# Patient Record
Sex: Female | Born: 1940
Health system: Southern US, Community
[De-identification: ages and names within clinical notes are randomized; demographics above are authoritative.]

## PROBLEM LIST (undated history)

## (undated) DIAGNOSIS — C189 Malignant neoplasm of colon, unspecified: Secondary | ICD-10-CM

## (undated) DIAGNOSIS — R6510 Systemic inflammatory response syndrome (SIRS) of non-infectious origin without acute organ dysfunction: Secondary | ICD-10-CM

## (undated) DIAGNOSIS — I10 Essential (primary) hypertension: Secondary | ICD-10-CM

## (undated) DIAGNOSIS — I82409 Acute embolism and thrombosis of unspecified deep veins of unspecified lower extremity: Secondary | ICD-10-CM

## (undated) DIAGNOSIS — M1990 Unspecified osteoarthritis, unspecified site: Secondary | ICD-10-CM

## (undated) DIAGNOSIS — E785 Hyperlipidemia, unspecified: Secondary | ICD-10-CM

## (undated) DIAGNOSIS — I639 Cerebral infarction, unspecified: Secondary | ICD-10-CM

## (undated) DIAGNOSIS — E119 Type 2 diabetes mellitus without complications: Secondary | ICD-10-CM

## (undated) DIAGNOSIS — N390 Urinary tract infection, site not specified: Secondary | ICD-10-CM

## (undated) HISTORY — PX: COLONOSCOPY: SHX174

## (undated) HISTORY — PX: ABDOMINAL HYSTERECTOMY: SHX81

## (undated) HISTORY — PX: COLON SURGERY: SHX602

## (undated) HISTORY — PX: APPENDECTOMY: SHX54

## (undated) HISTORY — DX: Malignant neoplasm of colon, unspecified: C18.9

## (undated) HISTORY — DX: Unspecified osteoarthritis, unspecified site: M19.90

## (undated) HISTORY — PX: LEG SURGERY: SHX1003

## (undated) HISTORY — PX: CATARACT EXTRACTION: SUR2

---

## 1998-10-02 ENCOUNTER — Other Ambulatory Visit: Admission: RE | Admit: 1998-10-02 | Discharge: 1998-10-02 | Payer: Self-pay | Admitting: Gynecology

## 1998-10-31 ENCOUNTER — Emergency Department (HOSPITAL_COMMUNITY): Admission: EM | Admit: 1998-10-31 | Discharge: 1998-10-31 | Payer: Self-pay | Admitting: Emergency Medicine

## 2000-06-04 ENCOUNTER — Encounter: Admission: RE | Admit: 2000-06-04 | Discharge: 2000-06-04 | Payer: Self-pay | Admitting: Internal Medicine

## 2000-06-04 ENCOUNTER — Encounter: Payer: Self-pay | Admitting: Internal Medicine

## 2001-08-04 ENCOUNTER — Encounter: Payer: Self-pay | Admitting: Internal Medicine

## 2001-08-04 ENCOUNTER — Encounter: Admission: RE | Admit: 2001-08-04 | Discharge: 2001-08-04 | Payer: Self-pay | Admitting: Internal Medicine

## 2001-09-14 ENCOUNTER — Other Ambulatory Visit: Admission: RE | Admit: 2001-09-14 | Discharge: 2001-09-14 | Payer: Self-pay | Admitting: Obstetrics and Gynecology

## 2003-03-09 ENCOUNTER — Encounter: Admission: RE | Admit: 2003-03-09 | Discharge: 2003-03-09 | Payer: Self-pay | Admitting: Obstetrics and Gynecology

## 2003-03-09 ENCOUNTER — Encounter: Payer: Self-pay | Admitting: Internal Medicine

## 2004-03-22 ENCOUNTER — Other Ambulatory Visit: Admission: RE | Admit: 2004-03-22 | Discharge: 2004-03-22 | Payer: Self-pay | Admitting: Obstetrics and Gynecology

## 2004-06-19 ENCOUNTER — Encounter: Admission: RE | Admit: 2004-06-19 | Discharge: 2004-06-19 | Payer: Self-pay | Admitting: Obstetrics and Gynecology

## 2005-07-07 ENCOUNTER — Emergency Department (HOSPITAL_COMMUNITY): Admission: EM | Admit: 2005-07-07 | Discharge: 2005-07-07 | Payer: Self-pay | Admitting: Emergency Medicine

## 2005-07-19 ENCOUNTER — Encounter: Admission: RE | Admit: 2005-07-19 | Discharge: 2005-07-19 | Payer: Self-pay | Admitting: *Deleted

## 2005-07-23 ENCOUNTER — Other Ambulatory Visit: Admission: RE | Admit: 2005-07-23 | Discharge: 2005-07-23 | Payer: Self-pay | Admitting: Obstetrics and Gynecology

## 2005-07-23 ENCOUNTER — Encounter: Admission: RE | Admit: 2005-07-23 | Discharge: 2005-07-23 | Payer: Self-pay | Admitting: Obstetrics and Gynecology

## 2006-09-05 ENCOUNTER — Other Ambulatory Visit: Admission: RE | Admit: 2006-09-05 | Discharge: 2006-09-05 | Payer: Self-pay | Admitting: Obstetrics and Gynecology

## 2006-09-05 ENCOUNTER — Encounter: Admission: RE | Admit: 2006-09-05 | Discharge: 2006-09-05 | Payer: Self-pay | Admitting: Obstetrics and Gynecology

## 2007-10-07 ENCOUNTER — Encounter: Admission: RE | Admit: 2007-10-07 | Discharge: 2007-10-07 | Payer: Self-pay | Admitting: Obstetrics and Gynecology

## 2007-11-12 ENCOUNTER — Other Ambulatory Visit: Admission: RE | Admit: 2007-11-12 | Discharge: 2007-11-12 | Payer: Self-pay | Admitting: Obstetrics and Gynecology

## 2007-11-26 DIAGNOSIS — C189 Malignant neoplasm of colon, unspecified: Secondary | ICD-10-CM

## 2007-11-26 HISTORY — DX: Malignant neoplasm of colon, unspecified: C18.9

## 2007-11-26 HISTORY — PX: COLON RESECTION: SHX5231

## 2008-08-04 ENCOUNTER — Encounter: Admission: RE | Admit: 2008-08-04 | Discharge: 2008-08-04 | Payer: Self-pay | Admitting: Gastroenterology

## 2008-08-13 ENCOUNTER — Encounter: Admission: RE | Admit: 2008-08-13 | Discharge: 2008-08-13 | Payer: Self-pay | Admitting: Surgery

## 2008-09-22 ENCOUNTER — Encounter (INDEPENDENT_AMBULATORY_CARE_PROVIDER_SITE_OTHER): Payer: Self-pay | Admitting: Surgery

## 2008-09-22 ENCOUNTER — Inpatient Hospital Stay (HOSPITAL_COMMUNITY): Admission: RE | Admit: 2008-09-22 | Discharge: 2008-09-25 | Payer: Self-pay | Admitting: Surgery

## 2008-10-05 ENCOUNTER — Ambulatory Visit: Payer: Self-pay | Admitting: Hematology and Oncology

## 2008-10-11 LAB — CBC WITH DIFFERENTIAL/PLATELET
BASO%: 0.9 % (ref 0.0–2.0)
HCT: 30.2 % — ABNORMAL LOW (ref 34.8–46.6)
LYMPH%: 33.4 % (ref 14.0–48.0)
MCH: 24.4 pg — ABNORMAL LOW (ref 26.0–34.0)
MCHC: 32.6 g/dL (ref 32.0–36.0)
MCV: 74.8 fL — ABNORMAL LOW (ref 81.0–101.0)
MONO#: 0.3 10*3/uL (ref 0.1–0.9)
NEUT%: 54.9 % (ref 39.6–76.8)
Platelets: 450 10*3/uL — ABNORMAL HIGH (ref 145–400)
WBC: 4.7 10*3/uL (ref 3.9–10.0)

## 2008-10-12 LAB — COMPREHENSIVE METABOLIC PANEL
Alkaline Phosphatase: 59 U/L (ref 39–117)
BUN: 8 mg/dL (ref 6–23)
CO2: 23 mEq/L (ref 19–32)
Creatinine, Ser: 0.69 mg/dL (ref 0.40–1.20)
Glucose, Bld: 93 mg/dL (ref 70–99)
Sodium: 140 mEq/L (ref 135–145)
Total Bilirubin: 0.4 mg/dL (ref 0.3–1.2)

## 2008-10-13 ENCOUNTER — Ambulatory Visit (HOSPITAL_COMMUNITY): Admission: RE | Admit: 2008-10-13 | Discharge: 2008-10-13 | Payer: Self-pay | Admitting: Hematology and Oncology

## 2008-10-25 ENCOUNTER — Ambulatory Visit (HOSPITAL_BASED_OUTPATIENT_CLINIC_OR_DEPARTMENT_OTHER): Admission: RE | Admit: 2008-10-25 | Discharge: 2008-10-25 | Payer: Self-pay | Admitting: Surgery

## 2008-11-09 LAB — CBC WITH DIFFERENTIAL/PLATELET
BASO%: 0.5 % (ref 0.0–2.0)
EOS%: 1.9 % (ref 0.0–7.0)
HCT: 30 % — ABNORMAL LOW (ref 34.8–46.6)
LYMPH%: 32 % (ref 14.0–48.0)
MCH: 25 pg — ABNORMAL LOW (ref 26.0–34.0)
MCHC: 32.6 g/dL (ref 32.0–36.0)
MONO%: 7.8 % (ref 0.0–13.0)
NEUT%: 57.8 % (ref 39.6–76.8)
Platelets: 239 10*3/uL (ref 145–400)
RBC: 3.91 10*6/uL (ref 3.70–5.32)

## 2008-11-09 LAB — COMPREHENSIVE METABOLIC PANEL
ALT: 8 U/L (ref 0–35)
AST: 9 U/L (ref 0–37)
Alkaline Phosphatase: 81 U/L (ref 39–117)
Creatinine, Ser: 0.74 mg/dL (ref 0.40–1.20)
Total Bilirubin: 0.6 mg/dL (ref 0.3–1.2)

## 2008-11-28 ENCOUNTER — Ambulatory Visit: Payer: Self-pay | Admitting: Hematology and Oncology

## 2008-11-30 LAB — COMPREHENSIVE METABOLIC PANEL
ALT: 11 U/L (ref 0–35)
AST: 13 U/L (ref 0–37)
Calcium: 9.9 mg/dL (ref 8.4–10.5)
Chloride: 107 mEq/L (ref 96–112)
Creatinine, Ser: 0.77 mg/dL (ref 0.40–1.20)
Potassium: 4.1 mEq/L (ref 3.5–5.3)
Sodium: 142 mEq/L (ref 135–145)
Total Protein: 6.8 g/dL (ref 6.0–8.3)

## 2008-11-30 LAB — CBC WITH DIFFERENTIAL/PLATELET
BASO%: 0.3 % (ref 0.0–2.0)
EOS%: 1.4 % (ref 0.0–7.0)
MCH: 26.8 pg (ref 26.0–34.0)
MCHC: 33.5 g/dL (ref 32.0–36.0)
RBC: 3.82 10*6/uL (ref 3.70–5.32)
RDW: 23.1 % — ABNORMAL HIGH (ref 11.3–14.5)
lymph#: 1.5 10*3/uL (ref 0.9–3.3)

## 2008-12-21 LAB — COMPREHENSIVE METABOLIC PANEL
ALT: 22 U/L (ref 0–35)
AST: 18 U/L (ref 0–37)
Chloride: 105 mEq/L (ref 96–112)
Creatinine, Ser: 0.66 mg/dL (ref 0.40–1.20)
Sodium: 141 mEq/L (ref 135–145)
Total Bilirubin: 0.8 mg/dL (ref 0.3–1.2)
Total Protein: 6.8 g/dL (ref 6.0–8.3)

## 2008-12-21 LAB — CBC WITH DIFFERENTIAL/PLATELET
BASO%: 0.4 % (ref 0.0–2.0)
EOS%: 1.8 % (ref 0.0–7.0)
HCT: 33 % — ABNORMAL LOW (ref 34.8–46.6)
MCH: 28.2 pg (ref 26.0–34.0)
MCHC: 33.5 g/dL (ref 32.0–36.0)
MONO#: 0.4 10*3/uL (ref 0.1–0.9)
NEUT%: 45.9 % (ref 39.6–76.8)
RBC: 3.92 10*6/uL (ref 3.70–5.32)
WBC: 3.4 10*3/uL — ABNORMAL LOW (ref 3.9–10.0)
lymph#: 1.4 10*3/uL (ref 0.9–3.3)

## 2009-01-11 LAB — CBC WITH DIFFERENTIAL/PLATELET
BASO%: 0.6 % (ref 0.0–2.0)
Basophils Absolute: 0 10*3/uL (ref 0.0–0.1)
EOS%: 1.6 % (ref 0.0–7.0)
HGB: 10.9 g/dL — ABNORMAL LOW (ref 11.6–15.9)
MCH: 29.4 pg (ref 26.0–34.0)
MCHC: 34 g/dL (ref 32.0–36.0)
MONO#: 0.4 10*3/uL (ref 0.1–0.9)
RDW: 27.1 % — ABNORMAL HIGH (ref 11.3–14.5)
WBC: 3.6 10*3/uL — ABNORMAL LOW (ref 3.9–10.0)
lymph#: 1.4 10*3/uL (ref 0.9–3.3)

## 2009-01-11 LAB — COMPREHENSIVE METABOLIC PANEL
ALT: 39 U/L — ABNORMAL HIGH (ref 0–35)
AST: 27 U/L (ref 0–37)
Albumin: 4.1 g/dL (ref 3.5–5.2)
CO2: 28 mEq/L (ref 19–32)
Calcium: 10.3 mg/dL (ref 8.4–10.5)
Chloride: 106 mEq/L (ref 96–112)
Potassium: 4.1 mEq/L (ref 3.5–5.3)

## 2009-01-30 ENCOUNTER — Ambulatory Visit: Payer: Self-pay | Admitting: Hematology and Oncology

## 2009-02-01 LAB — COMPREHENSIVE METABOLIC PANEL
AST: 79 U/L — ABNORMAL HIGH (ref 0–37)
Albumin: 4.3 g/dL (ref 3.5–5.2)
BUN: 11 mg/dL (ref 6–23)
Calcium: 10.1 mg/dL (ref 8.4–10.5)
Chloride: 104 mEq/L (ref 96–112)
Creatinine, Ser: 0.63 mg/dL (ref 0.40–1.20)
Glucose, Bld: 184 mg/dL — ABNORMAL HIGH (ref 70–99)
Potassium: 3.9 mEq/L (ref 3.5–5.3)

## 2009-02-01 LAB — CBC WITH DIFFERENTIAL/PLATELET
Basophils Absolute: 0 10*3/uL (ref 0.0–0.1)
EOS%: 2.1 % (ref 0.0–7.0)
Eosinophils Absolute: 0.1 10*3/uL (ref 0.0–0.5)
HCT: 34.6 % — ABNORMAL LOW (ref 34.8–46.6)
HGB: 11.7 g/dL (ref 11.6–15.9)
MCH: 30.4 pg (ref 25.1–34.0)
MCV: 89.5 fL (ref 79.5–101.0)
NEUT#: 1.7 10*3/uL (ref 1.5–6.5)
NEUT%: 45.2 % (ref 38.4–76.8)
RDW: 27.4 % — ABNORMAL HIGH (ref 11.2–14.5)
lymph#: 1.5 10*3/uL (ref 0.9–3.3)

## 2009-02-07 ENCOUNTER — Encounter: Admission: RE | Admit: 2009-02-07 | Discharge: 2009-02-07 | Payer: Self-pay | Admitting: Obstetrics and Gynecology

## 2009-02-22 LAB — COMPREHENSIVE METABOLIC PANEL
AST: 72 U/L — ABNORMAL HIGH (ref 0–37)
Albumin: 4.2 g/dL (ref 3.5–5.2)
BUN: 8 mg/dL (ref 6–23)
CO2: 28 mEq/L (ref 19–32)
Calcium: 10.2 mg/dL (ref 8.4–10.5)
Chloride: 107 mEq/L (ref 96–112)
Creatinine, Ser: 0.65 mg/dL (ref 0.40–1.20)
Potassium: 4.1 mEq/L (ref 3.5–5.3)

## 2009-02-22 LAB — CBC WITH DIFFERENTIAL/PLATELET
Basophils Absolute: 0 10*3/uL (ref 0.0–0.1)
EOS%: 2.2 % (ref 0.0–7.0)
Eosinophils Absolute: 0.1 10*3/uL (ref 0.0–0.5)
HCT: 32.4 % — ABNORMAL LOW (ref 34.8–46.6)
HGB: 10.8 g/dL — ABNORMAL LOW (ref 11.6–15.9)
MCH: 30.6 pg (ref 25.1–34.0)
MONO#: 0.5 10*3/uL (ref 0.1–0.9)
NEUT#: 1.8 10*3/uL (ref 1.5–6.5)
NEUT%: 48 % (ref 38.4–76.8)
RDW: 24.9 % — ABNORMAL HIGH (ref 11.2–14.5)
WBC: 3.7 10*3/uL — ABNORMAL LOW (ref 3.9–10.3)
lymph#: 1.4 10*3/uL (ref 0.9–3.3)

## 2009-03-10 LAB — CBC WITH DIFFERENTIAL/PLATELET
Basophils Absolute: 0 10*3/uL (ref 0.0–0.1)
EOS%: 1.3 % (ref 0.0–7.0)
Eosinophils Absolute: 0 10*3/uL (ref 0.0–0.5)
HCT: 32.1 % — ABNORMAL LOW (ref 34.8–46.6)
HGB: 10.9 g/dL — ABNORMAL LOW (ref 11.6–15.9)
MCH: 32.2 pg (ref 25.1–34.0)
MCV: 94.4 fL (ref 79.5–101.0)
MONO%: 10.9 % (ref 0.0–14.0)
NEUT%: 47.6 % (ref 38.4–76.8)
Platelets: 163 10*3/uL (ref 145–400)

## 2009-03-10 LAB — COMPREHENSIVE METABOLIC PANEL
AST: 32 U/L (ref 0–37)
Alkaline Phosphatase: 83 U/L (ref 39–117)
BUN: 8 mg/dL (ref 6–23)
Calcium: 10.6 mg/dL — ABNORMAL HIGH (ref 8.4–10.5)
Creatinine, Ser: 0.65 mg/dL (ref 0.40–1.20)
Glucose, Bld: 155 mg/dL — ABNORMAL HIGH (ref 70–99)

## 2009-04-03 ENCOUNTER — Ambulatory Visit: Payer: Self-pay | Admitting: Hematology and Oncology

## 2009-04-05 LAB — CBC WITH DIFFERENTIAL/PLATELET
Basophils Absolute: 0 10*3/uL (ref 0.0–0.1)
EOS%: 1.2 % (ref 0.0–7.0)
HGB: 11.5 g/dL — ABNORMAL LOW (ref 11.6–15.9)
LYMPH%: 38.8 % (ref 14.0–49.7)
MCH: 33.5 pg (ref 25.1–34.0)
MCV: 97.5 fL (ref 79.5–101.0)
MONO%: 13.3 % (ref 0.0–14.0)
NEUT%: 46.4 % (ref 38.4–76.8)
RDW: 20.9 % — ABNORMAL HIGH (ref 11.2–14.5)

## 2009-04-05 LAB — COMPREHENSIVE METABOLIC PANEL
AST: 30 U/L (ref 0–37)
Alkaline Phosphatase: 107 U/L (ref 39–117)
BUN: 11 mg/dL (ref 6–23)
Creatinine, Ser: 0.67 mg/dL (ref 0.40–1.20)
Potassium: 3.8 mEq/L (ref 3.5–5.3)
Total Bilirubin: 0.6 mg/dL (ref 0.3–1.2)

## 2009-05-03 ENCOUNTER — Ambulatory Visit (HOSPITAL_COMMUNITY): Admission: RE | Admit: 2009-05-03 | Discharge: 2009-05-03 | Payer: Self-pay | Admitting: Hematology and Oncology

## 2009-05-03 LAB — CBC WITH DIFFERENTIAL/PLATELET
BASO%: 0.3 % (ref 0.0–2.0)
MCHC: 34.5 g/dL (ref 31.5–36.0)
MONO#: 0.4 10*3/uL (ref 0.1–0.9)
RBC: 3.48 10*6/uL — ABNORMAL LOW (ref 3.70–5.45)
WBC: 3.6 10*3/uL — ABNORMAL LOW (ref 3.9–10.3)
lymph#: 1.6 10*3/uL (ref 0.9–3.3)
nRBC: 0 % (ref 0–0)

## 2009-05-03 LAB — COMPREHENSIVE METABOLIC PANEL
ALT: 26 U/L (ref 0–35)
AST: 21 U/L (ref 0–37)
Albumin: 2.8 g/dL — ABNORMAL LOW (ref 3.5–5.2)
Calcium: 7 mg/dL — ABNORMAL LOW (ref 8.4–10.5)
Chloride: 118 mEq/L — ABNORMAL HIGH (ref 96–112)
Creatinine, Ser: 0.45 mg/dL (ref 0.40–1.20)
Potassium: 2.9 mEq/L — ABNORMAL LOW (ref 3.5–5.3)

## 2009-06-13 ENCOUNTER — Other Ambulatory Visit: Admission: RE | Admit: 2009-06-13 | Discharge: 2009-06-13 | Payer: Self-pay | Admitting: Obstetrics and Gynecology

## 2009-06-27 ENCOUNTER — Ambulatory Visit: Payer: Self-pay | Admitting: Hematology and Oncology

## 2009-08-22 ENCOUNTER — Ambulatory Visit: Payer: Self-pay | Admitting: Hematology and Oncology

## 2009-08-24 LAB — CBC WITH DIFFERENTIAL/PLATELET
Eosinophils Absolute: 0.1 10*3/uL (ref 0.0–0.5)
HGB: 12.3 g/dL (ref 11.6–15.9)
LYMPH%: 35.7 % (ref 14.0–49.7)
MONO#: 0.3 10*3/uL (ref 0.1–0.9)
NEUT#: 2.5 10*3/uL (ref 1.5–6.5)
Platelets: 160 10*3/uL (ref 145–400)
RBC: 4.14 10*6/uL (ref 3.70–5.45)
RDW: 12.9 % (ref 11.2–14.5)
WBC: 4.5 10*3/uL (ref 3.9–10.3)

## 2009-08-24 LAB — CEA: CEA: 2.6 ng/mL (ref 0.0–5.0)

## 2009-08-24 LAB — COMPREHENSIVE METABOLIC PANEL
Albumin: 4.2 g/dL (ref 3.5–5.2)
CO2: 25 mEq/L (ref 19–32)
Glucose, Bld: 241 mg/dL — ABNORMAL HIGH (ref 70–99)
Potassium: 4 mEq/L (ref 3.5–5.3)
Sodium: 138 mEq/L (ref 135–145)
Total Protein: 6.8 g/dL (ref 6.0–8.3)

## 2009-08-31 ENCOUNTER — Ambulatory Visit (HOSPITAL_COMMUNITY): Admission: RE | Admit: 2009-08-31 | Discharge: 2009-08-31 | Payer: Self-pay | Admitting: Hematology and Oncology

## 2009-10-03 ENCOUNTER — Ambulatory Visit: Payer: Self-pay | Admitting: Hematology and Oncology

## 2009-11-14 ENCOUNTER — Ambulatory Visit: Payer: Self-pay | Admitting: Oncology

## 2009-12-04 ENCOUNTER — Ambulatory Visit (HOSPITAL_COMMUNITY): Admission: RE | Admit: 2009-12-04 | Discharge: 2009-12-04 | Payer: Self-pay | Admitting: Hematology and Oncology

## 2009-12-04 LAB — CBC WITH DIFFERENTIAL/PLATELET
Eosinophils Absolute: 0.1 10*3/uL (ref 0.0–0.5)
MCH: 30.3 pg (ref 25.1–34.0)
MONO#: 0.3 10*3/uL (ref 0.1–0.9)
Platelets: 193 10*3/uL (ref 145–400)
WBC: 4.7 10*3/uL (ref 3.9–10.3)
lymph#: 1.6 10*3/uL (ref 0.9–3.3)

## 2009-12-04 LAB — COMPREHENSIVE METABOLIC PANEL
ALT: 22 U/L (ref 0–35)
AST: 22 U/L (ref 0–37)
Albumin: 3.9 g/dL (ref 3.5–5.2)
Alkaline Phosphatase: 118 U/L — ABNORMAL HIGH (ref 39–117)
Calcium: 10.3 mg/dL (ref 8.4–10.5)
Chloride: 102 mEq/L (ref 96–112)
Creatinine, Ser: 0.69 mg/dL (ref 0.40–1.20)
Glucose, Bld: 287 mg/dL — ABNORMAL HIGH (ref 70–99)
Potassium: 4.1 mEq/L (ref 3.5–5.3)
Sodium: 137 mEq/L (ref 135–145)
Total Bilirubin: 0.8 mg/dL (ref 0.3–1.2)

## 2009-12-04 LAB — CEA: CEA: 3.2 ng/mL (ref 0.0–5.0)

## 2010-01-15 ENCOUNTER — Ambulatory Visit: Payer: Self-pay | Admitting: Hematology and Oncology

## 2010-02-26 ENCOUNTER — Ambulatory Visit: Payer: Self-pay | Admitting: Hematology and Oncology

## 2010-03-20 LAB — CBC WITH DIFFERENTIAL/PLATELET
Basophils Absolute: 0 10*3/uL (ref 0.0–0.1)
Eosinophils Absolute: 0.1 10*3/uL (ref 0.0–0.5)
HGB: 12.1 g/dL (ref 11.6–15.9)
MCV: 89.3 fL (ref 79.5–101.0)
MONO#: 0.5 10*3/uL (ref 0.1–0.9)
MONO%: 9.3 % (ref 0.0–14.0)
NEUT#: 3.1 10*3/uL (ref 1.5–6.5)
RBC: 3.97 10*6/uL (ref 3.70–5.45)
RDW: 14.2 % (ref 11.2–14.5)
WBC: 5.1 10*3/uL (ref 3.9–10.3)

## 2010-03-20 LAB — COMPREHENSIVE METABOLIC PANEL
BUN: 15 mg/dL (ref 6–23)
CO2: 27 mEq/L (ref 19–32)
Chloride: 105 mEq/L (ref 96–112)
Glucose, Bld: 107 mg/dL — ABNORMAL HIGH (ref 70–99)
Potassium: 4.3 mEq/L (ref 3.5–5.3)
Sodium: 139 mEq/L (ref 135–145)
Total Protein: 7 g/dL (ref 6.0–8.3)

## 2010-04-10 ENCOUNTER — Ambulatory Visit: Payer: Self-pay | Admitting: Hematology and Oncology

## 2010-04-12 ENCOUNTER — Encounter: Admission: RE | Admit: 2010-04-12 | Discharge: 2010-04-12 | Payer: Self-pay | Admitting: Obstetrics and Gynecology

## 2010-06-06 ENCOUNTER — Ambulatory Visit: Payer: Self-pay | Admitting: Hematology and Oncology

## 2010-07-26 ENCOUNTER — Ambulatory Visit: Payer: Self-pay | Admitting: Hematology and Oncology

## 2010-07-31 ENCOUNTER — Ambulatory Visit (HOSPITAL_COMMUNITY): Admission: RE | Admit: 2010-07-31 | Discharge: 2010-07-31 | Payer: Self-pay | Admitting: Hematology and Oncology

## 2010-07-31 LAB — CBC WITH DIFFERENTIAL/PLATELET
EOS%: 1.7 % (ref 0.0–7.0)
Eosinophils Absolute: 0.1 10*3/uL (ref 0.0–0.5)
HCT: 38.1 % (ref 34.8–46.6)
MCH: 29 pg (ref 25.1–34.0)
MONO%: 6.9 % (ref 0.0–14.0)
NEUT%: 59.8 % (ref 38.4–76.8)
Platelets: 214 10*3/uL (ref 145–400)
WBC: 5.1 10*3/uL (ref 3.9–10.3)
lymph#: 1.6 10*3/uL (ref 0.9–3.3)

## 2010-07-31 LAB — COMPREHENSIVE METABOLIC PANEL
ALT: 19 U/L (ref 0–35)
AST: 20 U/L (ref 0–37)
Albumin: 3.9 g/dL (ref 3.5–5.2)
CO2: 29 mEq/L (ref 19–32)
Chloride: 100 mEq/L (ref 96–112)
Sodium: 135 mEq/L (ref 135–145)
Total Bilirubin: 0.9 mg/dL (ref 0.3–1.2)

## 2010-08-27 ENCOUNTER — Ambulatory Visit (HOSPITAL_COMMUNITY): Admission: RE | Admit: 2010-08-27 | Discharge: 2010-08-27 | Payer: Self-pay | Admitting: Surgery

## 2010-12-16 ENCOUNTER — Encounter: Payer: Self-pay | Admitting: Hematology and Oncology

## 2010-12-16 ENCOUNTER — Encounter: Payer: Self-pay | Admitting: Obstetrics and Gynecology

## 2011-01-30 ENCOUNTER — Encounter (HOSPITAL_BASED_OUTPATIENT_CLINIC_OR_DEPARTMENT_OTHER): Payer: Medicare Other | Admitting: Hematology and Oncology

## 2011-01-30 ENCOUNTER — Other Ambulatory Visit: Payer: Self-pay | Admitting: Hematology and Oncology

## 2011-01-30 DIAGNOSIS — Z452 Encounter for adjustment and management of vascular access device: Secondary | ICD-10-CM

## 2011-01-30 DIAGNOSIS — C187 Malignant neoplasm of sigmoid colon: Secondary | ICD-10-CM

## 2011-01-30 LAB — CBC WITH DIFFERENTIAL/PLATELET
Basophils Absolute: 0.1 10*3/uL (ref 0.0–0.1)
EOS%: 0.9 % (ref 0.0–7.0)
HCT: 37.2 % (ref 34.8–46.6)
HGB: 12.7 g/dL (ref 11.6–15.9)
MCH: 29.3 pg (ref 25.1–34.0)
MCV: 85.8 fL (ref 79.5–101.0)
MONO%: 6.6 % (ref 0.0–14.0)
NEUT%: 60.8 % (ref 38.4–76.8)

## 2011-01-30 LAB — COMPREHENSIVE METABOLIC PANEL
AST: 18 U/L (ref 0–37)
Alkaline Phosphatase: 87 U/L (ref 39–117)
BUN: 12 mg/dL (ref 6–23)
Calcium: 10.5 mg/dL (ref 8.4–10.5)
Chloride: 105 mEq/L (ref 96–112)
Creatinine, Ser: 1.02 mg/dL (ref 0.40–1.20)
Glucose, Bld: 114 mg/dL — ABNORMAL HIGH (ref 70–99)

## 2011-02-06 ENCOUNTER — Other Ambulatory Visit: Payer: Self-pay | Admitting: Hematology and Oncology

## 2011-02-06 ENCOUNTER — Encounter (HOSPITAL_BASED_OUTPATIENT_CLINIC_OR_DEPARTMENT_OTHER): Payer: Medicare Other | Admitting: Hematology and Oncology

## 2011-02-06 DIAGNOSIS — C189 Malignant neoplasm of colon, unspecified: Secondary | ICD-10-CM

## 2011-02-06 DIAGNOSIS — C187 Malignant neoplasm of sigmoid colon: Secondary | ICD-10-CM

## 2011-02-06 LAB — GLUCOSE, CAPILLARY: Glucose-Capillary: 270 mg/dL — ABNORMAL HIGH (ref 70–99)

## 2011-02-07 LAB — BASIC METABOLIC PANEL
Chloride: 105 mEq/L (ref 96–112)
Creatinine, Ser: 0.69 mg/dL (ref 0.4–1.2)
GFR calc Af Amer: >60 mL/min (ref >60)
Potassium: 3.8 mEq/L (ref 3.5–5.1)
Sodium: 137 mEq/L (ref 135–145)

## 2011-02-07 LAB — CBC
HCT: 38.6 % (ref 36.0–46.0)
Hemoglobin: 13 g/dL (ref 12.0–15.0)
MCV: 86 fL (ref 78.0–100.0)
Platelets: 185 10*3/uL (ref 150–400)
RBC: 4.49 MIL/uL (ref 3.87–5.11)
WBC: 5.2 10*3/uL (ref 4.0–10.5)
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
## 2011-04-09 NOTE — Op Note (Signed)
April Franklin, April Franklin               ACCOUNT NO.:  [id number]   MEDICAL RECORD NO.:  [id number]          PATIENT TYPE:  AMB   LOCATION:  DSC                          FACILITY:  [facility]   PHYSICIAN:  Ardeth Sportsman, MD     DATE OF BIRTH:  11-26-1940   DATE OF PROCEDURE:  DATE OF DISCHARGE:                               OPERATIVE REPORT   PRIMARY CARE PHYSICIAN:  Dr. Earl Gala.   GASTROENTEROLOGIST:  Tasia Catchings, M.D.   MEDICAL ONCOLOGIST:  Vicente Serene I. Odogwu, MD   SURGEON:  Ardeth Sportsman, MD.   ASSISTANT:  None.   PREOPERATIVE DIAGNOSIS:  pT2pN0 and pT2pN1 synchronous colon cancers,  need for chemotherapy.   POSTOPERATIVE DIAGNOSIS:  pT2pN0 and pT2pN1 synchronous colon cancers,  need for chemotherapy.   PROCEDURE PERFORMED:  Placement of Port-A-Catheter (8.5 French left  internal jugular vein) using ultrasound and fluoroscopic guidance.   ANESTHESIA:  1. Moderate deep sedation.  2. Local anesthetic and a field block.   SPECIMENS:  None.   DRAINS:  None.   ESTIMATED BLOOD LOSS:  3 mL.   COMPLICATIONS:  None apparent.   INDICATIONS:  April Franklin is a 70 year old female who was found to have 2  colon cancers in her ascending and sigmoid colon.  Sigmoid colon was  node-positive.  She saw Dr. Dalene Carrow, who thought she would benefit from  chemotherapy.  Therefore recommendation was made for placement of Port-A-  Catheter.   The anatomy and physiology of the central vein was discussed.  Technique  of placement was discussed.  Risks, benefits, and alternatives were  discussed.  Questions answered and she agreed to proceed with Port-A-  Catheter placement.   OPERATIVE FINDINGS:  No obvious apparent anatomy discrepancy.   PROCEDURE:  Informed consent was confirmed.  The patient received IV  antibiotics as per her surgery.  She voided just prior to coming to the  operating room.  She was positioned supine, with both arms tucked.  She  had a roll placed between her shoulder  blades.  Her bilateral neck and  chest were prepped and draped in sterile fashion.  She was placed on  moderate sedation.  Surgical time-out was performed.   Left internal jugular vein was easily identified by ultrasonic guidance  with the patient in moderate Trendelenburg position.  Venipuncture was  made on the first attempt.  Wire initially did not pass, but after  redirecting the needle more medially, the wire passed very easily.  There was some ectopy.  Fluoroscopy confirmed the tip was in the right  ventricle.  It was pulled back and advanced into the inferior vena cava.   An infraclavicular pocket was made in the left lateral clavicular line  using a 2-cm transverse incision.  A 8.5 French port was flushed.  It  was tunneled from the chest wound to the neck puncture site.  The port  was secured to the pectoralis fascia on the left chest through the wound  using interrupted Prolene stitch x4.  It flushed well.   A dilator and sheath were placed.  The wire was pulled  back, so the tip  was in the distal superior vena cava.  Intravenous length of this  measured while pulling the wire back from the SVC/RA junction all the  way up to the venipuncture site.  The catheter was cut to appropriate  length.  Wire was readvanced back to the IVC under fluoroscopy.   The wire and dilator were removed and a catheter was placed inside the  sheath and advanced very easily.  Peel away sheath was pulled back.  Fluoroscopy revealed the tip in SVC/right atrial junction.  Hemostasis  was excellent.  The wounds were closed using deep dermal Vicryl stitches  and subcuticular interrupted 4-0 Monocryl stitch.  The catheter  aspirated and flushed well x2.  The final flush of concentrated hepairn  to 2 mL  was used.  Sterile dressing was applied.   The patient was sent to recovery room in stable condition.  Chest x-ray  was pending.  I discussed postoperative care with the patient and her  husband and  daughter as well and will reaffirm with them now.      Ardeth Sportsman, MD  Electronically Signed     SCG/MEDQ  D:  10/25/2008  T:  10/26/2008  Job:  [id number]   cc:   Vicente Serene I. Odogwu, M.D.

## 2011-04-09 NOTE — Discharge Summary (Signed)
April Franklin, April Franklin               ACCOUNT NO.:  [id number]   MEDICAL RECORD NO.:  [id number]          PATIENT TYPE:  INP   LOCATION:  1539                         [facility]ACILITY[facility]   PHYSICIAN:  Ardeth Sportsman, MD     DATE OF BIRTH:  [birth year]   DATE OF ADMISSION:  09/22/2008  DATE OF DISCHARGE:  09/25/2008                               DISCHARGE SUMMARY   SURGEON:  Ardeth Sportsman, MD.   PRIMARY CARE PHYSICIANS:  1. Ellwood Dense, M.D.  2. Theressa Millard, M.D.   GASTROENTEROLOGIST:  Tasia Catchings, M.D.   DIAGNOSES:  1. 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  2. 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  3. Sigmoid colon cancer (pt2pN1M0, 1 of 13 lymph nodes positive).      Total lymph nodes 1 of 52.   PROCEDURES PERFORMED:  Laparoscopically assisted abdominal colectomy  with ileorectal anastomosis on September 22, 2008.   HOSPITAL COURSE:  April Franklin is a 70 year old female who was found to  have numerous colon lesions.  Based on concern, recommendation was made  for abdominal colectomy.  She underwent this.  Postoperatively, she was  placed on IV fluids and IV pain medication.  She had resolution of her  ileus.  By the time of discharge, was walking in the hallways with  adequate pain control while on oral pain medications.  She did have some  loose stools, but no severe diarrhea.  Basically, has improved and  thought it would be reasonable to discharge home with the following  instructions:   DISCHARGE INSTRUCTIONS:  1. She is to return to see me in about 2 weeks.  2. She should call if she has any fevers, chills, sweats, nausea,      vomiting, worsening pain or other concerns.  3. She should call if she has worsening drainage from her incision,      tenderness or redness around the area.  4. She should resume her home medications which are listed in the      chart.  5. She should take oxycodone 5-10  mg p.o. q.4 h. p.r.n. pain along      with nonsteroidal p.r.n. or Tylenol p.r.n.  6. She can do ice pack or heating pad p.r.n. pain as well.      Ardeth Sportsman, MD  Electronically Signed     SCG/MEDQ  D:  09/27/2008  T:  09/27/2008  Job:  [id number]   cc:   Ardeth Sportsman, MD  [address] [place] [address]  [facility] [place]   Ellwood Dense, M.D.  Fax: [phone]   Theressa Millard, M.D.  Fax: [phone]   Tasia Catchings, M.D.  Fax: [phone]

## 2011-05-03 ENCOUNTER — Other Ambulatory Visit: Payer: Self-pay | Admitting: Obstetrics and Gynecology

## 2011-05-03 DIAGNOSIS — Z1231 Encounter for screening mammogram for malignant neoplasm of breast: Secondary | ICD-10-CM

## 2011-05-30 ENCOUNTER — Ambulatory Visit: Payer: Medicare Other

## 2011-06-14 ENCOUNTER — Ambulatory Visit
Admission: RE | Admit: 2011-06-14 | Discharge: 2011-06-14 | Disposition: A | Discharge status: Home / Self Care | Payer: Medicare Other | Source: Ambulatory Visit | Attending: Obstetrics and Gynecology | Admitting: Obstetrics and Gynecology

## 2011-06-14 ENCOUNTER — Ambulatory Visit: Payer: Medicare Other

## 2011-06-14 DIAGNOSIS — Z1231 Encounter for screening mammogram for malignant neoplasm of breast: Secondary | ICD-10-CM

## 2011-08-06 ENCOUNTER — Other Ambulatory Visit: Payer: Self-pay | Admitting: Hematology and Oncology

## 2011-08-06 ENCOUNTER — Ambulatory Visit (HOSPITAL_COMMUNITY)
Admission: RE | Admit: 2011-08-06 | Discharge: 2011-08-06 | Disposition: A | Discharge status: Home / Self Care | Payer: Medicare Other | Source: Ambulatory Visit | Attending: Hematology and Oncology | Admitting: Hematology and Oncology

## 2011-08-06 ENCOUNTER — Encounter (HOSPITAL_BASED_OUTPATIENT_CLINIC_OR_DEPARTMENT_OTHER): Payer: Medicare Other | Admitting: Hematology and Oncology

## 2011-08-06 DIAGNOSIS — K7689 Other specified diseases of liver: Secondary | ICD-10-CM | POA: Insufficient documentation

## 2011-08-06 DIAGNOSIS — K8689 Other specified diseases of pancreas: Secondary | ICD-10-CM | POA: Insufficient documentation

## 2011-08-06 DIAGNOSIS — C189 Malignant neoplasm of colon, unspecified: Secondary | ICD-10-CM | POA: Insufficient documentation

## 2011-08-06 DIAGNOSIS — C187 Malignant neoplasm of sigmoid colon: Secondary | ICD-10-CM

## 2011-08-06 LAB — CBC WITH DIFFERENTIAL/PLATELET
Basophils Absolute: 0 10*3/uL (ref 0.0–0.1)
Eosinophils Absolute: 0.1 10*3/uL (ref 0.0–0.5)
HCT: 36.7 % (ref 34.8–46.6)
HGB: 12.7 g/dL (ref 11.6–15.9)
LYMPH%: 30.9 % (ref 14.0–49.7)
MCV: 86.3 fL (ref 79.5–101.0)
MONO#: 0.3 10*3/uL (ref 0.1–0.9)
MONO%: 5.8 % (ref 0.0–14.0)
NEUT#: 2.9 10*3/uL (ref 1.5–6.5)
Platelets: 218 10*3/uL (ref 145–400)
WBC: 4.7 10*3/uL (ref 3.9–10.3)

## 2011-08-06 LAB — CMP (CANCER CENTER ONLY)
ALT(SGPT): 25 U/L (ref 10–47)
AST: 23 U/L (ref 11–38)
BUN, Bld: 10 mg/dL (ref 7–22)
CO2: 26 mEq/L (ref 18–33)
Calcium: 10 mg/dL (ref 8.0–10.3)
Creat: 0.7 mg/dl (ref 0.6–1.2)
Glucose, Bld: 222 mg/dL — ABNORMAL HIGH (ref 73–118)
Potassium: 4.1 mEq/L (ref 3.3–4.7)
Sodium: 140 mEq/L (ref 128–145)

## 2011-08-06 MED ORDER — IOHEXOL 300 MG/ML  SOLN
100.0000 mL | Freq: Once | INTRAMUSCULAR | Status: AC | PRN
  Administered 2011-08-06: 100 mL via INTRAVENOUS

## 2011-08-20 ENCOUNTER — Encounter (HOSPITAL_BASED_OUTPATIENT_CLINIC_OR_DEPARTMENT_OTHER): Payer: Medicare Other | Admitting: Hematology and Oncology

## 2011-08-20 DIAGNOSIS — Z85038 Personal history of other malignant neoplasm of large intestine: Secondary | ICD-10-CM

## 2011-08-27 LAB — CBC
HCT: 26.2 — ABNORMAL LOW
Hemoglobin: 8.6 — ABNORMAL LOW
MCHC: 32
MCHC: 32.1
MCV: 75.5 — ABNORMAL LOW
Platelets: 249
Platelets: 290
RBC: 3.48 — ABNORMAL LOW
RBC: 4.01
RDW: 16.4 — ABNORMAL HIGH
RDW: 16.6 — ABNORMAL HIGH
WBC: 7.1

## 2011-08-27 LAB — BASIC METABOLIC PANEL
BUN: 7
Calcium: 10.1 mg/dL (ref 8.4–10.5)
Calcium: 9.8
Creatinine, Ser: 0.63 mg/dL (ref 0.4–1.2)
GFR calc Af Amer: >60 mL/min (ref >60)
GFR calc non Af Amer: >60 mL/min (ref >60)
GFR calc non Af Amer: >60
Potassium: 4.1
Sodium: 141 mEq/L (ref 135–145)
Sodium: 138

## 2011-08-27 LAB — GLUCOSE, CAPILLARY
Glucose-Capillary: 157 — ABNORMAL HIGH
Glucose-Capillary: 169 — ABNORMAL HIGH
Glucose-Capillary: 177 — ABNORMAL HIGH
Glucose-Capillary: 78
Glucose-Capillary: 123 — ABNORMAL HIGH

## 2011-08-27 LAB — COMPREHENSIVE METABOLIC PANEL WITH GFR
ALT: 16
AST: 19
Albumin: 3.3 — ABNORMAL LOW
Alkaline Phosphatase: 67
BUN: 8
CO2: 27
Calcium: 10.2
Chloride: 107
Creatinine, Ser: 0.7
GFR calc non Af Amer: >60
Glucose, Bld: 197 — ABNORMAL HIGH
Potassium: 3.7
Sodium: 142
Total Bilirubin: 0.6
Total Protein: 6.6

## 2011-08-27 LAB — CREATININE, SERUM
Creatinine, Ser: 0.63
Creatinine, Ser: 0.73
GFR calc Af Amer: >60
GFR calc Af Amer: >60

## 2011-08-27 LAB — MAGNESIUM: Magnesium: 1.6

## 2011-08-27 LAB — PHOSPHORUS: Phosphorus: 2.8

## 2011-08-27 LAB — TYPE AND SCREEN: ABO/RH(D): B POS

## 2011-08-30 LAB — GLUCOSE, CAPILLARY: Glucose-Capillary: 101 mg/dL — ABNORMAL HIGH (ref 70–99)

## 2011-11-12 ENCOUNTER — Encounter: Payer: Self-pay | Admitting: Emergency Medicine

## 2011-11-12 ENCOUNTER — Inpatient Hospital Stay (HOSPITAL_COMMUNITY)
Admission: EM | Admit: 2011-11-12 | Discharge: 2011-11-12 | Disposition: A | Discharge status: Home / Self Care | Payer: No Typology Code available for payment source | Attending: Emergency Medicine | Admitting: Emergency Medicine

## 2011-11-12 ENCOUNTER — Other Ambulatory Visit (HOSPITAL_COMMUNITY): Payer: No Typology Code available for payment source

## 2011-11-12 DIAGNOSIS — I10 Essential (primary) hypertension: Secondary | ICD-10-CM | POA: Insufficient documentation

## 2011-11-12 DIAGNOSIS — S40019A Contusion of unspecified shoulder, initial encounter: Secondary | ICD-10-CM

## 2011-11-12 DIAGNOSIS — M25519 Pain in unspecified shoulder: Secondary | ICD-10-CM | POA: Insufficient documentation

## 2011-11-12 DIAGNOSIS — E119 Type 2 diabetes mellitus without complications: Secondary | ICD-10-CM | POA: Insufficient documentation

## 2011-11-12 HISTORY — DX: Essential (primary) hypertension: I10

## 2011-11-12 MED ORDER — HYDROCODONE-ACETAMINOPHEN 5-500 MG PO TABS: 1.0000 | ORAL_TABLET | Freq: Four times a day (QID) | ORAL | Status: AC | PRN

## 2011-11-12 MED ORDER — ACETAMINOPHEN 325 MG PO TABS
650.0000 mg | ORAL_TABLET | Freq: Once | ORAL | Status: AC
  Administered 2011-11-12: 650 mg via ORAL
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
## 2011-12-28 ENCOUNTER — Telehealth: Payer: Self-pay | Admitting: Hematology and Oncology

## 2011-12-28 NOTE — Telephone Encounter (Signed)
Talked to pt, gave her appt for March 26 lab draw and MD visit on 02/21/12

## 2012-02-18 ENCOUNTER — Other Ambulatory Visit (HOSPITAL_BASED_OUTPATIENT_CLINIC_OR_DEPARTMENT_OTHER): Payer: Medicare Other | Admitting: Lab

## 2012-02-18 DIAGNOSIS — C187 Malignant neoplasm of sigmoid colon: Secondary | ICD-10-CM | POA: Diagnosis not present

## 2012-02-18 LAB — COMPREHENSIVE METABOLIC PANEL
ALT: 20 U/L (ref 0–35)
AST: 15 U/L (ref 0–37)
Albumin: 4.2 g/dL (ref 3.5–5.2)
Alkaline Phosphatase: 95 U/L (ref 39–117)
Calcium: 10.5 mg/dL (ref 8.4–10.5)
Chloride: 102 mEq/L (ref 96–112)
Creatinine, Ser: 0.77 mg/dL (ref 0.50–1.10)
Potassium: 3.8 mEq/L (ref 3.5–5.3)

## 2012-02-18 LAB — CBC WITH DIFFERENTIAL/PLATELET
BASO%: 0.2 % (ref 0.0–2.0)
EOS%: 1 % (ref 0.0–7.0)
MCH: 29.4 pg (ref 25.1–34.0)
MCHC: 33.6 g/dL (ref 31.5–36.0)
MONO%: 6.6 % (ref 0.0–14.0)
RDW: 13.2 % (ref 11.2–14.5)
lymph#: 1.7 10*3/uL (ref 0.9–3.3)

## 2012-02-21 ENCOUNTER — Ambulatory Visit (HOSPITAL_BASED_OUTPATIENT_CLINIC_OR_DEPARTMENT_OTHER): Payer: Medicare Other | Admitting: Hematology and Oncology

## 2012-02-21 ENCOUNTER — Telehealth: Payer: Self-pay | Admitting: Hematology and Oncology

## 2012-02-21 ENCOUNTER — Encounter: Payer: Self-pay | Admitting: Hematology and Oncology

## 2012-02-21 VITALS — BP 135/79 | HR 79 | Temp 97.0°F | Ht 63.0 in | Wt 202.0 lb

## 2012-02-21 DIAGNOSIS — Z9189 Other specified personal risk factors, not elsewhere classified: Secondary | ICD-10-CM | POA: Insufficient documentation

## 2012-02-21 DIAGNOSIS — C189 Malignant neoplasm of colon, unspecified: Secondary | ICD-10-CM | POA: Diagnosis not present

## 2012-02-21 DIAGNOSIS — I10 Essential (primary) hypertension: Secondary | ICD-10-CM | POA: Insufficient documentation

## 2012-02-21 NOTE — Progress Notes (Signed)
This office note has been dictated.

## 2012-02-21 NOTE — Telephone Encounter (Signed)
appts made and printed for pt aom °

## 2012-02-21 NOTE — Patient Instructions (Signed)
Patient to follow up as instructed.   Current Outpatient Prescriptions  Medication Sig Dispense Refill  . glipiZIDE (GLUCOTROL) 10 MG tablet Take 10 mg by mouth daily.      Marland Kitchen linagliptin (TRADJENTA) 5 MG TABS tablet Take 5 mg by mouth daily.      Marland Kitchen losartan-hydrochlorothiazide (HYZAAR) 50-12.5 MG per tablet Take 1 tablet by mouth daily.      . metFORMIN (GLUCOPHAGE) 1000 MG tablet Take 1,000 mg by mouth daily with breakfast.            March 2013  Sunday Monday Tuesday Wednesday Thursday Friday Saturday                  1   2    3   4   5   6   7   8   9    10   11   12   13   14   15   16    17   18   19   20   21   22   23    24   25   26    LAB MO     1:15 PM  (15 min.)  Mauri Brooklyn  [facility] 27   28   29    EST PT 30  11:00 AM  (30 min.)  Laurice Record, MD  [facility] 30    31

## 2012-02-21 NOTE — Progress Notes (Signed)
CC:   Theressa Millard, M.D. Danise Edge, M.D.  IDENTIFYING STATEMENT:  The patient is a 71 year old woman with colon cancer who presents for followup.  INTERVAL HISTORY:  Mrs. April Franklin was seen 6 months ago.  She notes ongoing tingling in her fingertips and toes.  She has diabetes.  Her weight is stable.  She has no nausea or vomiting.  She has not experienced any change in bowel function.  MEDICATIONS:  Reviewed and updated.  PHYSICAL EXAMINATION:  General:  Alert and oriented x3.  Vitals:  Pulse 79, blood pressure 135/77, temperature 97, respirations 20, weight 202 pounds.  HEENT:  Head is atraumatic, normocephalic.  Sclerae anicteric. Mouth moist.  Neck:  Supple.  Chest:  Clear to percussion and auscultation.  CVS:  First and second heart sounds present.  No added sounds or murmurs.  Abdomen:  Soft, nontender.  Bowel sounds present. Extremities:  No edema.  LABORATORY DATA:  02/18/2012:  White cell count 5, hemoglobin 12.6, hematocrit 37.6, platelets 211.  Sodium 140, potassium 3.8, chloride 102, CO2 30, BUN 9, creatinine 0.77, glucose 179.  T bili 0.7, alkaline phosphatase 95, AST 15, ALT 20.  CEA 2.7.  IMPRESSION AND PLAN:  Mrs. Strickland is a 71 year old woman who is status post laparoscopic-assisted colectomy with rectal anastomosis on 09/22/2008 for 2 synchronous tumors in the descending and sigmoid colon. She had node-positive disease.  She began adjuvant XELOX on November 06, 2008, and completed therapy on Apr 06, 2009.  She has no evidence of disease recurrence at this time.  She is due for colonoscopy and followup this year.  If all is well, she has a followup visit in 9 months with labs and CT scans.    ______________________________ Laurice Record, M.D. LIO/MEDQ  D:  02/21/2012  T:  02/21/2012  Job:  [id number]

## 2012-03-04 ENCOUNTER — Encounter: Payer: Self-pay | Admitting: Hematology and Oncology

## 2012-03-04 NOTE — Progress Notes (Signed)
Approved for 100% ind 03/04/12 - 03/04/13, family 2.

## 2012-03-10 ENCOUNTER — Telehealth: Payer: Self-pay | Admitting: Hematology and Oncology

## 2012-03-10 NOTE — Telephone Encounter (Signed)
l/m for nurse to set up colonoscopy  aom

## 2012-03-11 ENCOUNTER — Telehealth: Payer: Self-pay | Admitting: Hematology and Oncology

## 2012-03-11 NOTE — Telephone Encounter (Signed)
karen l./m 4/16 that she  would be calling the pt with her appt

## 2012-04-01 DIAGNOSIS — M79609 Pain in unspecified limb: Secondary | ICD-10-CM | POA: Diagnosis not present

## 2012-04-01 DIAGNOSIS — G609 Hereditary and idiopathic neuropathy, unspecified: Secondary | ICD-10-CM | POA: Diagnosis not present

## 2012-04-01 DIAGNOSIS — I10 Essential (primary) hypertension: Secondary | ICD-10-CM | POA: Diagnosis not present

## 2012-04-01 DIAGNOSIS — IMO0001 Reserved for inherently not codable concepts without codable children: Secondary | ICD-10-CM | POA: Diagnosis not present

## 2012-04-29 ENCOUNTER — Encounter (HOSPITAL_BASED_OUTPATIENT_CLINIC_OR_DEPARTMENT_OTHER): Payer: Self-pay | Admitting: Family Medicine

## 2012-04-29 ENCOUNTER — Emergency Department (HOSPITAL_BASED_OUTPATIENT_CLINIC_OR_DEPARTMENT_OTHER)
Admission: EM | Admit: 2012-04-29 | Discharge: 2012-04-29 | Disposition: A | Discharge status: Home / Self Care | Payer: Medicare Other | Attending: Emergency Medicine | Admitting: Emergency Medicine

## 2012-04-29 ENCOUNTER — Emergency Department (HOSPITAL_BASED_OUTPATIENT_CLINIC_OR_DEPARTMENT_OTHER): Payer: Medicare Other

## 2012-04-29 DIAGNOSIS — M79609 Pain in unspecified limb: Secondary | ICD-10-CM | POA: Diagnosis not present

## 2012-04-29 DIAGNOSIS — M19079 Primary osteoarthritis, unspecified ankle and foot: Secondary | ICD-10-CM | POA: Insufficient documentation

## 2012-04-29 DIAGNOSIS — I10 Essential (primary) hypertension: Secondary | ICD-10-CM | POA: Diagnosis not present

## 2012-04-29 DIAGNOSIS — E119 Type 2 diabetes mellitus without complications: Secondary | ICD-10-CM | POA: Diagnosis not present

## 2012-04-29 DIAGNOSIS — M79671 Pain in right foot: Secondary | ICD-10-CM

## 2012-04-29 MED ORDER — IBUPROFEN 400 MG PO TABS
400.0000 mg | ORAL_TABLET | Freq: Once | ORAL | Status: DC
  Filled 2012-04-29: qty 1

## 2012-04-29 MED ORDER — NAPROXEN 375 MG PO TABS: 375.0000 mg | ORAL_TABLET | Freq: Two times a day (BID) | ORAL | Status: DC

## 2012-04-29 NOTE — ED Provider Notes (Signed)
History     CSN: [id number]  Arrival date & time 04/29/12  1211   First MD Initiated Contact with Patient 04/29/12 1229      Chief Complaint  Patient presents with  . Foot Pain    (Consider location/radiation/quality/duration/timing/severity/associated sxs/prior treatment) HPI  Patient presents to emergency department complaining of a two-week history waxing waning pain in her distal right forefoot and into her great toe and second toe. Patient states the pain is increased by prolonged standing and ambulation and improved with rest as well as taking a hydrocodone-acetaminophen that was prescribed by her primary care provider. Patient states that about a week ago she was walking down some steps and felt increased pain in her forefoot. Patient states that since then she's had to be up on her feet walking increased amounts due to the fact that she and her husband moving from her home and had to move sooner than expected and therefore have "rushed to get everything done." Patient states that she has been moving "nonstop" for the last 3 days and has increased pain in her right foot. Once again patient states the pain improves and resolves upon lying down and taking hydrocodone-acetaminophen. She has not been taking an anti-inflammatory. Patient states that months ago she was told by another provider that she had arthritis in her feet. Patient states this pain is similar pain that she's been having for 2 weeks. She denies known or specific injury other than increased pain when walking down the stairs a week ago. She denies deformity, break in skin, erythema, numbness, or tingling.  Past Medical History  Diagnosis Date  . Hypertension   . Diabetes mellitus     Past Surgical History  Procedure Date  . Colon resection   . Abdominal hysterectomy   . Leg surgery   . Colonoscopy   . Cataract extraction     No family history on file.  History  Substance Use Topics  . Smoking status: Never  Smoker   . Smokeless tobacco: Not on file  . Alcohol Use: No    OB History    Grav Para Term Preterm Abortions TAB SAB Ect Mult Living                  Review of Systems  All other systems reviewed and are negative.    Allergies  Review of patient's allergies indicates no known allergies.  Home Medications   Current Outpatient Rx  Name Route Sig Dispense Refill  . HYDROCODONE-ACETAMINOPHEN PO Oral Take by mouth.    Marland Kitchen GLIPIZIDE 10 MG PO TABS Oral Take 10 mg by mouth daily.    Marland Kitchen LINAGLIPTIN 5 MG PO TABS Oral Take 5 mg by mouth daily.    Marland Kitchen LOSARTAN POTASSIUM-HCTZ 50-12.5 MG PO TABS Oral Take 1 tablet by mouth daily.    Marland Kitchen METFORMIN HCL 1000 MG PO TABS Oral Take 1,000 mg by mouth daily with breakfast.      BP 133/63  Pulse 71  Temp(Src) 97.9 F (36.6 C) (Oral)  Resp 16  SpO2 99%  Physical Exam  Constitutional: She is oriented to person, place, and time. She appears well-developed and well-nourished. No distress.  HENT:  Head: Normocephalic and atraumatic.  Eyes: Conjunctivae are normal.  Cardiovascular: Normal rate and regular rhythm.   Pulmonary/Chest: Effort normal.  Musculoskeletal: Normal range of motion. She exhibits edema. She exhibits no tenderness.       Right ankle: She exhibits swelling. tenderness.  Trace Swelling of bilateral feet and TTP of right distal forefoot but no TTP ankle or calf. No break in skin. Good pedal pulse and cap refill of all toes. Wiggling toes without difficulty. Normal sensation of foot.   Neurological: She is alert and oriented to person, place, and time.       Normal sensation of entire foot.   Skin: Skin is warm and dry. No rash noted. She is not diaphoretic. No erythema. No pallor.  Psychiatric: She has a normal mood and affect. Her behavior is normal.    ED Course  Procedures (including critical care time)  PO ibuprofen.   Labs Reviewed - No data to display Dg Foot Complete Right  04/29/2012  *RADIOLOGY REPORT*   Clinical Data: Dorsal foot pain.  No known injuries.  RIGHT FOOT COMPLETE - 3+ VIEW  Comparison: None.  Findings: Osteopenia.  No evidence of acute or subacute fracture or dislocation.  Mild hallux valgus.  Joint space narrowing and associated hypertrophic spurring involving the first MTP joint, with an associated marginal erosion at the base of the proximal phalanx of great toe.  Joint space narrowing and hypertrophic changes involving several joints in the midfoot.  Moderately large plantar calcaneal spur.  IMPRESSION: No acute or subacute osseous abnormality.  Osteopenia. 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  Plantar calcaneal spur.  Original Report Authenticated By: Arnell Sieving, M.D.     1. Pain in right foot   2. Arthritis of foot       MDM  No acute findings on foot xray but arthritic changes vs gout that can be treated with low dose NSAIDS with her vicodin for breakthrough pain. No signs of infection.         [place], [place] 04/29/12 1339

## 2012-04-29 NOTE — Discharge Instructions (Signed)
Take naprosyn for inflammation and pain using your hydrocodone-acetaminophen as needed for breakthrough pain. Do not drive or operate machinery with hydrocodone-acetaminophen use. Ice and elevate for additional pain relief. Follow up with your primary care provider in 1-2 weeks for recheck of ongoing pain.   Arthritis, Nonspecific Arthritis is pain, redness, warmth, or puffiness (swelling) of a joint. The joint may be stiff or hurt when you move it. One or more joints may be affected. There are many types of arthritis. Your doctor may not know what type you have right away. The most common cause of arthritis is wear and tear on the joint (osteoarthritis). HOME CARE   Only take medicine as told by your doctor.   Rest the joint as much as possible.   Raise (elevate) your joint if it is puffy.   Use crutches if the painful joint is in your leg.   Drink enough water and fluids to keep your pee (urine) clear or pale yellow.   Follow your doctor's instructions for diet.   Use cold packs for very bad joint pain for 10 to 15 minutes every hour. Ask your doctor if it is okay for you to use hot packs.   Exercise as told by your doctor.   Take a warm shower if you have stiffness in the morning.   Move your sore joints throughout the day.  GET HELP RIGHT AWAY IF:   You do not feel better in 24 hours or are getting worse.   You are having side effects from your medicine.   You are not getting better with treatment.   You have a fever.   You have very bad joint pain, puffiness, or redness.   Many joints become painful and puffy.   You have very bad back pain or leg weakness.   You cannot control when you poop (bowel movement) or pee (urinate).  MAKE SURE YOU:   Understand these instructions.   Will watch your condition.   Will get help right away if you are not doing well or get worse.  Document Released: 02/05/2010 Document Revised: 10/31/2011 Document Reviewed:  02/05/2010 [facility] Patient Information 2012 [place], [place].

## 2012-04-29 NOTE — ED Notes (Signed)
Pt c/o right foot pain and pain to great toe and 2nd toe x 2 wks. Pt denies injury. Pt ambulatory

## 2012-04-29 NOTE — ED Notes (Signed)
Patient transported to X-ray 

## 2012-04-30 NOTE — ED Provider Notes (Signed)
Medical screening examination/treatment/procedure(s) were performed by non-physician practitioner and as supervising physician I was immediately available for consultation/collaboration.   Kensley Lares, MD 04/30/12 0804 

## 2012-05-06 DIAGNOSIS — Z85038 Personal history of other malignant neoplasm of large intestine: Secondary | ICD-10-CM | POA: Diagnosis not present

## 2012-05-06 DIAGNOSIS — Z09 Encounter for follow-up examination after completed treatment for conditions other than malignant neoplasm: Secondary | ICD-10-CM | POA: Diagnosis not present

## 2012-08-20 DIAGNOSIS — H2510 Age-related nuclear cataract, unspecified eye: Secondary | ICD-10-CM | POA: Diagnosis not present

## 2012-08-20 DIAGNOSIS — E119 Type 2 diabetes mellitus without complications: Secondary | ICD-10-CM | POA: Diagnosis not present

## 2012-08-20 DIAGNOSIS — Z961 Presence of intraocular lens: Secondary | ICD-10-CM | POA: Diagnosis not present

## 2012-09-01 DIAGNOSIS — N760 Acute vaginitis: Secondary | ICD-10-CM | POA: Diagnosis not present

## 2012-09-01 DIAGNOSIS — Z01419 Encounter for gynecological examination (general) (routine) without abnormal findings: Secondary | ICD-10-CM | POA: Diagnosis not present

## 2012-09-10 ENCOUNTER — Other Ambulatory Visit: Payer: Self-pay | Admitting: Obstetrics and Gynecology

## 2012-09-10 DIAGNOSIS — Z1231 Encounter for screening mammogram for malignant neoplasm of breast: Secondary | ICD-10-CM

## 2012-10-19 ENCOUNTER — Ambulatory Visit
Admission: RE | Admit: 2012-10-19 | Discharge: 2012-10-19 | Disposition: A | Discharge status: Home / Self Care | Payer: Medicare Other | Source: Ambulatory Visit | Attending: Obstetrics and Gynecology | Admitting: Obstetrics and Gynecology

## 2012-10-19 DIAGNOSIS — Z1231 Encounter for screening mammogram for malignant neoplasm of breast: Secondary | ICD-10-CM | POA: Diagnosis not present

## 2012-11-24 ENCOUNTER — Telehealth: Payer: Self-pay | Admitting: Oncology

## 2012-11-24 NOTE — Telephone Encounter (Signed)
Correction to previous note. Pt is re-establishing w/FS and will see KC on 1/21. S/w pt she is aware.

## 2012-11-24 NOTE — Telephone Encounter (Signed)
Former pt of LO re-establishing w/DM. S/w pt re reassignment and new appt w/[facility] for 1/21 @ 9am and confirmed lb/ct appt for 1/13.

## 2012-11-26 DIAGNOSIS — G609 Hereditary and idiopathic neuropathy, unspecified: Secondary | ICD-10-CM | POA: Diagnosis not present

## 2012-11-26 DIAGNOSIS — I10 Essential (primary) hypertension: Secondary | ICD-10-CM | POA: Diagnosis not present

## 2012-11-26 DIAGNOSIS — E119 Type 2 diabetes mellitus without complications: Secondary | ICD-10-CM | POA: Diagnosis not present

## 2012-11-28 ENCOUNTER — Encounter: Payer: Self-pay | Admitting: Oncology

## 2012-12-07 ENCOUNTER — Other Ambulatory Visit (HOSPITAL_BASED_OUTPATIENT_CLINIC_OR_DEPARTMENT_OTHER): Payer: Medicare Other | Admitting: Lab

## 2012-12-07 ENCOUNTER — Ambulatory Visit (HOSPITAL_COMMUNITY)
Admission: RE | Admit: 2012-12-07 | Discharge: 2012-12-07 | Disposition: A | Discharge status: Home / Self Care | Payer: Medicare Other | Source: Ambulatory Visit | Attending: Hematology and Oncology | Admitting: Hematology and Oncology

## 2012-12-07 DIAGNOSIS — I517 Cardiomegaly: Secondary | ICD-10-CM | POA: Insufficient documentation

## 2012-12-07 DIAGNOSIS — J984 Other disorders of lung: Secondary | ICD-10-CM | POA: Diagnosis not present

## 2012-12-07 DIAGNOSIS — Z9049 Acquired absence of other specified parts of digestive tract: Secondary | ICD-10-CM | POA: Insufficient documentation

## 2012-12-07 DIAGNOSIS — R160 Hepatomegaly, not elsewhere classified: Secondary | ICD-10-CM | POA: Insufficient documentation

## 2012-12-07 DIAGNOSIS — Z9089 Acquired absence of other organs: Secondary | ICD-10-CM | POA: Insufficient documentation

## 2012-12-07 DIAGNOSIS — Z9079 Acquired absence of other genital organ(s): Secondary | ICD-10-CM | POA: Diagnosis not present

## 2012-12-07 DIAGNOSIS — K862 Cyst of pancreas: Secondary | ICD-10-CM | POA: Diagnosis not present

## 2012-12-07 DIAGNOSIS — C187 Malignant neoplasm of sigmoid colon: Secondary | ICD-10-CM

## 2012-12-07 DIAGNOSIS — C189 Malignant neoplasm of colon, unspecified: Secondary | ICD-10-CM | POA: Insufficient documentation

## 2012-12-07 DIAGNOSIS — Z9221 Personal history of antineoplastic chemotherapy: Secondary | ICD-10-CM | POA: Diagnosis not present

## 2012-12-07 DIAGNOSIS — R911 Solitary pulmonary nodule: Secondary | ICD-10-CM | POA: Diagnosis not present

## 2012-12-07 LAB — CBC WITH DIFFERENTIAL/PLATELET
Basophils Absolute: 0 10*3/uL (ref 0.0–0.1)
EOS%: 1.4 % (ref 0.0–7.0)
Eosinophils Absolute: 0.1 10*3/uL (ref 0.0–0.5)
HGB: 12.9 g/dL (ref 11.6–15.9)
LYMPH%: 31.7 % (ref 14.0–49.7)
MCH: 29.6 pg (ref 25.1–34.0)
MCV: 85.9 fL (ref 79.5–101.0)
MONO%: 7.5 % (ref 0.0–14.0)
Platelets: 203 10*3/uL (ref 145–400)
RDW: 13.3 % (ref 11.2–14.5)

## 2012-12-07 LAB — COMPREHENSIVE METABOLIC PANEL (CC13)
Alkaline Phosphatase: 117 U/L (ref 40–150)
BUN: 11 mg/dL (ref 7.0–26.0)
CO2: 26 mEq/L (ref 22–29)
Creatinine: 0.8 mg/dL (ref 0.6–1.1)
Glucose: 286 mg/dl — ABNORMAL HIGH (ref 70–99)
Sodium: 135 mEq/L — ABNORMAL LOW (ref 136–145)
Total Bilirubin: 0.74 mg/dL (ref 0.20–1.20)
Total Protein: 7.2 g/dL (ref 6.4–8.3)

## 2012-12-07 MED ORDER — IOHEXOL 300 MG/ML  SOLN
100.0000 mL | Freq: Once | INTRAMUSCULAR | Status: AC | PRN
  Administered 2012-12-07: 100 mL via INTRAVENOUS

## 2012-12-09 ENCOUNTER — Ambulatory Visit: Payer: Medicare Other | Admitting: Hematology and Oncology

## 2012-12-15 ENCOUNTER — Ambulatory Visit (HOSPITAL_BASED_OUTPATIENT_CLINIC_OR_DEPARTMENT_OTHER): Payer: Medicare Other | Admitting: Oncology

## 2012-12-15 ENCOUNTER — Ambulatory Visit: Payer: Medicare Other | Admitting: Family

## 2012-12-15 ENCOUNTER — Telehealth: Payer: Self-pay | Admitting: Oncology

## 2012-12-15 ENCOUNTER — Encounter: Payer: Self-pay | Admitting: Oncology

## 2012-12-15 VITALS — BP 117/68 | HR 85 | Temp 97.9°F | Resp 20 | Ht 63.0 in | Wt 197.0 lb

## 2012-12-15 DIAGNOSIS — E119 Type 2 diabetes mellitus without complications: Secondary | ICD-10-CM

## 2012-12-15 DIAGNOSIS — C189 Malignant neoplasm of colon, unspecified: Secondary | ICD-10-CM

## 2012-12-15 DIAGNOSIS — C187 Malignant neoplasm of sigmoid colon: Secondary | ICD-10-CM

## 2012-12-15 DIAGNOSIS — C779 Secondary and unspecified malignant neoplasm of lymph node, unspecified: Secondary | ICD-10-CM

## 2012-12-15 DIAGNOSIS — I10 Essential (primary) hypertension: Secondary | ICD-10-CM

## 2012-12-15 NOTE — Telephone Encounter (Signed)
gv and printed appt schedule for pt for Jan....gv pt Barium...pt aware central scheduling will contact with d/t of ct

## 2012-12-15 NOTE — Progress Notes (Signed)
Hematology and Oncology Follow Up Visit  April Franklin [id number] 07/12/1941 72 y.o. 12/15/2012 10:23 AM April Franklin, MDOsborne, April Franklin,*   Principle Diagnosis: This is a 72 year old female with colon cancer. She was initially diagnosed in October 2009. The patient had 2 areas of invasive moderately differentiated adenocarcinoma invading into but not through the muscularis propria. One of 52 lymph nodes was positive for metastatic disease.  Prior Therapy:  1. status post laparoscopic assisted colectomy with rectal anastomosis on 09/22/2008. 2. she received adjuvant XELOX 11/06/2008 through 04/06/2009.  Current therapy: Watchful observation  Interim History:  [name] returns for routine followup by herself. She reports that she's been doing well over the past 9 months. She has intermittent tingling in her fingertips and toes. Appetite is good. Weight is down by 5 pounds but, the patient states she is trying to lose weight. She denies abdominal pain, nausea, vomiting. No change in her bowel habits. She has not noted any bleeding. Last colonoscopy was in the fall 2013 which she reports as normal.  Medications: I have reviewed the patient's current medications. Current outpatient prescriptions:glipiZIDE (GLUCOTROL) 10 MG tablet, Take 10 mg by mouth daily., Disp: , Rfl: ;  linagliptin (TRADJENTA) 5 MG TABS tablet, Take 5 mg by mouth daily., Disp: , Rfl: ;  losartan-hydrochlorothiazide (HYZAAR) 50-12.5 MG per tablet, Take 1 tablet by mouth daily., Disp: , Rfl: ;  meloxicam (MOBIC) 15 MG tablet, Take 15 mg by mouth daily as needed., Disp: , Rfl:  metFORMIN (GLUCOPHAGE) 1000 MG tablet, Take 1,000 mg by mouth daily with breakfast., Disp: , Rfl:   Allergies: No Known Allergies  Past Medical History, Surgical history, Social history, and Family History were reviewed and updated.  Review of Systems: Constitutional:  Negative for fever, chills, night sweats, anorexia, weight loss,  pain. Cardiovascular: no chest pain or dyspnea on exertion Respiratory: no cough, shortness of breath, or wheezing Neurological: no TIA or stroke symptoms Dermatological: negative ENT: negative Skin: Negative. Gastrointestinal: no abdominal pain, change in bowel habits, or black or bloody stools Genito-Urinary: no dysuria, trouble voiding, or hematuria Hematological and Lymphatic: negative Breast: negative for breast lumps Musculoskeletal: negative Remaining ROS negative.  Physical Exam: Blood pressure 117/68, pulse 85, temperature 97.9 F (36.6 C), temperature source Oral, resp. rate 20, height 5\' 3"  (1.6 m), weight 197 lb (89.359 kg). ECOG: 1 General appearance: alert, cooperative and no distress Head: Normocephalic, without obvious abnormality, atraumatic Neck: no adenopathy, no carotid bruit, no JVD, supple, symmetrical, trachea midline and thyroid not enlarged, symmetric, no tenderness/mass/nodules Lymph nodes: Cervical, supraclavicular, and axillary nodes normal. Heart:regular rate and rhythm, S1, S2 normal, no murmur, click, rub or gallop Lung:chest clear, no wheezing, rales, normal symmetric air entry, no tachypnea, retractions or cyanosis Abdomen: soft, non-tender, without masses or organomegaly EXT:no erythema, induration, or nodules   Lab Results: Lab Results  Component Value Date   WBC 5.5 12/07/2012   HGB 12.9 12/07/2012   HCT 37.3 12/07/2012   MCV 85.9 12/07/2012   PLT 203 12/07/2012     Chemistry      Component Value Date/Time   NA 135* 12/07/2012 0928   NA 140 02/18/2012 1259   NA 140 08/06/2011 0950   K 4.2 12/07/2012 0928   K 3.8 02/18/2012 1259   K 4.1 08/06/2011 0950   CL 103 12/07/2012 0928   CL 102 02/18/2012 1259   CL 100 08/06/2011 0950   CO2 26 12/07/2012 0928   CO2 30 02/18/2012 1259   CO2  26 08/06/2011 0950   BUN 11.0 12/07/2012 0928   BUN 9 02/18/2012 1259   BUN 10 08/06/2011 0950   CREATININE 0.8 12/07/2012 0928   CREATININE 0.77 02/18/2012 1259    CREATININE 0.7 08/06/2011 0950      Component Value Date/Time   CALCIUM 9.9 12/07/2012 0928   CALCIUM 10.5 02/18/2012 1259   CALCIUM 10.0 08/06/2011 0950   ALKPHOS 117 12/07/2012 0928   ALKPHOS 95 02/18/2012 1259   ALKPHOS 100* 08/06/2011 0950   AST 14 12/07/2012 0928   AST 15 02/18/2012 1259   AST 23 08/06/2011 0950   ALT 20 12/07/2012 0928   ALT 20 02/18/2012 1259   BILITOT 0.74 12/07/2012 0928   BILITOT 0.7 02/18/2012 1259   BILITOT 0.90 08/06/2011 0950     CEA 2.8  Radiological Studies:  *RADIOLOGY REPORT*  Clinical Data: Colon cancer diagnosed in 2009. Chemotherapy  complete. Colon resection. Appendectomy. Oophrectomy. No  current complaints.  CT CHEST, ABDOMEN AND PELVIS WITH CONTRAST  Technique: Contiguous axial images of the chest abdomen and pelvis  were obtained after IV contrast administration.  Contrast: 100 ml Omnipaque-300  Comparison: 08/06/2011  CT CHEST  Findings: Lung windows demonstrate similar 2 mm perifissural left  upper lobe nodule on image 25/series 5, likely a subpleural lymph  node.  Soft tissue windows demonstrate no supraclavicular adenopathy.  Tiny low density thyroid lesions which are nonspecific. Tortuous  descending thoracic aorta. Mild cardiomegaly, without pericardial  effusion. No mediastinal or hilar adenopathy. Mildly dilated  thoracic esophagus with fluid level within on image 26/series 2.  Similar minimal residual thymic tissue in the anterior mediastinum.  IMPRESSION:  1. No acute process or evidence of metastatic disease in the chest.  2. Cardiomegaly.  3. Esophageal air fluid level suggests dysmotility or  gastroesophageal reflux.  CT ABDOMEN AND PELVIS  Findings: Similar prominent right lobe of the liver, 20.5 cm.  Vague area of hypoattenuation in the posterior aspect of the  lateral segment left lobe of the liver measures 1.7 x 1.6 cm on  image 47/series 2 versus 1.8 x 1.4 cm on the prior (when  remeasured). . Normal spleen, stomach.  Dorsal pancreatic  body/tail junction cystic lesion measures 1.5 cm on image 46 of  series 2. This is unchanged (when remeasured).  Normal gallbladder. Common duct upper normal for patient age,  similar. Normal right adrenal gland. Mild left adrenal thickening  which is unchanged.  Interpolar right renal cyst measures 1.3 cm.  No retroperitoneal or retrocrural adenopathy.  At least partial left hemicolectomy. Normal small bowel caliber.  No ascites. No evidence of omental or peritoneal disease.  No pelvic adenopathy. Normal urinary bladder. Hysterectomy. No  adnexal mass. No significant free fluid.  No acute osseous abnormality.  IMPRESSION:  1. No acute process or evidence of metastatic disease in the  abdomen or pelvis.  2. Similar hepatomegaly. Favor focal steatosis in the left lobe  of the liver, grossly similar to on the prior.  3. Similar cystic lesion within the pancreatic body/tail junction.  Most likely a pseudocyst. Recommend attention on follow-up.  Original Report Authenticated By: Jeronimo Greaves, M.D.    Impression and Plan: This is a 72 year old female with the following issues:  1. History of colon cancer. Discussed with the patient she has no evidence of recurrence based on history, physical exam, lab work, and CT scan. Recommend continued observation. We will plan to see her back for followup in one year with labs and a CT scan  prior to her visit. She will followup with Dr. Danise Edge for colonoscopies as directed by his office.  2. Diabetes mellitus. The patient is on multiple medications for this. She is followed by her PCP.  3. Hypertension. Blood pressure is controlled on losartan. She will followup with PCP.  4. Followup. Return visit with labs and CT scan in one year.  Case reviewed with Dr Clelia Croft. Spent more than half the time coordinating care.    [place], [place] 1/21/201410:23 AM

## 2013-02-23 DIAGNOSIS — G609 Hereditary and idiopathic neuropathy, unspecified: Secondary | ICD-10-CM | POA: Diagnosis not present

## 2013-02-23 DIAGNOSIS — IMO0001 Reserved for inherently not codable concepts without codable children: Secondary | ICD-10-CM | POA: Diagnosis not present

## 2013-02-23 DIAGNOSIS — I10 Essential (primary) hypertension: Secondary | ICD-10-CM | POA: Diagnosis not present

## 2013-03-04 ENCOUNTER — Encounter: Payer: Self-pay | Admitting: Oncology

## 2013-03-04 NOTE — Progress Notes (Signed)
I called the patient to clarify her social security income. It looks like she gets 264.20??? She mentioned 477ish, but then said they were taking some more out for medicare, so she don't get that much. She did verify what her hubby gets. I looked at last EPP and will review with Darlena before processing.

## 2013-03-05 ENCOUNTER — Encounter: Payer: Self-pay | Admitting: Oncology

## 2013-03-05 NOTE — Progress Notes (Signed)
100%ind 03/04/13-09/03/13- I will send the letter and card to the patient. All documents are scanned in.

## 2013-05-26 DIAGNOSIS — I10 Essential (primary) hypertension: Secondary | ICD-10-CM | POA: Diagnosis not present

## 2013-05-26 DIAGNOSIS — G609 Hereditary and idiopathic neuropathy, unspecified: Secondary | ICD-10-CM | POA: Diagnosis not present

## 2013-05-26 DIAGNOSIS — IMO0001 Reserved for inherently not codable concepts without codable children: Secondary | ICD-10-CM | POA: Diagnosis not present

## 2013-08-13 DIAGNOSIS — H26019 Infantile and juvenile cortical, lamellar, or zonular cataract, unspecified eye: Secondary | ICD-10-CM | POA: Diagnosis not present

## 2013-08-13 DIAGNOSIS — E119 Type 2 diabetes mellitus without complications: Secondary | ICD-10-CM | POA: Diagnosis not present

## 2013-08-13 DIAGNOSIS — Z961 Presence of intraocular lens: Secondary | ICD-10-CM | POA: Diagnosis not present

## 2013-08-13 DIAGNOSIS — H2510 Age-related nuclear cataract, unspecified eye: Secondary | ICD-10-CM | POA: Diagnosis not present

## 2013-09-07 DIAGNOSIS — N760 Acute vaginitis: Secondary | ICD-10-CM | POA: Diagnosis not present

## 2013-09-07 DIAGNOSIS — M899 Disorder of bone, unspecified: Secondary | ICD-10-CM | POA: Diagnosis not present

## 2013-09-14 ENCOUNTER — Other Ambulatory Visit: Payer: Self-pay

## 2013-09-14 DIAGNOSIS — Z1231 Encounter for screening mammogram for malignant neoplasm of breast: Secondary | ICD-10-CM

## 2013-09-20 DIAGNOSIS — G63 Polyneuropathy in diseases classified elsewhere: Secondary | ICD-10-CM | POA: Diagnosis not present

## 2013-09-20 DIAGNOSIS — E1149 Type 2 diabetes mellitus with other diabetic neurological complication: Secondary | ICD-10-CM | POA: Diagnosis not present

## 2013-09-20 DIAGNOSIS — I10 Essential (primary) hypertension: Secondary | ICD-10-CM | POA: Diagnosis not present

## 2013-10-26 ENCOUNTER — Ambulatory Visit
Admission: RE | Admit: 2013-10-26 | Discharge: 2013-10-26 | Disposition: A | Discharge status: Home / Self Care | Payer: Medicare Other | Source: Ambulatory Visit

## 2013-10-26 DIAGNOSIS — Z1231 Encounter for screening mammogram for malignant neoplasm of breast: Secondary | ICD-10-CM

## 2013-11-11 ENCOUNTER — Telehealth: Payer: Self-pay | Admitting: Oncology

## 2013-11-11 NOTE — Telephone Encounter (Signed)
lvm for pt regarding to Jan appt moved to Feb per Dr. Clelia Croft pal request....mailed pt appt sched//avs and letter

## 2013-12-01 ENCOUNTER — Encounter: Payer: Self-pay | Admitting: Oncology

## 2013-12-01 NOTE — Progress Notes (Signed)
Patient left a message to call about her discount that has expired. I called and left her a message. I will have to tell her no more discount, will have to set up via billing.

## 2013-12-15 ENCOUNTER — Encounter (HOSPITAL_COMMUNITY): Payer: Self-pay

## 2013-12-15 ENCOUNTER — Other Ambulatory Visit (HOSPITAL_BASED_OUTPATIENT_CLINIC_OR_DEPARTMENT_OTHER): Payer: Medicare Other

## 2013-12-15 ENCOUNTER — Ambulatory Visit (HOSPITAL_COMMUNITY)
Admission: RE | Admit: 2013-12-15 | Discharge: 2013-12-15 | Disposition: A | Discharge status: Home / Self Care | Payer: Medicare Other | Source: Ambulatory Visit | Attending: Oncology | Admitting: Oncology

## 2013-12-15 DIAGNOSIS — I700 Atherosclerosis of aorta: Secondary | ICD-10-CM | POA: Insufficient documentation

## 2013-12-15 DIAGNOSIS — K769 Liver disease, unspecified: Secondary | ICD-10-CM | POA: Diagnosis not present

## 2013-12-15 DIAGNOSIS — K8689 Other specified diseases of pancreas: Secondary | ICD-10-CM | POA: Insufficient documentation

## 2013-12-15 DIAGNOSIS — R911 Solitary pulmonary nodule: Secondary | ICD-10-CM | POA: Insufficient documentation

## 2013-12-15 DIAGNOSIS — C189 Malignant neoplasm of colon, unspecified: Secondary | ICD-10-CM | POA: Diagnosis not present

## 2013-12-15 DIAGNOSIS — M47817 Spondylosis without myelopathy or radiculopathy, lumbosacral region: Secondary | ICD-10-CM | POA: Insufficient documentation

## 2013-12-15 DIAGNOSIS — C187 Malignant neoplasm of sigmoid colon: Secondary | ICD-10-CM | POA: Diagnosis not present

## 2013-12-15 LAB — COMPREHENSIVE METABOLIC PANEL (CC13)
ALT: 22 U/L (ref 0–55)
ANION GAP: 9 meq/L (ref 3–11)
AST: 16 U/L (ref 5–34)
Albumin: 3.8 g/dL (ref 3.5–5.0)
Alkaline Phosphatase: 93 U/L (ref 40–150)
BUN: 13.8 mg/dL (ref 7.0–26.0)
CALCIUM: 10.7 mg/dL — AB (ref 8.4–10.4)
CO2: 27 mEq/L (ref 22–29)
CREATININE: 0.9 mg/dL (ref 0.6–1.1)
Chloride: 101 mEq/L (ref 98–109)
Glucose: 228 mg/dl — ABNORMAL HIGH (ref 70–140)
Potassium: 4.1 mEq/L (ref 3.5–5.1)
Sodium: 137 mEq/L (ref 136–145)
Total Bilirubin: 0.89 mg/dL (ref 0.20–1.20)
Total Protein: 7.2 g/dL (ref 6.4–8.3)

## 2013-12-15 LAB — CBC WITH DIFFERENTIAL/PLATELET
BASO%: 0.2 % (ref 0.0–2.0)
Basophils Absolute: 0 10*3/uL (ref 0.0–0.1)
EOS%: 1 % (ref 0.0–7.0)
Eosinophils Absolute: 0.1 10*3/uL (ref 0.0–0.5)
HCT: 39.7 % (ref 34.8–46.6)
HGB: 13.3 g/dL (ref 11.6–15.9)
LYMPH#: 1.9 10*3/uL (ref 0.9–3.3)
LYMPH%: 37.7 % (ref 14.0–49.7)
MCH: 28.5 pg (ref 25.1–34.0)
MCHC: 33.5 g/dL (ref 31.5–36.0)
MCV: 85.2 fL (ref 79.5–101.0)
MONO#: 0.4 10*3/uL (ref 0.1–0.9)
MONO%: 7.1 % (ref 0.0–14.0)
NEUT#: 2.7 10*3/uL (ref 1.5–6.5)
NEUT%: 54 % (ref 38.4–76.8)
Platelets: 218 10*3/uL (ref 145–400)
RBC: 4.66 10*6/uL (ref 3.70–5.45)
RDW: 13 % (ref 11.2–14.5)
WBC: 4.9 10*3/uL (ref 3.9–10.3)

## 2013-12-15 LAB — CEA: CEA: 3 ng/mL (ref 0.0–5.0)

## 2013-12-15 MED ORDER — IOHEXOL 300 MG/ML  SOLN
100.0000 mL | Freq: Once | INTRAMUSCULAR | Status: AC | PRN
  Administered 2013-12-15: 100 mL via INTRAVENOUS

## 2013-12-17 ENCOUNTER — Telehealth: Payer: Self-pay | Admitting: Oncology

## 2013-12-17 ENCOUNTER — Ambulatory Visit: Payer: Medicare Other | Admitting: Oncology

## 2013-12-17 NOTE — Telephone Encounter (Signed)
pt came in on wrong day. pt appt was changed back in dec from 1/23 to 2/3. per pt r/s from 2/3 to 2/11 @ 9:15am. pt given new appt calender for feb 11th appt.

## 2013-12-27 ENCOUNTER — Telehealth: Payer: Self-pay | Admitting: Oncology

## 2013-12-27 NOTE — Telephone Encounter (Signed)
pt called to r/s 2/11 appt - pt has new appt for 3/4.

## 2013-12-28 ENCOUNTER — Ambulatory Visit: Payer: Medicare Other | Admitting: Oncology

## 2014-01-05 ENCOUNTER — Ambulatory Visit: Payer: Medicare Other | Admitting: Oncology

## 2014-01-26 ENCOUNTER — Ambulatory Visit (HOSPITAL_BASED_OUTPATIENT_CLINIC_OR_DEPARTMENT_OTHER): Payer: Medicare Other | Admitting: Oncology

## 2014-01-26 ENCOUNTER — Encounter: Payer: Self-pay | Admitting: Oncology

## 2014-01-26 ENCOUNTER — Telehealth: Payer: Self-pay | Admitting: Oncology

## 2014-01-26 VITALS — BP 124/75 | HR 77 | Temp 97.0°F | Resp 18 | Ht 63.0 in

## 2014-01-26 DIAGNOSIS — Z85038 Personal history of other malignant neoplasm of large intestine: Secondary | ICD-10-CM

## 2014-01-26 DIAGNOSIS — I10 Essential (primary) hypertension: Secondary | ICD-10-CM | POA: Diagnosis not present

## 2014-01-26 DIAGNOSIS — E119 Type 2 diabetes mellitus without complications: Secondary | ICD-10-CM | POA: Diagnosis not present

## 2014-01-26 DIAGNOSIS — C189 Malignant neoplasm of colon, unspecified: Secondary | ICD-10-CM

## 2014-01-26 NOTE — Progress Notes (Signed)
Hematology and Oncology Follow Up Visit  April Franklin [id number] 1941-03-24 73 y.o. 01/26/2014 11:58 AM Horton Finer, MDOsborne, [name],*   Principle Diagnosis: This is a 73 year old female with colon cancer. She was initially diagnosed in October 2009. The patient had 2 areas of invasive moderately differentiated adenocarcinoma invading into but not through the muscularis propria. One of 25 lymph nodes was positive for metastatic disease. Her disease was T2 N1 stage III colon cancer.  Prior Therapy:  1. status post laparoscopic assisted colectomy with rectal anastomosis on 09/22/2008. 2. she received adjuvant XELOX 11/06/2008 through 04/06/2009.  Current therapy: Watchful observation  Interim History:  April Franklin returns for routine followup by herself. She reports that she's been doing since her last visit. She has intermittent tingling in her fingertips and toes which have not changed. She reports that her appetite is good. She denies abdominal pain, nausea, vomiting. No change in her bowel habits. She has not noted any bleeding. Last colonoscopy within normal range per her report. She has not reported any decline in her performance status and activity level. Has not reported any hospitalizations or illnesses.  Medications: I have reviewed the patient's current medications.  Current Outpatient Prescriptions  Medication Sig Dispense Refill  . glipiZIDE (GLUCOTROL) 10 MG tablet Take 10 mg by mouth daily.      Marland Kitchen linagliptin (TRADJENTA) 5 MG TABS tablet Take 5 mg by mouth daily.      Marland Kitchen losartan-hydrochlorothiazide (HYZAAR) 50-12.5 MG per tablet Take 1 tablet by mouth daily.      . meloxicam (MOBIC) 15 MG tablet Take 15 mg by mouth daily as needed.      . metFORMIN (GLUCOPHAGE) 1000 MG tablet Take 1,000 mg by mouth daily with breakfast.       No current facility-administered medications for this visit.    Allergies: No Known Allergies  Past Medical History, Surgical  history, Social history, and Family History were reviewed and updated.  Review of Systems: Constitutional:  Negative for fever, chills, night sweats, anorexia, weight loss, pain. Cardiovascular: no chest pain or dyspnea on exertion Respiratory: no cough, shortness of breath, or wheezing Neurological: no TIA or stroke symptoms  Remaining ROS negative.  Physical Exam: Blood pressure 124/75, pulse 77, temperature 97 F (36.1 C), resp. rate 18, height 5\' 3"  (1.6 m), SpO2 100.00%. ECOG: 1 General appearance: alert, cooperative and no distress Head: Normocephalic, without obvious abnormality, atraumatic Neck: no adenopathy, no carotid bruit, no JVD, supple, symmetrical, trachea midline and thyroid not enlarged, symmetric, no tenderness/mass/nodules Lymph nodes: Cervical, supraclavicular, and axillary nodes normal. Heart:regular rate and rhythm, S1, S2 normal, no murmur, click, rub or gallop Lung:chest clear, no wheezing, rales, normal symmetric air entry, no tachypnea, retractions or cyanosis Abdomen: soft, non-tender, without masses or organomegaly EXT:no erythema, induration, or nodules. Skin had no rashes or lesions.   Lab Results: Lab Results  Component Value Date   WBC 4.9 12/15/2013   HGB 13.3 12/15/2013   HCT 39.7 12/15/2013   MCV 85.2 12/15/2013   PLT 218 12/15/2013     Chemistry      Component Value Date/Time   NA 137 12/15/2013 0930   NA 140 02/18/2012 1259   NA 140 08/06/2011 0950   K 4.1 12/15/2013 0930   K 3.8 02/18/2012 1259   K 4.1 08/06/2011 0950   CL 103 12/07/2012 0928   CL 102 02/18/2012 1259   CL 100 08/06/2011 0950   CO2 27 12/15/2013 0930   CO2 30 02/18/2012  1259   CO2 26 08/06/2011 0950   BUN 13.8 12/15/2013 0930   BUN 9 02/18/2012 1259   BUN 10 08/06/2011 0950   CREATININE 0.9 12/15/2013 0930   CREATININE 0.77 02/18/2012 1259   CREATININE 0.7 08/06/2011 0950      Component Value Date/Time   CALCIUM 10.7* 12/15/2013 0930   CALCIUM 10.5 02/18/2012 1259   CALCIUM 10.0  08/06/2011 0950   ALKPHOS 93 12/15/2013 0930   ALKPHOS 95 02/18/2012 1259   ALKPHOS 100* 08/06/2011 0950   AST 16 12/15/2013 0930   AST 15 02/18/2012 1259   AST 23 08/06/2011 0950   ALT 22 12/15/2013 0930   ALT 20 02/18/2012 1259   ALT 25 08/06/2011 0950   BILITOT 0.89 12/15/2013 0930   BILITOT 0.7 02/18/2012 1259   BILITOT 0.90 08/06/2011 0950     CEA 3.0  EXAM:  CT CHEST, ABDOMEN, AND PELVIS WITH CONTRAST  TECHNIQUE:  Multidetector CT imaging of the chest, abdomen and pelvis was  performed following the standard protocol during bolus  administration of intravenous contrast.  CONTRAST: 17mL OMNIPAQUE IOHEXOL 300 MG/ML SOLN  COMPARISON: CT chest abdomen and pelvis dated 12/07/2012.  FINDINGS:  CT CHEST FINDINGS  There is no pleural effusion. No airspace consolidation or  atelectasis. Stable three 3-4 mm nodule in the left lower lobe,  image 26/series 5. No new or enlarging pulmonary nodules or mass is  identified. The trachea appears patent and is midline. The heart  size is normal. There is no enlarged mediastinal or hilar lymph  nodes. Mild calcified atherosclerotic change involves the thoracic  aorta.  No supraclavicular or axillary adenopathy.  Review of the visualized bony structures is significant for thoracic  spondylosis. There are no aggressive lytic or sclerotic bone lesions  identified.  CT ABDOMEN AND PELVIS FINDINGS  Low attenuation structure within the posterior left hepatic lobe  measures 1.4 cm, image 48/series 2. This is stable from previous  exam. The gallbladder appears normal. Stable mild fusiform ectasia  of the common bile duct which measures up to 9 mm. No obstructing  stone or mass noted. The pancreatic duct is ectatic. On today's  study this measures up to 7 mm, image 54/series 2. Previously this  measured 4 mm. Dorsal pancreatic body/ tail junction cystic lesion  is again identified. This measures 1.8 cm, image 50/series 2.  Previously this measured 1.5  cm. On the study from 08/06/2011 this  measured 1.3 cm. The spleen is normal.  The adrenal glands are both unremarkable. Normal appearance of both  kidneys. The urinary bladder appears normal. Previous hysterectomy.  The stomach appears within normal limits. The small bowel loops have  a normal course and caliber without evidence for bowel obstruction.  Partial left hemicolectomy. There is no ascites or evidence of  peritoneal/omental disease.  Review of the visualized osseous structures is significant for mild  lumbar spondylosis. No aggressive lytic or sclerotic bone lesions  identified.  IMPRESSION:  1. No acute process or evidence of metastatic disease in the chest,  abdomen or pelvis.  2. Slight increase in size of cystic lesion within the pancreatic  body/tail junction. Additionally, there is progressive ectasia of  the pancreatic duct. If there is a history of pancreatitis these  findings may reflect duct ectasia and pseudocyst. Attention on  followup imaging is advised.  3. Stable low-attenuation structure within the posterior left lobe  of liver which likely represents focal fatty deposition.   Impression and Plan: This is a  73 year old female with the following issues:  1. Colon cancer diagnosed in 2009 found to have stage III T2 N1 disease the. She continues to have no evidence of recurrence based on history, physical exam, lab work, and CT scan. Her CT scan on 12/15/2013 as well as her laboratory data were discussed today personally. For the time being, she is over 5 years out from the conclusion of her treatment and has no evidence of disease. I plan on seeing her next year for a clinical visit and will do a CT scan only as needed. If she is doing well next year she will likely need no further followup with oncology.  2. Diabetes mellitus. The patient is on multiple medications for this. She is followed by her PCP.  3. Hypertension. Blood pressure is controlled on losartan.  She will followup with PCP.  4. Followup. Return visit with labs in one year.    Jaiceon Collister 3/4/201511:58 AM

## 2014-01-26 NOTE — Telephone Encounter (Signed)
gave pt appt for lab and MD on 2016

## 2014-02-24 DIAGNOSIS — I10 Essential (primary) hypertension: Secondary | ICD-10-CM | POA: Diagnosis not present

## 2014-02-24 DIAGNOSIS — M79609 Pain in unspecified limb: Secondary | ICD-10-CM | POA: Diagnosis not present

## 2014-02-24 DIAGNOSIS — G609 Hereditary and idiopathic neuropathy, unspecified: Secondary | ICD-10-CM | POA: Diagnosis not present

## 2014-02-24 DIAGNOSIS — IMO0001 Reserved for inherently not codable concepts without codable children: Secondary | ICD-10-CM | POA: Diagnosis not present

## 2014-03-22 DIAGNOSIS — M899 Disorder of bone, unspecified: Secondary | ICD-10-CM | POA: Diagnosis not present

## 2014-08-26 DIAGNOSIS — Z85038 Personal history of other malignant neoplasm of large intestine: Secondary | ICD-10-CM | POA: Diagnosis not present

## 2014-08-26 DIAGNOSIS — G609 Hereditary and idiopathic neuropathy, unspecified: Secondary | ICD-10-CM | POA: Diagnosis not present

## 2014-08-26 DIAGNOSIS — E1165 Type 2 diabetes mellitus with hyperglycemia: Secondary | ICD-10-CM | POA: Diagnosis not present

## 2014-08-26 DIAGNOSIS — I10 Essential (primary) hypertension: Secondary | ICD-10-CM | POA: Diagnosis not present

## 2014-09-08 DIAGNOSIS — M8580 Other specified disorders of bone density and structure, unspecified site: Secondary | ICD-10-CM | POA: Diagnosis not present

## 2014-09-08 DIAGNOSIS — B373 Candidiasis of vulva and vagina: Secondary | ICD-10-CM | POA: Diagnosis not present

## 2014-09-08 DIAGNOSIS — Z01411 Encounter for gynecological examination (general) (routine) with abnormal findings: Secondary | ICD-10-CM | POA: Diagnosis not present

## 2014-09-22 DIAGNOSIS — H25013 Cortical age-related cataract, bilateral: Secondary | ICD-10-CM | POA: Diagnosis not present

## 2014-09-22 DIAGNOSIS — Z961 Presence of intraocular lens: Secondary | ICD-10-CM | POA: Diagnosis not present

## 2014-09-22 DIAGNOSIS — H2513 Age-related nuclear cataract, bilateral: Secondary | ICD-10-CM | POA: Diagnosis not present

## 2014-09-22 DIAGNOSIS — E119 Type 2 diabetes mellitus without complications: Secondary | ICD-10-CM | POA: Diagnosis not present

## 2014-09-23 ENCOUNTER — Other Ambulatory Visit: Payer: Self-pay

## 2014-09-23 DIAGNOSIS — Z1231 Encounter for screening mammogram for malignant neoplasm of breast: Secondary | ICD-10-CM

## 2014-10-28 ENCOUNTER — Ambulatory Visit
Admission: RE | Admit: 2014-10-28 | Discharge: 2014-10-28 | Disposition: A | Discharge status: Home / Self Care | Payer: Medicare Other | Source: Ambulatory Visit

## 2014-10-28 DIAGNOSIS — Z1231 Encounter for screening mammogram for malignant neoplasm of breast: Secondary | ICD-10-CM

## 2014-11-28 DIAGNOSIS — G609 Hereditary and idiopathic neuropathy, unspecified: Secondary | ICD-10-CM | POA: Diagnosis not present

## 2014-11-28 DIAGNOSIS — I10 Essential (primary) hypertension: Secondary | ICD-10-CM | POA: Diagnosis not present

## 2014-11-28 DIAGNOSIS — E1165 Type 2 diabetes mellitus with hyperglycemia: Secondary | ICD-10-CM | POA: Diagnosis not present

## 2015-01-19 ENCOUNTER — Encounter: Payer: Self-pay | Admitting: Oncology

## 2015-01-19 NOTE — Progress Notes (Signed)
Called and let the patient a message. I will send her fiinancial application back to her. It has to be completed and have income info attached. I will let her know when she calls me back also.

## 2015-01-27 ENCOUNTER — Ambulatory Visit (HOSPITAL_BASED_OUTPATIENT_CLINIC_OR_DEPARTMENT_OTHER): Payer: Medicare Other | Admitting: Oncology

## 2015-01-27 ENCOUNTER — Other Ambulatory Visit: Payer: Self-pay | Admitting: *Deleted

## 2015-01-27 ENCOUNTER — Other Ambulatory Visit (HOSPITAL_BASED_OUTPATIENT_CLINIC_OR_DEPARTMENT_OTHER): Payer: Medicare Other

## 2015-01-27 VITALS — BP 125/62 | HR 62 | Temp 97.6°F | Resp 18 | Ht 63.0 in | Wt 161.5 lb

## 2015-01-27 DIAGNOSIS — C189 Malignant neoplasm of colon, unspecified: Secondary | ICD-10-CM | POA: Diagnosis not present

## 2015-01-27 DIAGNOSIS — Z85038 Personal history of other malignant neoplasm of large intestine: Secondary | ICD-10-CM

## 2015-01-27 DIAGNOSIS — I10 Essential (primary) hypertension: Secondary | ICD-10-CM

## 2015-01-27 DIAGNOSIS — E119 Type 2 diabetes mellitus without complications: Secondary | ICD-10-CM | POA: Diagnosis not present

## 2015-01-27 LAB — COMPREHENSIVE METABOLIC PANEL (CC13)
ALK PHOS: 119 U/L (ref 40–150)
ALT: 11 U/L (ref 0–55)
AST: 11 U/L (ref 5–34)
Albumin: 3.7 g/dL (ref 3.5–5.0)
Anion Gap: 10 mEq/L (ref 3–11)
BILIRUBIN TOTAL: 0.74 mg/dL (ref 0.20–1.20)
BUN: 11.5 mg/dL (ref 7.0–26.0)
CO2: 26 mEq/L (ref 22–29)
CREATININE: 0.9 mg/dL (ref 0.6–1.1)
Calcium: 10.8 mg/dL — ABNORMAL HIGH (ref 8.4–10.4)
Chloride: 102 mEq/L (ref 98–109)
EGFR: 72 mL/min/{1.73_m2} — ABNORMAL LOW (ref >90)
GLUCOSE: 388 mg/dL — AB (ref 70–140)
Potassium: 4.1 mEq/L (ref 3.5–5.1)
Sodium: 138 mEq/L (ref 136–145)
Total Protein: 6.7 g/dL (ref 6.4–8.3)

## 2015-01-27 LAB — CBC WITH DIFFERENTIAL/PLATELET
BASO%: 0.5 % (ref 0.0–2.0)
Basophils Absolute: 0 10*3/uL (ref 0.0–0.1)
EOS%: 1.2 % (ref 0.0–7.0)
Eosinophils Absolute: 0.1 10*3/uL (ref 0.0–0.5)
HCT: 37.5 % (ref 34.8–46.6)
HGB: 12.4 g/dL (ref 11.6–15.9)
LYMPH%: 36.4 % (ref 14.0–49.7)
MCH: 28.7 pg (ref 25.1–34.0)
MCHC: 33 g/dL (ref 31.5–36.0)
MCV: 86.7 fL (ref 79.5–101.0)
MONO#: 0.2 10*3/uL (ref 0.1–0.9)
MONO%: 5.6 % (ref 0.0–14.0)
NEUT#: 2.5 10*3/uL (ref 1.5–6.5)
NEUT%: 56.3 % (ref 38.4–76.8)
Platelets: 217 10*3/uL (ref 145–400)
RBC: 4.32 10*6/uL (ref 3.70–5.45)
RDW: 12.9 % (ref 11.2–14.5)
WBC: 4.4 10*3/uL (ref 3.9–10.3)
lymph#: 1.6 10*3/uL (ref 0.9–3.3)

## 2015-01-27 LAB — CEA: CEA: 5.1 ng/mL — ABNORMAL HIGH (ref 0.0–5.0)

## 2015-01-27 NOTE — Progress Notes (Signed)
Hematology and Oncology Follow Up Visit  April Franklin [id number] 10-06-1941 74 y.o. 01/27/2015 9:57 AM April Franklin, MDOsborne, April Franklin,*   Principle Diagnosis: This is a 74 year old female with colon cancer. She was initially diagnosed in October 2009. The patient had 2 areas of invasive moderately differentiated adenocarcinoma invading into but not through the muscularis propria. One of 63 lymph nodes was positive for metastatic disease. Her disease was T2 N1 stage III colon cancer.  Prior Therapy:  1. status post laparoscopic assisted colectomy with rectal anastomosis on 09/22/2008. 2. she received adjuvant XELOX 11/06/2008 through 04/06/2009.  Current therapy: Observation and surveillance.  Interim History:  April Franklin returns for routine followup by herself. Since the last visit, she has no complaints. She continues to be very active and performs activities of daily living without any decline. She has intermittent tingling in her fingertips and toes which have not changed. She reports that her appetite is good. She denies abdominal pain, nausea, vomiting. No change in her bowel habits. She has not noted any bleeding. Last colonoscopy within normal range per her report. She does not report any headaches, blurry vision, syncope or seizures. She does not report any chest pain, palpitation orthopnea. She does not report any cough or hemoptysis. She does not report any nausea, vomiting, abdominal pain. She does not report any constipation or diarrhea. She had lost weight intentionally with diet and exercise. She does not report any frequency urgency or hesitancy. She does not report any skeletal complaints. Rest of her review of systems unremarkable.   Medications: I have reviewed the patient's current medications.  Current Outpatient Prescriptions  Medication Sig Dispense Refill  . Canagliflozin (INVOKANA) 100 MG TABS Take 100 mg by mouth daily before breakfast.    . glipiZIDE  (GLUCOTROL) 10 MG tablet Take 10 mg by mouth daily.    Marland Kitchen linagliptin (TRADJENTA) 5 MG TABS tablet Take 5 mg by mouth daily.    Marland Kitchen losartan-hydrochlorothiazide (HYZAAR) 50-12.5 MG per tablet Take 1 tablet by mouth daily.    . meloxicam (MOBIC) 15 MG tablet Take 15 mg by mouth daily as needed.    . metFORMIN (GLUCOPHAGE) 1000 MG tablet Take 1,000 mg by mouth daily with breakfast.     No current facility-administered medications for this visit.    Allergies: No Known Allergies  Past Medical History, Surgical history, Social history, and Family History were reviewed and updated.   Physical Exam: Blood pressure 125/62, pulse 62, temperature 97.6 F (36.4 C), temperature source Oral, resp. rate 18, height 5\' 3"  (1.6 m), weight 161 lb 8 oz (73.256 kg), SpO2 100 %. ECOG: 1 General appearance: alert and cooperative Head: Normocephalic, without obvious abnormality Neck: no adenopathy Lymph nodes: Cervical, supraclavicular, and axillary nodes normal. Heart:regular rate and rhythm, S1, S2 normal, no murmur, click, rub or gallop Lung:chest clear, no wheezing, rales, normal symmetric air entry. Abdomen: soft, non-tender, without masses or organomegaly EXT:no erythema, induration, or nodules. Skin had no rashes or lesions.   Lab Results: Lab Results  Component Value Date   WBC 4.4 01/27/2015   HGB 12.4 01/27/2015   HCT 37.5 01/27/2015   MCV 86.7 01/27/2015   PLT 217 01/27/2015     Chemistry      Component Value Date/Time   NA 137 12/15/2013 0930   NA 140 02/18/2012 1259   NA 140 08/06/2011 0950   K 4.1 12/15/2013 0930   K 3.8 02/18/2012 1259   K 4.1 08/06/2011 0950   CL 103  12/07/2012 0928   CL 102 02/18/2012 1259   CL 100 08/06/2011 0950   CO2 27 12/15/2013 0930   CO2 30 02/18/2012 1259   CO2 26 08/06/2011 0950   BUN 13.8 12/15/2013 0930   BUN 9 02/18/2012 1259   BUN 10 08/06/2011 0950   CREATININE 0.9 12/15/2013 0930   CREATININE 0.77 02/18/2012 1259   CREATININE 0.7  08/06/2011 0950      Component Value Date/Time   CALCIUM 10.7* 12/15/2013 0930   CALCIUM 10.5 02/18/2012 1259   CALCIUM 10.0 08/06/2011 0950   ALKPHOS 93 12/15/2013 0930   ALKPHOS 95 02/18/2012 1259   ALKPHOS 100* 08/06/2011 0950   AST 16 12/15/2013 0930   AST 15 02/18/2012 1259   AST 23 08/06/2011 0950   ALT 22 12/15/2013 0930   ALT 20 02/18/2012 1259   ALT 25 08/06/2011 0950   BILITOT 0.89 12/15/2013 0930   BILITOT 0.7 02/18/2012 1259   BILITOT 0.90 08/06/2011 0950        Impression and Plan: This is a 74 year old female with the following issues:  1. Colon cancer diagnosed in 2009 found to have stage III T2 N1 disease. She is over 5 years out now from the conclusion of her adjuvant chemotherapy. Her imaging studies in January 2015 as well as here laboratory testing did not show any evidence of disease. She is clinically well and has no signs or symptoms to suggest recurrent disease. At this time, no further surveillance as needed from an oncology standpoint and I will be happy to see her in the future as needed.  She understands that she needs to continue with surveillance colonoscopies and she is up-to-date at this time.  2. Diabetes mellitus. The patient is on multiple medications for this. She is followed by her PCP.  3. Hypertension. Blood pressure is controlled on losartan. She will followup with PCP.  4. Followup. As needed.    ZGYFVC,BSWHQ 3/4/20169:57 AM

## 2015-04-18 DIAGNOSIS — I10 Essential (primary) hypertension: Secondary | ICD-10-CM | POA: Diagnosis not present

## 2015-04-18 DIAGNOSIS — E1165 Type 2 diabetes mellitus with hyperglycemia: Secondary | ICD-10-CM | POA: Diagnosis not present

## 2015-05-02 DIAGNOSIS — E1165 Type 2 diabetes mellitus with hyperglycemia: Secondary | ICD-10-CM | POA: Diagnosis not present

## 2015-05-30 DIAGNOSIS — S20212A Contusion of left front wall of thorax, initial encounter: Secondary | ICD-10-CM | POA: Diagnosis not present

## 2015-05-30 DIAGNOSIS — R0789 Other chest pain: Secondary | ICD-10-CM | POA: Diagnosis not present

## 2015-05-30 DIAGNOSIS — M25512 Pain in left shoulder: Secondary | ICD-10-CM | POA: Diagnosis not present

## 2015-05-30 DIAGNOSIS — M1259 Traumatic arthropathy, multiple sites: Secondary | ICD-10-CM | POA: Diagnosis not present

## 2015-05-30 DIAGNOSIS — S40012A Contusion of left shoulder, initial encounter: Secondary | ICD-10-CM | POA: Diagnosis not present

## 2015-05-30 DIAGNOSIS — S4992XA Unspecified injury of left shoulder and upper arm, initial encounter: Secondary | ICD-10-CM | POA: Diagnosis not present

## 2015-05-30 DIAGNOSIS — S299XXA Unspecified injury of thorax, initial encounter: Secondary | ICD-10-CM | POA: Diagnosis not present

## 2015-06-02 DIAGNOSIS — M25512 Pain in left shoulder: Secondary | ICD-10-CM | POA: Diagnosis not present

## 2015-06-02 DIAGNOSIS — R0781 Pleurodynia: Secondary | ICD-10-CM | POA: Diagnosis not present

## 2015-06-02 DIAGNOSIS — W108XXD Fall (on) (from) other stairs and steps, subsequent encounter: Secondary | ICD-10-CM | POA: Diagnosis not present

## 2015-08-09 DIAGNOSIS — E1165 Type 2 diabetes mellitus with hyperglycemia: Secondary | ICD-10-CM | POA: Diagnosis not present

## 2015-08-09 DIAGNOSIS — Z23 Encounter for immunization: Secondary | ICD-10-CM | POA: Diagnosis not present

## 2015-08-09 DIAGNOSIS — E780 Pure hypercholesterolemia: Secondary | ICD-10-CM | POA: Diagnosis not present

## 2015-08-09 DIAGNOSIS — I10 Essential (primary) hypertension: Secondary | ICD-10-CM | POA: Diagnosis not present

## 2015-08-09 DIAGNOSIS — C189 Malignant neoplasm of colon, unspecified: Secondary | ICD-10-CM | POA: Diagnosis not present

## 2015-08-10 ENCOUNTER — Other Ambulatory Visit: Payer: Self-pay | Admitting: Gastroenterology

## 2015-09-08 DIAGNOSIS — E1165 Type 2 diabetes mellitus with hyperglycemia: Secondary | ICD-10-CM | POA: Diagnosis not present

## 2015-09-15 ENCOUNTER — Encounter: Payer: Self-pay | Admitting: Skilled Nursing Facility1

## 2015-09-15 ENCOUNTER — Encounter: Payer: Medicare Other | Attending: Internal Medicine | Admitting: Skilled Nursing Facility1

## 2015-09-15 VITALS — Ht 62.0 in | Wt 138.0 lb

## 2015-09-15 DIAGNOSIS — E119 Type 2 diabetes mellitus without complications: Secondary | ICD-10-CM | POA: Diagnosis not present

## 2015-09-15 DIAGNOSIS — Z713 Dietary counseling and surveillance: Secondary | ICD-10-CM | POA: Insufficient documentation

## 2015-09-15 NOTE — Progress Notes (Signed)
Diabetes Self-Management Education  Visit Type: First/Initial  Appt. Start Time: 10:00 Appt. End Time: 11:30  09/15/2015  April Franklin, identified by name and date of birth, is a 74 y.o. female with a diagnosis of Diabetes: Type 2.   ASSESSMENT  Height 5\' 2"  (1.575 m), weight 138 lb (62.596 kg). Body mass index is 25.23 kg/(m^2).      Diabetes Self-Management Education - 09/15/15 1007    Visit Information   Visit Type First/Initial   Initial Visit   Diabetes Type Type 2   Are you currently following a meal plan? No   Are you taking your medications as prescribed? --  sometimes forgetful   Date Diagnosed 2002 or 2003   Health Coping   How would you rate your overall health? Good   Psychosocial Assessment   Patient Belief/Attitude about Diabetes Afraid   Self-management support Family   Other persons present Spouse/SO   Patient Concerns Nutrition/Meal planning;Glycemic Control   Special Needs None   Preferred Learning Style No preference indicated   Learning Readiness Contemplating   How often do you need to have someone help you when you read instructions, pamphlets, or other written materials from your doctor or pharmacy? 1 - Never   What is the last grade level you completed in school? 40NU grade   Complications   Last HgB A1C per patient/outside source --  unknown   How often do you check your blood sugar? 1-2 times/day   Fasting Blood glucose range (mg/dL) 70-129   Number of hypoglycemic episodes per month 0   Number of hyperglycemic episodes per week 0   Have you had a dilated eye exam in the past 12 months? Yes   Have you had a dental exam in the past 12 months? No   Are you checking your feet? No   Dietary Intake   Breakfast mcdonalds-----boiled egg, oatmeal   Snack (morning) potatoe chips   Lunch sandwhich-------none   Snack (afternoon) vanilla waffers   Dinner salad, potatoes, chic fila waffle fries   Snack (evening) grahm crackers and peanut butter    Beverage(s) diet soda, lemonade with tea, water, coffee, chocolate milk   Exercise   Exercise Type ADL's   Patient Education   Previous Diabetes Education No   Disease state  Definition of diabetes, type 1 and 2, and the diagnosis of diabetes;Factors that contribute to the development of diabetes   Nutrition management  Role of diet in the treatment of diabetes and the relationship between the three main macronutrients and blood glucose level;Food label reading, portion sizes and measuring food.;Carbohydrate counting;Reviewed blood glucose goals for pre and post meals and how to evaluate the patients' food intake on their blood glucose level.   Physical activity and exercise  Role of exercise on diabetes management, blood pressure control and cardiac health.;Identified with patient nutritional and/or medication changes necessary with exercise.   Monitoring Interpreting lab values - A1C, lipid, urine microalbumina.;Daily foot exams;Yearly dilated eye exam   Chronic complications Assessed and discussed foot care and prevention of foot problems;Dental care   Psychosocial adjustment Travel strategies   Individualized Goals (developed by patient)   Nutrition Follow meal plan discussed;General guidelines for healthy choices and portions discussed;Adjust meds/carbs with exercise as discussed   Physical Activity Exercise 5-7 days per week   Monitoring  test my blood glucose as discussed;test blood glucose pre and post meals as discussed   Reducing Risk do foot checks daily   Outcomes   Expected  Outcomes Demonstrated interest in learning. Expect positive outcomes   Future DMSE PRN   Program Status Completed      Individualized Plan for Diabetes Self-Management Training:   Learning Objective:  Patient will have a greater understanding of diabetes self-management. Patient education plan is to attend individual and/or group sessions per assessed needs and concerns.   Plan:   There are no Patient  Instructions on file for this visit.  Expected Outcomes:  Demonstrated interest in learning. Expect positive outcomes  Education material provided: Living Well with Diabetes, Meal plan card, My Plate and Snack sheet  If problems or questions, patient to contact team via:  Phone  Future DSME appointment: PRN

## 2015-10-02 ENCOUNTER — Other Ambulatory Visit: Payer: Self-pay | Admitting: Gastroenterology

## 2015-10-10 ENCOUNTER — Ambulatory Visit (HOSPITAL_COMMUNITY)
Admission: RE | Admit: 2015-10-10 | Discharge: 2015-10-10 | Disposition: A | Discharge status: Home / Self Care | Payer: Medicare Other | Source: Ambulatory Visit | Attending: Gastroenterology | Admitting: Gastroenterology

## 2015-10-10 ENCOUNTER — Encounter (HOSPITAL_COMMUNITY)
Admission: RE | Disposition: A | Discharge status: Home / Self Care | Payer: Self-pay | Source: Ambulatory Visit | Attending: Gastroenterology

## 2015-10-10 ENCOUNTER — Encounter (HOSPITAL_COMMUNITY): Payer: Self-pay

## 2015-10-10 DIAGNOSIS — E119 Type 2 diabetes mellitus without complications: Secondary | ICD-10-CM | POA: Insufficient documentation

## 2015-10-10 DIAGNOSIS — E7800 Pure hypercholesterolemia, unspecified: Secondary | ICD-10-CM | POA: Insufficient documentation

## 2015-10-10 DIAGNOSIS — D128 Benign neoplasm of rectum: Secondary | ICD-10-CM | POA: Diagnosis not present

## 2015-10-10 DIAGNOSIS — Z980 Intestinal bypass and anastomosis status: Secondary | ICD-10-CM | POA: Diagnosis not present

## 2015-10-10 DIAGNOSIS — Z1211 Encounter for screening for malignant neoplasm of colon: Secondary | ICD-10-CM | POA: Insufficient documentation

## 2015-10-10 DIAGNOSIS — I10 Essential (primary) hypertension: Secondary | ICD-10-CM | POA: Diagnosis not present

## 2015-10-10 DIAGNOSIS — Z9049 Acquired absence of other specified parts of digestive tract: Secondary | ICD-10-CM | POA: Diagnosis not present

## 2015-10-10 DIAGNOSIS — Z85038 Personal history of other malignant neoplasm of large intestine: Secondary | ICD-10-CM | POA: Insufficient documentation

## 2015-10-10 HISTORY — PX: FLEXIBLE SIGMOIDOSCOPY: SHX5431

## 2015-10-10 SURGERY — SIGMOIDOSCOPY, FLEXIBLE
Anesthesia: Moderate Sedation

## 2015-10-10 MED ORDER — FLEET ENEMA 7-19 GM/118ML RE ENEM
ENEMA | RECTAL | Status: AC
  Filled 2015-10-10: qty 1

## 2015-10-10 MED ORDER — SODIUM CHLORIDE 0.9 % IV SOLN: INTRAVENOUS | Status: DC

## 2015-10-10 MED ORDER — FLEET ENEMA 7-19 GM/118ML RE ENEM: 1.0000 | ENEMA | Freq: Once | RECTAL | Status: DC

## 2015-10-10 NOTE — Progress Notes (Signed)
Repeat flex sig after tap water enemas. Pt being monitored during the procedure but no sedation is being given.

## 2015-10-10 NOTE — H&P (Signed)
  Procedure: Surveillance proctosigmoidoscopy. History of multiple colon cancers. Subtotal colectomy with ileorectal anastomosis.  History: The patient is a 74 year old female born [birth year]. She is scheduled to undergo flexible proctosigmoidoscopy today.  Past medical history: Type 2 diabetes mellitus. Hypertension. Hypercholesterolemia. Multiple adenocarcinomas of the colon. Subtotal colectomy with ileorectal anastomosis. Hysterectomy. Appendectomy. Knee surgery. Cataract surgery.  Exam: The patient is alert and lying comfortably on the endoscopy stretcher. Abdomen is soft and nontender to palpation. Lungs are clear to auscultation. Cardiac exam reveals a regular rhythm.  Plan: Proceed with surveillance flexible proctosigmoidoscopy

## 2015-10-10 NOTE — Discharge Instructions (Signed)
Flexible Sigmoidoscopy, Care After  Refer to this sheet in the next few weeks. These instructions provide you with information on caring for yourself after your procedure. Your health care provider may also give you more specific instructions. Your treatment has been planned according to current medical practices, but problems sometimes occur. Call your health care provider if you have any problems or questions after your procedure.  WHAT TO EXPECT AFTER THE PROCEDURE  After your procedure, it is typical to have the following:   · Abdominal cramps.  · Bloating.  · A small amount of rectal bleeding if you had a biopsy.  HOME CARE INSTRUCTIONS  · Only take over-the-counter or prescription medicines for pain, fever, or discomfort as directed by your health care provider.  · Resume your normal diet and activities as directed by your health care provider.  SEEK MEDICAL CARE IF:  · You have abdominal pain or cramping that lasts longer than 1 hour after the procedure.  · You continue to have small amounts of rectal bleeding after 24 hours.  · You have nausea or vomiting.  · You feel weak or dizzy.  SEEK IMMEDIATE MEDICAL CARE IF:   · You have a fever.  · You pass large blood clots or see a large amount of blood in the toilet after having a bowel movement. This may also occur 10-14 days after the procedure. It is more likely if you had a biopsy.  · You develop abdominal pain that is not relieved with medicine or your abdominal pain gets worse.  · You have nausea or vomiting for more than 24 hours after the procedure.     This information is not intended to replace advice given to you by your health care provider. Make sure you discuss any questions you have with your health care provider.     Document Released: 11/16/2013 Document Reviewed: 11/16/2013  Elsevier Interactive Patient Education ©2016 [employer]

## 2015-10-10 NOTE — Op Note (Signed)
Procedure: Surveillance flexible proctosigmoidoscopy. Subtotal colectomy with ileo-rectal anastomosis to treat multiple colon cancers.  Endoscopist: Earle Gell  Premedication: None  Procedure: The patient was placed in the left lateral decubitus position. Anal inspection and digital rectal exam were normal. The Pentax pediatric colonoscope was introduced into the rectum and advanced to the ileo-rectal anastomosis. Colonic preparation for the exam today was good.  From the mid rectum, a 3 mm sessile polyp was removed with the cold biopsy forceps. Otherwise surveillance proctoscopic exam to the ileo-rectal anastomosis was normal.  Recommendation: Schedule surveillance flexible proctosigmoidoscopy in 3 years. Administered laxatives prior to the schedule next procedure.

## 2015-10-11 ENCOUNTER — Encounter (HOSPITAL_COMMUNITY): Payer: Self-pay | Admitting: Gastroenterology

## 2015-11-24 ENCOUNTER — Other Ambulatory Visit: Payer: Self-pay | Admitting: Internal Medicine

## 2015-11-24 DIAGNOSIS — Z23 Encounter for immunization: Secondary | ICD-10-CM | POA: Diagnosis not present

## 2015-11-24 DIAGNOSIS — Z1231 Encounter for screening mammogram for malignant neoplasm of breast: Secondary | ICD-10-CM

## 2015-11-24 DIAGNOSIS — E7800 Pure hypercholesterolemia, unspecified: Secondary | ICD-10-CM | POA: Diagnosis not present

## 2015-11-24 DIAGNOSIS — E1140 Type 2 diabetes mellitus with diabetic neuropathy, unspecified: Secondary | ICD-10-CM | POA: Diagnosis not present

## 2015-11-24 DIAGNOSIS — I10 Essential (primary) hypertension: Secondary | ICD-10-CM | POA: Diagnosis not present

## 2015-11-24 DIAGNOSIS — Z0000 Encounter for general adult medical examination without abnormal findings: Secondary | ICD-10-CM | POA: Diagnosis not present

## 2015-11-24 DIAGNOSIS — Z1389 Encounter for screening for other disorder: Secondary | ICD-10-CM | POA: Diagnosis not present

## 2015-11-24 DIAGNOSIS — E1165 Type 2 diabetes mellitus with hyperglycemia: Secondary | ICD-10-CM | POA: Diagnosis not present

## 2015-11-24 DIAGNOSIS — C189 Malignant neoplasm of colon, unspecified: Secondary | ICD-10-CM | POA: Diagnosis not present

## 2015-11-30 ENCOUNTER — Ambulatory Visit
Admission: RE | Admit: 2015-11-30 | Discharge: 2015-11-30 | Disposition: A | Discharge status: Home / Self Care | Payer: Medicare Other | Source: Ambulatory Visit | Attending: Internal Medicine | Admitting: Internal Medicine

## 2015-11-30 DIAGNOSIS — Z1231 Encounter for screening mammogram for malignant neoplasm of breast: Secondary | ICD-10-CM | POA: Diagnosis not present

## 2016-03-06 DIAGNOSIS — E1165 Type 2 diabetes mellitus with hyperglycemia: Secondary | ICD-10-CM | POA: Diagnosis not present

## 2016-03-06 DIAGNOSIS — I10 Essential (primary) hypertension: Secondary | ICD-10-CM | POA: Diagnosis not present

## 2016-03-06 DIAGNOSIS — E1140 Type 2 diabetes mellitus with diabetic neuropathy, unspecified: Secondary | ICD-10-CM | POA: Diagnosis not present

## 2016-03-06 DIAGNOSIS — E7800 Pure hypercholesterolemia, unspecified: Secondary | ICD-10-CM | POA: Diagnosis not present

## 2016-03-06 DIAGNOSIS — Z794 Long term (current) use of insulin: Secondary | ICD-10-CM | POA: Diagnosis not present

## 2016-03-07 DIAGNOSIS — H25012 Cortical age-related cataract, left eye: Secondary | ICD-10-CM | POA: Diagnosis not present

## 2016-03-07 DIAGNOSIS — H2512 Age-related nuclear cataract, left eye: Secondary | ICD-10-CM | POA: Diagnosis not present

## 2016-03-07 DIAGNOSIS — Z961 Presence of intraocular lens: Secondary | ICD-10-CM | POA: Diagnosis not present

## 2016-03-07 DIAGNOSIS — Z794 Long term (current) use of insulin: Secondary | ICD-10-CM | POA: Diagnosis not present

## 2016-03-07 DIAGNOSIS — E119 Type 2 diabetes mellitus without complications: Secondary | ICD-10-CM | POA: Diagnosis not present

## 2016-06-10 DIAGNOSIS — Z794 Long term (current) use of insulin: Secondary | ICD-10-CM | POA: Diagnosis not present

## 2016-06-10 DIAGNOSIS — C189 Malignant neoplasm of colon, unspecified: Secondary | ICD-10-CM | POA: Diagnosis not present

## 2016-06-10 DIAGNOSIS — E1165 Type 2 diabetes mellitus with hyperglycemia: Secondary | ICD-10-CM | POA: Diagnosis not present

## 2016-06-10 DIAGNOSIS — Z1389 Encounter for screening for other disorder: Secondary | ICD-10-CM | POA: Diagnosis not present

## 2016-06-10 DIAGNOSIS — E7800 Pure hypercholesterolemia, unspecified: Secondary | ICD-10-CM | POA: Diagnosis not present

## 2016-06-10 DIAGNOSIS — I10 Essential (primary) hypertension: Secondary | ICD-10-CM | POA: Diagnosis not present

## 2016-06-10 DIAGNOSIS — E1140 Type 2 diabetes mellitus with diabetic neuropathy, unspecified: Secondary | ICD-10-CM | POA: Diagnosis not present

## 2016-09-10 DIAGNOSIS — Z01419 Encounter for gynecological examination (general) (routine) without abnormal findings: Secondary | ICD-10-CM | POA: Diagnosis not present

## 2016-11-07 DIAGNOSIS — E7800 Pure hypercholesterolemia, unspecified: Secondary | ICD-10-CM | POA: Diagnosis not present

## 2016-11-07 DIAGNOSIS — M8580 Other specified disorders of bone density and structure, unspecified site: Secondary | ICD-10-CM | POA: Diagnosis not present

## 2016-11-07 DIAGNOSIS — E1140 Type 2 diabetes mellitus with diabetic neuropathy, unspecified: Secondary | ICD-10-CM | POA: Diagnosis not present

## 2016-11-07 DIAGNOSIS — I10 Essential (primary) hypertension: Secondary | ICD-10-CM | POA: Diagnosis not present

## 2016-11-07 DIAGNOSIS — Z794 Long term (current) use of insulin: Secondary | ICD-10-CM | POA: Diagnosis not present

## 2016-11-07 DIAGNOSIS — Z0000 Encounter for general adult medical examination without abnormal findings: Secondary | ICD-10-CM | POA: Diagnosis not present

## 2016-11-07 DIAGNOSIS — E1165 Type 2 diabetes mellitus with hyperglycemia: Secondary | ICD-10-CM | POA: Diagnosis not present

## 2016-11-07 DIAGNOSIS — C189 Malignant neoplasm of colon, unspecified: Secondary | ICD-10-CM | POA: Diagnosis not present

## 2016-11-07 DIAGNOSIS — Z1389 Encounter for screening for other disorder: Secondary | ICD-10-CM | POA: Diagnosis not present

## 2016-11-07 DIAGNOSIS — Z23 Encounter for immunization: Secondary | ICD-10-CM | POA: Diagnosis not present

## 2016-11-12 ENCOUNTER — Other Ambulatory Visit: Payer: Self-pay | Admitting: Internal Medicine

## 2016-11-12 DIAGNOSIS — R7989 Other specified abnormal findings of blood chemistry: Secondary | ICD-10-CM

## 2016-11-12 DIAGNOSIS — R945 Abnormal results of liver function studies: Principal | ICD-10-CM

## 2016-11-20 ENCOUNTER — Other Ambulatory Visit: Payer: Self-pay | Admitting: Internal Medicine

## 2016-11-20 ENCOUNTER — Ambulatory Visit
Admission: RE | Admit: 2016-11-20 | Discharge: 2016-11-20 | Disposition: A | Discharge status: Home / Self Care | Payer: Medicare Other | Source: Ambulatory Visit | Attending: Internal Medicine | Admitting: Internal Medicine

## 2016-11-20 DIAGNOSIS — R7989 Other specified abnormal findings of blood chemistry: Secondary | ICD-10-CM

## 2016-11-20 DIAGNOSIS — R740 Nonspecific elevation of levels of transaminase and lactic acid dehydrogenase [LDH]: Secondary | ICD-10-CM | POA: Diagnosis not present

## 2016-11-20 DIAGNOSIS — K839 Disease of biliary tract, unspecified: Secondary | ICD-10-CM | POA: Diagnosis not present

## 2016-11-20 DIAGNOSIS — R945 Abnormal results of liver function studies: Principal | ICD-10-CM

## 2016-11-21 ENCOUNTER — Other Ambulatory Visit: Payer: Self-pay | Admitting: Internal Medicine

## 2016-11-21 DIAGNOSIS — K839 Disease of biliary tract, unspecified: Secondary | ICD-10-CM

## 2016-12-09 ENCOUNTER — Ambulatory Visit
Admission: RE | Admit: 2016-12-09 | Discharge: 2016-12-09 | Disposition: A | Discharge status: Home / Self Care | Payer: Medicare Other | Source: Ambulatory Visit | Attending: Internal Medicine | Admitting: Internal Medicine

## 2016-12-09 DIAGNOSIS — K839 Disease of biliary tract, unspecified: Secondary | ICD-10-CM

## 2016-12-09 DIAGNOSIS — Z85038 Personal history of other malignant neoplasm of large intestine: Secondary | ICD-10-CM | POA: Diagnosis not present

## 2016-12-09 DIAGNOSIS — C787 Secondary malignant neoplasm of liver and intrahepatic bile duct: Secondary | ICD-10-CM | POA: Diagnosis not present

## 2016-12-09 MED ORDER — GADOBENATE DIMEGLUMINE 529 MG/ML IV SOLN
13.0000 mL | Freq: Once | INTRAVENOUS | Status: AC | PRN
  Administered 2016-12-09: 13 mL via INTRAVENOUS

## 2016-12-10 DIAGNOSIS — K7689 Other specified diseases of liver: Secondary | ICD-10-CM | POA: Diagnosis not present

## 2016-12-10 DIAGNOSIS — K869 Disease of pancreas, unspecified: Secondary | ICD-10-CM | POA: Diagnosis not present

## 2016-12-24 DIAGNOSIS — R945 Abnormal results of liver function studies: Secondary | ICD-10-CM | POA: Diagnosis not present

## 2016-12-24 DIAGNOSIS — K869 Disease of pancreas, unspecified: Secondary | ICD-10-CM | POA: Diagnosis not present

## 2016-12-25 ENCOUNTER — Other Ambulatory Visit (HOSPITAL_COMMUNITY): Payer: Self-pay | Admitting: Gastroenterology

## 2016-12-25 DIAGNOSIS — K8689 Other specified diseases of pancreas: Secondary | ICD-10-CM

## 2016-12-25 DIAGNOSIS — R945 Abnormal results of liver function studies: Secondary | ICD-10-CM

## 2016-12-27 ENCOUNTER — Other Ambulatory Visit: Payer: Self-pay | Admitting: Radiology

## 2016-12-30 ENCOUNTER — Encounter (HOSPITAL_COMMUNITY): Payer: Self-pay

## 2016-12-30 ENCOUNTER — Ambulatory Visit (HOSPITAL_COMMUNITY)
Admission: RE | Admit: 2016-12-30 | Discharge: 2016-12-30 | Disposition: A | Discharge status: Home / Self Care | Payer: Medicare Other | Source: Ambulatory Visit | Attending: Gastroenterology | Admitting: Gastroenterology

## 2016-12-30 DIAGNOSIS — C189 Malignant neoplasm of colon, unspecified: Secondary | ICD-10-CM | POA: Diagnosis not present

## 2016-12-30 DIAGNOSIS — Z90710 Acquired absence of both cervix and uterus: Secondary | ICD-10-CM | POA: Diagnosis not present

## 2016-12-30 DIAGNOSIS — Z9849 Cataract extraction status, unspecified eye: Secondary | ICD-10-CM | POA: Insufficient documentation

## 2016-12-30 DIAGNOSIS — C787 Secondary malignant neoplasm of liver and intrahepatic bile duct: Secondary | ICD-10-CM | POA: Diagnosis not present

## 2016-12-30 DIAGNOSIS — Z7984 Long term (current) use of oral hypoglycemic drugs: Secondary | ICD-10-CM | POA: Diagnosis not present

## 2016-12-30 DIAGNOSIS — E119 Type 2 diabetes mellitus without complications: Secondary | ICD-10-CM | POA: Insufficient documentation

## 2016-12-30 DIAGNOSIS — R7989 Other specified abnormal findings of blood chemistry: Secondary | ICD-10-CM | POA: Insufficient documentation

## 2016-12-30 DIAGNOSIS — K831 Obstruction of bile duct: Secondary | ICD-10-CM | POA: Diagnosis not present

## 2016-12-30 DIAGNOSIS — I10 Essential (primary) hypertension: Secondary | ICD-10-CM | POA: Diagnosis not present

## 2016-12-30 DIAGNOSIS — K8689 Other specified diseases of pancreas: Secondary | ICD-10-CM

## 2016-12-30 DIAGNOSIS — M1990 Unspecified osteoarthritis, unspecified site: Secondary | ICD-10-CM | POA: Insufficient documentation

## 2016-12-30 DIAGNOSIS — K869 Disease of pancreas, unspecified: Secondary | ICD-10-CM | POA: Diagnosis not present

## 2016-12-30 DIAGNOSIS — K7689 Other specified diseases of liver: Secondary | ICD-10-CM | POA: Diagnosis not present

## 2016-12-30 DIAGNOSIS — R945 Abnormal results of liver function studies: Secondary | ICD-10-CM

## 2016-12-30 DIAGNOSIS — C801 Malignant (primary) neoplasm, unspecified: Secondary | ICD-10-CM | POA: Diagnosis not present

## 2016-12-30 LAB — GLUCOSE, CAPILLARY
GLUCOSE-CAPILLARY: 199 mg/dL — AB (ref 65–99)
Glucose-Capillary: 150 mg/dL — ABNORMAL HIGH (ref 65–99)

## 2016-12-30 MED ORDER — FENTANYL CITRATE (PF) 100 MCG/2ML IJ SOLN
INTRAMUSCULAR | Status: AC | PRN
  Administered 2016-12-30: 50 ug via INTRAVENOUS

## 2016-12-30 MED ORDER — FLUMAZENIL 0.5 MG/5ML IV SOLN
INTRAVENOUS | Status: AC
  Filled 2016-12-30: qty 5

## 2016-12-30 MED ORDER — MIDAZOLAM HCL 2 MG/2ML IJ SOLN
INTRAMUSCULAR | Status: AC
  Filled 2016-12-30: qty 2

## 2016-12-30 MED ORDER — NALOXONE HCL 0.4 MG/ML IJ SOLN
INTRAMUSCULAR | Status: AC
  Filled 2016-12-30: qty 1

## 2016-12-30 MED ORDER — SODIUM CHLORIDE 0.9 % IV SOLN
INTRAVENOUS | Status: DC
  Administered 2016-12-30: 12:00:00 via INTRAVENOUS

## 2016-12-30 MED ORDER — FENTANYL CITRATE (PF) 100 MCG/2ML IJ SOLN
INTRAMUSCULAR | Status: AC
  Filled 2016-12-30: qty 2

## 2016-12-30 MED ORDER — MIDAZOLAM HCL 5 MG/5ML IJ SOLN
INTRAMUSCULAR | Status: AC | PRN
  Administered 2016-12-30: 1 mg via INTRAVENOUS

## 2016-12-30 MED ORDER — GELATIN ABSORBABLE 12-7 MM EX MISC
CUTANEOUS | Status: AC
  Filled 2016-12-30: qty 1

## 2016-12-30 MED ORDER — LIDOCAINE HCL 1 % IJ SOLN
INTRAMUSCULAR | Status: AC
  Filled 2016-12-30: qty 20

## 2016-12-30 NOTE — Sedation Documentation (Signed)
Patient is resting comfortably. 

## 2016-12-30 NOTE — Sedation Documentation (Signed)
Doctor at bedside speaking to patient and family members

## 2016-12-30 NOTE — Procedures (Signed)
Interventional Radiology Procedure Note  Procedure: US guided biopsy of liver mass.    Complications: None   Recommendations:  - Ok to shower tomorrow - Do not submerge for 7 days - Routine wound care - 2 hours observation - advance diet   Signed,  Dulcy Fanny. Earleen Newport, DO

## 2016-12-30 NOTE — Sedation Documentation (Signed)
EPIC charting. Timeout completed first at 1423 then Versed given 1mg  at 1423.

## 2016-12-30 NOTE — H&P (Signed)
Chief Complaint: Patient was seen in consultation today for liver lesion biopsy at the request of Outlaw,William  Referring Physician(s): Arta Silence  Supervising Physician: Corrie Mckusick  Patient Status: [facility] - Out-pt  History of Present Illness: April Franklin is a 76 y.o. female   Pt with Known Colon Ca Hx New abd pain weeks ago Wt loss over 1 yr Work up included [place] and labs Elevated LFTs and abnormal [place] Led to MRCP 12/09/16 IMPRESSION: Diffuse hepatic metastases. Intrahepatic biliary dilatation the left hepatic lobe, which appears to be due to a small central left hepatic lobe metastasis. Mild dilatation of proximal common bile duct seen with smooth tapering distally, and no evidence of distal common bile duct obstruction. 4.8 x 2.9 cm ill-defined mass in the pancreatic body with proximal pancreatic ductal dilatation. This is highly suspicious for primary pancreatic carcinoma. Mild portacaval lymphadenopathy, suspicious for metastatic disease.  Scheduled for liver lesion biopsy in Rad today   Past Medical History:  Diagnosis Date  . Arthritis   . Colon cancer ([place])   . Diabetes mellitus   . Hypertension     Past Surgical History:  Procedure Laterality Date  . ABDOMINAL HYSTERECTOMY    . APPENDECTOMY    . CATARACT EXTRACTION    . COLON RESECTION    . COLONOSCOPY    . FLEXIBLE SIGMOIDOSCOPY N/A 10/10/2015   Procedure: FLEXIBLE SIGMOIDOSCOPY;  Surgeon: Garlan Fair, MD;  Location: [facility] ENDOSCOPY;  Service: Endoscopy;  Laterality: N/A;  UNSEDATED  . LEG SURGERY      Allergies: Patient has no known allergies.  Medications: Prior to Admission medications   Medication Sig Start Date End Date Taking? Authorizing Provider  glipiZIDE (GLUCOTROL) 10 MG tablet Take 10 mg by mouth daily.   Yes Historical Provider, MD  Insulin Degludec (TRESIBA FLEXTOUCH) 200 UNIT/ML SOPN Inject 20 Units into the muscle every evening. After dinner 11/07/16  Yes Historical  Provider, MD  linagliptin (TRADJENTA) 5 MG TABS tablet Take 5 mg by mouth daily.   Yes Historical Provider, MD  losartan (COZAAR) 50 MG tablet Take 50 mg by mouth daily.   Yes Historical Provider, MD  metFORMIN (GLUCOPHAGE) 1000 MG tablet Take 1,000 mg by mouth daily with breakfast.   Yes Historical Provider, MD     History reviewed. No pertinent family history.  Social History   Social History  . Marital status: Married    Spouse name: N/A  . Number of children: N/A  . Years of education: N/A   Social History Main Topics  . Smoking status: Never Smoker  . Smokeless tobacco: None  . Alcohol use No  . Drug use: No  . Sexual activity: Not Asked   Other Topics Concern  . None   Social History Narrative  . None     Review of Systems: A 12 point ROS discussed and pertinent positives are indicated in the HPI above.  All other systems are negative.  Review of Systems  Constitutional: Negative for chills, fatigue and fever.  Respiratory: Negative for cough, chest tightness and shortness of breath.   Cardiovascular: Negative for chest pain.  Gastrointestinal: Negative for abdominal pain, nausea and vomiting.    Vital Signs: BP 138/68   Pulse 60   Temp 98 F (36.7 C) (Oral)   Resp 18   Ht 5\' 1"  (1.549 m)   Wt 143 lb (64.9 kg)   SpO2 100%   BMI 27.02 kg/m   Physical Exam  Constitutional: She is oriented  to person, place, and time. She appears well-nourished. No distress.  HENT:  Head: Normocephalic and atraumatic.  Mouth/Throat: Oropharynx is clear and moist.  Eyes: EOM are normal.  Cardiovascular: Normal rate, regular rhythm and normal heart sounds.  Exam reveals no gallop and no friction rub.   No murmur heard. Pulmonary/Chest: Effort normal and breath sounds normal. No respiratory distress. She has no wheezes. She has no rales.  Abdominal: Soft. Bowel sounds are normal. She exhibits no distension and no mass. There is no tenderness.  Musculoskeletal: She exhibits  no edema.  Neurological: She is alert and oriented to person, place, and time.    Outside facility labs: 12/24/2016: Wbc: 4.6 Plt: 174 INR 1.2 PT: 11.4    Mallampati Score:  MD Evaluation Airway: WNL Heart: WNL Abdomen: WNL Chest/ Lungs: WNL ASA  Classification: 3 Mallampati/Airway Score: Two  Imaging: Mr Abdomen Mrcp W Wo Contast  Result Date: 12/09/2016 CLINICAL DATA:  Elevated liver function tests. Biliary dilatation and abnormal appearing liver on recent ultrasound. Personal history of colon carcinoma. Creatinine was obtained on site at [place] at [address]Results: Creatinine 0.6 mg/dL. EXAM: MRI ABDOMEN WITHOUT AND WITH CONTRAST (INCLUDING MRCP) TECHNIQUE: Multiplanar multisequence MR imaging of the abdomen was performed both before and after the administration of intravenous contrast. Heavily T2-weighted images of the biliary and pancreatic ducts were obtained, and three-dimensional MRCP images were rendered by post processing. CONTRAST:  48mL MULTIHANCE GADOBENATE DIMEGLUMINE 529 MG/ML IV SOLN COMPARISON:  Ultrasound on 11/20/2016 and CT on 12/15/2013 FINDINGS: Lower chest: No acute findings. Hepatobiliary: Numerous hepatic metastases are seen throughout the right and left hepatic lobes. Index lesion in the posterior right hepatic lobe on image 52/18 measures 3.5 x 1.8 cm. Index lesion in the lateral segment of the left lobe on image 38/18 measures 2.3 x 2.1 cm. Gallbladder is unremarkable in appearance. Intrahepatic biliary ductal dilatation is seen in the left hepatic lobe, which appears to be due to a small metastasis in the central left hepatic lobe. There is mild dilatation of the proximal common bile duct measuring 9 mm, however the common bile duct shows smooth tapering distally, without evidence of choledocholithiasis or other obstructing etiology. Pancreas: Ill-defined hypovascular soft tissue mass is seen in the pancreatic body measuring 4.8 by 2.9 cm on  image 31/20. There is marked dilatation of the pancreatic duct in the pancreatic tail. A 1.7 cm benign-appearing cyst is also in the pancreatic tail which is stable since previous studies. No evidence of mass within the pancreatic head. 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 Venous collaterals noted within the gastrosplenic ligament. No definite evidence of celiac axis or SMA involvement. Spleen:  Within normal limits in size and appearance. Adrenals/Urinary Tract: No masses identified. Tiny hemorrhagic cyst noted in midpole of right kidney and tiny simple cyst seen in upper pole of left kidney. No evidence of hydronephrosis. Stomach/Bowel: Visualized portions within the abdomen are unremarkable. Vascular/Lymphatic: Mild portacaval lymphadenopathy seen measuring 1.6 cm on image 19/12. No other pathologically enlarged lymph nodes identified. No abdominal aortic aneurysm.  See pancreatic section above. Other:  None. Musculoskeletal:  No suspicious bone lesions identified. IMPRESSION: Diffuse hepatic metastases. Intrahepatic biliary dilatation the left hepatic lobe, which appears to be due to a small central left hepatic lobe metastasis. Mild dilatation of proximal common bile duct seen with smooth tapering distally, and no evidence of distal common bile duct obstruction. 4.8 x  2.9 cm ill-defined mass in the pancreatic body with proximal pancreatic ductal dilatation. This is highly suspicious for primary pancreatic carcinoma. Mild portacaval lymphadenopathy, suspicious for metastatic disease. These results will be called to the ordering clinician or representative by the Radiologist Assistant, and communication documented in the PACS or zVision Dashboard. Electronically Signed   By: Earle Gell M.D.   On: 12/09/2016 11:35    Labs:  CBC: No results for input(s): WBC, HGB, HCT, PLT in the last 8760 hours.  COAGS: No results for  input(s): INR, APTT in the last 8760 hours.  BMP: No results for input(s): NA, K, CL, CO2, GLUCOSE, BUN, CALCIUM, CREATININE, GFRNONAA, GFRAA in the last 8760 hours.  Invalid input(s): CMP  LIVER FUNCTION TESTS: No results for input(s): BILITOT, AST, ALT, ALKPHOS, PROT, ALBUMIN in the last 8760 hours.  TUMOR MARKERS: No results for input(s): AFPTM, CEA, CA199, CHROMGRNA in the last 8760 hours.  Assessment and Plan:  Hx Colon Ca New abd pain Biliary obstruction MRCP reveals panc mass and liver lesions Now scheduled for liver lesion bx Risks and Benefits discussed with the patient including, but not limited to bleeding, infection, damage to adjacent structures or low yield requiring additional tests. All of the patient's questions were answered, patient is agreeable to proceed. Consent signed and in chart.   Thank you for this interesting consult.  I greatly enjoyed meeting April Franklin and look forward to participating in their care.  A copy of this report was sent to the requesting provider on this date.  Electronically Signed: Monia Sabal A 12/30/2016, 12:32 PM   I spent a total of  30 Minutes   in face to face in clinical consultation, greater than 50% of which was counseling/coordinating care for liver lesion biopsy

## 2016-12-30 NOTE — Sedation Documentation (Signed)
Patient denies pain and is resting comfortably.  

## 2016-12-30 NOTE — Discharge Instructions (Signed)
Liver Biopsy, Care After °Introduction °These instructions give you information on caring for yourself after your procedure. Your doctor may also give you more specific instructions. Call your doctor if you have any problems or questions after your procedure. °Follow these instructions at home: °· Rest at home for 1-2 days or as told by your doctor. °· Have someone stay with you for at least 24 hours. °· Do not do these things in the first 24 hours: °¨ Drive. °¨ Use machinery. °¨ Take care of other people. °¨ Sign legal documents. °¨ Take a bath or shower. °· There are many different ways to close and cover a cut (incision). For example, a cut can be closed with stitches, skin glue, or adhesive strips. Follow your doctor's instructions on: °¨ Taking care of your cut. °¨ Changing and removing your bandage (dressing). °¨ Removing whatever was used to close your cut. °· Do not drink alcohol in the first week. °· Do not lift more than 5 pounds or play contact sports for the first 2 weeks. °· Take medicines only as told by your doctor. For 1 week, do not take medicine that has aspirin in it or medicines like ibuprofen. °· Get your test results. °Contact a doctor if: °· A cut bleeds and leaves more than just a small spot of blood. °· A cut is red, puffs up (swells), or hurts more than before. °· Fluid or something else comes from a cut. °· A cut smells bad. °· You have a fever or chills. °Get help right away if: °· You have swelling, bloating, or pain in your belly (abdomen). °· You get dizzy or faint. °· You have a rash. °· You feel sick to your stomach (nauseous) or throw up (vomit). °· You have trouble breathing, feel short of breath, or feel faint. °· Your chest hurts. °· You have problems talking or seeing. °· You have trouble balancing or moving your arms or legs. °This information is not intended to replace advice given to you by your health care provider. Make sure you discuss any questions you have with your  health care provider. °Document Released: 08/20/2008 Document Revised: 04/18/2016 Document Reviewed: 01/07/2014 °© 2017 Elsevier ° °

## 2017-01-03 DIAGNOSIS — N3001 Acute cystitis with hematuria: Secondary | ICD-10-CM | POA: Diagnosis not present

## 2017-01-03 DIAGNOSIS — Z794 Long term (current) use of insulin: Secondary | ICD-10-CM | POA: Insufficient documentation

## 2017-01-03 DIAGNOSIS — E119 Type 2 diabetes mellitus without complications: Secondary | ICD-10-CM | POA: Insufficient documentation

## 2017-01-03 DIAGNOSIS — I10 Essential (primary) hypertension: Secondary | ICD-10-CM | POA: Insufficient documentation

## 2017-01-03 DIAGNOSIS — Z79899 Other long term (current) drug therapy: Secondary | ICD-10-CM | POA: Insufficient documentation

## 2017-01-03 DIAGNOSIS — Z85038 Personal history of other malignant neoplasm of large intestine: Secondary | ICD-10-CM | POA: Insufficient documentation

## 2017-01-03 DIAGNOSIS — R42 Dizziness and giddiness: Secondary | ICD-10-CM | POA: Diagnosis not present

## 2017-01-03 DIAGNOSIS — R51 Headache: Secondary | ICD-10-CM | POA: Diagnosis present

## 2017-01-04 ENCOUNTER — Emergency Department (HOSPITAL_COMMUNITY): Payer: Medicare Other

## 2017-01-04 ENCOUNTER — Encounter (HOSPITAL_COMMUNITY): Payer: Self-pay | Admitting: *Deleted

## 2017-01-04 ENCOUNTER — Emergency Department (HOSPITAL_COMMUNITY)
Admission: EM | Admit: 2017-01-04 | Discharge: 2017-01-04 | Disposition: A | Discharge status: Home / Self Care | Payer: Medicare Other | Attending: Emergency Medicine | Admitting: Emergency Medicine

## 2017-01-04 DIAGNOSIS — R519 Headache, unspecified: Secondary | ICD-10-CM

## 2017-01-04 DIAGNOSIS — R42 Dizziness and giddiness: Secondary | ICD-10-CM

## 2017-01-04 DIAGNOSIS — R51 Headache: Secondary | ICD-10-CM

## 2017-01-04 DIAGNOSIS — N3001 Acute cystitis with hematuria: Secondary | ICD-10-CM

## 2017-01-04 LAB — CBC WITH DIFFERENTIAL/PLATELET
BASOS ABS: 0 10*3/uL (ref 0.0–0.1)
Basophils Relative: 0 %
EOS PCT: 1 %
Eosinophils Absolute: 0.1 10*3/uL (ref 0.0–0.7)
HCT: 30.7 % — ABNORMAL LOW (ref 36.0–46.0)
HEMOGLOBIN: 10.4 g/dL — AB (ref 12.0–15.0)
LYMPHS PCT: 27 %
Lymphs Abs: 1.4 10*3/uL (ref 0.7–4.0)
MCH: 28.7 pg (ref 26.0–34.0)
MCHC: 33.9 g/dL (ref 30.0–36.0)
MCV: 84.6 fL (ref 78.0–100.0)
Monocytes Absolute: 0.5 10*3/uL (ref 0.1–1.0)
Monocytes Relative: 9 %
NEUTROS PCT: 63 %
Neutro Abs: 3.3 10*3/uL (ref 1.7–7.7)
PLATELETS: 146 10*3/uL — AB (ref 150–400)
RBC: 3.63 MIL/uL — AB (ref 3.87–5.11)
RDW: 13.8 % (ref 11.5–15.5)
WBC: 5.1 10*3/uL (ref 4.0–10.5)

## 2017-01-04 LAB — URINALYSIS, ROUTINE W REFLEX MICROSCOPIC
Bacteria, UA: NONE SEEN
Bilirubin Urine: NEGATIVE
KETONES UR: NEGATIVE mg/dL
Nitrite: NEGATIVE
PROTEIN: 100 mg/dL — AB
Specific Gravity, Urine: 1.029 (ref 1.005–1.030)
pH: 5 (ref 5.0–8.0)

## 2017-01-04 LAB — COMPREHENSIVE METABOLIC PANEL
ALT: 152 U/L — AB (ref 14–54)
AST: 119 U/L — AB (ref 15–41)
Albumin: 3.5 g/dL (ref 3.5–5.0)
Alkaline Phosphatase: 329 U/L — ABNORMAL HIGH (ref 38–126)
Anion gap: 14 (ref 5–15)
BUN: 15 mg/dL (ref 6–20)
CHLORIDE: 98 mmol/L — AB (ref 101–111)
CO2: 22 mmol/L (ref 22–32)
CREATININE: 0.9 mg/dL (ref 0.44–1.00)
Calcium: 10.3 mg/dL (ref 8.9–10.3)
GFR calc Af Amer: >60 mL/min (ref >60)
GFR calc non Af Amer: >60 mL/min (ref >60)
Glucose, Bld: 328 mg/dL — ABNORMAL HIGH (ref 65–99)
Potassium: 5.1 mmol/L (ref 3.5–5.1)
Sodium: 134 mmol/L — ABNORMAL LOW (ref 135–145)
Total Bilirubin: 1.6 mg/dL — ABNORMAL HIGH (ref 0.3–1.2)
Total Protein: 6 g/dL — ABNORMAL LOW (ref 6.5–8.1)

## 2017-01-04 LAB — CBG MONITORING, ED: Glucose-Capillary: 297 mg/dL — ABNORMAL HIGH (ref 65–99)

## 2017-01-04 MED ORDER — SODIUM CHLORIDE 0.9 % IV BOLUS (SEPSIS)
1000.0000 mL | Freq: Once | INTRAVENOUS | Status: AC
  Administered 2017-01-04: 1000 mL via INTRAVENOUS

## 2017-01-04 MED ORDER — FENTANYL CITRATE (PF) 100 MCG/2ML IJ SOLN
25.0000 ug | Freq: Once | INTRAMUSCULAR | Status: AC
  Administered 2017-01-04: 25 ug via INTRAVENOUS
  Filled 2017-01-04: qty 2

## 2017-01-04 MED ORDER — CEPHALEXIN 500 MG PO CAPS: 500.0000 mg | ORAL_CAPSULE | Freq: Two times a day (BID) | ORAL | 0 refills | Status: DC

## 2017-01-04 MED ORDER — CEPHALEXIN 250 MG PO CAPS
500.0000 mg | ORAL_CAPSULE | Freq: Once | ORAL | Status: AC
  Administered 2017-01-04: 500 mg via ORAL
  Filled 2017-01-04: qty 2

## 2017-01-04 NOTE — ED Notes (Signed)
Pt ambulatory to restroom with stand by assist, steady gait. Denies dizziness. Pt then transported to CT

## 2017-01-04 NOTE — ED Triage Notes (Signed)
The pt had a lung biopsy Monday.  Today she has had a headache and dizziness  When she  Tries to stand upright  Her lung bioopsy was on the lt

## 2017-01-04 NOTE — ED Notes (Signed)
Nurse starting IV and will get labs. 

## 2017-01-04 NOTE — ED Notes (Signed)
Per triage note pt had lung biopsy... Pt actually had liver biopsy, hx colon cancer. Pt has had HA and dizziness for past few days. No neuro deficits. A/OX4. Ambulatory.

## 2017-01-04 NOTE — ED Provider Notes (Signed)
By signing my name below, I, Dora Sims, attest that this documentation has been prepared under the direction and in the presence of physician practitioner, Delice Bison Ward, DO. Electronically Signed: Dora Sims, Scribe. 01/04/2017. 2:45 AM.  TIME SEEN: 2:45 AM  CHIEF COMPLAINT: Headache  HPI: HPI Comments: April Franklin is a 76 y.o. 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 She rates her headache at 6/10 and states it is only present in her right temple currently. She reports some associated lightheadedness that is worse upon standing. Per the post-op instructions from her biopsy, she was advised to be evaluated if she started experiencing dizziness or lightheadedness. Pt also notes a decreased appetite and some constipation since her biopsy; she believes she is constipated because she has not taken her Metformin since the procedure as she feels like she has not needed it. She notes she does not experience headaches often and her most recent one prior to her biopsy was "a long time ago." Pt has tried Tylenol for her headache with some transient improvement. She reports she "bumped" her head on a car a few days ago but denies any associated bruising/wounds or other recent head trauma. No anticoagulants or antiplatelets. She denies fever, chills, nausea, vomiting, abdominal pain, CP, SOB, numbness/tingling, focal weakness, neuro deficits, syncope, or any other associated symptoms.   ROS: See HPI Constitutional: no fever  Eyes: no drainage  ENT: no runny nose   Cardiovascular:  no chest pain  Resp: no SOB  GI: no vomiting GU: no dysuria Integumentary: no rash  Allergy: no hives  Musculoskeletal: no leg swelling  Neurological: no slurred speech ROS otherwise  negative  PAST MEDICAL HISTORY/PAST SURGICAL HISTORY:  Past Medical History:  Diagnosis Date  . Arthritis   . Colon cancer ([place])   . Diabetes mellitus   . Hypertension     MEDICATIONS:  Prior to Admission medications   Medication Sig Start Date End Date Taking? Authorizing Provider  glipiZIDE (GLUCOTROL) 10 MG tablet Take 10 mg by mouth daily.    Historical Provider, MD  Insulin Degludec (TRESIBA FLEXTOUCH) 200 UNIT/ML SOPN Inject 20 Units into the muscle every evening. After dinner 11/07/16   Historical Provider, MD  linagliptin (TRADJENTA) 5 MG TABS tablet Take 5 mg by mouth daily.    Historical Provider, MD  losartan (COZAAR) 50 MG tablet Take 50 mg by mouth daily.    Historical Provider, MD  metFORMIN (GLUCOPHAGE) 1000 MG tablet Take 1,000 mg by mouth daily with breakfast.    Historical Provider, MD    ALLERGIES:  No Known Allergies  SOCIAL HISTORY:  Social History  Substance Use Topics  . Smoking status: Never Smoker  . Smokeless tobacco: Never Used  . Alcohol use No    FAMILY HISTORY: No family history on file.  EXAM: BP 139/67   Pulse 64   Temp 97.8 F (36.6 C) (Oral)   Resp 18   Ht 5\' 2"  (1.575 m)   Wt 140 lb (63.5 kg)   SpO2 100%   BMI 25.61 kg/m  CONSTITUTIONAL: Alert and oriented and responds appropriately to questions. Well-appearing; well-nourished. Elderly HEAD: Normocephalic EYES: Conjunctivae clear, PERRL, EOMI ENT: normal nose; no rhinorrhea; moist mucous membranes NECK: Supple, no meningismus, no nuchal rigidity, no LAD  CARD: RRR; S1 and S2 appreciated; no murmurs, no clicks, no  rubs, no gallops RESP: Normal chest excursion without splinting or tachypnea; breath sounds clear and equal bilaterally; no wheezes, no rhonchi, no rales, no hypoxia or respiratory distress, speaking full sentences ABD/GI: Normal bowel sounds; non-distended; soft, non-tender, no rebound, no guarding, no peritoneal signs, no hepatosplenomegaly BACK:  The back appears  normal and is non-tender to palpation, there is no CVA tenderness EXT: Normal ROM in all joints; non-tender to palpation; no edema; normal capillary refill; no cyanosis, no calf tenderness or swelling    SKIN: Normal color for age and race; warm; no rash NEURO: Moves all extremities equally, sensation to light touch intact diffusely, cranial nerves II through XII intact, normal speech PSYCH: The patient's mood and manner are appropriate. Grooming and personal hygiene are appropriate.  MEDICAL DECISION MAKING: Patient here with complaints of mild headache and lightheadedness with standing. Did have a mild headache injury several days ago. No focal neurologic deficits. Does have history of colon cancer with metastasis to the liver. No recent infectious symptoms. States her appetite has been less than normal and her daughter does not think she is eating as well as she should. She stopped her metformin and insulin recently she fell she did not need it. Doubt stroke given no neurologic deficits. No vertigo. No numbness or focal weakness. Will obtain head CT to rule out intracranial hemorrhage, evaluate for any mass or metastasis. We'll obtain labs to evaluate for anemia, electrolyte abnormality. We'll check urine to see if there sign of urinary tract infection. We'll give IV fluids, small dose of fentanyl for headache. Doubt subarachnoid hemorrhage. Does not describe this as the worst headache of her life, severe sudden onset. Patient and family comfortable with this plan.  ED PROGRESS: Patient's labs show mild anemia with hemoglobin of 10.4. This is a drop from 73 which is what she had in 2016 but we don't have any recent labs. No hypotension here. Her LFTs are unremarkable. She is mildly hyperglycemic but not in DKA and I have advised her she needs to restart her diabetes medications immediately. Liver function tests are elevated which is expected because patient has liver metastasis. Abdominal exam is benign.  She denies abdominal pain and is nontender to palpation throughout the abdomen and has nondistended abdomen. Urine shows possible infection we will send urine culture and start her on Keflex. She reports feeling "much better". After education for her headache and IV fluids. Has been able to ambulate here without difficulty. CT of the head shows no intracranial hemorrhage. 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 I suspect this is more likely chronic and not the cause of her symptoms. I do not feel she needs an emergent MRI but have discussed this finding with patient and daughter and recommended outpatient follow-up which they are comfortable with. She does have a PCP for follow-up. No bleeding seen. Patient and family comfortable with plan to be discharged home.   At this time, I do not feel there is any life-threatening condition present. I have reviewed and discussed all results (EKG, imaging, lab, urine as appropriate) and exam findings with patient/family. I have reviewed nursing notes and appropriate previous records.  I feel the patient is safe to be discharged home without further emergent workup and can continue workup as an outpatient as needed. Discussed usual and customary return precautions. Patient/family verbalize understanding and are comfortable with this plan.  Outpatient follow-up has been provided. All questions have been answered.    EKG  Interpretation  Date/Time:  Saturday January 04 2017 00:09:52 EST Ventricular Rate:  77 PR Interval:  148 QRS Duration: 70 QT Interval:  378 QTC Calculation: 427 R Axis:   17 Text Interpretation:  Normal sinus rhythm Moderate voltage criteria for LVH, may be normal variant Cannot rule out Inferior infarct , age undetermined Abnormal ECG No significant change since last tracing Confirmed by WARD,  DO, KRISTEN [phone]) on 01/04/2017 3:04:04 AM       I personally performed the services described in this  documentation, which was scribed in my presence. The recorded information has been reviewed and is accurate.     [place], DO 01/04/17 [phone]

## 2017-01-04 NOTE — ED Notes (Signed)
EDP at bedside  

## 2017-01-04 NOTE — ED Notes (Signed)
Rounded on lobby and gave out warm blankets and thanked patients for trusting [place] with their care

## 2017-01-05 LAB — URINE CULTURE

## 2017-01-08 DIAGNOSIS — R51 Headache: Secondary | ICD-10-CM | POA: Diagnosis not present

## 2017-01-08 DIAGNOSIS — R630 Anorexia: Secondary | ICD-10-CM | POA: Diagnosis not present

## 2017-01-08 DIAGNOSIS — R42 Dizziness and giddiness: Secondary | ICD-10-CM | POA: Diagnosis not present

## 2017-01-08 DIAGNOSIS — C787 Secondary malignant neoplasm of liver and intrahepatic bile duct: Secondary | ICD-10-CM | POA: Diagnosis not present

## 2017-01-08 DIAGNOSIS — C259 Malignant neoplasm of pancreas, unspecified: Secondary | ICD-10-CM | POA: Diagnosis not present

## 2017-01-09 ENCOUNTER — Emergency Department (HOSPITAL_COMMUNITY): Payer: Medicare Other

## 2017-01-09 ENCOUNTER — Inpatient Hospital Stay (HOSPITAL_COMMUNITY): Payer: Medicare Other

## 2017-01-09 ENCOUNTER — Inpatient Hospital Stay (HOSPITAL_COMMUNITY)
Admission: EM | Admit: 2017-01-09 | Discharge: 2017-01-31 | DRG: 003 | Disposition: A | Discharge status: Home Health | Payer: Medicare Other | Attending: Neurology | Admitting: Neurology

## 2017-01-09 ENCOUNTER — Encounter (HOSPITAL_COMMUNITY): Payer: Self-pay

## 2017-01-09 DIAGNOSIS — J9621 Acute and chronic respiratory failure with hypoxia: Secondary | ICD-10-CM | POA: Diagnosis not present

## 2017-01-09 DIAGNOSIS — Z459 Encounter for adjustment and management of unspecified implanted device: Secondary | ICD-10-CM | POA: Diagnosis not present

## 2017-01-09 DIAGNOSIS — Z4682 Encounter for fitting and adjustment of non-vascular catheter: Secondary | ICD-10-CM | POA: Diagnosis not present

## 2017-01-09 DIAGNOSIS — C787 Secondary malignant neoplasm of liver and intrahepatic bile duct: Secondary | ICD-10-CM | POA: Diagnosis present

## 2017-01-09 DIAGNOSIS — E118 Type 2 diabetes mellitus with unspecified complications: Secondary | ICD-10-CM

## 2017-01-09 DIAGNOSIS — I639 Cerebral infarction, unspecified: Secondary | ICD-10-CM

## 2017-01-09 DIAGNOSIS — Z430 Encounter for attention to tracheostomy: Secondary | ICD-10-CM | POA: Diagnosis not present

## 2017-01-09 DIAGNOSIS — R9401 Abnormal electroencephalogram [EEG]: Secondary | ICD-10-CM | POA: Diagnosis present

## 2017-01-09 DIAGNOSIS — Z431 Encounter for attention to gastrostomy: Secondary | ICD-10-CM | POA: Diagnosis not present

## 2017-01-09 DIAGNOSIS — I63011 Cerebral infarction due to thrombosis of right vertebral artery: Secondary | ICD-10-CM | POA: Diagnosis not present

## 2017-01-09 DIAGNOSIS — R7989 Other specified abnormal findings of blood chemistry: Secondary | ICD-10-CM

## 2017-01-09 DIAGNOSIS — D649 Anemia, unspecified: Secondary | ICD-10-CM | POA: Diagnosis not present

## 2017-01-09 DIAGNOSIS — Z930 Tracheostomy status: Secondary | ICD-10-CM | POA: Diagnosis not present

## 2017-01-09 DIAGNOSIS — Z931 Gastrostomy status: Secondary | ICD-10-CM | POA: Diagnosis not present

## 2017-01-09 DIAGNOSIS — C7889 Secondary malignant neoplasm of other digestive organs: Secondary | ICD-10-CM | POA: Diagnosis present

## 2017-01-09 DIAGNOSIS — Z794 Long term (current) use of insulin: Secondary | ICD-10-CM | POA: Diagnosis not present

## 2017-01-09 DIAGNOSIS — R404 Transient alteration of awareness: Secondary | ICD-10-CM | POA: Diagnosis not present

## 2017-01-09 DIAGNOSIS — I248 Other forms of acute ischemic heart disease: Secondary | ICD-10-CM | POA: Diagnosis present

## 2017-01-09 DIAGNOSIS — Z85038 Personal history of other malignant neoplasm of large intestine: Secondary | ICD-10-CM | POA: Diagnosis not present

## 2017-01-09 DIAGNOSIS — G9349 Other encephalopathy: Secondary | ICD-10-CM | POA: Diagnosis not present

## 2017-01-09 DIAGNOSIS — E1165 Type 2 diabetes mellitus with hyperglycemia: Secondary | ICD-10-CM | POA: Diagnosis not present

## 2017-01-09 DIAGNOSIS — C799 Secondary malignant neoplasm of unspecified site: Secondary | ICD-10-CM | POA: Diagnosis not present

## 2017-01-09 DIAGNOSIS — R748 Abnormal levels of other serum enzymes: Secondary | ICD-10-CM | POA: Diagnosis not present

## 2017-01-09 DIAGNOSIS — Z79891 Long term (current) use of opiate analgesic: Secondary | ICD-10-CM

## 2017-01-09 DIAGNOSIS — I6789 Other cerebrovascular disease: Secondary | ICD-10-CM | POA: Diagnosis not present

## 2017-01-09 DIAGNOSIS — R1310 Dysphagia, unspecified: Secondary | ICD-10-CM | POA: Diagnosis not present

## 2017-01-09 DIAGNOSIS — R509 Fever, unspecified: Secondary | ICD-10-CM | POA: Diagnosis not present

## 2017-01-09 DIAGNOSIS — I6310 Cerebral infarction due to embolism of unspecified precerebral artery: Secondary | ICD-10-CM | POA: Diagnosis not present

## 2017-01-09 DIAGNOSIS — T85598A Other mechanical complication of other gastrointestinal prosthetic devices, implants and grafts, initial encounter: Secondary | ICD-10-CM | POA: Diagnosis not present

## 2017-01-09 DIAGNOSIS — R0603 Acute respiratory distress: Secondary | ICD-10-CM | POA: Diagnosis not present

## 2017-01-09 DIAGNOSIS — J690 Pneumonitis due to inhalation of food and vomit: Secondary | ICD-10-CM | POA: Diagnosis not present

## 2017-01-09 DIAGNOSIS — E119 Type 2 diabetes mellitus without complications: Secondary | ICD-10-CM | POA: Diagnosis not present

## 2017-01-09 DIAGNOSIS — R64 Cachexia: Secondary | ICD-10-CM | POA: Diagnosis not present

## 2017-01-09 DIAGNOSIS — I6621 Occlusion and stenosis of right posterior cerebral artery: Secondary | ICD-10-CM | POA: Diagnosis present

## 2017-01-09 DIAGNOSIS — I82431 Acute embolism and thrombosis of right popliteal vein: Secondary | ICD-10-CM | POA: Diagnosis not present

## 2017-01-09 DIAGNOSIS — N390 Urinary tract infection, site not specified: Secondary | ICD-10-CM | POA: Diagnosis present

## 2017-01-09 DIAGNOSIS — I6322 Cerebral infarction due to unspecified occlusion or stenosis of basilar arteries: Principal | ICD-10-CM | POA: Diagnosis present

## 2017-01-09 DIAGNOSIS — C189 Malignant neoplasm of colon, unspecified: Secondary | ICD-10-CM | POA: Diagnosis not present

## 2017-01-09 DIAGNOSIS — Z9911 Dependence on respirator [ventilator] status: Secondary | ICD-10-CM

## 2017-01-09 DIAGNOSIS — Z9189 Other specified personal risk factors, not elsewhere classified: Secondary | ICD-10-CM | POA: Diagnosis not present

## 2017-01-09 DIAGNOSIS — Z66 Do not resuscitate: Secondary | ICD-10-CM | POA: Diagnosis not present

## 2017-01-09 DIAGNOSIS — E870 Hyperosmolality and hypernatremia: Secondary | ICD-10-CM | POA: Diagnosis not present

## 2017-01-09 DIAGNOSIS — R1110 Vomiting, unspecified: Secondary | ICD-10-CM | POA: Diagnosis not present

## 2017-01-09 DIAGNOSIS — Z01818 Encounter for other preprocedural examination: Secondary | ICD-10-CM

## 2017-01-09 DIAGNOSIS — R233 Spontaneous ecchymoses: Secondary | ICD-10-CM | POA: Diagnosis present

## 2017-01-09 DIAGNOSIS — J9620 Acute and chronic respiratory failure, unspecified whether with hypoxia or hypercapnia: Secondary | ICD-10-CM | POA: Diagnosis present

## 2017-01-09 DIAGNOSIS — I82401 Acute embolism and thrombosis of unspecified deep veins of right lower extremity: Secondary | ICD-10-CM | POA: Diagnosis not present

## 2017-01-09 DIAGNOSIS — I6349 Cerebral infarction due to embolism of other cerebral artery: Secondary | ICD-10-CM | POA: Diagnosis not present

## 2017-01-09 DIAGNOSIS — R4182 Altered mental status, unspecified: Secondary | ICD-10-CM

## 2017-01-09 DIAGNOSIS — R42 Dizziness and giddiness: Secondary | ICD-10-CM

## 2017-01-09 DIAGNOSIS — J9690 Respiratory failure, unspecified, unspecified whether with hypoxia or hypercapnia: Secondary | ICD-10-CM | POA: Diagnosis not present

## 2017-01-09 DIAGNOSIS — Z8673 Personal history of transient ischemic attack (TIA), and cerebral infarction without residual deficits: Secondary | ICD-10-CM | POA: Diagnosis not present

## 2017-01-09 DIAGNOSIS — Y92230 Patient room in hospital as the place of occurrence of the external cause: Secondary | ICD-10-CM | POA: Diagnosis not present

## 2017-01-09 DIAGNOSIS — Z79899 Other long term (current) drug therapy: Secondary | ICD-10-CM

## 2017-01-09 DIAGNOSIS — K9423 Gastrostomy malfunction: Secondary | ICD-10-CM

## 2017-01-09 DIAGNOSIS — I10 Essential (primary) hypertension: Secondary | ICD-10-CM | POA: Diagnosis present

## 2017-01-09 DIAGNOSIS — Z9049 Acquired absence of other specified parts of digestive tract: Secondary | ICD-10-CM | POA: Diagnosis not present

## 2017-01-09 DIAGNOSIS — D638 Anemia in other chronic diseases classified elsewhere: Secondary | ICD-10-CM | POA: Diagnosis present

## 2017-01-09 DIAGNOSIS — R197 Diarrhea, unspecified: Secondary | ICD-10-CM | POA: Diagnosis not present

## 2017-01-09 DIAGNOSIS — L8990 Pressure ulcer of unspecified site, unspecified stage: Secondary | ICD-10-CM | POA: Insufficient documentation

## 2017-01-09 DIAGNOSIS — R51 Headache: Secondary | ICD-10-CM

## 2017-01-09 DIAGNOSIS — R740 Nonspecific elevation of levels of transaminase and lactic acid dehydrogenase [LDH]: Secondary | ICD-10-CM | POA: Diagnosis present

## 2017-01-09 DIAGNOSIS — D6869 Other thrombophilia: Secondary | ICD-10-CM | POA: Diagnosis present

## 2017-01-09 DIAGNOSIS — R531 Weakness: Secondary | ICD-10-CM | POA: Diagnosis not present

## 2017-01-09 DIAGNOSIS — R918 Other nonspecific abnormal finding of lung field: Secondary | ICD-10-CM | POA: Diagnosis not present

## 2017-01-09 DIAGNOSIS — R1311 Dysphagia, oral phase: Secondary | ICD-10-CM | POA: Diagnosis not present

## 2017-01-09 DIAGNOSIS — E785 Hyperlipidemia, unspecified: Secondary | ICD-10-CM | POA: Diagnosis present

## 2017-01-09 DIAGNOSIS — G9340 Encephalopathy, unspecified: Secondary | ICD-10-CM

## 2017-01-09 DIAGNOSIS — I6340 Cerebral infarction due to embolism of unspecified cerebral artery: Secondary | ICD-10-CM | POA: Insufficient documentation

## 2017-01-09 DIAGNOSIS — R0989 Other specified symptoms and signs involving the circulatory and respiratory systems: Secondary | ICD-10-CM | POA: Diagnosis not present

## 2017-01-09 DIAGNOSIS — Z6825 Body mass index (BMI) 25.0-25.9, adult: Secondary | ICD-10-CM

## 2017-01-09 DIAGNOSIS — I6300 Cerebral infarction due to thrombosis of unspecified precerebral artery: Secondary | ICD-10-CM

## 2017-01-09 DIAGNOSIS — I69998 Other sequelae following unspecified cerebrovascular disease: Secondary | ICD-10-CM | POA: Diagnosis not present

## 2017-01-09 DIAGNOSIS — M1990 Unspecified osteoarthritis, unspecified site: Secondary | ICD-10-CM | POA: Diagnosis present

## 2017-01-09 DIAGNOSIS — J9600 Acute respiratory failure, unspecified whether with hypoxia or hypercapnia: Secondary | ICD-10-CM | POA: Diagnosis not present

## 2017-01-09 DIAGNOSIS — I651 Occlusion and stenosis of basilar artery: Secondary | ICD-10-CM | POA: Diagnosis not present

## 2017-01-09 DIAGNOSIS — R188 Other ascites: Secondary | ICD-10-CM | POA: Diagnosis not present

## 2017-01-09 DIAGNOSIS — I6312 Cerebral infarction due to embolism of basilar artery: Secondary | ICD-10-CM | POA: Diagnosis not present

## 2017-01-09 DIAGNOSIS — R519 Headache, unspecified: Secondary | ICD-10-CM

## 2017-01-09 DIAGNOSIS — E876 Hypokalemia: Secondary | ICD-10-CM | POA: Diagnosis not present

## 2017-01-09 DIAGNOSIS — I69391 Dysphagia following cerebral infarction: Secondary | ICD-10-CM | POA: Diagnosis not present

## 2017-01-09 DIAGNOSIS — R778 Other specified abnormalities of plasma proteins: Secondary | ICD-10-CM | POA: Diagnosis not present

## 2017-01-09 HISTORY — DX: Hyperlipidemia, unspecified: E78.5

## 2017-01-09 HISTORY — DX: Urinary tract infection, site not specified: N39.0

## 2017-01-09 LAB — CBC WITH DIFFERENTIAL/PLATELET
BASOS ABS: 0 10*3/uL (ref 0.0–0.1)
BASOS PCT: 0 %
EOS PCT: 1 %
Eosinophils Absolute: 0.1 10*3/uL (ref 0.0–0.7)
HCT: 31.2 % — ABNORMAL LOW (ref 36.0–46.0)
Hemoglobin: 10.6 g/dL — ABNORMAL LOW (ref 12.0–15.0)
LYMPHS PCT: 23 %
Lymphs Abs: 1.3 10*3/uL (ref 0.7–4.0)
MCH: 28.4 pg (ref 26.0–34.0)
MCHC: 34 g/dL (ref 30.0–36.0)
MCV: 83.6 fL (ref 78.0–100.0)
Monocytes Absolute: 0.6 10*3/uL (ref 0.1–1.0)
Monocytes Relative: 10 %
Neutro Abs: 4 10*3/uL (ref 1.7–7.7)
Neutrophils Relative %: 66 %
PLATELETS: 127 10*3/uL — AB (ref 150–400)
RBC: 3.73 MIL/uL — AB (ref 3.87–5.11)
RDW: 13.7 % (ref 11.5–15.5)
WBC: 5.9 10*3/uL (ref 4.0–10.5)

## 2017-01-09 LAB — COMPREHENSIVE METABOLIC PANEL
ALT: 118 U/L — ABNORMAL HIGH (ref 14–54)
ANION GAP: 8 (ref 5–15)
AST: 87 U/L — ABNORMAL HIGH (ref 15–41)
Albumin: 3.3 g/dL — ABNORMAL LOW (ref 3.5–5.0)
Alkaline Phosphatase: 337 U/L — ABNORMAL HIGH (ref 38–126)
BILIRUBIN TOTAL: 1.3 mg/dL — AB (ref 0.3–1.2)
BUN: 15 mg/dL (ref 6–20)
CO2: 25 mmol/L (ref 22–32)
Calcium: 9.7 mg/dL (ref 8.9–10.3)
Chloride: 104 mmol/L (ref 101–111)
Creatinine, Ser: 0.83 mg/dL (ref 0.44–1.00)
GFR calc Af Amer: >60 mL/min (ref >60)
GFR calc non Af Amer: >60 mL/min (ref >60)
Glucose, Bld: 120 mg/dL — ABNORMAL HIGH (ref 65–99)
POTASSIUM: 3.5 mmol/L (ref 3.5–5.1)
Sodium: 137 mmol/L (ref 135–145)
TOTAL PROTEIN: 6.2 g/dL — AB (ref 6.5–8.1)

## 2017-01-09 LAB — I-STAT CG4 LACTIC ACID, ED
Lactic Acid, Venous: 0.96 mmol/L (ref 0.5–1.9)
Lactic Acid, Venous: 1.29 mmol/L (ref 0.5–1.9)

## 2017-01-09 LAB — GLUCOSE, CAPILLARY: Glucose-Capillary: 203 mg/dL — ABNORMAL HIGH (ref 65–99)

## 2017-01-09 LAB — TROPONIN I: Troponin I: 1.1 ng/mL (ref <0.03)

## 2017-01-09 LAB — VITAMIN B12: VITAMIN B 12: 2380 pg/mL — AB (ref 180–914)

## 2017-01-09 LAB — I-STAT TROPONIN, ED: TROPONIN I, POC: 0.85 ng/mL — AB (ref 0.00–0.08)

## 2017-01-09 LAB — CBG MONITORING, ED: Glucose-Capillary: 144 mg/dL — ABNORMAL HIGH (ref 65–99)

## 2017-01-09 LAB — TSH: TSH: 1.378 u[IU]/mL (ref 0.350–4.500)

## 2017-01-09 LAB — AMMONIA: AMMONIA: 26 umol/L (ref 9–35)

## 2017-01-09 MED ORDER — SODIUM CHLORIDE 0.9 % IV SOLN
INTRAVENOUS | Status: DC
  Filled 2017-01-09 (×2): qty 1000

## 2017-01-09 MED ORDER — ACETAMINOPHEN 650 MG RE SUPP: 650.0000 mg | RECTAL | Status: DC | PRN

## 2017-01-09 MED ORDER — ONDANSETRON HCL 4 MG/2ML IJ SOLN
4.0000 mg | Freq: Once | INTRAMUSCULAR | Status: AC
  Administered 2017-01-09: 4 mg via INTRAVENOUS
  Filled 2017-01-09: qty 2

## 2017-01-09 MED ORDER — INSULIN ASPART 100 UNIT/ML ~~LOC~~ SOLN
0.0000 [IU] | SUBCUTANEOUS | Status: DC
  Administered 2017-01-10: 2 [IU] via SUBCUTANEOUS
  Administered 2017-01-10: 3 [IU] via SUBCUTANEOUS
  Administered 2017-01-10 (×2): 2 [IU] via SUBCUTANEOUS
  Administered 2017-01-10: 3 [IU] via SUBCUTANEOUS
  Administered 2017-01-11 (×2): 1 [IU] via SUBCUTANEOUS
  Administered 2017-01-11 (×4): 2 [IU] via SUBCUTANEOUS
  Administered 2017-01-12: 1 [IU] via SUBCUTANEOUS
  Administered 2017-01-12 (×3): 2 [IU] via SUBCUTANEOUS
  Administered 2017-01-12: 3 [IU] via SUBCUTANEOUS
  Administered 2017-01-12 – 2017-01-13 (×3): 2 [IU] via SUBCUTANEOUS
  Administered 2017-01-13: 5 [IU] via SUBCUTANEOUS
  Administered 2017-01-13: 2 [IU] via SUBCUTANEOUS
  Administered 2017-01-13: 3 [IU] via SUBCUTANEOUS
  Administered 2017-01-13 – 2017-01-14 (×3): 5 [IU] via SUBCUTANEOUS

## 2017-01-09 MED ORDER — POTASSIUM CHLORIDE 2 MEQ/ML IV SOLN
INTRAVENOUS | Status: DC
  Administered 2017-01-09 – 2017-01-12 (×6): via INTRAVENOUS
  Filled 2017-01-09 (×9): qty 1000

## 2017-01-09 MED ORDER — STROKE: EARLY STAGES OF RECOVERY BOOK
Freq: Once | Status: DC
  Filled 2017-01-09: qty 1

## 2017-01-09 MED ORDER — ENOXAPARIN SODIUM 40 MG/0.4ML ~~LOC~~ SOLN
40.0000 mg | SUBCUTANEOUS | Status: DC
  Administered 2017-01-10 – 2017-01-19 (×10): 40 mg via SUBCUTANEOUS
  Filled 2017-01-09 (×10): qty 0.4

## 2017-01-09 MED ORDER — SENNOSIDES-DOCUSATE SODIUM 8.6-50 MG PO TABS
1.0000 | ORAL_TABLET | Freq: Every evening | ORAL | Status: DC | PRN
Start: 2017-01-09 — End: 2017-01-13

## 2017-01-09 MED ORDER — HYDRALAZINE HCL 20 MG/ML IJ SOLN: 5.0000 mg | Freq: Four times a day (QID) | INTRAMUSCULAR | Status: DC | PRN

## 2017-01-09 MED ORDER — LORAZEPAM 2 MG/ML IJ SOLN
0.5000 mg | Freq: Once | INTRAMUSCULAR | Status: AC
  Administered 2017-01-09: 0.5 mg via INTRAVENOUS
  Filled 2017-01-09: qty 1

## 2017-01-09 MED ORDER — INSULIN GLARGINE 100 UNIT/ML ~~LOC~~ SOLN
5.0000 [IU] | Freq: Every day | SUBCUTANEOUS | Status: DC
  Administered 2017-01-10 – 2017-01-12 (×4): 5 [IU] via SUBCUTANEOUS
  Filled 2017-01-09 (×4): qty 0.05

## 2017-01-09 MED ORDER — ASPIRIN 325 MG PO TABS
325.0000 mg | ORAL_TABLET | Freq: Every day | ORAL | Status: DC
  Administered 2017-01-11 – 2017-01-19 (×7): 325 mg via ORAL
  Filled 2017-01-09 (×7): qty 1

## 2017-01-09 MED ORDER — SODIUM CHLORIDE 0.9 % IV SOLN: INTRAVENOUS | Status: DC

## 2017-01-09 MED ORDER — STROKE: EARLY STAGES OF RECOVERY BOOK
Freq: Once | Status: AC
  Administered 2017-01-09: 20:00:00
  Filled 2017-01-09: qty 1

## 2017-01-09 MED ORDER — ASPIRIN 300 MG RE SUPP
300.0000 mg | Freq: Every day | RECTAL | Status: DC
  Administered 2017-01-09 – 2017-01-18 (×4): 300 mg via RECTAL
  Filled 2017-01-09 (×4): qty 1

## 2017-01-09 MED ORDER — ACETAMINOPHEN 160 MG/5ML PO SOLN: 650.0000 mg | ORAL | Status: DC | PRN

## 2017-01-09 MED ORDER — ACETAMINOPHEN 325 MG PO TABS: 650.0000 mg | ORAL_TABLET | ORAL | Status: DC | PRN

## 2017-01-09 NOTE — ED Notes (Signed)
Patient transported to CT 

## 2017-01-09 NOTE — ED Notes (Signed)
ED Provider at bedside.  EDP REES PRESENT SPEAKING WITH PT AND FAMILY.

## 2017-01-09 NOTE — ED Notes (Addendum)
PT LETHARGIC HOWEVER WILL RESPOND TO VOICE. PT ABLE TO IDENTIFY EDP REES AND ONE FAMILY MEMBER HOWEVER OTHER FAMILY MEMBER NOT SURE. FAMILY INDICATED PT TOOK TRAMADOL AT 9-10 AM. PT ABLE TO FOLLOW MOST COMMANDS WITH ENCOURAGEMENT. PT C/O OF DIZZINESS AND HEADACHE FOR SEVERAL DAYS. PT SAW PCP YESTERDAY HOWEVER DID NOT PRESENT AS SHE IS TODAY. EDP REES EVALUATING PT. NOT CODE STROKE AT THIS TIME.

## 2017-01-09 NOTE — ED Notes (Signed)
ADMISSION Provider at bedside. 

## 2017-01-09 NOTE — Consult Note (Signed)
Neurology Consultation Reason for Consult: Altered mental status Referring Physician: Tat, D  CC: Altered mental status  History is obtained from: Patient's family  HPI: April Franklin is a 76 y.o. female with a history of colon cancer who has been having progressive altered mental status and confusion over the past several weeks. It was only mild until 2 days ago at which point it started getting progressively worse, then this morning when she was checked on, she did not arouse appropriately and therefore she was brought into the emergency department.  She also has been complaining of headaches over the past few weeks as well.  On MRI, she has a area of hypodensity in the right parietal region consistent with a subacute infarct.  She also has been complaining of double vision.  LKW: At least several days ago tpa given?: no, outside of window    ROS:  Unable to obtain due to altered mental status.   Past Medical History:  Diagnosis Date  . Arthritis   . Colon cancer ([place])   . Diabetes mellitus   . Hyperlipidemia   . Hypertension   . UTI (urinary tract infection)      Family history: Unable to obtain due to altered mental status  Social History:  reports that she has never smoked. She has never used smokeless tobacco. She reports that she does not drink alcohol or use drugs.   Exam: Current vital signs: BP (!) 154/61 (BP Location: Left Arm)   Pulse 70   Temp 99 F (37.2 C) (Axillary)   Resp 20   Ht 5\' 2"  (1.575 m)   Wt 63 kg (138 lb 14.2 oz)   SpO2 96%   BMI 25.40 kg/m  Vital signs in last 24 hours: Temp:  [98.3 F (36.8 C)-99.2 F (37.3 C)] 99 F (37.2 C) (02/15 2049) Pulse Rate:  [50-70] 70 (02/15 2049) Resp:  [13-22] 20 (02/15 2049) BP: (121-186)/(52-89) 154/61 (02/15 2049) SpO2:  [93 %-98 %] 96 % (02/15 2049) Weight:  [63 kg (138 lb 14.2 oz)-63.5 kg (140 lb)] 63 kg (138 lb 14.2 oz) (02/15 2049)   Physical Exam  Constitutional: Appears well-developed  and well-nourished.  Psych: Affect appropriate to situation Eyes: No scleral injection HENT: No OP obstrucion Head: Normocephalic.  Cardiovascular: Normal rate and regular rhythm.  Respiratory: Effort normal and breath sounds normal to anterior ascultation GI: Soft.  No distension. There is no tenderness.  Skin: WDI  Neuro: Mental Status: Patient keeps eyes closed, despite following commands. She does follow commands to wiggle toes bilaterally, but is less cooperative with other commands. Cranial Nerves: II: She keeps eyes forcefully closed when I'm trying to check threat, which makes it very difficult.. Pupils are equal, round, and reactive to light.   III,IV, VI: She has a disconjugate gaze with eyes looking towards the left. V: Facial sensation is symmetric to temperature VII: Facial movement is symmetric.  VIII: hearing is intact to voice X: Uvula elevates symmetrically XI: Shoulder shrug is symmetric. XII: tongue is midline without atrophy or fasciculations.  Motor: Tone is normal. Bulk is normal. She moves all extremities in response to noxious stimuli, with quasi-purposeful movements bilaterally. Sensory: Sensation is symmetric to light touch and temperature in the arms and legs. Cerebellar: She does not comply  I have reviewed labs in epic and the results pertinent to this consultation are: Elevated transaminases with a normal ammonia  I have reviewed the images obtained: CT head right parietal  Hypodensity  Impression:  76 year old female with a history of colon cancer with hypodensity on her CT. The appearance is most consistent with stroke, but with her history of progressive headaches, progressive mental status changes, if this represents metastasis. Either way, her symptoms seem out of proportion to her current imaging and will need to get further testing.   Recommendations: 1) MRI brain w/wo contrast. MRA head 2) EEG 3) If MRI reveals that this is stroke, stroke  eworkup.  4) Neurology will follow.    Roland Rack, MD [facility] [phone]  If 7pm- 7am, please page neurology on call as listed in [place].

## 2017-01-09 NOTE — H&P (Addendum)
History and Physical  April Franklin [id number] DOB: 09/10/1941 DOA: 01/09/2017   PCP: [name], MD   Patient coming from: Home  Chief Complaint: somnolence  HPI:  April Franklin is a 76 y.o. female with medical history of diabetes mellitus, hypertension, colon cancer status post subtotal colectomy with ileorectal anastomosis presented with somnolence and increasing confusion for 4 days. The patient had a liver biopsy of hepatic masses that were noted on MRCP (12/09/2016). The biopsy was done on 12/30/2016. Since the biopsy, the patient has been having intermittent headaches, feeling dizzy and having some generalized weakness. She visited emergency department on 01/04/2017. She was discharged with cephalexin for treatment of UTI. CT of the brain during the ED visit showed a small subacute to chronic infarct in the right parietal lobe.. The patient saw her primary care provider 2 days prior to this admission, and the patient was prescribed tramadol for her headaches. On 01/05/2017, the patient's family noted that she was a little confused and complained of double vision. Her confusion remained fairly stable until the evening of 01/08/2017 where she was more incoherent according to the family. On the morning of 01/09/2017, the patient took a dose of tramadol and became more somnolent, but was arousable. As a result, the patient was brought to emergency room for further evaluation.  In the emergency department, the patient was somnolent but arousable. She was afebrile and hemodynamically stable saturating well on room air. BMP was unremarkable. The patient was mildly hypercalcemic with a corrected calcium of 10.3. Platelets were 127,000. Ammonia was 26. Urinalysis showed 0-5 WBC. Point-of-care troponin was 0.85. CT of the brain shows subacute infarct of the right parietal lobe. Neurology was consulted and recommended transfer to [facility]. Patient failed her initial RN swallow screen in  ED  Assessment/Plan: Subacute infarct right parietal lobe --Appreciate Neurology Consult -PT/OT evaluation -Speech therapy eval -CT brain--subacute infarct right parietal lobe -MRI brain-- -MRA brain-- -Carotid Duplex-- -Echo-- -LDL-- -HbA1C-- -Antiplatelet--aspirin 325 mg daily -allow permissive HTN -hydralazine for SBP >220  Acute encephalopathy -Multifactorial including tramadol, stroke, and possible brain metastasis -MRI brain -Ammonia 26 -Serum B12 -TSH -Check EEG as tramadol decreases seizure threshold -Urinalysis negative for polyuria -Discontinue tramadol  Elevated troponin -No chest pain -EKG without concerning ischemic changes -Likely due to demand ischemia -Cycle troponins  Transaminasemia -due to hepatic mets -am LFTs  Diabetes mellitus type 2 -Reduced dose Lantus -NovoLog sliding scale -Holding metformin, glipizide, tradjenta -A1C  Hypertension -Holding losartan -Allowing permissive hypertension as discussed above  Hypercalcemia -Suspect hypercalcemia of malignancy -IV fluids         Past Medical History:  Diagnosis Date  . Arthritis   . Colon cancer ([place])   . Diabetes mellitus   . Hyperlipidemia   . Hypertension   . UTI (urinary tract infection)    Past Surgical History:  Procedure Laterality Date  . ABDOMINAL HYSTERECTOMY    . APPENDECTOMY    . CATARACT EXTRACTION    . COLON RESECTION    . COLONOSCOPY    . FLEXIBLE SIGMOIDOSCOPY N/A 10/10/2015   Procedure: FLEXIBLE SIGMOIDOSCOPY;  Surgeon: Garlan Fair, MD;  Location: [facility];  Service: Endoscopy;  Laterality: N/A;  UNSEDATED  . LEG SURGERY     Social History:  reports that she has never smoked. She has never used smokeless tobacco. She reports that she does not drink alcohol or use drugs.   History reviewed. No pertinent family history.   No Known Allergies  Prior to Admission medications   Medication Sig Start Date End Date Taking? Authorizing Provider   cephALEXin (KEFLEX) 500 MG capsule Take 1 capsule (500 mg total) by mouth 2 (two) times daily. 01/04/17  Yes Kristen N Ward, DO  glipiZIDE (GLUCOTROL) 10 MG tablet Take 10 mg by mouth daily.   Yes Historical Provider, MD  Insulin Degludec (TRESIBA FLEXTOUCH) 200 UNIT/ML SOPN Inject 20 Units into the muscle every evening. After dinner 11/07/16  Yes Historical Provider, MD  linagliptin (TRADJENTA) 5 MG TABS tablet Take 5 mg by mouth daily.   Yes Historical Provider, MD  losartan (COZAAR) 50 MG tablet Take 50 mg by mouth daily.   Yes Historical Provider, MD  metFORMIN (GLUCOPHAGE) 1000 MG tablet Take 1,000 mg by mouth daily with breakfast.   Yes Historical Provider, MD  traMADol (ULTRAM) 50 MG tablet Take 50 mg by mouth every 6 (six) hours as needed for moderate pain.   Yes Historical Provider, MD    Review of Systems:  Other than denying headache, shortness breath, abdominal pain, remainder of review of systems is unobtainable secondary to patient's encephalopathy.  Physical Exam: Vitals:   01/09/17 1530 01/09/17 1600 01/09/17 1624 01/09/17 1630  BP: (!) 127/53 142/59  143/60  Pulse: (!) 56 (!) 58  (!) 59  Resp: 17 19  14   Temp:      TempSrc:      SpO2: 95% 96%  98%  Weight:   63.5 kg (140 lb)   Height:   5\' 2"  (1.575 m)    General:  Somnolent but arousable, NAD, nontoxic, pleasant/cooperative Head/Eye: No conjunctival hemorrhage, no icterus, [place]/AT, No nystagmus ENT:  No icterus,  No thrush, good dentition, no pharyngeal exudate Neck:  No masses, no lymphadenpathy, no bruits CV:  RRR, no rub, no gallop, no S3 Lung:  Diminished breath sounds at the bases, but CTAB, good air movement, no wheeze, no rhonchi Abdomen: soft/NT, +BS, nondistended, no peritoneal signs Ext: No cyanosis, No rashes, No petechiae, No lymphangitis, No edema Neuro: CNII-XII intact, strength 4/5 in bilateral upper and lower extremities  Labs on Admission:  Basic Metabolic Panel:  Recent Labs Lab 01/04/17 0353  01/09/17 1356  NA 134* 137  K 5.1 3.5  CL 98* 104  CO2 22 25  GLUCOSE 328* 120*  BUN 15 15  CREATININE 0.90 0.83  CALCIUM 10.3 9.7   Liver Function Tests:  Recent Labs Lab 01/04/17 0353 01/09/17 1356  AST 119* 87*  ALT 152* 118*  ALKPHOS 329* 337*  BILITOT 1.6* 1.3*  PROT 6.0* 6.2*  ALBUMIN 3.5 3.3*   No results for input(s): LIPASE, AMYLASE in the last 168 hours.  Recent Labs Lab 01/09/17 1400  AMMONIA 26   CBC:  Recent Labs Lab 01/04/17 0353 01/09/17 1356  WBC 5.1 5.9  NEUTROABS 3.3 4.0  HGB 10.4* 10.6*  HCT 30.7* 31.2*  MCV 84.6 83.6  PLT 146* 127*   Coagulation Profile: No results for input(s): INR, PROTIME in the last 168 hours. Cardiac Enzymes: No results for input(s): CKTOTAL, CKMB, CKMBINDEX, TROPONINI in the last 168 hours. BNP: Invalid input(s): POCBNP CBG:  Recent Labs Lab 01/04/17 0411  GLUCAP 297*   Urine analysis:    Component Value Date/Time   COLORURINE AMBER (A) 01/04/2017 0256   APPEARANCEUR HAZY (A) 01/04/2017 0256   LABSPEC 1.029 01/04/2017 0256   PHURINE 5.0 01/04/2017 0256   GLUCOSEU >=500 (A) 01/04/2017 0256   HGBUR SMALL (A) 01/04/2017 0256   BILIRUBINUR NEGATIVE 01/04/2017  [place] 01/04/2017 0256   PROTEINUR 100 (A) 01/04/2017 0256   NITRITE NEGATIVE 01/04/2017 0256   LEUKOCYTESUR SMALL (A) 01/04/2017 0256   Sepsis Labs: @LABRCNTIP (procalcitonin:4,lacticidven:4) ) Recent Results (from the past 240 hour(s))  Urine culture     Status: Abnormal   Collection Time: 01/04/17  5:18 AM  Result Value Ref Range Status   Specimen Description URINE, CLEAN CATCH  Final   Special Requests NONE  Final   Culture MULTIPLE SPECIES PRESENT, SUGGEST RECOLLECTION (A)  Final   Report Status 01/05/2017 FINAL  Final     Radiological Exams on Admission: Ct Head Wo Contrast  Result Date: 01/09/2017 CLINICAL DATA:  Altered mental status and headache EXAM: CT HEAD WITHOUT CONTRAST TECHNIQUE: Contiguous axial images  were obtained from the base of the skull through the vertex without intravenous contrast. COMPARISON:  Head CT 01/04/2017 FINDINGS: Brain: There is a new focal area of hypoattenuation at the cortex of the right parietal lobe with loss of gray-white differentiation, consistent with acute infarct. No midline shift or mass effect. No extra-axial collection. No hemorrhage. There is periventricular hypoattenuation compatible with chronic microvascular disease. There is bilateral basal ganglia mineralization. Vascular: Atherosclerotic calcification of the vertebral and internal carotid arteries at the skull base. Skull: Normal visualized skull base, calvarium and extracranial soft tissues. Sinuses/Orbits: No sinus fluid levels or advanced mucosal thickening. No mastoid effusion. Normal orbits. IMPRESSION: 1. Subacute infarct of the posterosuperior right parietal lobe with expected evolution since the CT of 01/04/2017. No hemorrhage or mass effect. MRI could be obtained for further characterization. 2. Chronic microvascular ischemia. These results were called by telephone at the time of interpretation on 01/09/2017 at 2:34 pm to Dr. Quintella Reichert , who verbally acknowledged these results. Electronically Signed   By: Ulyses Jarred M.D.   On: 01/09/2017 14:37    EKG: Independently reviewed. Sinus rhythm, nonspecific T-wave changes    Time spent:70 minutes Code Status:   FULL Family Communication:  Daughters updated at bedside Disposition Plan: expect 2-3 day hospitalization Consults called: Neurology DVT Prophylaxis: [place] Lovenox  Koleman Marling, DO  [facility] Pager [phone]  If 7PM-7AM, please contact night-coverage [email or web address] Password [email or web address] 01/09/2017, 5:50 PM

## 2017-01-09 NOTE — ED Triage Notes (Signed)
Per [facility]- Pt resides at home. Pt seen and treated here at [facility] DX UTI this Saturday. Keflex given. Altered mental status last night. Confusion and generalized weakness-not her baseline. Pt presents to EMS warm and clammy. Daughter states pt has not been eating or drinking well. Denies N/V/D. Daughter states biopsy in the past several weeks R/o cancer.

## 2017-01-09 NOTE — ED Notes (Signed)
Contacted pharmacy for booklet

## 2017-01-09 NOTE — Progress Notes (Addendum)
Patient arrived via [facility], vitals stable. Patient only oriented to self with sternal rub, will not follow any other commands at this time. Family at bedside.  Will continue to monitor.   Patient taken to MRI, tele to be applied when patient returns.

## 2017-01-09 NOTE — ED Provider Notes (Signed)
[facility] DEPT Provider Note   CSN: [id number] Arrival date & time: 01/09/17  1312     History   Chief Complaint Chief Complaint  Patient presents with  . Altered Mental Status  . Urinary Tract Infection    HPI MONISA HEISKELL is a 76 y.o. female.  The history is provided by a relative. No language interpreter was used.  Altered Mental Status    Urinary Tract Infection     PHINLEY SCHRACK is a 76 y.o. female who presents to the Emergency Department complaining of AMS.  Level V caveat due to AMS. Hx is provided by the patient's daughters.  A week and a half ago she had a liver biopsy performed. Over the last week she's had progressive worsening of her mental status. She was seen in the emergency department 4 days ago and was started on Keflex for urinary tract infection. She also reports worsening headaches. Family presents today because she has had increased confusion and somnolence. No fevers, vomiting, diarrhea.  Past Medical History:  Diagnosis Date  . Arthritis   . Colon cancer ([place])   . Diabetes mellitus   . Hyperlipidemia   . Hypertension   . UTI (urinary tract infection)     Patient Active Problem List   Diagnosis Date Noted  . Acute encephalopathy 01/09/2017  . Hypertension 02/21/2012  . Diabetes mellitus 02/21/2012  . Colon cancer ([place]) 02/21/2012    Past Surgical History:  Procedure Laterality Date  . ABDOMINAL HYSTERECTOMY    . APPENDECTOMY    . CATARACT EXTRACTION    . COLON RESECTION    . COLONOSCOPY    . FLEXIBLE SIGMOIDOSCOPY N/A 10/10/2015   Procedure: FLEXIBLE SIGMOIDOSCOPY;  Surgeon: Garlan Fair, MD;  Location: [facility];  Service: Endoscopy;  Laterality: N/A;  UNSEDATED  . LEG SURGERY      OB History    No data available       Home Medications    Prior to Admission medications   Medication Sig Start Date End Date Taking? Authorizing Provider  cephALEXin (KEFLEX) 500 MG capsule Take 1 capsule (500 mg total) by mouth 2  (two) times daily. 01/04/17  Yes Kristen N Ward, DO  glipiZIDE (GLUCOTROL) 10 MG tablet Take 10 mg by mouth daily.   Yes Historical Provider, MD  Insulin Degludec (TRESIBA FLEXTOUCH) 200 UNIT/ML SOPN Inject 20 Units into the muscle every evening. After dinner 11/07/16  Yes Historical Provider, MD  linagliptin (TRADJENTA) 5 MG TABS tablet Take 5 mg by mouth daily.   Yes Historical Provider, MD  losartan (COZAAR) 50 MG tablet Take 50 mg by mouth daily.   Yes Historical Provider, MD  metFORMIN (GLUCOPHAGE) 1000 MG tablet Take 1,000 mg by mouth daily with breakfast.   Yes Historical Provider, MD  traMADol (ULTRAM) 50 MG tablet Take 50 mg by mouth every 6 (six) hours as needed for moderate pain.   Yes Historical Provider, MD    Family History History reviewed. No pertinent family history.  Social History Social History  Substance Use Topics  . Smoking status: Never Smoker  . Smokeless tobacco: Never Used  . Alcohol use No     Allergies   Patient has no known allergies.   Review of Systems Review of Systems  Unable to perform ROS: Mental status change     Physical Exam Updated Vital Signs BP 121/56 (BP Location: Right Arm)   Pulse (!) 55   Temp 98.3 F (36.8 C) (Oral)   Resp  17   Ht 5\' 2"  (1.575 m)   Wt 140 lb (63.5 kg)   SpO2 96%   BMI 25.61 kg/m   Physical Exam  Constitutional: She appears well-developed and well-nourished.  HENT:  Head: Normocephalic and atraumatic.  Eyes: EOM are normal. Pupils are equal, round, and reactive to light.  Cardiovascular: Normal rate and regular rhythm.   No murmur heard. Pulmonary/Chest: Effort normal and breath sounds normal. No respiratory distress.  Abdominal: Soft. There is no tenderness. There is no rebound and no guarding.  Musculoskeletal: She exhibits no edema or tenderness.  Neurological:  Disoriented to place and time. Drowsy but conversant. Prefers to gaze to the left but can cross midline. Confused. Generalized weakness.    Skin: Skin is warm and dry. Capillary refill takes less than 2 seconds.  Psychiatric:  Unable to assess  Nursing note and vitals reviewed.    ED Treatments / Results  Labs (all labs ordered are listed, but only abnormal results are displayed) Labs Reviewed  COMPREHENSIVE METABOLIC PANEL - Abnormal; Notable for the following:       Result Value   Glucose, Bld 120 (*)    Total Protein 6.2 (*)    Albumin 3.3 (*)    AST 87 (*)    ALT 118 (*)    Alkaline Phosphatase 337 (*)    Total Bilirubin 1.3 (*)    All other components within normal limits  CBC WITH DIFFERENTIAL/PLATELET - Abnormal; Notable for the following:    RBC 3.73 (*)    Hemoglobin 10.6 (*)    HCT 31.2 (*)    Platelets 127 (*)    All other components within normal limits  I-STAT TROPOININ, ED - Abnormal; Notable for the following:    Troponin i, poc 0.85 (*)    All other components within normal limits  AMMONIA  I-STAT CG4 LACTIC ACID, ED  I-STAT CG4 LACTIC ACID, ED    EKG  EKG Interpretation None       Radiology Ct Head Wo Contrast  Result Date: 01/09/2017 CLINICAL DATA:  Altered mental status and headache EXAM: CT HEAD WITHOUT CONTRAST TECHNIQUE: Contiguous axial images were obtained from the base of the skull through the vertex without intravenous contrast. COMPARISON:  Head CT 01/04/2017 FINDINGS: Brain: There is a new focal area of hypoattenuation at the cortex of the right parietal lobe with loss of gray-white differentiation, consistent with acute infarct. No midline shift or mass effect. No extra-axial collection. No hemorrhage. There is periventricular hypoattenuation compatible with chronic microvascular disease. There is bilateral basal ganglia mineralization. Vascular: Atherosclerotic calcification of the vertebral and internal carotid arteries at the skull base. Skull: Normal visualized skull base, calvarium and extracranial soft tissues. Sinuses/Orbits: No sinus fluid levels or advanced mucosal  thickening. No mastoid effusion. Normal orbits. IMPRESSION: 1. Subacute infarct of the posterosuperior right parietal lobe with expected evolution since the CT of 01/04/2017. No hemorrhage or mass effect. MRI could be obtained for further characterization. 2. Chronic microvascular ischemia. These results were called by telephone at the time of interpretation on 01/09/2017 at 2:34 pm to Dr. Quintella Reichert , who verbally acknowledged these results. Electronically Signed   By: Ulyses Jarred M.D.   On: 01/09/2017 14:37    Procedures Procedures (including critical care time)  Medications Ordered in ED Medications - No data to display   Initial Impression / Assessment and Plan / ED Course  I have reviewed the triage vital signs and the nursing notes.  Pertinent labs &  imaging results that were available during my care of the patient were reviewed by me and considered in my medical decision making (see chart for details).     Pt here for progressively worsening mental status for last 1.5 wks, much worse this morning after taking tramadol.  CT head demonstrates evolving infarct.  D/w family findings of studies and recommendation for admission for further evaluation.  D/w Neurologist on call - recommends transfer to [facility] for further work up for CVA.  Hospitalist consulted for admission for further work up.   Pt is not a TPA candidate given duration of symptoms, metatstatic cancer with recent biopsy.    Final Clinical Impressions(s) / ED Diagnoses   Final diagnoses:  Dizziness  Nonintractable headache, unspecified chronicity pattern, unspecified headache type    New Prescriptions New Prescriptions   No medications on file     Quintella Reichert, MD 01/10/17 [phone]

## 2017-01-09 NOTE — ED Notes (Signed)
BLOOD CULTURE X 1 5ML/EACH FROM RT HAND

## 2017-01-09 NOTE — ED Notes (Signed)
Pt turned to place on bedpan, vomited bile 125 cc. Pt remains nauseated and with active dry heaves. Admitting MD paged for change in status

## 2017-01-10 ENCOUNTER — Inpatient Hospital Stay (HOSPITAL_COMMUNITY): Payer: Medicare Other

## 2017-01-10 ENCOUNTER — Inpatient Hospital Stay (HOSPITAL_COMMUNITY): Payer: Medicare Other | Admitting: Anesthesiology

## 2017-01-10 ENCOUNTER — Encounter (HOSPITAL_COMMUNITY)
Admission: EM | Disposition: A | Discharge status: Home Health | Payer: Self-pay | Source: Home / Self Care | Attending: Neurology

## 2017-01-10 DIAGNOSIS — I639 Cerebral infarction, unspecified: Secondary | ICD-10-CM

## 2017-01-10 DIAGNOSIS — I6340 Cerebral infarction due to embolism of unspecified cerebral artery: Secondary | ICD-10-CM | POA: Insufficient documentation

## 2017-01-10 DIAGNOSIS — I82431 Acute embolism and thrombosis of right popliteal vein: Secondary | ICD-10-CM

## 2017-01-10 DIAGNOSIS — I6312 Cerebral infarction due to embolism of basilar artery: Secondary | ICD-10-CM

## 2017-01-10 DIAGNOSIS — E119 Type 2 diabetes mellitus without complications: Secondary | ICD-10-CM | POA: Diagnosis not present

## 2017-01-10 DIAGNOSIS — I10 Essential (primary) hypertension: Secondary | ICD-10-CM | POA: Diagnosis not present

## 2017-01-10 DIAGNOSIS — G9340 Encephalopathy, unspecified: Secondary | ICD-10-CM

## 2017-01-10 DIAGNOSIS — I6789 Other cerebrovascular disease: Secondary | ICD-10-CM

## 2017-01-10 DIAGNOSIS — C799 Secondary malignant neoplasm of unspecified site: Secondary | ICD-10-CM

## 2017-01-10 DIAGNOSIS — C189 Malignant neoplasm of colon, unspecified: Secondary | ICD-10-CM

## 2017-01-10 HISTORY — PX: IR GENERIC HISTORICAL: IMG1180011

## 2017-01-10 HISTORY — PX: RADIOLOGY WITH ANESTHESIA: SHX6223

## 2017-01-10 LAB — LIPID PANEL
CHOLESTEROL: 194 mg/dL (ref 0–200)
HDL: 67 mg/dL (ref >40)
LDL CALC: 110 mg/dL — AB (ref 0–99)
Total CHOL/HDL Ratio: 2.9 RATIO
Triglycerides: 86 mg/dL (ref <150)
VLDL: 17 mg/dL (ref 0–40)

## 2017-01-10 LAB — BLOOD GAS, ARTERIAL
ACID-BASE DEFICIT: 2.7 mmol/L — AB (ref 0.0–2.0)
BICARBONATE: 22 mmol/L (ref 20.0–28.0)
Drawn by: 441351
FIO2: 40
LHR: 18 {breaths}/min
O2 Saturation: 98.8 %
PATIENT TEMPERATURE: 98.6
PEEP/CPAP: 5 cmH2O
VT: 400 mL
pCO2 arterial: 41.1 mmHg (ref 32.0–48.0)
pH, Arterial: 7.348 — ABNORMAL LOW (ref 7.350–7.450)
pO2, Arterial: 157 mmHg — ABNORMAL HIGH (ref 83.0–108.0)

## 2017-01-10 LAB — HEPATIC FUNCTION PANEL
ALK PHOS: 341 U/L — AB (ref 38–126)
ALT: 117 U/L — AB (ref 14–54)
AST: 79 U/L — AB (ref 15–41)
Albumin: 3.4 g/dL — ABNORMAL LOW (ref 3.5–5.0)
BILIRUBIN INDIRECT: 0.8 mg/dL (ref 0.3–0.9)
Bilirubin, Direct: 0.3 mg/dL (ref 0.1–0.5)
TOTAL PROTEIN: 6.5 g/dL (ref 6.5–8.1)
Total Bilirubin: 1.1 mg/dL (ref 0.3–1.2)

## 2017-01-10 LAB — ECHOCARDIOGRAM COMPLETE
HEIGHTINCHES: 62 in
Weight: 2141.11 oz

## 2017-01-10 LAB — CBC WITH DIFFERENTIAL/PLATELET
BASOS PCT: 0 %
Basophils Absolute: 0 10*3/uL (ref 0.0–0.1)
EOS PCT: 0 %
Eosinophils Absolute: 0 10*3/uL (ref 0.0–0.7)
HEMATOCRIT: 31.8 % — AB (ref 36.0–46.0)
Hemoglobin: 10.6 g/dL — ABNORMAL LOW (ref 12.0–15.0)
LYMPHS PCT: 17 %
Lymphs Abs: 1.2 10*3/uL (ref 0.7–4.0)
MCH: 28.1 pg (ref 26.0–34.0)
MCHC: 33.3 g/dL (ref 30.0–36.0)
MCV: 84.4 fL (ref 78.0–100.0)
MONO ABS: 0.4 10*3/uL (ref 0.1–1.0)
MONOS PCT: 6 %
NEUTROS ABS: 5.5 10*3/uL (ref 1.7–7.7)
Neutrophils Relative %: 77 %
Platelets: 152 10*3/uL (ref 150–400)
RBC: 3.77 MIL/uL — ABNORMAL LOW (ref 3.87–5.11)
RDW: 13.7 % (ref 11.5–15.5)
WBC: 7.2 10*3/uL (ref 4.0–10.5)

## 2017-01-10 LAB — GLUCOSE, CAPILLARY
GLUCOSE-CAPILLARY: 163 mg/dL — AB (ref 65–99)
Glucose-Capillary: 120 mg/dL — ABNORMAL HIGH (ref 65–99)
Glucose-Capillary: 199 mg/dL — ABNORMAL HIGH (ref 65–99)
Glucose-Capillary: 201 mg/dL — ABNORMAL HIGH (ref 65–99)
Glucose-Capillary: 173 mg/dL — ABNORMAL HIGH (ref 65–99)
Glucose-Capillary: 169 mg/dL — ABNORMAL HIGH (ref 65–99)

## 2017-01-10 LAB — TROPONIN I
TROPONIN I: 0.91 ng/mL — AB (ref <0.03)
TROPONIN I: 0.92 ng/mL — AB (ref <0.03)
Troponin I: 1.24 ng/mL (ref <0.03)

## 2017-01-10 LAB — APTT: aPTT: 27 seconds (ref 24–36)

## 2017-01-10 LAB — BASIC METABOLIC PANEL
Anion gap: 14 (ref 5–15)
BUN: 9 mg/dL (ref 6–20)
CALCIUM: 10 mg/dL (ref 8.9–10.3)
CO2: 24 mmol/L (ref 22–32)
CREATININE: 0.86 mg/dL (ref 0.44–1.00)
Chloride: 105 mmol/L (ref 101–111)
GFR calc Af Amer: >60 mL/min (ref >60)
GFR calc non Af Amer: >60 mL/min (ref >60)
GLUCOSE: 154 mg/dL — AB (ref 65–99)
Potassium: 3.6 mmol/L (ref 3.5–5.1)
Sodium: 143 mmol/L (ref 135–145)

## 2017-01-10 LAB — MRSA PCR SCREENING: MRSA by PCR: NEGATIVE

## 2017-01-10 LAB — PROTIME-INR
INR: 1.28
Prothrombin Time: 16.1 seconds — ABNORMAL HIGH (ref 11.4–15.2)

## 2017-01-10 LAB — TRIGLYCERIDES: Triglycerides: 102 mg/dL (ref <150)

## 2017-01-10 LAB — HEPARIN LEVEL (UNFRACTIONATED): Heparin Unfractionated: <0.1 IU/mL — ABNORMAL LOW (ref 0.30–0.70)

## 2017-01-10 SURGERY — RADIOLOGY WITH ANESTHESIA
Anesthesia: General

## 2017-01-10 MED ORDER — MIDAZOLAM HCL 2 MG/2ML IJ SOLN
INTRAMUSCULAR | Status: DC | PRN
  Administered 2017-01-10: 2 mg via INTRAVENOUS

## 2017-01-10 MED ORDER — ROCURONIUM BROMIDE 100 MG/10ML IV SOLN
INTRAVENOUS | Status: DC | PRN
  Administered 2017-01-10 (×2): 50 mg via INTRAVENOUS

## 2017-01-10 MED ORDER — EPHEDRINE SULFATE 50 MG/ML IJ SOLN
INTRAMUSCULAR | Status: DC | PRN
  Administered 2017-01-10: 5 mg via INTRAVENOUS

## 2017-01-10 MED ORDER — FENTANYL CITRATE (PF) 100 MCG/2ML IJ SOLN
50.0000 ug | INTRAMUSCULAR | Status: DC | PRN
  Filled 2017-01-10: qty 2

## 2017-01-10 MED ORDER — SUFENTANIL CITRATE 50 MCG/ML IV SOLN
INTRAVENOUS | Status: DC | PRN
  Administered 2017-01-10 (×4): 10 ug via INTRAVENOUS

## 2017-01-10 MED ORDER — LACTATED RINGERS IV SOLN: INTRAVENOUS | Status: DC | PRN

## 2017-01-10 MED ORDER — SODIUM CHLORIDE 0.9 % IJ SOLN
INTRAMUSCULAR | Status: AC
  Filled 2017-01-10: qty 10

## 2017-01-10 MED ORDER — ONDANSETRON HCL 4 MG/2ML IJ SOLN: 4.0000 mg | Freq: Four times a day (QID) | INTRAMUSCULAR | Status: DC | PRN

## 2017-01-10 MED ORDER — HEPARIN SODIUM (PORCINE) 1000 UNIT/ML IJ SOLN
INTRAMUSCULAR | Status: DC | PRN
  Administered 2017-01-10: 1000 [IU] via INTRAVENOUS

## 2017-01-10 MED ORDER — SUCCINYLCHOLINE CHLORIDE 20 MG/ML IJ SOLN
INTRAMUSCULAR | Status: DC | PRN
  Administered 2017-01-10: 120 mg via INTRAVENOUS

## 2017-01-10 MED ORDER — CEFAZOLIN SODIUM-DEXTROSE 2-3 GM-% IV SOLR
INTRAVENOUS | Status: DC | PRN
  Administered 2017-01-10: 2 g via INTRAVENOUS

## 2017-01-10 MED ORDER — SODIUM CHLORIDE 0.9 % IV SOLN
INTRAVENOUS | Status: DC
  Administered 2017-01-10: 05:00:00 via INTRAVENOUS

## 2017-01-10 MED ORDER — CEFAZOLIN SODIUM-DEXTROSE 2-4 GM/100ML-% IV SOLN
INTRAVENOUS | Status: AC
  Filled 2017-01-10: qty 100

## 2017-01-10 MED ORDER — ACETAMINOPHEN 325 MG PO TABS: 650.0000 mg | ORAL_TABLET | ORAL | Status: DC | PRN

## 2017-01-10 MED ORDER — ACETAMINOPHEN 650 MG RE SUPP
650.0000 mg | RECTAL | Status: DC | PRN
  Administered 2017-01-26: 650 mg via RECTAL
  Filled 2017-01-10: qty 1

## 2017-01-10 MED ORDER — SUFENTANIL CITRATE 50 MCG/ML IV SOLN
INTRAVENOUS | Status: AC
  Filled 2017-01-10: qty 1

## 2017-01-10 MED ORDER — EPTIFIBATIDE 20 MG/10ML IV SOLN
INTRAVENOUS | Status: AC
  Administered 2017-01-10 (×6): 1.8 mg
  Filled 2017-01-10: qty 10

## 2017-01-10 MED ORDER — PHENYLEPHRINE HCL 10 MG/ML IJ SOLN
INTRAVENOUS | Status: DC | PRN
  Administered 2017-01-10: 15 ug/min via INTRAVENOUS

## 2017-01-10 MED ORDER — ORAL CARE MOUTH RINSE
15.0000 mL | OROMUCOSAL | Status: DC
  Administered 2017-01-10 – 2017-01-19 (×85): 15 mL via OROMUCOSAL

## 2017-01-10 MED ORDER — PANTOPRAZOLE SODIUM 40 MG IV SOLR
40.0000 mg | Freq: Every day | INTRAVENOUS | Status: DC
  Administered 2017-01-10 – 2017-01-17 (×8): 40 mg via INTRAVENOUS
  Filled 2017-01-10 (×8): qty 40

## 2017-01-10 MED ORDER — CHLORHEXIDINE GLUCONATE 0.12% ORAL RINSE (MEDLINE KIT)
15.0000 mL | Freq: Two times a day (BID) | OROMUCOSAL | Status: DC
  Administered 2017-01-10 (×2): 15 mL via OROMUCOSAL

## 2017-01-10 MED ORDER — ACETAMINOPHEN 160 MG/5ML PO SOLN
650.0000 mg | ORAL | Status: DC | PRN
  Administered 2017-01-11 – 2017-01-31 (×14): 650 mg
  Filled 2017-01-10 (×14): qty 20.3

## 2017-01-10 MED ORDER — LIDOCAINE HCL (CARDIAC) 20 MG/ML IV SOLN
INTRAVENOUS | Status: DC | PRN
  Administered 2017-01-10: 25 mg via INTRATRACHEAL

## 2017-01-10 MED ORDER — MIDAZOLAM HCL 2 MG/2ML IJ SOLN
INTRAMUSCULAR | Status: AC
  Filled 2017-01-10: qty 2

## 2017-01-10 MED ORDER — NITROGLYCERIN 1 MG/10 ML FOR IR/CATH LAB
INTRA_ARTERIAL | Status: AC
  Filled 2017-01-10: qty 10

## 2017-01-10 MED ORDER — CLEVIDIPINE BUTYRATE 0.5 MG/ML IV EMUL
0.0000 mg/h | INTRAVENOUS | Status: DC
  Administered 2017-01-10 (×2): 3 mg/h via INTRAVENOUS
  Administered 2017-01-10 – 2017-01-11 (×2): 2 mg/h via INTRAVENOUS
  Administered 2017-01-11 (×2): 4 mg/h via INTRAVENOUS
  Administered 2017-01-12 (×2): 20 mg/h via INTRAVENOUS
  Administered 2017-01-12: 2 mg/h via INTRAVENOUS
  Administered 2017-01-12 – 2017-01-13 (×4): 4 mg/h via INTRAVENOUS
  Administered 2017-01-13: 3 mg/h via INTRAVENOUS
  Filled 2017-01-10 (×14): qty 50

## 2017-01-10 MED ORDER — PROPOFOL 10 MG/ML IV BOLUS
INTRAVENOUS | Status: DC | PRN
  Administered 2017-01-10: 120 mg via INTRAVENOUS

## 2017-01-10 MED ORDER — CHLORHEXIDINE GLUCONATE 0.12% ORAL RINSE (MEDLINE KIT)
15.0000 mL | Freq: Two times a day (BID) | OROMUCOSAL | Status: DC
  Administered 2017-01-11 – 2017-01-19 (×17): 15 mL via OROMUCOSAL

## 2017-01-10 MED ORDER — PROPOFOL 1000 MG/100ML IV EMUL
0.0000 ug/kg/min | INTRAVENOUS | Status: DC
  Administered 2017-01-10: 5 ug/kg/min via INTRAVENOUS
  Filled 2017-01-10: qty 100

## 2017-01-10 MED ORDER — SODIUM CHLORIDE 0.9 % IV SOLN
INTRAVENOUS | Status: DC | PRN
  Administered 2017-01-10 (×2): via INTRAVENOUS

## 2017-01-10 MED ORDER — IOPAMIDOL (ISOVUE-300) INJECTION 61%
INTRAVENOUS | Status: AC
  Administered 2017-01-10: 50 mL
  Filled 2017-01-10: qty 150

## 2017-01-10 MED ORDER — FENTANYL CITRATE (PF) 100 MCG/2ML IJ SOLN: 50.0000 ug | INTRAMUSCULAR | Status: DC | PRN

## 2017-01-10 MED ORDER — ORAL CARE MOUTH RINSE
15.0000 mL | Freq: Four times a day (QID) | OROMUCOSAL | Status: DC
  Administered 2017-01-10 (×2): 15 mL via OROMUCOSAL

## 2017-01-10 NOTE — Progress Notes (Signed)
CRITICAL VALUE ALERT  Critical value received:  Troponin 1.24  Date of notification:  01/10/2017  Time of notification:  0624  Critical value read back: yes  Nurse who received alert:  Chesley Noon, RN  MD notified (1st page):  CCM via [place]  Time of first page:  0627  MD notified (2nd page):  Time of second page:  Responding MD:  CCMD via [place]  Time MD responded:  0627 - at time of notification  MD stated this lab result is likely due to the pt's current situation - no new interventions/orders were given. Will continue to monitor troponins as they are ordered every 6 hours from the first blood draw.  Guadalupe Maple, RN

## 2017-01-10 NOTE — Progress Notes (Signed)
RT met IR in CT 3 and hooked patient up to ventilator, then transported patient from CT to [id number] with no complications. No respiratory distress noted at this time. RT will continue to monitor as needed.

## 2017-01-10 NOTE — Progress Notes (Signed)
**  Preliminary report by tech**  Carotid artery duplex complete. Findings are consistent with a 1-39 percent stenosis involving the right internal carotid artery and the left internal carotid artery. The vertebral arteries demonstrate antegrade flow.  Bilateral lower extremity venous duplex complete. There is evidence of acute deep vein thrombosis involving the peroneal veins of the right lower extremity.  There is no evidence of superficial vein thrombosis involving the right lower extremity. There is no evidence of deep or superficial vein thrombosis involving the left lower extremity. There is no evidence of a Baker's cyst bilaterally. Results were given to the patient's student nurse, Kia, who will give the results to the RN, Katie.  01/10/17 3:56 PM April Franklin RVT

## 2017-01-10 NOTE — Plan of Care (Signed)
Discussed with patient and husband and daughter at bedside. Delivered MRI and LE venous Doppler result to them. Patient had a embolic stroke and right LE DVT, likely due to hypercoagulable state from metastasic colon cancer. Currently there is a treatment dilemma. For DVT treatment anticoagulation required to prevent PE, however patient had large infarct in the right cerebellum and the right MCA which causing high risk of hemorrhagic transformation with and equation at this time. Patient's family was given choices about no aggressive care with anticoagulation given full prognosis versus IVC filter. Family would like to have more discussion among themselves.  He also discuss about further plan of the treatment management. Patient has no living well or advanced directives. Given extensive stroke, DVT, and metastasic cancer, family will also discuss among themselves about comfort care versus no escalation of care versus aggressive here towords trach and PEG and nursing home.  They will let us know once they made a decision and will act accordingly.  Rosalin Hawking, MD PhD Stroke Neurology 01/10/2017 6:00 PM

## 2017-01-10 NOTE — Transfer of Care (Signed)
Immediate Anesthesia Transfer of Care Note  Patient: April Franklin  Procedure(s) Performed: Procedure(s): RADIOLOGY WITH ANESTHESIA (N/A)  Patient Location: SICU  Anesthesia Type:General  Level of Consciousness: Patient remains intubated per anesthesia plan  Airway & Oxygen Therapy: Patient remains intubated per anesthesia plan and Patient placed on Ventilator (see vital sign flow sheet for setting)  Post-op Assessment: Report given to RN and Post -op Vital signs reviewed and stable  Post vital signs: Reviewed and stable  Last Vitals:  Vitals:   01/10/17 0039 01/10/17 0433  BP: (!) 143/56 (!) 133/59  Pulse: 65 66  Resp: 16 19  Temp: 37.1 C     Last Pain:  Vitals:   01/10/17 0039  TempSrc: Axillary  PainSc:          Complications: No apparent anesthesia complications

## 2017-01-10 NOTE — Anesthesia Procedure Notes (Signed)
Procedure Name: Intubation Date/Time: 01/10/2017 1:27 AM Performed by: Rinoa Garramone S Pre-anesthesia Checklist: Patient identified, Emergency Drugs available, Suction available, Patient being monitored and Timeout performed Patient Re-evaluated:Patient Re-evaluated prior to inductionOxygen Delivery Method: Circle system utilized Preoxygenation: Pre-oxygenation with 100% oxygen Intubation Type: IV induction, Rapid sequence and Cricoid Pressure applied Laryngoscope Size: Mac and 3 Grade View: Grade I Tube type: Subglottic suction tube Tube size: 7.5 mm Number of attempts: 1 Airway Equipment and Method: Stylet Placement Confirmation: ETT inserted through vocal cords under direct vision,  positive ETCO2 and breath sounds checked- equal and bilateral Secured at: 22 cm Tube secured with: Tape Dental Injury: Teeth and Oropharynx as per pre-operative assessment

## 2017-01-10 NOTE — Progress Notes (Signed)
[place] Progress Note Patient Name: April Franklin DOB: 28-Mar-1941 MRN: [id number]   Date of Service  01/10/2017  HPI/Events of Note    Recent Labs Lab 01/09/17 1821 01/10/17 0526  TROPONINI 1.10* 1.24*     eICU Interventions  Check ekg Continue cycling ennzymes Get echo     Intervention Category Intermediate Interventions: Other:  Rafiq Bucklin 01/10/2017, 6:31 AM

## 2017-01-10 NOTE — Anesthesia Postprocedure Evaluation (Signed)
Anesthesia Post Note  Patient: April Franklin  Procedure(s) Performed: Procedure(s) (LRB): RADIOLOGY WITH ANESTHESIA (N/A)  Patient location during evaluation: NICU Level of consciousness: sedated and patient remains intubated per anesthesia plan Pain management: pain level controlled Vital Signs Assessment: post-procedure vital signs reviewed and stable Respiratory status: patient remains intubated per anesthesia plan and patient on ventilator - see flowsheet for VS Cardiovascular status: blood pressure returned to baseline Anesthetic complications: no       Last Vitals:  Vitals:   01/10/17 0733 01/10/17 0800  BP: 138/76 134/66  Pulse: 94 81  Resp: (!) 24 (!) 24  Temp:      Last Pain:  Vitals:   01/10/17 0430  TempSrc: Axillary  PainSc:                  April Franklin

## 2017-01-10 NOTE — Progress Notes (Signed)
BYRON FUESS [birth year] [id number]   Spoke with Dr. Leonel Ramsay who will take over pt's care at this point. 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  Patrici Ranks, NP-C [facility] [place]  pgr [phone]

## 2017-01-10 NOTE — Progress Notes (Signed)
  Echocardiogram 2D Echocardiogram has been performed.  Idolina Mantell L Androw 01/10/2017, 1:29 PM

## 2017-01-10 NOTE — Procedures (Signed)
S/P bilateral veretbral artery angiograms followed by endovascular complete revascularization of occluded basilar artery  X 1pass with trevoprovue 9mm x30 mm retrieval device ,and x 2 passes with solitaire FR 44mm x 40 mm devise achieving a TICI 3 reperfusion. Also used 10.4 mg of IA superselective Integrelin

## 2017-01-10 NOTE — Progress Notes (Signed)
PT Cancellation Note  Patient Details Name: April Franklin MRN: [id number] DOB: 03/26/1941   Cancelled Treatment:    Reason Eval/Treat Not Completed: Medical issues which prohibited therapy.  Still on bedrest from sheath removal.   Thanks,    Wells Guiles B. Cammeron Greis, PT, DPT [phone]   01/10/2017, 10:43 AM

## 2017-01-10 NOTE — Progress Notes (Signed)
Neuro MD talked with family bedside after MRI resulted, decision was made to take patient to IR. Second IV and foley placed per MD order. Rapid response RN and charge RN helped to prep patient; Rapid Response RN and primary RN took patient down to IR with family present in waiting area.

## 2017-01-10 NOTE — Progress Notes (Addendum)
Initial Nutrition Assessment  DOCUMENTATION CODES:   Severe malnutrition in context of acute illness/injury  INTERVENTION:   Pt could be at risk of refeeding syndrome due to very poor intake PTA.   If remains intubated recommend: Vital AF 1.2 start @ 20 ml/hr and increase to goal rate of 40 ml/hr in 12 hours 30 ml Prostat BID Provides: 1352 kcal, 102 grams protein, and 778 ml free water.    NUTRITION DIAGNOSIS:   Malnutrition (Severe) related to acute illness as evidenced by energy intake < or equal to 50% for > or equal to 5 days, severe depletion of muscle mass, 8 percent weight loss x 10 days.  GOAL:   Patient will meet greater than or equal to 90% of their needs  MONITOR:   I & O's, Vent status, Labs  REASON FOR ASSESSMENT:   Ventilator, Malnutrition Screening Tool    ASSESSMENT:   Pt withn PMH of DM, HTN, colon cancer s/p resection 2009. 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 She underwent IR intervention with revascularization and is now in ICU on vent.  Patient is currently intubated on ventilator support MV: 8.4 L/min Temp (24hrs), Avg:98.6 F (37 C), Min:97.4 F (36.3 C), Max:99.3 F (37.4 C)  Propofol: off Cleviprex: 6 ml/hr (288 kcal)  Daughter reports intake and weight stable until about 10 days ago. Pt lives with husband, who is not in the room currently. She reports that wife was doing the cooking at home until then but has since been somewhat confused and had some falls. She tried to get her mom to drink some ensure but pt did not like it. The family had been trying to get her to eat some crackers and applesauce but she would not eat much during this time frame.  She recently had a biopsy as noted above but per  daughter they do not know the results of the pathology.  Pt's usual weight is 145 lb which would indicate a 8% weight loss per daughter in the last 10 days. Suspect malnutrition is chronic but per daughter is more acute.   Diet Order:  Diet NPO time specified  Skin:  Reviewed, no issues  Last BM:  unknown  Height:   Ht Readings from Last 1 Encounters:  01/10/17 5\' 2"  (1.575 m)    Weight:   Wt Readings from Last 1 Encounters:  01/10/17 133 lb 13.1 oz (60.7 kg)    Ideal Body Weight:  50 kg  BMI:  Body mass index is 24.48 kg/m.  Estimated Nutritional Needs:   Kcal:  [id number]  Protein:  90-110 grams  Fluid:  > 1.5 L/day  EDUCATION NEEDS:   No education needs identified at this time  [place], [place], [place] Pager [phone] After Hours Pager

## 2017-01-10 NOTE — Progress Notes (Signed)
SLP Cancellation Note  Patient Details Name: GULIANNA PORTLEY MRN: [id number] DOB: [birth year]   Cancelled treatment:       Reason Eval/Treat Not Completed: Patient not medically ready   Siri Buege, Katherene Ponto 01/10/2017, 7:56 AM

## 2017-01-10 NOTE — Consult Note (Signed)
PULMONARY / CRITICAL CARE MEDICINE   Name: April Franklin MRN: [id number] DOB: 04-Apr-1941    ADMISSION DATE:  01/09/2017 CONSULTATION DATE:  01/10/2017  REFERRING MD:  Dr. Leonel Ramsay Neurology  CHIEF COMPLAINT:  CVA  HISTORY OF PRESENT ILLNESS:   76 year old female with PMH as below, which is significant for DM, colon cancer s/p resection, HTN, and HLD. The patient had a liver biopsy of hepatic masses that were noted on MRCP (12/09/2016). The biopsy was done on 12/30/2016. Since that time she has started to decline complaining of headaches, fatigue, and confusion which markedly worsened 2/13. She was admitted to [facility] where CT head demonstrated subacute infarct. MRI then demonstrated extensive areas of infarct including emergent basilar artery and R PCA occlusion. She was taken to IR and underwent revascularization. She returned to ICU on ventilator PCCM asked to see.   PAST MEDICAL HISTORY :  She  has a past medical history of Arthritis; Colon cancer ([place]); Diabetes mellitus; Hyperlipidemia; Hypertension; and UTI (urinary tract infection).  PAST SURGICAL HISTORY: She  has a past surgical history that includes Colon resection; Abdominal hysterectomy; Leg Surgery; Colonoscopy; Cataract extraction; Appendectomy; and Flexible sigmoidoscopy (N/A, 10/10/2015).  No Known Allergies  No current facility-administered medications on file prior to encounter.    Current Outpatient Prescriptions on File Prior to Encounter  Medication Sig  . cephALEXin (KEFLEX) 500 MG capsule Take 1 capsule (500 mg total) by mouth 2 (two) times daily.  Marland Kitchen glipiZIDE (GLUCOTROL) 10 MG tablet Take 10 mg by mouth daily.  . Insulin Degludec (TRESIBA FLEXTOUCH) 200 UNIT/ML SOPN Inject 20 Units into the muscle every evening. After dinner  . linagliptin (TRADJENTA) 5 MG TABS tablet Take 5 mg by mouth daily.  Marland Kitchen losartan (COZAAR) 50 MG tablet Take 50 mg by mouth daily.  . metFORMIN (GLUCOPHAGE) 1000 MG tablet Take 1,000  mg by mouth daily with breakfast.    FAMILY HISTORY:  Her has no family status information on file.    SOCIAL HISTORY: She  reports that she has never smoked. She has never used smokeless tobacco. She reports that she does not drink alcohol or use drugs.  REVIEW OF SYSTEMS:   unable  SUBJECTIVE:    VITAL SIGNS: BP (!) 133/59   Pulse 66   Temp 98.8 F (37.1 C) (Axillary)   Resp 19   Ht 5\' 2"  (1.575 m)   Wt 63 kg (138 lb 14.2 oz)   SpO2 100%   BMI 25.40 kg/m   HEMODYNAMICS:    VENTILATOR SETTINGS: Vent Mode: PRVC FiO2 (%):  [40 %] 40 % Set Rate:  [18 bmp] 18 bmp Vt Set:  [400 mL] 400 mL PEEP:  [5 cmH20] 5 cmH20 Plateau Pressure:  [14 cmH20] 14 cmH20  INTAKE / OUTPUT: I/O last 3 completed shifts: In: 1000 [I.V.:1000] Out: 325 [Emesis/NG output:325]  PHYSICAL EXAMINATION: General:  Elderly female in NAD on vent Neuro:  obtunded HEENT:  [place]/AT, PERRL, no jvd Cardiovascular:  RRR, no MRG Lungs:  Clear bilateral breath sounds Abdomen:  Soft, non-distended Musculoskeletal:  No acute deformity Skin:  Grossly intact  LABS:  BMET  Recent Labs Lab 01/04/17 0353 01/09/17 1356  NA 134* 137  K 5.1 3.5  CL 98* 104  CO2 22 25  BUN 15 15  CREATININE 0.90 0.83  GLUCOSE 328* 120*    Electrolytes  Recent Labs Lab 01/04/17 0353 01/09/17 1356  CALCIUM 10.3 9.7    CBC  Recent Labs Lab 01/04/17 0353  01/09/17 1356  WBC 5.1 5.9  HGB 10.4* 10.6*  HCT 30.7* 31.2*  PLT 146* 127*    Coag's  Recent Labs Lab 01/10/17 0103  APTT 27  INR 1.28    Sepsis Markers  Recent Labs Lab 01/09/17 1410 01/09/17 1646  LATICACIDVEN 0.96 1.29    ABG No results for input(s): PHART, PCO2ART, PO2ART in the last 168 hours.  Liver Enzymes  Recent Labs Lab 01/04/17 0353 01/09/17 1356 01/10/17 0104  AST 119* 87* 79*  ALT 152* 118* 117*  ALKPHOS 329* 337* 341*  BILITOT 1.6* 1.3* 1.1  ALBUMIN 3.5 3.3* 3.4*    Cardiac Enzymes  Recent Labs Lab  01/09/17 1821  TROPONINI 1.10*    Glucose  Recent Labs Lab 01/04/17 0411 01/09/17 2004 01/09/17 2355  GLUCAP 297* 144* 203*    Imaging Ct Head Wo Contrast  Result Date: 01/10/2017 CLINICAL DATA:  Follow-up stroke. Status post revascularization of occluded basilar artery. History of hypertension, diabetes, colon cancer. EXAM: CT HEAD WITHOUT CONTRAST TECHNIQUE: Contiguous axial images were obtained from the base of the skull through the vertex without intravenous contrast. COMPARISON:  CT HEAD and MRI head January 09, 2015 FINDINGS: BRAIN: Evolving large RIGHT cerebellar infarct with faint central density. Partially effaced fourth ventricle, increased from prior imaging. Small LEFT cerebellar infarcts. Evolving RIGHT parietal occipital lobe infarcts with faint density. Additional scattered small infarcts as seen on yesterday's MRI. No lobar hematoma. Ventricles and sulci are overall normal for patient's age. No abnormal extra-axial fluid collections. Basal cisterns are patent. VASCULAR: Moderate calcific atherosclerosis of the carotid siphons. SKULL: No skull fracture. No significant scalp soft tissue swelling. SINUSES/ORBITS: The mastoid air-cells and included paranasal sinuses are well-aerated.The included ocular globes and orbital contents are non-suspicious. OTHER: None. IMPRESSION: Evolving multifocal infarcts with worsening RIGHT cerebellar cytotoxic edema partially effacing the fourth ventricle. No obstructive hydrocephalus. Contrast staining and/or petechial hemorrhage RIGHT parieto-occipital lobe and RIGHT cerebellar infarcts without frank hemorrhagic conversion. Electronically Signed   By: Elon Alas M.D.   On: 01/10/2017 04:34   Ct Head Wo Contrast  Result Date: 01/09/2017 CLINICAL DATA:  Altered mental status and headache EXAM: CT HEAD WITHOUT CONTRAST TECHNIQUE: Contiguous axial images were obtained from the base of the skull through the vertex without intravenous contrast.  COMPARISON:  Head CT 01/04/2017 FINDINGS: Brain: There is a new focal area of hypoattenuation at the cortex of the right parietal lobe with loss of gray-white differentiation, consistent with acute infarct. No midline shift or mass effect. No extra-axial collection. No hemorrhage. There is periventricular hypoattenuation compatible with chronic microvascular disease. There is bilateral basal ganglia mineralization. Vascular: Atherosclerotic calcification of the vertebral and internal carotid arteries at the skull base. Skull: Normal visualized skull base, calvarium and extracranial soft tissues. Sinuses/Orbits: No sinus fluid levels or advanced mucosal thickening. No mastoid effusion. Normal orbits. IMPRESSION: 1. Subacute infarct of the posterosuperior right parietal lobe with expected evolution since the CT of 01/04/2017. No hemorrhage or mass effect. MRI could be obtained for further characterization. 2. Chronic microvascular ischemia. These results were called by telephone at the time of interpretation on 01/09/2017 at 2:34 pm to Dr. Quintella Reichert , who verbally acknowledged these results. Electronically Signed   By: Ulyses Jarred M.D.   On: 01/09/2017 14:37   Mr [name] Head Wo Contrast  Result Date: 01/10/2017 CLINICAL DATA:  Progressive altered mental status and confusion for several weeks. Headache. History of colon cancer, hypertension, diabetes. EXAM: MRI HEAD WITHOUT CONTRAST MRA HEAD WITHOUT  CONTRAST TECHNIQUE: Multiplanar, multiecho pulse sequences of the brain and surrounding structures were obtained without intravenous contrast. Angiographic images of the head were obtained using MRA technique without contrast. COMPARISON:  CT HEAD January 09, 2017 at 1419 hours FINDINGS: MRI HEAD FINDINGS BRAIN: Patchy reduced diffusion RIGHT greater than LEFT (multi vascular territory) cerebellum. Scattered subcentimeter foci of reduced diffusion midbrain, LEFT temporal lobe and RIGHT basal ganglia. Confluent RIGHT  greater than LEFT occipital lobe reduced diffusion and, RIGHT mesial temporal lobe. Patchy reduced diffusion predominately within white matter bilateral frontoparietal lobes. Low ADC values in T2 bright signal within all areas of reduced diffusion consistent with similar age. Punctate susceptibility artifact LEFT thalamus. Petechial hemorrhage RIGHT parietal and occipital lobes, to lesser extent RIGHT cerebellum. Ventricles and sulci are overall normal for patient's age. No midline shift. No masses. No abnormal extra-axial fluid collections. VASCULAR: T2 bright signal within the basilar artery consistent with thromboembolism. SKULL AND UPPER CERVICAL SPINE: No abnormal sellar expansion. No suspicious calvarial bone marrow signal. Craniocervical junction maintained. SINUSES/ORBITS: The mastoid air-cells and included paranasal sinuses are well-aerated. Status post RIGHT ocular lens implant. The included ocular globes and orbital contents are non-suspicious. OTHER: None. MRA HEAD FINDINGS ANTERIOR CIRCULATION: Normal flow related enhancement of the included cervical, petrous, cavernous and supraclinoid internal carotid arteries. Patent anterior communicating artery. Normal flow related enhancement of the anterior and middle cerebral arteries, including distal segments. No large vessel occlusion, high-grade stenosis, abnormal luminal irregularity, aneurysm. POSTERIOR CIRCULATION: RIGHT vertebral artery is dominant. Loss of basilar artery flow related enhancement. Reconstitution via LEFT posterior communicating artery. Occluded RIGHT posterior cerebral artery from the origin. No collaterals identified which may reflect time-of-flight technique. No high-grade stenosis, abnormal luminal irregularity, aneurysm. ANATOMIC VARIANTS: None. IMPRESSION: MRI HEAD: Acute extensive multi vascular territory supra and infratentorial infarcts. RIGHT petechial hemorrhage without hemorrhagic conversion. MRA HEAD: Emergent basilar artery  and RIGHT posterior cerebral artery occlusion. Reconstitution of basilar tip and LEFT posterior cerebral artery via LEFT posterior communicating artery. Acute findings discussed with and reconfirmed by Dr.MCNEILL [facility] on 01/09/2017 at 11:59 pm. Electronically Signed   By: Elon Alas M.D.   On: 01/10/2017 00:03   Mr Brain Wo Contrast  Result Date: 01/10/2017 CLINICAL DATA:  Progressive altered mental status and confusion for several weeks. Headache. History of colon cancer, hypertension, diabetes. EXAM: MRI HEAD WITHOUT CONTRAST MRA HEAD WITHOUT CONTRAST TECHNIQUE: Multiplanar, multiecho pulse sequences of the brain and surrounding structures were obtained without intravenous contrast. Angiographic images of the head were obtained using MRA technique without contrast. COMPARISON:  CT HEAD January 09, 2017 at 1419 hours FINDINGS: MRI HEAD FINDINGS BRAIN: Patchy reduced diffusion RIGHT greater than LEFT (multi vascular territory) cerebellum. Scattered subcentimeter foci of reduced diffusion midbrain, LEFT temporal lobe and RIGHT basal ganglia. Confluent RIGHT greater than LEFT occipital lobe reduced diffusion and, RIGHT mesial temporal lobe. Patchy reduced diffusion predominately within white matter bilateral frontoparietal lobes. Low ADC values in T2 bright signal within all areas of reduced diffusion consistent with similar age. Punctate susceptibility artifact LEFT thalamus. Petechial hemorrhage RIGHT parietal and occipital lobes, to lesser extent RIGHT cerebellum. Ventricles and sulci are overall normal for patient's age. No midline shift. No masses. No abnormal extra-axial fluid collections. VASCULAR: T2 bright signal within the basilar artery consistent with thromboembolism. SKULL AND UPPER CERVICAL SPINE: No abnormal sellar expansion. No suspicious calvarial bone marrow signal. Craniocervical junction maintained. SINUSES/ORBITS: The mastoid air-cells and included paranasal sinuses are  well-aerated. Status post RIGHT ocular lens implant. The included  ocular globes and orbital contents are non-suspicious. OTHER: None. MRA HEAD FINDINGS ANTERIOR CIRCULATION: Normal flow related enhancement of the included cervical, petrous, cavernous and supraclinoid internal carotid arteries. Patent anterior communicating artery. Normal flow related enhancement of the anterior and middle cerebral arteries, including distal segments. No large vessel occlusion, high-grade stenosis, abnormal luminal irregularity, aneurysm. POSTERIOR CIRCULATION: RIGHT vertebral artery is dominant. Loss of basilar artery flow related enhancement. Reconstitution via LEFT posterior communicating artery. Occluded RIGHT posterior cerebral artery from the origin. No collaterals identified which may reflect time-of-flight technique. No high-grade stenosis, abnormal luminal irregularity, aneurysm. ANATOMIC VARIANTS: None. IMPRESSION: MRI HEAD: Acute extensive multi vascular territory supra and infratentorial infarcts. RIGHT petechial hemorrhage without hemorrhagic conversion. 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 Reconstitution of basilar tip and LEFT posterior cerebral artery via LEFT posterior communicating artery. Acute findings discussed with and reconfirmed by Dr.MCNEILL [facility] on 01/09/2017 at 11:59 pm. Electronically Signed   By: Elon Alas M.D.   On: 01/10/2017 00:03     STUDIES:  Liver biopsy 2/5 > metastatic adenocarcinoma CT head 2/15> Subacute infarct of the posterosuperior right parietal lobe with expected evolution since the CT of 01/04/2017. No hemorrhage or mass effect. MRI could be obtained for further characterization. MRI/MRA brain 2/15 > Acute extensive multi vascular territory supra and infratentorial infarcts. RIGHT petechial hemorrhage without hemorrhagic conversion. 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 Reconstitution  of basilar tip and LEFT posterior cerebral artery via LEFT posterior communicating artery.  CULTURES:   ANTIBIOTICS:   SIGNIFICANT EVENTS:  LINES/TUBES: ETT 2/16 >  ASSESSMENT / PLAN:  PULMONARY A: Inability to protect airway s/p neuro IR procedure  P:   Full vent support ABG CXR for ETT placement VAP bundle  CARDIOVASCULAR A:  H/o HTN  P:  BP goal per neurology Holding home PO Cozaar PRN cleviprex  RENAL A:   No acute issues  P:   Follow BMP  GASTROINTESTINAL A:   Metastatic adenocarcinoma to liver  P:   NPO Protonix for SUP  HEMATOLOGIC A:   Anemia  P:  Follow CBC Anticoagulation per neurology  INFECTIOUS A:   No acute issues  P:     ENDOCRINE A:   DM    P:   Holding home glipizide and metformin CBG monitoring and SSI  NEUROLOGIC A:   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

## 2017-01-10 NOTE — Progress Notes (Addendum)
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 She has had progression between evaluation at [facility] and my evaluation. Though her last seen well was yesterday, with continued progression, and relative preservation of her brainstem on MRI, I think that intervention would be reasonable given the extremely high mortality in this situation if left untreated.   I discussed this with the family and they wish to proceed.   Interventional radiology has been notified.  Premorbid modified Rankin score: 0  Roland Rack, MD [facility] [phone]  If 7pm- 7am, please page neurology on call as listed in [place].

## 2017-01-10 NOTE — Progress Notes (Signed)
STROKE TEAM PROGRESS NOTE   HISTORY OF PRESENT ILLNESS (per record) April Franklin is a 76 y.o. female with a history of colon cancer who has been having progressive altered mental status and confusion over the past several weeks. It was only mild until 2 days ago at which point it started getting progressively worse, then this morning when she was checked on, she did not arouse appropriately and therefore she was brought into the [facility] emergency department. She also has been complaining of headaches over the past few weeks as well. On CT, she has a area of hypodensity in the right parietal region consistent with a subacute infarct. She also has been complaining of double vision. She was last known well several days ago. Patient was not administered IV t-PA secondary to being outside of the window. MRA revealed basilar occlusion. Given continued progression of neurologic worsening, intervention was considered reasonable. She was sent to interventional radiology where she had TICI3 revascularization of an occluded basilar artery with one pass of the trevo and 2 passes of the solitaire as well as intra-arterial Integrilin  She was admitted to the neuro ICU for further evaluation and treatment.   SUBJECTIVE (INTERVAL HISTORY) No family at bedside. EEG tech at bedside performing EEG. Patient is still intubated and on sedation. Not following commands or open eyes. Eyes deviated to the left. EEG showed no seizure preliminary.   OBJECTIVE Temp:  [97.4 F (36.3 C)-99.2 F (37.3 C)] 97.9 F (36.6 C) (02/16 0800) Pulse Rate:  [50-94] 87 (02/16 0915) Cardiac Rhythm: Normal sinus rhythm (02/16 0800) Resp:  [13-24] 21 (02/16 0915) BP: (107-186)/(52-93) 133/68 (02/16 0915) SpO2:  [93 %-100 %] 100 % (02/16 0915) FiO2 (%):  [40 %] 40 % (02/16 0900) Weight:  [60.7 kg (133 lb 13.1 oz)-63.5 kg (140 lb)] 60.7 kg (133 lb 13.1 oz) (02/16 0433)  CBC:  Recent Labs Lab 01/09/17 1356 01/10/17 0526  WBC 5.9 7.2   NEUTROABS 4.0 5.5  HGB 10.6* 10.6*  HCT 31.2* 31.8*  MCV 83.6 84.4  PLT 127* 0000000    Basic Metabolic Panel:  Recent Labs Lab 01/09/17 1356 01/10/17 0526  NA 137 143  K 3.5 3.6  CL 104 105  CO2 25 24  GLUCOSE 120* 154*  BUN 15 9  CREATININE 0.83 0.86  CALCIUM 9.7 10.0    Lipid Panel:    Component Value Date/Time   CHOL 194 01/10/2017 0104   TRIG 102 01/10/2017 0526   HDL 67 01/10/2017 0104   CHOLHDL 2.9 01/10/2017 0104   VLDL 17 01/10/2017 0104   LDLCALC 110 (H) 01/10/2017 0104   HgbA1c: No results found for: HGBA1C Urine Drug Screen: No results found for: LABOPIA, COCAINSCRNUR, LABBENZ, AMPHETMU, THCU, LABBARB    IMAGING I have personally reviewed the radiological images below and agree with the radiology interpretations.  Ct Head Wo Contrast 01/09/2017 1. Subacute infarct of the posterosuperior right parietal lobe with expected evolution since the CT of 01/04/2017. No hemorrhage or mass effect. MRI could be obtained for further characterization. 2. Chronic microvascular ischemia.   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 Reconstitution of basilar tip and LEFT posterior cerebral artery via LEFT posterior communicating artery.   Mr Brain Wo Contrast 01/10/2017 Acute extensive multi vascular territory supra and infratentorial infarcts. RIGHT petechial hemorrhage without hemorrhagic conversion.   Cerebral angiogram 01/10/2017 S/P bilateral veretbral artery angiograms followed by endovascular complete revascularization of occluded basilar artery X 1pass  with trevoprovue 14mm x30 mm retrieval device ,and x 2 passes with solitaire FR 45mm x 40 mm devise achieving a TICI 3 reperfusion. Also used 10.4 mg of IA superselective Integrelin  Ct Head Wo Contrast 01/10/2017 Evolving multifocal infarcts with worsening RIGHT cerebellar cytotoxic edema partially effacing the fourth ventricle. No obstructive  hydrocephalus. Contrast staining and/or petechial hemorrhage RIGHT parieto-occipital lobe and RIGHT cerebellar infarcts without frank hemorrhagic conversion.   EEG -  This sedated EEG is abnormal due to the presence of: 1. Moderate diffuse slowing of the background 2. Additional focal slowing over the left centrotemporal region Clinical Correlation of the above findings indicates diffuse cerebral dysfunction that is non-specific in etiology and can be seen with hypoxic/ischemic injury, toxic/metabolic encephalopathies, or medication effect from Propofol. Additional focal slowing over the left centrotemporal region indicates focal cerebral dysfunction in this region suggestive of underlying structural or physiologic abnormality. There were no electrographic seizures in this study.  The absence of epileptiform discharges does not rule out a clinical diagnosis of epilepsy.  Clinical correlation is advised.  LE venous Doppler There is evidence of acute deep vein thrombosis involving the peroneal veins of the right lower extremity.  There is no evidence of superficial vein thrombosis involving the right lower extremity. There is no evidence of deep or superficial vein thrombosis involving the left lower extremity. There is no evidence of a Baker's cyst bilaterally.  Carotid Doppler Findings are consistent with a 1-39 percent stenosis involving the right internal carotid artery and the left internal carotid artery. The vertebral arteries demonstrate antegrade flow.   TEE Left ventricle: The cavity size was normal. Wall thickness was   increased in a pattern of mild LVH. Systolic function was normal.   The estimated ejection fraction was in the range of 60% to 65%.   Wall motion was normal; there were no regional wall motion   abnormalities. Features are consistent with a pseudonormal left   ventricular filling pattern, with concomitant abnormal relaxation   and increased filling pressure (grade 2  diastolic dysfunction). - Aortic valve: There was mild regurgitation. Valve area (VTI):   1.76 cm^2. Valve area (Vmax): 2.02 cm^2. Valve area (Vmean): 1.95 cm^2. - Mitral valve: Valve area by pressure half-time: 2.47 cm^2.   PHYSICAL EXAM  Temp:  [97.4 F (36.3 C)-100.8 F (38.2 C)] 100.8 F (38.2 C) (02/16 1615) Pulse Rate:  [50-97] 85 (02/16 1700) Resp:  [14-33] 26 (02/16 1700) BP: (107-186)/(53-93) 132/54 (02/16 1700) SpO2:  [94 %-100 %] 100 % (02/16 1700) FiO2 (%):  [30 %-40 %] 30 % (02/16 1700) Weight:  [133 lb 13.1 oz (60.7 kg)-138 lb 14.2 oz (63 kg)] 133 lb 13.1 oz (60.7 kg) (02/16 0433)  General - cachectic, well developed, intubated on sedation.  Ophthalmologic - Fundi not visualized due to left gaze.  Cardiovascular - Regular rate and rhythm.  Neuro - intubated on sedation, not open eyes on voice or pains relation eyes deviated to left, doll's eye present, however not able to cross midline. No response to visual threat. Pupil 2.5 mm bilaterally, sluggish to light. Corneal positive and gag cough positive. Facial symmetry and found position cannot be tested due to ET tube. On pain stimulation, mild withdrawal in all 4 extremities. DTR diminished, no Babinski. Sensation, coronation and gait not tested.   ASSESSMENT/PLAN April Franklin is a 76 y.o. female with history of colon cancer, Metastatic pancreatobiliary adenocarcinoma, HTN, HLD and DM as long as they have seventh and the others. No  murmurs, IME and hospital has website blocks. Hospital has website blocks; cannot see presenting with progressive altered mental state, HA and double vision. She did not receive IV t-PA due to delay in arrival.   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 Infarct embolic likely secondary to hypercoagulable state from metastatic colon cancer.  Resultant intubated on  sedation  CT head subacute infarct R parietal lobe, no frank hematoma   MRI  extensive multi vascular supra and infratentorial infarcts. Right petechial hemorrhage.  MRA  basilar artery and R PCA occlusion, I suspect this is chronic changes due to lack of brainstem and PCA ischemia.   Cerebral angiogram TICI 3 revascularization of occluded basilar artery  2D Echo  EF 60-65%   Carotid Doppler unremarkable   LE venous doppler  - right popliteal vein DVT  EEG diffuse slowing with superimposed left centrotemporal slowing   LDL 110  HgbA1c pending  Lovenox 40 mg sq daily  for VTE prophylaxis  Diet NPO time specified  No antithrombotic prior to admission, now on aspirin 325 mg daily.   Ongoing aggressive stroke risk factor management  Therapy recommendations:  Pending  Disposition:  Pending  Acute respiratory failure   Intubated for airway protection   CCM on board  We are sedation as able  Right LE DVT - likely due to hypercoagulable state secondary to metastatic colon cancer  Not candidate for anticoagulation due to large infarct with petechial hemorrhage  Will discuss with family regarding no aggressive treatment vs. 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/16  Likely the source of hypercoagulable state  Hypertension  Stable  Long-term BP goal normotensive  Hyperlipidemia  Home meds:  No statin  LDL 110, goal < 70  No statin due to elevated LFTs and liver metastasis  Diabetes  HgbA1c pending  goal < 7.0  On Lantus  SSI  CBG monitoring  Other Stroke Risk Factors  Advanced age  Other Active Problems  Elevated troponins - trending down, likely demand ischemia  Arthritis  Anemia - hemoglobin stable  Hospital day # 1  This patient is  critically ill due to extensive embolic stroke s/p mechanical thrombectomy for the last rate occlusion and at significant risk of neurological worsening, death form recurrent stroke, hemorrhagic transformation, seizure, cerebral edema, pulmonary emboli. This patient's care requires constant monitoring of vital signs, hemodynamics, respiratory and cardiac monitoring, review of multiple databases, neurological assessment, discussion with family, other specialists and medical decision making of high complexity. I spent 45 minutes of neurocritical care time in the care of this patient.  Rosalin Hawking, MD PhD Stroke Neurology 01/10/2017 5:52 PM   To contact Stroke Continuity provider, please refer to [email or web address] After hours, contact General Neurology

## 2017-01-10 NOTE — Procedures (Signed)
ELECTROENCEPHALOGRAM REPORT  Date of Study: 01/10/2017  Patient's Name: April Franklin MRN: [id number] Date of Birth: 27-Sep-1941  Referring Provider: Dr. Shanon Brow Tat  Clinical History: This is a 76 year old woman with stroke, progressive mental status changes.   Medications: propofol (DIPRIVAN) 1000 MG/100ML infusion  acetaminophen (TYLENOL) tablet 650 mg  aspirin tablet 325 mg  ceFAZolin (ANCEF) 2-4 GM/100ML-% IVPB  chlorhexidine gluconate (MEDLINE KIT) (PERIDEX) 0.12 % solution 15 mL  clevidipine (CLEVIPREX) infusion 0.5 mg/mL  enoxaparin (LOVENOX) injection 40 mg  hydrALAZINE (APRESOLINE) injection 5 mg  insulin aspart (novoLOG) injection 0-9 Units  insulin glargine (LANTUS) injection 5 Units  MEDLINE mouth rinse  nitroGLYCERIN 100 MCG/ML intra-arterial injection  ondansetron (ZOFRAN) injection 4 mg  pantoprazole (PROTONIX) injection 40 mg  senna-docusate (Senokot-S) tablet 1 tablet   Technical Summary: A multichannel digital EEG recording measured by the international 10-20 system with electrodes applied with paste and impedances below 5000 ohms performed as portable with EKG monitoring in an intubated and sedated patient.  Hyperventilation and photic stimulation were not performed.  The digital EEG was referentially recorded, reformatted, and digitally filtered in a variety of bipolar and referential montages for optimal display.   Description: The patient is intubated and sedated on Propofol during the recording. There is no clear posterior dominant rhythm. The background consists of a large amount of diffuse 4-5 Hz theta and 2-3 Hz delta slowing with additional occasional focal delta slowing over the left centrotemporal region. There are occasional triphasic-like waves seen, more with stimulation. During drowsiness and sleep, there is an increase in theta and delta slowing of the background, with poorly formed vertex waves and sleep spindles seen.  Hyperventilation and photic  stimulation were not performed.  There were no epileptiform discharges or electrographic seizures seen.    EKG lead was unremarkable.  Impression: This sedated EEG is abnormal due to the presence of: 1. Moderate diffuse slowing of the background 2. Additional focal slowing over the left centrotemporal region  Clinical Correlation of the above findings indicates diffuse cerebral dysfunction that is non-specific in etiology and can be seen with hypoxic/ischemic injury, toxic/metabolic encephalopathies, or medication effect from Propofol. Additional focal slowing over the left centrotemporal region indicates focal cerebral dysfunction in this region suggestive of underlying structural or physiologic abnormality. There were no electrographic seizures in this study.  The absence of epileptiform discharges does not rule out a clinical diagnosis of epilepsy.  Clinical correlation is advised.   Ellouise Newer, M.D.

## 2017-01-10 NOTE — Progress Notes (Signed)
OT Cancellation Note  Patient Details Name: April Franklin MRN: [id number] DOB: [birth year]   Cancelled Treatment:    Reason Eval/Treat Not Completed: Medical issues which prohibited therapy. Pt with increasing troponins. Will assess when medically appropriate.   [place], OT/L  [id number] 01/10/2017 01/10/2017, 7:50 AM

## 2017-01-10 NOTE — Progress Notes (Signed)
Referring Physician(s): Dr Lavera Guise  Supervising Physician: Luanne Bras  Patient Status:  [facility] - In-pt  Chief Complaint:  CVA S/P bilateral veretbral artery angiograms followed by endovascular complete revascularization of occluded basilar artery  X 1pass with trevoprovue 34mm x30 mm retrieval device ,and x 2 passes with solitaire FR 74mm x 40 mm devise achieving a TICI 3 reperfusion. Also used 10.4 mg of IA superselective Integrelin  Subjective:   DM, HTN, colon cancer s/p resection 2009. 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 She underwent IR intervention with revascularization and is now in ICU on vent   vent now No response   Allergies: Patient has no known allergies.  Medications: Prior to Admission medications   Medication Sig Start Date End Date Taking? Authorizing Provider  cephALEXin (KEFLEX) 500 MG capsule Take 1 capsule (500 mg total) by mouth 2 (two) times daily. 01/04/17  Yes Kristen N Ward, DO  glipiZIDE (GLUCOTROL) 10 MG tablet Take 10 mg by mouth daily.   Yes Historical Provider, MD  Insulin Degludec (TRESIBA FLEXTOUCH) 200 UNIT/ML SOPN Inject 20 Units into the muscle every evening. After dinner 11/07/16  Yes Historical Provider, MD  linagliptin (TRADJENTA) 5 MG TABS tablet Take 5 mg by mouth daily.   Yes Historical Provider, MD  losartan (COZAAR) 50 MG tablet Take 50 mg by mouth daily.   Yes Historical Provider, MD  metFORMIN (GLUCOPHAGE) 1000 MG tablet Take 1,000 mg by mouth daily with breakfast.   Yes Historical Provider, MD  traMADol (ULTRAM) 50 MG tablet Take 50 mg by mouth every 6 (six) hours as needed for moderate pain.   Yes Historical Provider, MD     Vital Signs: BP 133/68   Pulse 87   Temp 97.9 F  (36.6 C) (Axillary)   Resp (!) 21   Ht 5\' 2"  (1.575 m)   Wt 133 lb 13.1 oz (60.7 kg)   SpO2 100%   BMI 24.48 kg/m   Physical Exam  Pulmonary/Chest:  vent  Abdominal: Soft.  Neurological:  No response  Skin: Skin is warm.  Right groin site clean and dry; no bleeding No hematoma Rt foot 2+ pusles  Nursing note and vitals reviewed.   Imaging: Ct Head Wo Contrast  Result Date: 01/10/2017 CLINICAL DATA:  Follow-up stroke. Status post revascularization of occluded basilar artery. History of hypertension, diabetes, colon cancer. EXAM: CT HEAD WITHOUT CONTRAST TECHNIQUE: Contiguous axial images were obtained from the base of the skull through the vertex without intravenous contrast. COMPARISON:  CT HEAD and MRI head January 09, 2015 FINDINGS: BRAIN: Evolving large RIGHT cerebellar infarct with faint central density. Partially effaced fourth ventricle, increased from prior imaging. Small LEFT cerebellar infarcts. Evolving RIGHT parietal occipital lobe infarcts with faint density. Additional scattered small infarcts as seen on yesterday's MRI. No lobar hematoma. Ventricles and sulci are overall normal for patient's age. No abnormal extra-axial fluid collections. Basal cisterns are patent. VASCULAR: Moderate calcific atherosclerosis of the carotid siphons. SKULL: No skull fracture. No significant scalp soft tissue swelling. SINUSES/ORBITS: The mastoid air-cells and included paranasal sinuses are well-aerated.The included ocular globes and orbital contents are non-suspicious. OTHER: None. IMPRESSION: Evolving multifocal infarcts with worsening RIGHT cerebellar cytotoxic edema partially effacing the fourth ventricle. No obstructive hydrocephalus. Contrast staining and/or petechial  hemorrhage RIGHT parieto-occipital lobe and RIGHT cerebellar infarcts without frank hemorrhagic conversion. Electronically Signed   By: Elon Alas M.D.   On: 01/10/2017 04:34   Ct Head Wo Contrast  Result Date:  01/09/2017 CLINICAL DATA:  Altered mental status and headache EXAM: CT HEAD WITHOUT CONTRAST TECHNIQUE: Contiguous axial images were obtained from the base of the skull through the vertex without intravenous contrast. COMPARISON:  Head CT 01/04/2017 FINDINGS: Brain: There is a new focal area of hypoattenuation at the cortex of the right parietal lobe with loss of gray-white differentiation, consistent with acute infarct. No midline shift or mass effect. No extra-axial collection. No hemorrhage. There is periventricular hypoattenuation compatible with chronic microvascular disease. There is bilateral basal ganglia mineralization. Vascular: Atherosclerotic calcification of the vertebral and internal carotid arteries at the skull base. Skull: Normal visualized skull base, calvarium and extracranial soft tissues. Sinuses/Orbits: No sinus fluid levels or advanced mucosal thickening. No mastoid effusion. Normal orbits. IMPRESSION: 1. Subacute infarct of the posterosuperior right parietal lobe with expected evolution since the CT of 01/04/2017. No hemorrhage or mass effect. MRI could be obtained for further characterization. 2. Chronic microvascular ischemia. These results were called by telephone at the time of interpretation on 01/09/2017 at 2:34 pm to Dr. Quintella Reichert , who verbally acknowledged these results. Electronically Signed   By: Ulyses Jarred M.D.   On: 01/09/2017 14:37   Mr [name] Head Wo Contrast  Result Date: 01/10/2017 CLINICAL DATA:  Progressive altered mental status and confusion for several weeks. Headache. History of colon cancer, hypertension, diabetes. EXAM: MRI HEAD WITHOUT CONTRAST MRA HEAD WITHOUT CONTRAST TECHNIQUE: Multiplanar, multiecho pulse sequences of the brain and surrounding structures were obtained without intravenous contrast. Angiographic images of the head were obtained using MRA technique without contrast. COMPARISON:  CT HEAD January 09, 2017 at 1419 hours FINDINGS: MRI HEAD  FINDINGS BRAIN: Patchy reduced diffusion RIGHT greater than LEFT (multi vascular territory) cerebellum. Scattered subcentimeter foci of reduced diffusion midbrain, LEFT temporal lobe and RIGHT basal ganglia. Confluent RIGHT greater than LEFT occipital lobe reduced diffusion and, RIGHT mesial temporal lobe. Patchy reduced diffusion predominately within white matter bilateral frontoparietal lobes. Low ADC values in T2 bright signal within all areas of reduced diffusion consistent with similar age. Punctate susceptibility artifact LEFT thalamus. Petechial hemorrhage RIGHT parietal and occipital lobes, to lesser extent RIGHT cerebellum. Ventricles and sulci are overall normal for patient's age. No midline shift. No masses. No abnormal extra-axial fluid collections. VASCULAR: T2 bright signal within the basilar artery consistent with thromboembolism. SKULL AND UPPER CERVICAL SPINE: No abnormal sellar expansion. No suspicious calvarial bone marrow signal. Craniocervical junction maintained. SINUSES/ORBITS: The mastoid air-cells and included paranasal sinuses are well-aerated. Status post RIGHT ocular lens implant. The included ocular globes and orbital contents are non-suspicious. OTHER: None. MRA HEAD FINDINGS ANTERIOR CIRCULATION: Normal flow related enhancement of the included cervical, petrous, cavernous and supraclinoid internal carotid arteries. Patent anterior communicating artery. Normal flow related enhancement of the anterior and middle cerebral arteries, including distal segments. No large vessel occlusion, high-grade stenosis, abnormal luminal irregularity, aneurysm. POSTERIOR CIRCULATION: RIGHT vertebral artery is dominant. Loss of basilar artery flow related enhancement. Reconstitution via LEFT posterior communicating artery. Occluded RIGHT posterior cerebral artery from the origin. No collaterals identified which may reflect time-of-flight technique. No high-grade stenosis, abnormal luminal irregularity,  aneurysm. ANATOMIC VARIANTS: None. IMPRESSION: MRI HEAD: Acute extensive multi vascular territory supra and infratentorial infarcts. RIGHT petechial hemorrhage without hemorrhagic conversion. MRA HEAD: Emergent basilar artery and RIGHT posterior  cerebral artery occlusion. Reconstitution of basilar tip and LEFT posterior cerebral artery via LEFT posterior communicating artery. Acute findings discussed with and reconfirmed by Dr.MCNEILL [facility] on 01/09/2017 at 11:59 pm. Electronically Signed   By: Elon Alas M.D.   On: 01/10/2017 00:03   Mr Brain Wo Contrast  Result Date: 01/10/2017 CLINICAL DATA:  Progressive altered mental status and confusion for several weeks. Headache. History of colon cancer, hypertension, diabetes. EXAM: MRI HEAD WITHOUT CONTRAST MRA HEAD WITHOUT CONTRAST TECHNIQUE: Multiplanar, multiecho pulse sequences of the brain and surrounding structures were obtained without intravenous contrast. Angiographic images of the head were obtained using MRA technique without contrast. COMPARISON:  CT HEAD January 09, 2017 at 1419 hours FINDINGS: MRI HEAD FINDINGS BRAIN: Patchy reduced diffusion RIGHT greater than LEFT (multi vascular territory) cerebellum. Scattered subcentimeter foci of reduced diffusion midbrain, LEFT temporal lobe and RIGHT basal ganglia. Confluent RIGHT greater than LEFT occipital lobe reduced diffusion and, RIGHT mesial temporal lobe. Patchy reduced diffusion predominately within white matter bilateral frontoparietal lobes. Low ADC values in T2 bright signal within all areas of reduced diffusion consistent with similar age. Punctate susceptibility artifact LEFT thalamus. Petechial hemorrhage RIGHT parietal and occipital lobes, to lesser extent RIGHT cerebellum. Ventricles and sulci are overall normal for patient's age. No midline shift. No masses. No abnormal extra-axial fluid collections. VASCULAR: T2 bright signal within the basilar artery consistent with  thromboembolism. SKULL AND UPPER CERVICAL SPINE: No abnormal sellar expansion. No suspicious calvarial bone marrow signal. Craniocervical junction maintained. SINUSES/ORBITS: The mastoid air-cells and included paranasal sinuses are well-aerated. Status post RIGHT ocular lens implant. The included ocular globes and orbital contents are non-suspicious. OTHER: None. MRA HEAD FINDINGS ANTERIOR CIRCULATION: Normal flow related enhancement of the included cervical, petrous, cavernous and supraclinoid internal carotid arteries. Patent anterior communicating artery. Normal flow related enhancement of the anterior and middle cerebral arteries, including distal segments. No large vessel occlusion, high-grade stenosis, abnormal luminal irregularity, aneurysm. POSTERIOR CIRCULATION: RIGHT vertebral artery is dominant. Loss of basilar artery flow related enhancement. Reconstitution via LEFT posterior communicating artery. Occluded RIGHT posterior cerebral artery from the origin. No collaterals identified which may reflect time-of-flight technique. No high-grade stenosis, abnormal luminal irregularity, aneurysm. ANATOMIC VARIANTS: None. IMPRESSION: MRI HEAD: Acute extensive multi vascular territory supra and infratentorial infarcts. RIGHT petechial hemorrhage without hemorrhagic conversion. 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 Reconstitution of basilar tip and LEFT posterior cerebral artery via LEFT posterior communicating artery. Acute findings discussed with and reconfirmed by Dr.MCNEILL [facility] on 01/09/2017 at 11:59 pm. Electronically Signed   By: Elon Alas M.D.   On: 01/10/2017 00:03   Portable Chest Xray  Result Date: 01/10/2017 CLINICAL DATA:  Endotracheal tube placement after IR procedure. EXAM: PORTABLE CHEST 1 VIEW COMPARISON:  Chest radiograph August 24, 2010 FINDINGS: Endotracheal tube tip projects 2.2 cm above the carina. Nasogastric tube past proximal  stomach, distal tip out of field-of-view. Cardiomediastinal silhouette is normal. No pleural effusions or focal consolidations. Trachea projects midline and there is no pneumothorax. Soft tissue planes and included osseous structures are non-suspicious. IMPRESSION: Endotracheal tube tip projects 2.2 cm above the carina. Nasogastric tube past proximal stomach. No acute cardiopulmonary process. Electronically Signed   By: Elon Alas M.D.   On: 01/10/2017 05:33    Labs:  CBC:  Recent Labs  01/04/17 0353 01/09/17 1356 01/10/17 0526  WBC 5.1 5.9 7.2  HGB 10.4* 10.6* 10.6*  HCT 30.7* 31.2* 31.8*  PLT 146* 127* 152  COAGS:  Recent Labs  01/10/17 0103  INR 1.28  APTT 27    BMP:  Recent Labs  01/04/17 0353 01/09/17 1356 01/10/17 0526  NA 134* 137 143  K 5.1 3.5 3.6  CL 98* 104 105  CO2 22 25 24   GLUCOSE 328* 120* 154*  BUN 15 15 9   CALCIUM 10.3 9.7 10.0  CREATININE 0.90 0.83 0.86  GFRNONAA >60 >60 >60  GFRAA >60 >60 >60    LIVER FUNCTION TESTS:  Recent Labs  01/04/17 0353 01/09/17 1356 01/10/17 0104  BILITOT 1.6* 1.3* 1.1  AST 119* 87* 79*  ALT 152* 118* 117*  ALKPHOS 329* 337* 341*  PROT 6.0* 6.2* 6.5  ALBUMIN 3.5 3.3* 3.4*    Assessment and Plan:  CVA Basilar artery angioplasty/stent placement in IR with Dr Estanislado Pandy 2/15 Will follow Plan per Stroke team  Electronically Signed: Naftali Carchi A 01/10/2017, 9:51 AM   I spent a total of 15 Minutes at the the patient's bedside AND on the patient's hospital floor or unit, greater than 50% of which was counseling/coordinating care for basilar artery revascularization

## 2017-01-10 NOTE — Progress Notes (Signed)
Bedside EEG completed, results pending. 

## 2017-01-10 NOTE — Anesthesia Preprocedure Evaluation (Signed)
Anesthesia Evaluation  Patient identified by MRN, date of birth, ID band Patient unresponsive    Reviewed: Patient's Chart, lab work & pertinent test results, Unable to perform ROS - Chart review only  Airway Mallampati: II       Dental  (+) Teeth Intact   Pulmonary    breath sounds clear to auscultation       Cardiovascular hypertension,  Rhythm:Regular Rate:Normal     Neuro/Psych    GI/Hepatic   Endo/Other  diabetes  Renal/GU      Musculoskeletal   Abdominal   Peds  Hematology   Anesthesia Other Findings   Reproductive/Obstetrics                             Anesthesia Physical Anesthesia Plan  ASA: IV and emergent  Anesthesia Plan: General   Post-op Pain Management:    Induction: Intravenous  Airway Management Planned: Oral ETT  Additional Equipment:   Intra-op Plan:   Post-operative Plan: Post-operative intubation/ventilation  Informed Consent: I have reviewed the patients History and Physical, chart, labs and discussed the procedure including the risks, benefits and alternatives for the proposed anesthesia with the patient or authorized representative who has indicated his/her understanding and acceptance.     Plan Discussed with: CRNA and Anesthesiologist  Anesthesia Plan Comments:         Anesthesia Quick Evaluation

## 2017-01-11 ENCOUNTER — Inpatient Hospital Stay (HOSPITAL_COMMUNITY): Payer: Medicare Other

## 2017-01-11 DIAGNOSIS — I6349 Cerebral infarction due to embolism of other cerebral artery: Secondary | ICD-10-CM

## 2017-01-11 DIAGNOSIS — E1165 Type 2 diabetes mellitus with hyperglycemia: Secondary | ICD-10-CM

## 2017-01-11 DIAGNOSIS — Z794 Long term (current) use of insulin: Secondary | ICD-10-CM

## 2017-01-11 DIAGNOSIS — I6312 Cerebral infarction due to embolism of basilar artery: Secondary | ICD-10-CM

## 2017-01-11 LAB — HEMOGLOBIN A1C
Hgb A1c MFr Bld: 7.2 % — ABNORMAL HIGH (ref 4.8–5.6)
MEAN PLASMA GLUCOSE: 160 mg/dL

## 2017-01-11 LAB — BASIC METABOLIC PANEL
ANION GAP: 12 (ref 5–15)
BUN: 9 mg/dL (ref 6–20)
CHLORIDE: 107 mmol/L (ref 101–111)
CO2: 24 mmol/L (ref 22–32)
Calcium: 10.1 mg/dL (ref 8.9–10.3)
Creatinine, Ser: 0.76 mg/dL (ref 0.44–1.00)
GFR calc non Af Amer: >60 mL/min (ref >60)
Glucose, Bld: 185 mg/dL — ABNORMAL HIGH (ref 65–99)
Potassium: 3.5 mmol/L (ref 3.5–5.1)
Sodium: 143 mmol/L (ref 135–145)

## 2017-01-11 LAB — GLUCOSE, CAPILLARY
GLUCOSE-CAPILLARY: 162 mg/dL — AB (ref 65–99)
GLUCOSE-CAPILLARY: 131 mg/dL — AB (ref 65–99)
Glucose-Capillary: 142 mg/dL — ABNORMAL HIGH (ref 65–99)
Glucose-Capillary: 155 mg/dL — ABNORMAL HIGH (ref 65–99)
Glucose-Capillary: 163 mg/dL — ABNORMAL HIGH (ref 65–99)
Glucose-Capillary: 164 mg/dL — ABNORMAL HIGH (ref 65–99)

## 2017-01-11 LAB — PHOSPHORUS: PHOSPHORUS: 3.1 mg/dL (ref 2.5–4.6)

## 2017-01-11 LAB — MAGNESIUM: Magnesium: 1.5 mg/dL — ABNORMAL LOW (ref 1.7–2.4)

## 2017-01-11 MED ORDER — MAGNESIUM SULFATE 2 GM/50ML IV SOLN
2.0000 g | Freq: Once | INTRAVENOUS | Status: AC
  Administered 2017-01-11: 2 g via INTRAVENOUS
  Filled 2017-01-11: qty 50

## 2017-01-11 NOTE — Progress Notes (Signed)
Referring Physician(s): DR Lavera Guise  Supervising Physician: Luanne Bras  Patient Status:  [facility] - In-pt  Chief Complaint:  CVA Basilar artery revascularization 2/16 am  Subjective:  Intubated Vent No response   Allergies: Patient has no known allergies.  Medications: Prior to Admission medications   Medication Sig Start Date End Date Taking? Authorizing Provider  cephALEXin (KEFLEX) 500 MG capsule Take 1 capsule (500 mg total) by mouth 2 (two) times daily. 01/04/17  Yes Kristen N Ward, DO  glipiZIDE (GLUCOTROL) 10 MG tablet Take 10 mg by mouth daily.   Yes Historical Provider, MD  Insulin Degludec (TRESIBA FLEXTOUCH) 200 UNIT/ML SOPN Inject 20 Units into the muscle every evening. After dinner 11/07/16  Yes Historical Provider, MD  linagliptin (TRADJENTA) 5 MG TABS tablet Take 5 mg by mouth daily.   Yes Historical Provider, MD  losartan (COZAAR) 50 MG tablet Take 50 mg by mouth daily.   Yes Historical Provider, MD  metFORMIN (GLUCOPHAGE) 1000 MG tablet Take 1,000 mg by mouth daily with breakfast.   Yes Historical Provider, MD  traMADol (ULTRAM) 50 MG tablet Take 50 mg by mouth every 6 (six) hours as needed for moderate pain.   Yes Historical Provider, MD     Vital Signs: BP (!) 137/59   Pulse 86   Temp 99.8 F (37.7 C) (Axillary)   Resp (!) 25   Ht 5\' 2"  (1.575 m)   Wt 133 lb 13.1 oz (60.7 kg)   SpO2 100%   BMI 24.48 kg/m   Physical Exam  Abdominal: Soft.  Musculoskeletal:  No movement  Neurological:  No respnse  Skin: Skin is warm and dry.  Rt groin NT no bleeding Soft No hematoma  Rt foot 2+ pulses  Nursing note and vitals reviewed.   Imaging: Ct Head Wo Contrast  Result Date: 01/10/2017 CLINICAL DATA:  Follow-up stroke. Status post revascularization of occluded basilar artery. History of hypertension, diabetes, colon cancer. EXAM: CT HEAD WITHOUT CONTRAST TECHNIQUE: Contiguous axial images were obtained from the base of the skull through the  vertex without intravenous contrast. COMPARISON:  CT HEAD and MRI head January 09, 2015 FINDINGS: BRAIN: Evolving large RIGHT cerebellar infarct with faint central density. Partially effaced fourth ventricle, increased from prior imaging. Small LEFT cerebellar infarcts. Evolving RIGHT parietal occipital lobe infarcts with faint density. Additional scattered small infarcts as seen on yesterday's MRI. No lobar hematoma. Ventricles and sulci are overall normal for patient's age. No abnormal extra-axial fluid collections. Basal cisterns are patent. VASCULAR: Moderate calcific atherosclerosis of the carotid siphons. SKULL: No skull fracture. No significant scalp soft tissue swelling. SINUSES/ORBITS: The mastoid air-cells and included paranasal sinuses are well-aerated.The included ocular globes and orbital contents are non-suspicious. OTHER: None. IMPRESSION: Evolving multifocal infarcts with worsening RIGHT cerebellar cytotoxic edema partially effacing the fourth ventricle. No obstructive hydrocephalus. Contrast staining and/or petechial hemorrhage RIGHT parieto-occipital lobe and RIGHT cerebellar infarcts without frank hemorrhagic conversion. Electronically Signed   By: Elon Alas M.D.   On: 01/10/2017 04:34   Ct Head Wo Contrast  Result Date: 01/09/2017 CLINICAL DATA:  Altered mental status and headache EXAM: CT HEAD WITHOUT CONTRAST TECHNIQUE: Contiguous axial images were obtained from the base of the skull through the vertex without intravenous contrast. COMPARISON:  Head CT 01/04/2017 FINDINGS: Brain: There is a new focal area of hypoattenuation at the cortex of the right parietal lobe with loss of gray-white differentiation, consistent with acute infarct. No midline shift or mass effect. No extra-axial collection. No  hemorrhage. There is periventricular hypoattenuation compatible with chronic microvascular disease. There is bilateral basal ganglia mineralization. Vascular: Atherosclerotic calcification  of the vertebral and internal carotid arteries at the skull base. Skull: Normal visualized skull base, calvarium and extracranial soft tissues. Sinuses/Orbits: No sinus fluid levels or advanced mucosal thickening. No mastoid effusion. Normal orbits. IMPRESSION: 1. Subacute infarct of the posterosuperior right parietal lobe with expected evolution since the CT of 01/04/2017. No hemorrhage or mass effect. MRI could be obtained for further characterization. 2. Chronic microvascular ischemia. These results were called by telephone at the time of interpretation on 01/09/2017 at 2:34 pm to Dr. Quintella Reichert , who verbally acknowledged these results. Electronically Signed   By: Ulyses Jarred M.D.   On: 01/09/2017 14:37   Mr [name] Head Wo Contrast  Result Date: 01/10/2017 CLINICAL DATA:  Progressive altered mental status and confusion for several weeks. Headache. History of colon cancer, hypertension, diabetes. EXAM: MRI HEAD WITHOUT CONTRAST MRA HEAD WITHOUT CONTRAST TECHNIQUE: Multiplanar, multiecho pulse sequences of the brain and surrounding structures were obtained without intravenous contrast. Angiographic images of the head were obtained using MRA technique without contrast. COMPARISON:  CT HEAD January 09, 2017 at 1419 hours FINDINGS: MRI HEAD FINDINGS BRAIN: Patchy reduced diffusion RIGHT greater than LEFT (multi vascular territory) cerebellum. Scattered subcentimeter foci of reduced diffusion midbrain, LEFT temporal lobe and RIGHT basal ganglia. Confluent RIGHT greater than LEFT occipital lobe reduced diffusion and, RIGHT mesial temporal lobe. Patchy reduced diffusion predominately within white matter bilateral frontoparietal lobes. Low ADC values in T2 bright signal within all areas of reduced diffusion consistent with similar age. Punctate susceptibility artifact LEFT thalamus. Petechial hemorrhage RIGHT parietal and occipital lobes, to lesser extent RIGHT cerebellum. Ventricles and sulci are overall normal  for patient's age. No midline shift. No masses. No abnormal extra-axial fluid collections. VASCULAR: T2 bright signal within the basilar artery consistent with thromboembolism. SKULL AND UPPER CERVICAL SPINE: No abnormal sellar expansion. No suspicious calvarial bone marrow signal. Craniocervical junction maintained. SINUSES/ORBITS: The mastoid air-cells and included paranasal sinuses are well-aerated. Status post RIGHT ocular lens implant. The included ocular globes and orbital contents are non-suspicious. OTHER: None. MRA HEAD FINDINGS ANTERIOR CIRCULATION: Normal flow related enhancement of the included cervical, petrous, cavernous and supraclinoid internal carotid arteries. Patent anterior communicating artery. Normal flow related enhancement of the anterior and middle cerebral arteries, including distal segments. No large vessel occlusion, high-grade stenosis, abnormal luminal irregularity, aneurysm. POSTERIOR CIRCULATION: RIGHT vertebral artery is dominant. Loss of basilar artery flow related enhancement. Reconstitution via LEFT posterior communicating artery. Occluded RIGHT posterior cerebral artery from the origin. No collaterals identified which may reflect time-of-flight technique. No high-grade stenosis, abnormal luminal irregularity, aneurysm. ANATOMIC VARIANTS: None. IMPRESSION: MRI HEAD: Acute extensive multi vascular territory supra and infratentorial infarcts. RIGHT petechial hemorrhage without hemorrhagic conversion. 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 Reconstitution of basilar tip and LEFT posterior cerebral artery via LEFT posterior communicating artery. Acute findings discussed with and reconfirmed by Dr.MCNEILL [facility] on 01/09/2017 at 11:59 pm. Electronically Signed   By: Elon Alas M.D.   On: 01/10/2017 00:03   Mr Brain Wo Contrast  Result Date: 01/10/2017 CLINICAL DATA:  Progressive altered mental status and confusion for several  weeks. Headache. History of colon cancer, hypertension, diabetes. EXAM: MRI HEAD WITHOUT CONTRAST MRA HEAD WITHOUT CONTRAST TECHNIQUE: Multiplanar, multiecho pulse sequences of the brain and surrounding structures were obtained without intravenous contrast. Angiographic images of the head were obtained using MRA technique  without contrast. COMPARISON:  CT HEAD January 09, 2017 at 1419 hours FINDINGS: MRI HEAD FINDINGS BRAIN: Patchy reduced diffusion RIGHT greater than LEFT (multi vascular territory) cerebellum. Scattered subcentimeter foci of reduced diffusion midbrain, LEFT temporal lobe and RIGHT basal ganglia. Confluent RIGHT greater than LEFT occipital lobe reduced diffusion and, RIGHT mesial temporal lobe. Patchy reduced diffusion predominately within white matter bilateral frontoparietal lobes. Low ADC values in T2 bright signal within all areas of reduced diffusion consistent with similar age. Punctate susceptibility artifact LEFT thalamus. Petechial hemorrhage RIGHT parietal and occipital lobes, to lesser extent RIGHT cerebellum. Ventricles and sulci are overall normal for patient's age. No midline shift. No masses. No abnormal extra-axial fluid collections. VASCULAR: T2 bright signal within the basilar artery consistent with thromboembolism. SKULL AND UPPER CERVICAL SPINE: No abnormal sellar expansion. No suspicious calvarial bone marrow signal. Craniocervical junction maintained. SINUSES/ORBITS: The mastoid air-cells and included paranasal sinuses are well-aerated. Status post RIGHT ocular lens implant. The included ocular globes and orbital contents are non-suspicious. OTHER: None. MRA HEAD FINDINGS ANTERIOR CIRCULATION: Normal flow related enhancement of the included cervical, petrous, cavernous and supraclinoid internal carotid arteries. Patent anterior communicating artery. Normal flow related enhancement of the anterior and middle cerebral arteries, including distal segments. No large vessel  occlusion, high-grade stenosis, abnormal luminal irregularity, aneurysm. POSTERIOR CIRCULATION: RIGHT vertebral artery is dominant. Loss of basilar artery flow related enhancement. Reconstitution via LEFT posterior communicating artery. Occluded RIGHT posterior cerebral artery from the origin. No collaterals identified which may reflect time-of-flight technique. No high-grade stenosis, abnormal luminal irregularity, aneurysm. ANATOMIC VARIANTS: None. IMPRESSION: MRI HEAD: Acute extensive multi vascular territory supra and infratentorial infarcts. RIGHT petechial hemorrhage without hemorrhagic conversion. 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 Reconstitution of basilar tip and LEFT posterior cerebral artery via LEFT posterior communicating artery. Acute findings discussed with and reconfirmed by Dr.MCNEILL [facility] on 01/09/2017 at 11:59 pm. Electronically Signed   By: Elon Alas M.D.   On: 01/10/2017 00:03   Dg Chest Port 1 View  Result Date: 01/11/2017 CLINICAL DATA:  Initial evaluation for ventilator management. EXAM: PORTABLE CHEST 1 VIEW COMPARISON:  Prior radiograph from 01/10/2017. FINDINGS: Patient is intubated with the tip of an endotracheal tube positioned 2.5 cm above the carina. Enteric tube courses in the the abdomen. Cardiac and mediastinal silhouettes are stable, and remain within normal limits. Aortic atherosclerosis noted. Lungs are hypoinflated with elevation left hemidiaphragm, stable. No focal infiltrates, pulmonary edema, or pleural effusion. No pneumothorax. Osseous structures unchanged. IMPRESSION: 1. Endotracheal tube in satisfactory position with the tip positioned 2.5 cm above the carina. 2. No radiographic evidence for acute cardiopulmonary abnormality. Electronically Signed   By: Jeannine Boga M.D.   On: 01/11/2017 04:52   Portable Chest Xray  Result Date: 01/10/2017 CLINICAL DATA:  Endotracheal tube placement after IR  procedure. EXAM: PORTABLE CHEST 1 VIEW COMPARISON:  Chest radiograph August 24, 2010 FINDINGS: Endotracheal tube tip projects 2.2 cm above the carina. Nasogastric tube past proximal stomach, distal tip out of field-of-view. Cardiomediastinal silhouette is normal. No pleural effusions or focal consolidations. Trachea projects midline and there is no pneumothorax. Soft tissue planes and included osseous structures are non-suspicious. IMPRESSION: Endotracheal tube tip projects 2.2 cm above the carina. Nasogastric tube past proximal stomach. No acute cardiopulmonary process. Electronically Signed   By: Elon Alas M.D.   On: 01/10/2017 05:33    Labs:  CBC:  Recent Labs  01/04/17 0353 01/09/17 1356 01/10/17 0526  WBC 5.1 5.9 7.2  HGB 10.4* 10.6* 10.6*  HCT 30.7* 31.2* 31.8*  PLT 146* 127* 152    COAGS:  Recent Labs  01/10/17 0103  INR 1.28  APTT 27    BMP:  Recent Labs  01/04/17 0353 01/09/17 1356 01/10/17 0526 01/11/17 0301  NA 134* 137 143 143  K 5.1 3.5 3.6 3.5  CL 98* 104 105 107  CO2 22 25 24 24   GLUCOSE 328* 120* 154* 185*  BUN 15 15 9 9   CALCIUM 10.3 9.7 10.0 10.1  CREATININE 0.90 0.83 0.86 0.76  GFRNONAA >60 >60 >60 >60  GFRAA >60 >60 >60 >60    LIVER FUNCTION TESTS:  Recent Labs  01/04/17 0353 01/09/17 1356 01/10/17 0104  BILITOT 1.6* 1.3* 1.1  AST 119* 87* 79*  ALT 152* 118* 117*  ALKPHOS 329* 337* 341*  PROT 6.0* 6.2* 6.5  ALBUMIN 3.5 3.3* 3.4*    Assessment and Plan:  CVA Intubated/vwnt No response Will follow  Electronically Signed: Autumnrose Yore A 01/11/2017, 10:09 AM   I spent a total of 15 Minutes at the the patient's bedside AND on the patient's hospital floor or unit, greater than 50% of which was counseling/coordinating care for CVA; basilar artery revascularization

## 2017-01-11 NOTE — Progress Notes (Signed)
STROKE TEAM PROGRESS NOTE   HISTORY OF PRESENT ILLNESS (per record) April Franklin is a 76 y.o. female with a history of colon cancer who has been having progressive altered mental status and confusion over the past several weeks. It was only mild until 2 days ago at which point it started getting progressively worse, then this morning when she was checked on, she did not arouse appropriately and therefore she was brought into the [facility] emergency department. She also has been complaining of headaches over the past few weeks as well. On CT, she has a area of hypodensity in the right parietal region consistent with a subacute infarct. She also has been complaining of double vision. She was last known well several days ago. Patient was not administered IV t-PA secondary to being outside of the window. MRA revealed basilar occlusion. Given continued progression of neurologic worsening, intervention was considered reasonable. She was sent to interventional radiology where she had TICI3 revascularization of an occluded basilar artery with one pass of the trevo and 2 passes of the solitaire as well as intra-arterial Integrilin  She was admitted to the neuro ICU for further evaluation and treatment.   SUBJECTIVE (INTERVAL HISTORY) Family at bedside considering comfort care or palliative. Spoke to daughters today. They would like to let their mother rest for a few days and then open discussions again on Monday.    OBJECTIVE Temp:  [97.9 F (36.6 C)-100.8 F (38.2 C)] 98.6 F (37 C) (02/17 0400) Pulse Rate:  [68-97] 85 (02/17 0700) Cardiac Rhythm: Normal sinus rhythm (02/16 2000) Resp:  [16-33] 24 (02/17 0700) BP: (101-149)/(38-80) 141/63 (02/17 0700) SpO2:  [100 %] 100 % (02/17 0700) FiO2 (%):  [30 %-40 %] 30 % (02/17 0339)  CBC:   Recent Labs Lab 01/09/17 1356 01/10/17 0526  WBC 5.9 7.2  NEUTROABS 4.0 5.5  HGB 10.6* 10.6*  HCT 31.2* 31.8*  MCV 83.6 84.4  PLT 127* 0000000    Basic Metabolic  Panel:   Recent Labs Lab 01/10/17 0526 01/11/17 0301  NA 143 143  K 3.6 3.5  CL 105 107  CO2 24 24  GLUCOSE 154* 185*  BUN 9 9  CREATININE 0.86 0.76  CALCIUM 10.0 10.1  MG  --  1.5*  PHOS  --  3.1    Lipid Panel:     Component Value Date/Time   CHOL 194 01/10/2017 0104   TRIG 102 01/10/2017 0526   HDL 67 01/10/2017 0104   CHOLHDL 2.9 01/10/2017 0104   VLDL 17 01/10/2017 0104   LDLCALC 110 (H) 01/10/2017 0104   HgbA1c:  Lab Results  Component Value Date   HGBA1C 7.2 (H) 01/10/2017   Urine Drug Screen: No results found for: LABOPIA, COCAINSCRNUR, LABBENZ, AMPHETMU, THCU, LABBARB    IMAGING I have personally reviewed the radiological images below and agree with the radiology interpretations.  Ct Head Wo Contrast 01/09/2017 1. Subacute infarct of the posterosuperior right parietal lobe with expected evolution since the CT of 01/04/2017. No hemorrhage or mass effect. MRI could be obtained for further characterization. 2. Chronic microvascular ischemia.   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 Reconstitution of basilar tip and LEFT posterior cerebral artery via LEFT posterior communicating artery.   Mr Brain Wo Contrast 01/10/2017 Acute extensive multi vascular territory supra and infratentorial infarcts. RIGHT petechial hemorrhage without hemorrhagic conversion.   Cerebral angiogram 01/10/2017 S/P bilateral veretbral artery angiograms followed by endovascular complete revascularization of  occluded basilar artery X 1pass with trevoprovue 78mm x30 mm retrieval device ,and x 2 passes with solitaire FR 38mm x 40 mm devise achieving a TICI 3 reperfusion. Also used 10.4 mg of IA superselective Integrelin  Ct Head Wo Contrast 01/10/2017 Evolving multifocal infarcts with worsening RIGHT cerebellar cytotoxic edema partially effacing the fourth ventricle. No obstructive hydrocephalus. Contrast staining and/or  petechial hemorrhage RIGHT parieto-occipital lobe and RIGHT cerebellar infarcts without frank hemorrhagic conversion.   EEG -  This sedated EEG is abnormal due to the presence of: 1. Moderate diffuse slowing of the background 2. Additional focal slowing over the left centrotemporal region Clinical Correlation of the above findings indicates diffuse cerebral dysfunction that is non-specific in etiology and can be seen with hypoxic/ischemic injury, toxic/metabolic encephalopathies, or medication effect from Propofol. Additional focal slowing over the left centrotemporal region indicates focal cerebral dysfunction in this region suggestive of underlying structural or physiologic abnormality. There were no electrographic seizures in this study.  The absence of epileptiform discharges does not rule out a clinical diagnosis of epilepsy.  Clinical correlation is advised.  LE venous Doppler There is evidence of acute deep vein thrombosis involving the peroneal veins of the right lower extremity.  There is no evidence of superficial vein thrombosis involving the right lower extremity. There is no evidence of deep or superficial vein thrombosis involving the left lower extremity. There is no evidence of a Baker's cyst bilaterally.  Carotid Doppler Findings are consistent with a 1-39 percent stenosis involving the right internal carotid artery and the left internal carotid artery. The vertebral arteries demonstrate antegrade flow.   TEE Left ventricle: The cavity size was normal. Wall thickness was   increased in a pattern of mild LVH. Systolic function was normal.   The estimated ejection fraction was in the range of 60% to 65%.   Wall motion was normal; there were no regional wall motion   abnormalities. Features are consistent with a pseudonormal left   ventricular filling pattern, with concomitant abnormal relaxation   and increased filling pressure (grade 2 diastolic dysfunction). - Aortic valve:  There was mild regurgitation. Valve area (VTI):   1.76 cm^2. Valve area (Vmax): 2.02 cm^2. Valve area (Vmean): 1.95 cm^2. - Mitral valve: Valve area by pressure half-time: 2.47 cm^2.   PHYSICAL EXAM  Temp:  [97.9 F (36.6 C)-100.8 F (38.2 C)] 98.6 F (37 C) (02/17 0400) Pulse Rate:  [68-97] 85 (02/17 0700) Resp:  [16-33] 24 (02/17 0700) BP: (101-149)/(38-80) 141/63 (02/17 0700) SpO2:  [100 %] 100 % (02/17 0700) FiO2 (%):  [30 %-40 %] 30 % (02/17 0339)  General - cachectic, well developed, intubated on sedation. No changes in exam today, stable.  Ophthalmologic - Fundi not visualized due to left gaze.  Cardiovascular - Regular rate and rhythm.  Neuro - intubated on sedation, does not open eye to sternal rub or pain, left gaze deviation does not cross midline, does not blink to threat, pupil 2.5 mm bilaterally, sluggish to light. Corneal positive and gag cough positive. Facial symmetry difficult to assess due to ET tube. On pain stimulation, mild withdrawal in all 4 extremities. DTR diminished, toes equivocal. Sensation, coronation and gait not tested.   ASSESSMENT/PLAN Ms. SADEE RUPPE is a 76 y.o. female with history of colon cancer, metastatic pancreatobiliary adenocarcinoma, HTN, HLD and DM as long as they have seventh and the others. No murmurs, IME and hospital has website blocks. Hospital has website blocks; cannot see presenting with progressive altered mental state,  HA and double vision. She did not receive IV t-PA due to delay in arrival.   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 Infarct embolic likely secondary to hypercoagulable state from metastatic colon cancer.  Resultant intubated on sedation  CT head subacute infarct R parietal lobe, no frank hematoma   MRI  extensive multi vascular supra and infratentorial infarcts. Right petechial  hemorrhage.  MRA  basilar artery and R PCA occlusion, I suspect this is chronic changes due to lack of brainstem and PCA ischemia.   Cerebral angiogram TICI 3 revascularization of occluded basilar artery  2D Echo  EF 60-65%   Carotid Doppler unremarkable   LE venous doppler  - right popliteal vein DVT  EEG diffuse slowing with superimposed left centrotemporal slowing   LDL 110  HgbA1c - 7.2  Lovenox 40 mg sq daily  for VTE prophylaxis Diet NPO time specified  No antithrombotic prior to admission, now on aspirin 325 mg daily.   Ongoing aggressive stroke risk factor management  Therapy recommendations:  pending  Disposition:  Pending  Acute respiratory failure   Intubated for airway protection   CCM on board  We are sedation as able  Right LE DVT - likely due to hypercoagulable state secondary to metastatic colon cancer  Not candidate for anticoagulation due to large infarct with petechial hemorrhage  Will discuss with family regarding no aggressive treatment vs. 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/16  Likely the source of hypercoagulable state  Hypertension  Stable  Long-term BP goal normotensive  Hyperlipidemia  Home meds:  No statin  LDL 110, goal < 70  No statin due to elevated LFTs and liver metastasis  Diabetes  HgbA1c - 7.2  goal < 7.0  On Lantus  SSI  CBG monitoring  Other Stroke Risk Factors  Advanced age  Other Active Problems  Elevated troponins - trending down, likely demand ischemia  Arthritis  Anemia - hemoglobin stable  Hospital day # 2  This patient is critically ill due to extensive embolic stroke s/p mechanical thrombectomy for the last rate occlusion and at significant risk of neurological worsening, death  form recurrent stroke, hemorrhagic transformation, seizure, cerebral edema, pulmonary emboli. This patient's care requires constant monitoring of vital signs, hemodynamics, respiratory and cardiac monitoring, review of multiple databases, neurological assessment, discussion with family, other specialists and medical decision making of high complexity. I spent 35 minutes of neurocritical care time in the care of this patient.   Personally examined patient and images, and have participated in and made any corrections needed to history, physical, neuro exam,assessment and plan as stated above.  I have personally obtained the history, evaluated lab date, reviewed imaging studies and agree with radiology interpretations.    Sarina Ill, MD Stroke Neurology [facility]    To contact Stroke Continuity provider, please refer to [email or web address] After hours, contact General Neurology

## 2017-01-11 NOTE — Progress Notes (Signed)
PULMONARY / CRITICAL CARE MEDICINE   Name: April Franklin MRN: [id number] DOB: [birth year]    ADMISSION DATE:  01/09/2017 CONSULTATION DATE:  01/10/2017  REFERRING MD:  Dr. Leonel Ramsay Neurology  CHIEF COMPLAINT:  CVA  HISTORY OF PRESENT ILLNESS:   76 year old female with PMH as below, which is significant for DM, colon cancer s/p resection, HTN, and HLD. The patient had a liver biopsy of hepatic masses that were noted on MRCP (12/09/2016). The biopsy was done on 12/30/2016. Since that time she has started to decline complaining of headaches, fatigue, and confusion which markedly worsened 2/13. She was admitted to [facility] where CT head demonstrated subacute infarct. MRI then demonstrated extensive areas of infarct including emergent basilar artery and R PCA occlusion. She was taken to IR and underwent revascularization. She returned to ICU on ventilator PCCM asked to see.    SUBJECTIVE:  Remains unresponsive except to pain on vent  Family at bedside, spoke with daughter.  Family discussion with neuro yest regarding RLE DVT -tx on hold for now as pt to decide on aggressive care vs comfort care due extensive stroke/DVT /metastatic cancer.    VITAL SIGNS: BP (!) 141/63   Pulse 85   Temp 99.8 F (37.7 C) (Axillary)   Resp (!) 24   Ht 5\' 2"  (1.575 m)   Wt 133 lb 13.1 oz (60.7 kg)   SpO2 100%   BMI 24.48 kg/m   HEMODYNAMICS:    VENTILATOR SETTINGS: Vent Mode: PRVC FiO2 (%):  [30 %-40 %] 30 % Set Rate:  [18 bmp] 18 bmp Vt Set:  [400 mL] 400 mL PEEP:  [5 cmH20] 5 cmH20 Plateau Pressure:  [10 cmH20-12 cmH20] 12 cmH20  INTAKE / OUTPUT: I/O last 3 completed shifts: In: 3405.7 [I.V.:3405.7] Out: 2265 [id number]; Emesis/NG output:350; Blood:150]  PHYSICAL EXAMINATION: General:  Elderly female in NAD on vent Neuro: unresponsive, not following commands.  HEENT:  ETT  Cardiovascular:  RRR, no MRG Lungs:  Clear bilateral breath sounds Abdomen:  Soft, non-distended, OGT to  sxn Musculoskeletal:  No acute deformity Skin:  Grossly intact  LABS:  BMET  Recent Labs Lab 01/09/17 1356 01/10/17 0526 01/11/17 0301  NA 137 143 143  K 3.5 3.6 3.5  CL 104 105 107  CO2 25 24 24   BUN 15 9 9   CREATININE 0.83 0.86 0.76  GLUCOSE 120* 154* 185*    Electrolytes  Recent Labs Lab 01/09/17 1356 01/10/17 0526 01/11/17 0301  CALCIUM 9.7 10.0 10.1  MG  --   --  1.5*  PHOS  --   --  3.1    CBC  Recent Labs Lab 01/09/17 1356 01/10/17 0526  WBC 5.9 7.2  HGB 10.6* 10.6*  HCT 31.2* 31.8*  PLT 127* 152    Coag's  Recent Labs Lab 01/10/17 0103  APTT 27  INR 1.28    Sepsis Markers  Recent Labs Lab 01/09/17 1410 01/09/17 1646  LATICACIDVEN 0.96 1.29    ABG  Recent Labs Lab 01/10/17 0610  PHART 7.348*  PCO2ART 41.1  PO2ART 157*    Liver Enzymes  Recent Labs Lab 01/09/17 1356 01/10/17 0104  AST 87* 79*  ALT 118* 117*  ALKPHOS 337* 341*  BILITOT 1.3* 1.1  ALBUMIN 3.3* 3.4*    Cardiac Enzymes  Recent Labs Lab 01/10/17 0526 01/10/17 1117 01/10/17 1951  TROPONINI 1.24* 0.91* 0.92*    Glucose  Recent Labs Lab 01/10/17 1159 01/10/17 1621 01/10/17 2024 01/10/17 2323 01/11/17 0309 01/11/17 0756  GLUCAP 201* 163* 173* 169* 162* 163*    Imaging Dg Chest Port 1 View  Result Date: 01/11/2017 CLINICAL DATA:  Initial evaluation for ventilator management. EXAM: PORTABLE CHEST 1 VIEW COMPARISON:  Prior radiograph from 01/10/2017. FINDINGS: Patient is intubated with the tip of an endotracheal tube positioned 2.5 cm above the carina. Enteric tube courses in the the abdomen. Cardiac and mediastinal silhouettes are stable, and remain within normal limits. Aortic atherosclerosis noted. Lungs are hypoinflated with elevation left hemidiaphragm, stable. No focal infiltrates, pulmonary edema, or pleural effusion. No pneumothorax. Osseous structures unchanged. IMPRESSION: 1. Endotracheal tube in satisfactory position with the tip  positioned 2.5 cm above the carina. 2. No radiographic evidence for acute cardiopulmonary abnormality. Electronically Signed   By: Jeannine Boga M.D.   On: 01/11/2017 04:52     STUDIES:  Liver biopsy 2/5 > metastatic adenocarcinoma CT head 2/15> Subacute infarct of the posterosuperior right parietal lobe with expected evolution since the CT of 01/04/2017. No hemorrhage or mass effect. MRI could be obtained for further characterization. MRI/MRA brain 2/15 > Acute extensive multi vascular territory supra and infratentorial infarcts. RIGHT petechial hemorrhage without hemorrhagic conversion. 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 Reconstitution of basilar tip and LEFT posterior cerebral artery via LEFT posterior communicating artery. 2/16 Ven Doppler >+DVT RLE (peroneal )  2/16 EEG >Mod diffuse slowing   CULTURES:   ANTIBIOTICS:   SIGNIFICANT EVENTS:2/16 s/p basilar artery revascularization   LINES/TUBES: ETT 2/16 >  ASSESSMENT / PLAN:  PULMONARY A: Inability to protect airway s/p neuro IR procedure  P:   Full vent support Eval for daily SBT/Wean  VAP bundle CXR in am   CARDIOVASCULAR A:  H/o HTN  P:  Cleviprex for BP goal per neurology Holding home PO Cozaar   RENAL A:   Hypomagnesium   P:   Follow BMP Replace Mg   GASTROINTESTINAL A:   Metastatic adenocarcinoma to liver  P:   NPO, if not extubated will need TF started today /tomorrow.  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

## 2017-01-11 NOTE — Progress Notes (Signed)
OT Cancellation Note  Patient Details Name: SAYAKA TERRES MRN: [id number] DOB: [birth year]   Cancelled Treatment:    Reason Eval/Treat Not Completed: Patient not medically ready.  [place], OTR/L [id number]   Lucille Passy M 01/11/2017, 2:26 PM

## 2017-01-11 NOTE — Progress Notes (Addendum)
[facility] Attending Note  Presenting HPI:  76 y.o. female with known history of diabetes mellitus, colon cancer postresection, hypertension, and hyperlipidemia. Status post liver biopsy of hepatic masses with MRCP on 12/30/16. Since biopsy patient has progressively declined complaining of headaches, fatigue, and confusion which markedly worsened on 2/13. Patient was found to have a subacute infarct on CT of the head upon admission. MRI demonstrated extensive areas of infarct emerging from the basilar artery and right posterior communicating artery with occlusion. Patient underwent revascularization in interventional radiology and return to the ICU chemically ventilated.  Subjective:  No acute events overnight. Ongoing family discussions regarding goals of care. Patient did have a temperature up to 100.8 today.  Review of Systems:  Unable to obtain given intubation & altered mental status.   Vent Mode: PRVC FiO2 (%):  [30 %] 30 % Set Rate:  [18 bmp] 18 bmp Vt Set:  [400 mL] 400 mL PEEP:  [5 cmH20] 5 cmH20 Pressure Support:  [12 cmH20] 12 cmH20 Plateau Pressure:  [11 cmH20-14 cmH20] 14 cmH20  Temp:  [98.6 F (37 C)-100.1 F (37.8 C)] 99.4 F (37.4 C) (02/17 1600) Pulse Rate:  [68-94] 88 (02/17 1930) Resp:  [15-27] 23 (02/17 1930) BP: (101-149)/(38-78) 147/67 (02/17 1930) SpO2:  [100 %] 100 % (02/17 1930) FiO2 (%):  [30 %] 30 % (02/17 1921)  General:  Husband and daughter at bedside. Intubated. No distress. Integument:  Warm & dry. No rash on exposed skin. HEENT:  Moist mucus memebranes. No scleral icterus. Endotracheal tube in place. Neurological:  Pupils symmetric. No Babinski. No spontaneous movements. Mildly roving eye movements.  Musculoskeletal:  No joint effusion or erythema appreciated. Symmetric muscle bulk. Pulmonary:  Symmetric chest wall rise on ventilator. Clear breath sounds bilaterally. Cardiovascular:  Regular rate & rhythm. No appreciable JVD.  Normal S1 & S2. Telemetry:  Sinus rhythm. Abdomen:  Soft. Nondistended. Hypoactive bowel sounds.  LINES/TUBES: OETT 7.5 2/16 >>> Foley 2/16 >>> OGT 2/16 >>> PIV  CBC Latest Ref Rng & Units 01/10/2017 01/09/2017 01/04/2017  WBC 4.0 - 10.5 K/uL 7.2 5.9 5.1  Hemoglobin 12.0 - 15.0 g/dL 10.6(L) 10.6(L) 10.4(L)  Hematocrit 36.0 - 46.0 % 31.8(L) 31.2(L) 30.7(L)  Platelets 150 - 400 K/uL 152 127(L) 146(L)    BMP Latest Ref Rng & Units 01/11/2017 01/10/2017 01/09/2017  Glucose 65 - 99 mg/dL 185(H) 154(H) 120(H)  BUN 6 - 20 mg/dL 9 9 15   Creatinine 0.44 - 1.00 mg/dL 0.76 0.86 0.83  Sodium 135 - 145 mmol/L 143 143 137  Potassium 3.5 - 5.1 mmol/L 3.5 3.6 3.5  Chloride 101 - 111 mmol/L 107 105 104  CO2 22 - 32 mmol/L 24 24 25   Calcium 8.9 - 10.3 mg/dL 10.1 10.0 9.7   Hepatic Function Latest Ref Rng & Units 01/10/2017 01/09/2017 01/04/2017  Total Protein 6.5 - 8.1 g/dL 6.5 6.2(L) 6.0(L)  Albumin 3.5 - 5.0 g/dL 3.4(L) 3.3(L) 3.5  AST 15 - 41 U/L 79(H) 87(H) 119(H)  ALT 14 - 54 U/L 117(H) 118(H) 152(H)  Alk Phosphatase 38 - 126 U/L 341(H) 337(H) 329(H)  Total Bilirubin 0.3 - 1.2 mg/dL 1.1 1.3(H) 1.6(H)  Bilirubin, Direct 0.1 - 0.5 mg/dL 0.3 - -    IMAGING/STUDIES: MRI/MRA HEAD 2/15: IMPRESSION: MRI HEAD: Acute extensive multi vascular territory supra and infratentorial infarcts. RIGHT petechial hemorrhage without hemorrhagic conversion.  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 Reconstitution of basilar tip and LEFT posterior cerebral artery via LEFT  posterior communicating artery.  CT HEAD W/O 2/16:  Evolving multifocal infarcts with worsening RIGHT cerebellar cytotoxic edema partially effacing the fourth ventricle. No obstructive hydrocephalus. Contrast staining and/or petechial hemorrhage RIGHT parieto-occipital lobe and RIGHT cerebellar infarcts without frank hemorrhagic conversion.  BILATERAL LE VENOUS DUPLEX 2/16: Acute DVT involving the peroneal  veins of the right lower extremity. No evidence of superficial vein thrombosis of the right lower extremity. No evidence of deep or superficial vein thrombosis of the left lower extremity.  PORT CXR 2/17:  Personally reviewed by me.   MICROBIOLOGY: Urine Culture 2/10:  Multiple species present MRSA PCR 2/16:  Negative   ANTIBIOTICS: None.  SIGNIFICANT EVENTS: 02/05 - MRCP with hepatic mass biopsies 02/15 - Admit 02/16 - basilar artery revascularization in interventional radiology  ASSESSMENT/PLAN:  76 y.o.  female with acute respiratory failure post basilar artery revascularization on 2/16. Patient with hepatic masses and biopsy from 2/5 consistent with metastatic adenocarcinoma. Patient is not waking up despite withholding sedation. Given no response I am continuing Foley catheter to monitor urine output for accurate assessment of volume status.  1. Acute respiratory failure: Unable to protect airway post revascularization by interventional radiology. Continuing full ventilator support. Daily spontaneous breathing trial and pressure support weaning. 2. 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 Management per neurology and interventional radiology moving forward. 3. Acute encephalopathy: Continuing to withhold sedation. Fentanyl IV as needed for any signs of pain or discomfort. Family educated on length of action and pharmacology of propofol. 4. Right lower extremity DVT: Peroneal veins/calf vein. Seen on 2/16 ultrasound. Holding on systemic anticoagulation given risk for hemorrhagic conversion with acute CVA and low risk of embolization. 5. Metastatic adenocarcinoma of the liver: Discovered on MRCP biopsy 2/5. Further outpatient workup pending survival. 6. Anemia: No signs of active bleeding. Continuing to trend hemoglobin daily with CBC.  7. Diabetes mellitus: Holding home glipizide and metformin. Glucose controlled. Continuing accu-checks q4hr and  sliding scale insulin per sensitive algorithm.  8. Hypomagnesemia:  Replaced IV. Repeat magnesium level in the morning.   Prophylaxis: SCDs, Lovenox subcutaneous daily, & Protonix IV daily. Diet: Nothing by mouth. Holding on tube feedings pending decision on extubation and goals of care. Code Status: Currently full code with ongoing [place] discussions. Disposition: Remains in the ICU while ongoing discussions regarding goals of care continue and patient is endotracheally intubated. Family Update: Husband and daughter updated at bedside extensively on 2/17.  I have personally spent a total of 42 minutes of critical care time today caring for the patient, updating family at bedside, & reviewing the aptient's electronic medical record.  Sonia Baller Ashok Cordia, M.D. [facility] Pager:  [phone] After 3pm or if no response, call [phone] 8:03 PM 01/11/17

## 2017-01-11 NOTE — Progress Notes (Signed)
Neurology MD clarified BP goal remains SYS 120-140.

## 2017-01-11 NOTE — Progress Notes (Signed)
PT Cancellation Note  Patient Details Name: April Franklin MRN: [id number] DOB: August 26, 1941   Cancelled Treatment:    Reason Eval/Treat Not Completed: Medical issues which prohibited therapy RN deferred as pt still on bedrest and awaiting for decision on medical plan, comfort care, palliative, or treatment for multiple DVTS and clots. Acute PT to return as able, as appropriate.   Townsend Cudworth M Kyelle Urbas 01/11/2017, 11:44 AM   Kittie Plater, PT, DPT Pager #: [phone] Office #: [phone]

## 2017-01-11 NOTE — Progress Notes (Signed)
Elink MD called with low AVG UO . Similar to previous nights UO per chart. No new orders at this time.

## 2017-01-12 ENCOUNTER — Inpatient Hospital Stay (HOSPITAL_COMMUNITY): Payer: Medicare Other

## 2017-01-12 DIAGNOSIS — I6310 Cerebral infarction due to embolism of unspecified precerebral artery: Secondary | ICD-10-CM

## 2017-01-12 LAB — CBC
HCT: 29.4 % — ABNORMAL LOW (ref 36.0–46.0)
HEMOGLOBIN: 9.5 g/dL — AB (ref 12.0–15.0)
MCH: 27.9 pg (ref 26.0–34.0)
MCHC: 32.3 g/dL (ref 30.0–36.0)
MCV: 86.5 fL (ref 78.0–100.0)
PLATELETS: 156 10*3/uL (ref 150–400)
RBC: 3.4 MIL/uL — AB (ref 3.87–5.11)
RDW: 14.2 % (ref 11.5–15.5)
WBC: 7.3 10*3/uL (ref 4.0–10.5)

## 2017-01-12 LAB — MAGNESIUM
MAGNESIUM: 2.1 mg/dL (ref 1.7–2.4)
MAGNESIUM: 1.9 mg/dL (ref 1.7–2.4)
Magnesium: 1.9 mg/dL (ref 1.7–2.4)

## 2017-01-12 LAB — RENAL FUNCTION PANEL
Albumin: 2.9 g/dL — ABNORMAL LOW (ref 3.5–5.0)
Anion gap: 7 (ref 5–15)
BUN: 8 mg/dL (ref 6–20)
CALCIUM: 10.3 mg/dL (ref 8.9–10.3)
CO2: 27 mmol/L (ref 22–32)
CREATININE: 0.71 mg/dL (ref 0.44–1.00)
Chloride: 110 mmol/L (ref 101–111)
GFR calc non Af Amer: >60 mL/min (ref >60)
Glucose, Bld: 170 mg/dL — ABNORMAL HIGH (ref 65–99)
Phosphorus: 2.5 mg/dL (ref 2.5–4.6)
Potassium: 4 mmol/L (ref 3.5–5.1)
SODIUM: 144 mmol/L (ref 135–145)

## 2017-01-12 LAB — GLUCOSE, CAPILLARY
GLUCOSE-CAPILLARY: 159 mg/dL — AB (ref 65–99)
GLUCOSE-CAPILLARY: 180 mg/dL — AB (ref 65–99)
Glucose-Capillary: 121 mg/dL — ABNORMAL HIGH (ref 65–99)
Glucose-Capillary: 197 mg/dL — ABNORMAL HIGH (ref 65–99)
Glucose-Capillary: 188 mg/dL — ABNORMAL HIGH (ref 65–99)
Glucose-Capillary: 219 mg/dL — ABNORMAL HIGH (ref 65–99)

## 2017-01-12 LAB — PHOSPHORUS
PHOSPHORUS: 2.7 mg/dL (ref 2.5–4.6)
Phosphorus: 2.7 mg/dL (ref 2.5–4.6)

## 2017-01-12 MED ORDER — VITAL HIGH PROTEIN PO LIQD: 1000.0000 mL | ORAL | Status: DC

## 2017-01-12 MED ORDER — LABETALOL HCL 5 MG/ML IV SOLN
10.0000 mg | Freq: Once | INTRAVENOUS | Status: AC
  Administered 2017-01-12: 10 mg via INTRAVENOUS
  Filled 2017-01-12: qty 4

## 2017-01-12 MED ORDER — VITAL AF 1.2 CAL PO LIQD
1000.0000 mL | ORAL | Status: DC
  Administered 2017-01-12: 1000 mL

## 2017-01-12 MED ORDER — PRO-STAT SUGAR FREE PO LIQD
30.0000 mL | Freq: Two times a day (BID) | ORAL | Status: DC
  Administered 2017-01-12 – 2017-01-13 (×3): 30 mL
  Filled 2017-01-12 (×3): qty 30

## 2017-01-12 NOTE — Plan of Care (Signed)
  Interdisciplinary Goals of Care Family Meeting   Date carried out:: 01/12/2017  Location of the meeting: Bedside  Member's involved: Physician, Bedside Registered Nurse and Family Member or next of kin  Durable Power of Attorney or acting medical decision maker: Children.    Discussion: We discussed goals of care for April Franklin.  Lengthy discussion with the patient's children and family at bedside regarding current clinical state and lack of obvious neurologic recovery. Explained that at this time I do not feel this is due to residual drug effect. Reviewed their understanding of the prognosis as explained to them by neurology. Discussed the potential treatment goals and options moving forward including full palliation with terminal vent wean and 18 with extubation and use of morphine for palliation of symptoms as well as tracheostomy and PEG tube placement for further rehabilitation or simply to maintain the patient in her current clinical state of coma. Family present all seem to agree that a tracheostomy and long-term care in a skilled nursing facility would not be what the patient would want. They have determined that Tuesday is the day that they would like to make a decision as a family regarding ongoing treatment and goals of care. I repeatedly explained the processes of not only tracheostomy and PEG tube placement as well as their goals of supporting the patient with intended further improvement and rehabilitation potential.  Code status: Full Code  Disposition: Continue current acute care  Time spent for the meeting: 37 minutes   April Franklin 01/12/2017, 11:00 PM

## 2017-01-12 NOTE — Progress Notes (Signed)
PT Cancellation Note  Patient Details Name: April Franklin MRN: [id number] DOB: July 19, 1941   Cancelled Treatment:    Reason Eval/Treat Not Completed: Patient not medically ready. Pt now on vent. Family still discussing medical plan direction, aggressive treatment, palliative, or comfort. PT going to cancel consult. Please re-consult if acute PT services are needed in future. Thank you!   Avanna Sowder M Anastashia Westerfeld 01/12/2017, 6:45 AM   Kittie Plater, PT, DPT Pager #: [phone] Office #: [phone]

## 2017-01-12 NOTE — Progress Notes (Signed)
[facility] Attending Note  Presenting HPI:  76 y.o. female with known history of diabetes mellitus, colon cancer postresection, hypertension, and hyperlipidemia. Status post liver biopsy of hepatic masses with MRCP on 12/30/16. Since biopsy patient has progressively declined complaining of headaches, fatigue, and confusion which markedly worsened on 2/13. Patient was found to have a subacute infarct on CT of the head upon admission. MRI demonstrated extensive areas of infarct emerging from the basilar artery and right posterior communicating artery with occlusion. Patient underwent revascularization in interventional radiology and return to the ICU chemically ventilated.  Subjective:  There were no acute events overnight. Patient continues to be comatose and has no interaction with family.  Review of Systems:  Unable to obtain given intubation & altered mental status.   Vent Mode: PRVC FiO2 (%):  [30 %] 30 % Set Rate:  [18 bmp] 18 bmp Vt Set:  [400 mL] 400 mL PEEP:  [5 cmH20] 5 cmH20 Pressure Support:  [10 cmH20] 10 cmH20 Plateau Pressure:  [14 cmH20-17 cmH20] 17 cmH20  Temp:  [98 F (36.7 C)-100.1 F (37.8 C)] 98.5 F (36.9 C) (02/18 2000) Pulse Rate:  [66-91] 80 (02/18 2200) Resp:  [18-24] 21 (02/18 2200) BP: (124-166)/(57-76) 166/67 (02/18 2200) SpO2:  [100 %] 100 % (02/18 2200) FiO2 (%):  [30 %] 30 % (02/18 1936)  General:  Multiple family members at bedside. No distress. Patient remains on ventilator. Integument:  Warm & dry. No rash on exposed skin. No bruising. Extremities:  No cyanosis or clubbing.  HEENT:  No oral ulcers. No scleral injection or icterus. Endotracheal tube in place. Cardiovascular:  Regular rate. No edema. No appreciable JVD.  Pulmonary:  Overall clear to auscultation bilaterally. symmetric chest wall rise on ventilator. Abdomen: Soft. Normal bowel sounds. Nondistended.  Musculoskeletal:  Normal bulk. Unable to further assess given her  altered mentation. Neurological: Pupils symmetric. Questionable withdrawal versus triple flexion in bilateral lower extremities to pain and plantar reflex. No spontaneous movements. Does not follow commands. Eyes constantly closed.  LINES/TUBES: OETT 7.5 2/16 >>> Foley 2/16 >>> OGT 2/16 >>> PIV  CBC Latest Ref Rng & Units 01/12/2017 01/10/2017 01/09/2017  WBC 4.0 - 10.5 K/uL 7.3 7.2 5.9  Hemoglobin 12.0 - 15.0 g/dL 9.5(L) 10.6(L) 10.6(L)  Hematocrit 36.0 - 46.0 % 29.4(L) 31.8(L) 31.2(L)  Platelets 150 - 400 K/uL 156 152 127(L)    BMP Latest Ref Rng & Units 01/12/2017 01/11/2017 01/10/2017  Glucose 65 - 99 mg/dL 170(H) 185(H) 154(H)  BUN 6 - 20 mg/dL 8 9 9   Creatinine 0.44 - 1.00 mg/dL 0.71 0.76 0.86  Sodium 135 - 145 mmol/L 144 143 143  Potassium 3.5 - 5.1 mmol/L 4.0 3.5 3.6  Chloride 101 - 111 mmol/L 110 107 105  CO2 22 - 32 mmol/L 27 24 24   Calcium 8.9 - 10.3 mg/dL 10.3 10.1 10.0   Hepatic Function Latest Ref Rng & Units 01/12/2017 01/10/2017 01/09/2017  Total Protein 6.5 - 8.1 g/dL - 6.5 6.2(L)  Albumin 3.5 - 5.0 g/dL 2.9(L) 3.4(L) 3.3(L)  AST 15 - 41 U/L - 79(H) 87(H)  ALT 14 - 54 U/L - 117(H) 118(H)  Alk Phosphatase 38 - 126 U/L - 341(H) 337(H)  Total Bilirubin 0.3 - 1.2 mg/dL - 1.1 1.3(H)  Bilirubin, Direct 0.1 - 0.5 mg/dL - 0.3 -    IMAGING/STUDIES: MRI/MRA HEAD 2/15: IMPRESSION: MRI HEAD: Acute extensive multi vascular territory supra and infratentorial infarcts. RIGHT petechial hemorrhage without hemorrhagic conversion. MRA HEAD: Emergent basilar  artery and RIGHT posterior cerebral artery occlusion. Reconstitution of basilar tip and LEFT posterior cerebral artery via LEFT posterior communicating artery. CT HEAD W/O 2/16:  Evolving multifocal infarcts with worsening RIGHT cerebellar cytotoxic edema partially effacing the fourth ventricle. No obstructive hydrocephalus. Contrast staining and/or petechial hemorrhage RIGHT parieto-occipital lobe and RIGHT cerebellar infarcts  without frank hemorrhagic conversion. BILATERAL LE VENOUS DUPLEX 2/16: Acute DVT involving the peroneal veins of the right lower extremity. No evidence of superficial vein thrombosis of the right lower extremity. No evidence of deep or superficial vein thrombosis of the left lower extremity. PORT CXR 2/18:  Personally reviewed by me. No focal opacity or mass appreciated. No pleural effusion. Elevation of left hemidiaphragm. Endotracheal tube in good position. Enteric feeding tube coursing below diaphragm.  MICROBIOLOGY: Urine Culture 2/10:  Multiple species present MRSA PCR 2/16:  Negative   ANTIBIOTICS: None.  SIGNIFICANT EVENTS: 02/05 - MRCP with hepatic mass biopsies 02/15 - Admit 02/16 - basilar artery revascularization in interventional radiology  ASSESSMENT/PLAN:  76 y.o.  female with acute respiratory failure unable to protect her airway post basilar artery occlusion and revascularization on 2/16. Patient also with hepatic metastases consistent with adenocarcinoma on biopsy from 2/5. Patient's mental status and neurologic recovery seems stagnant and essentially nonexistent. Family are hopeful that there could be a "miracle" but also realistic.   1. Acute respiratory failure: Unable to protect airway. Lengthy discussion with the family at bedside regarding the purpose behind tracheostomy placement. Continuing pressure support wean. Holding on extubation until goals of care are solidified. 2. 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 Management per primary service. 3. Acute encephalopathy/comatose status: Continuing to hold sedation. Continuing fentanyl IV for any signs of pain or discomfort. 4. Right lower extremity calf vein DVT: Present with peroneal vein. Seen on 2/60 ultrasound. Holding on systemic anticoagulation given risk of hemorrhagic conversion of stroke and low risk of embolization. 5. Metastatic adenocarcinoma: Metastases to the liver.  Discovered on MRCP biopsy 2/5. Further workup pending survival. 6. Anemia: Still no signs of bleeding. Continuing to trend hemoglobin daily with CBC. 7. Diabetes mellitus: Glucose control. Continuing to hold home metformin and glipizide. Continuing Accu-Cheks every 4 hours and sliding scale insulin per sensitive algorithm. 8. Hypomagnesemia: Resolved.   Prophylaxis: SCDs, Lovenox subcutaneous daily, & Protonix IV daily. Diet: Nothing by mouth. Initiating tube feedings per dietary recommendations. Code Status: Currently full code. Disposition: Remains in the ICU while ongoing discussions regarding goals of care continue and patient is endotracheally intubated. Family Update: Multiple family members updated at bedside by myself during rounds. Please refer to the plan of care note documented elsewhere. Plan for family discussion and decision regarding goals of care and tracheostomy/PEG tube placement on Tuesday.  I have personally spent a total of 31 minutes of critical care time today caring for the patient & reviewing the aptient's electronic medical record.  Sonia Baller Ashok Cordia, M.D. [facility] Pager:  [phone] After 3pm or if no response, call [phone] 11:05 PM 01/12/17

## 2017-01-12 NOTE — Progress Notes (Signed)
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  Medications: Prior to Admission medications   Medication Sig Start Date End Date Taking? Authorizing Provider  cephALEXin (KEFLEX) 500 MG capsule Take 1 capsule (500 mg total) by mouth 2 (two) times daily. 01/04/17  Yes Kristen N Ward, DO  glipiZIDE (GLUCOTROL) 10 MG tablet Take 10 mg by mouth daily.   Yes Historical Provider, MD  Insulin Degludec (TRESIBA FLEXTOUCH) 200 UNIT/ML SOPN Inject 20 Units into the muscle every evening. After dinner 11/07/16  Yes Historical Provider, MD  linagliptin (TRADJENTA) 5 MG TABS tablet Take 5 mg by mouth daily.   Yes Historical Provider, MD  losartan (COZAAR) 50 MG tablet Take 50 mg by mouth daily.   Yes Historical Provider, MD  metFORMIN (GLUCOPHAGE) 1000 MG tablet Take 1,000 mg by mouth daily with breakfast.   Yes Historical Provider, MD  traMADol (ULTRAM) 50 MG tablet Take 50 mg by mouth every 6 (six) hours as needed for moderate pain.   Yes Historical Provider, MD     Vital Signs: BP (!) 147/58   Pulse 81   Temp 98 F (36.7 C) (Axillary)   Resp 20   Ht 5\' 2"  (1.575 m)   Wt 133 lb 13.1 oz (60.7 kg)   SpO2 100%   BMI 24.48 kg/m   Physical Exam  Musculoskeletal:  No movement  Neurological:  No response  Skin: Skin is warm.  Rt groin clean and dry nt No bleeding    Imaging: Ct Head Wo Contrast  Result Date: 01/10/2017 CLINICAL DATA:  Follow-up stroke. Status post revascularization of occluded basilar artery. History of hypertension, diabetes, colon cancer. EXAM: CT HEAD WITHOUT CONTRAST TECHNIQUE: Contiguous axial images were obtained from the base of the skull through the vertex without intravenous contrast. COMPARISON:  CT HEAD and MRI head January 09, 2015  FINDINGS: BRAIN: Evolving large RIGHT cerebellar infarct with faint central density. Partially effaced fourth ventricle, increased from prior imaging. Small LEFT cerebellar infarcts. Evolving RIGHT parietal occipital lobe infarcts with faint density. Additional scattered small infarcts as seen on yesterday's MRI. No lobar hematoma. Ventricles and sulci are overall normal for patient's age. No abnormal extra-axial fluid collections. Basal cisterns are patent. VASCULAR: Moderate calcific atherosclerosis of the carotid siphons. SKULL: No skull fracture. No significant scalp soft tissue swelling. SINUSES/ORBITS: The mastoid air-cells and included paranasal sinuses are well-aerated.The included ocular globes and orbital contents are non-suspicious. OTHER: None. IMPRESSION: Evolving multifocal infarcts with worsening RIGHT cerebellar cytotoxic edema partially effacing the fourth ventricle. No obstructive hydrocephalus. Contrast staining and/or petechial hemorrhage RIGHT parieto-occipital lobe and RIGHT cerebellar infarcts without frank hemorrhagic conversion. Electronically Signed   By: Elon Alas M.D.   On: 01/10/2017 04:34   Ct Head Wo Contrast  Result Date: 01/09/2017 CLINICAL DATA:  Altered mental status and headache EXAM: CT HEAD WITHOUT CONTRAST TECHNIQUE: Contiguous axial images were obtained from the base of the skull through the vertex without intravenous contrast. COMPARISON:  Head CT 01/04/2017 FINDINGS: Brain: There is a new focal area of hypoattenuation at the cortex of the right parietal lobe with loss of gray-white differentiation, consistent with acute infarct. No midline shift or mass effect. No extra-axial collection. No hemorrhage. There is periventricular hypoattenuation compatible with chronic microvascular disease. There is bilateral basal  ganglia mineralization. Vascular: Atherosclerotic calcification of the vertebral and internal carotid arteries at the skull base. Skull: Normal  visualized skull base, calvarium and extracranial soft tissues. Sinuses/Orbits: No sinus fluid levels or advanced mucosal thickening. No mastoid effusion. Normal orbits. IMPRESSION: 1. Subacute infarct of the posterosuperior right parietal lobe with expected evolution since the CT of 01/04/2017. No hemorrhage or mass effect. MRI could be obtained for further characterization. 2. Chronic microvascular ischemia. These results were called by telephone at the time of interpretation on 01/09/2017 at 2:34 pm to Dr. Quintella Reichert , who verbally acknowledged these results. Electronically Signed   By: Ulyses Jarred M.D.   On: 01/09/2017 14:37   Mr [name] Head Wo Contrast  Result Date: 01/10/2017 CLINICAL DATA:  Progressive altered mental status and confusion for several weeks. Headache. History of colon cancer, hypertension, diabetes. EXAM: MRI HEAD WITHOUT CONTRAST MRA HEAD WITHOUT CONTRAST TECHNIQUE: Multiplanar, multiecho pulse sequences of the brain and surrounding structures were obtained without intravenous contrast. Angiographic images of the head were obtained using MRA technique without contrast. COMPARISON:  CT HEAD January 09, 2017 at 1419 hours FINDINGS: MRI HEAD FINDINGS BRAIN: Patchy reduced diffusion RIGHT greater than LEFT (multi vascular territory) cerebellum. Scattered subcentimeter foci of reduced diffusion midbrain, LEFT temporal lobe and RIGHT basal ganglia. Confluent RIGHT greater than LEFT occipital lobe reduced diffusion and, RIGHT mesial temporal lobe. Patchy reduced diffusion predominately within white matter bilateral frontoparietal lobes. Low ADC values in T2 bright signal within all areas of reduced diffusion consistent with similar age. Punctate susceptibility artifact LEFT thalamus. Petechial hemorrhage RIGHT parietal and occipital lobes, to lesser extent RIGHT cerebellum. Ventricles and sulci are overall normal for patient's age. No midline shift. No masses. No abnormal extra-axial fluid  collections. VASCULAR: T2 bright signal within the basilar artery consistent with thromboembolism. SKULL AND UPPER CERVICAL SPINE: No abnormal sellar expansion. No suspicious calvarial bone marrow signal. Craniocervical junction maintained. SINUSES/ORBITS: The mastoid air-cells and included paranasal sinuses are well-aerated. Status post RIGHT ocular lens implant. The included ocular globes and orbital contents are non-suspicious. OTHER: None. MRA HEAD FINDINGS ANTERIOR CIRCULATION: Normal flow related enhancement of the included cervical, petrous, cavernous and supraclinoid internal carotid arteries. Patent anterior communicating artery. Normal flow related enhancement of the anterior and middle cerebral arteries, including distal segments. No large vessel occlusion, high-grade stenosis, abnormal luminal irregularity, aneurysm. POSTERIOR CIRCULATION: RIGHT vertebral artery is dominant. Loss of basilar artery flow related enhancement. Reconstitution via LEFT posterior communicating artery. Occluded RIGHT posterior cerebral artery from the origin. No collaterals identified which may reflect time-of-flight technique. No high-grade stenosis, abnormal luminal irregularity, aneurysm. ANATOMIC VARIANTS: None. IMPRESSION: MRI HEAD: Acute extensive multi vascular territory supra and infratentorial infarcts. RIGHT petechial hemorrhage without hemorrhagic conversion. 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 Reconstitution of basilar tip and LEFT posterior cerebral artery via LEFT posterior communicating artery. Acute findings discussed with and reconfirmed by Dr.MCNEILL [facility] on 01/09/2017 at 11:59 pm. Electronically Signed   By: Elon Alas M.D.   On: 01/10/2017 00:03   Mr Brain Wo Contrast  Result Date: 01/10/2017 CLINICAL DATA:  Progressive altered mental status and confusion for several weeks. Headache. History of colon cancer, hypertension, diabetes. EXAM: MRI HEAD  WITHOUT CONTRAST MRA HEAD WITHOUT CONTRAST TECHNIQUE: Multiplanar, multiecho pulse sequences of the brain and surrounding structures were obtained without intravenous contrast. Angiographic images of the head were obtained using MRA technique without contrast. COMPARISON:  CT HEAD January 09, 2017 at 1419 hours FINDINGS: MRI  HEAD FINDINGS BRAIN: Patchy reduced diffusion RIGHT greater than LEFT (multi vascular territory) cerebellum. Scattered subcentimeter foci of reduced diffusion midbrain, LEFT temporal lobe and RIGHT basal ganglia. Confluent RIGHT greater than LEFT occipital lobe reduced diffusion and, RIGHT mesial temporal lobe. Patchy reduced diffusion predominately within white matter bilateral frontoparietal lobes. Low ADC values in T2 bright signal within all areas of reduced diffusion consistent with similar age. Punctate susceptibility artifact LEFT thalamus. Petechial hemorrhage RIGHT parietal and occipital lobes, to lesser extent RIGHT cerebellum. Ventricles and sulci are overall normal for patient's age. No midline shift. No masses. No abnormal extra-axial fluid collections. VASCULAR: T2 bright signal within the basilar artery consistent with thromboembolism. SKULL AND UPPER CERVICAL SPINE: No abnormal sellar expansion. No suspicious calvarial bone marrow signal. Craniocervical junction maintained. SINUSES/ORBITS: The mastoid air-cells and included paranasal sinuses are well-aerated. Status post RIGHT ocular lens implant. The included ocular globes and orbital contents are non-suspicious. OTHER: None. MRA HEAD FINDINGS ANTERIOR CIRCULATION: Normal flow related enhancement of the included cervical, petrous, cavernous and supraclinoid internal carotid arteries. Patent anterior communicating artery. Normal flow related enhancement of the anterior and middle cerebral arteries, including distal segments. No large vessel occlusion, high-grade stenosis, abnormal luminal irregularity, aneurysm. POSTERIOR  CIRCULATION: RIGHT vertebral artery is dominant. Loss of basilar artery flow related enhancement. Reconstitution via LEFT posterior communicating artery. Occluded RIGHT posterior cerebral artery from the origin. No collaterals identified which may reflect time-of-flight technique. No high-grade stenosis, abnormal luminal irregularity, aneurysm. ANATOMIC VARIANTS: None. IMPRESSION: MRI HEAD: Acute extensive multi vascular territory supra and infratentorial infarcts. RIGHT petechial hemorrhage without hemorrhagic conversion. 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 Reconstitution of basilar tip and LEFT posterior cerebral artery via LEFT posterior communicating artery. Acute findings discussed with and reconfirmed by Dr.MCNEILL [facility] on 01/09/2017 at 11:59 pm. Electronically Signed   By: Elon Alas M.D.   On: 01/10/2017 00:03   Dg Chest Port 1 View  Result Date: 01/12/2017 CLINICAL DATA:  Respiratory failure EXAM: PORTABLE CHEST 1 VIEW COMPARISON:  yesterday FINDINGS: Endotracheal tube tip is just below the clavicular heads, stable. Orogastric tube reaches the stomach at least. There is no edema, consolidation, effusion, or pneumothorax. No cardiomegaly when accounting for technical factors. IMPRESSION: 1. Stable good positioning of endotracheal and orogastric tubes. 2. No evidence for active cardiopulmonary disease. Electronically Signed   By: Monte Fantasia M.D.   On: 01/12/2017 06:54   Dg Chest Port 1 View  Result Date: 01/11/2017 CLINICAL DATA:  Initial evaluation for ventilator management. EXAM: PORTABLE CHEST 1 VIEW COMPARISON:  Prior radiograph from 01/10/2017. FINDINGS: Patient is intubated with the tip of an endotracheal tube positioned 2.5 cm above the carina. Enteric tube courses in the the abdomen. Cardiac and mediastinal silhouettes are stable, and remain within normal limits. Aortic atherosclerosis noted. Lungs are hypoinflated with elevation  left hemidiaphragm, stable. No focal infiltrates, pulmonary edema, or pleural effusion. No pneumothorax. Osseous structures unchanged. IMPRESSION: 1. Endotracheal tube in satisfactory position with the tip positioned 2.5 cm above the carina. 2. No radiographic evidence for acute cardiopulmonary abnormality. Electronically Signed   By: Jeannine Boga M.D.   On: 01/11/2017 04:52   Portable Chest Xray  Result Date: 01/10/2017 CLINICAL DATA:  Endotracheal tube placement after IR procedure. EXAM: PORTABLE CHEST 1 VIEW COMPARISON:  Chest radiograph August 24, 2010 FINDINGS: Endotracheal tube tip projects 2.2 cm above the carina. Nasogastric tube past proximal stomach, distal tip out of field-of-view. Cardiomediastinal silhouette is normal. No pleural effusions  or focal consolidations. Trachea projects midline and there is no pneumothorax. Soft tissue planes and included osseous structures are non-suspicious. IMPRESSION: Endotracheal tube tip projects 2.2 cm above the carina. Nasogastric tube past proximal stomach. No acute cardiopulmonary process. Electronically Signed   By: Elon Alas M.D.   On: 01/10/2017 05:33    Labs:  CBC:  Recent Labs  01/04/17 0353 01/09/17 1356 01/10/17 0526 01/12/17 0244  WBC 5.1 5.9 7.2 7.3  HGB 10.4* 10.6* 10.6* 9.5*  HCT 30.7* 31.2* 31.8* 29.4*  PLT 146* 127* 152 156    COAGS:  Recent Labs  01/10/17 0103  INR 1.28  APTT 27    BMP:  Recent Labs  01/09/17 1356 01/10/17 0526 01/11/17 0301 01/12/17 0244  NA 137 143 143 144  K 3.5 3.6 3.5 4.0  CL 104 105 107 110  CO2 25 24 24 27   GLUCOSE 120* 154* 185* 170*  BUN 15 9 9 8   CALCIUM 9.7 10.0 10.1 10.3  CREATININE 0.83 0.86 0.76 0.71  GFRNONAA >60 >60 >60 >60  GFRAA >60 >60 >60 >60    LIVER FUNCTION TESTS:  Recent Labs  01/04/17 0353 01/09/17 1356 01/10/17 0104 01/12/17 0244  BILITOT 1.6* 1.3* 1.1  --   AST 119* 87* 79*  --   ALT 152* 118* 117*  --   ALKPHOS 329* 337* 341*   --   PROT 6.0* 6.2* 6.5  --   ALBUMIN 3.5 3.3* 3.4* 2.9*    Assessment and Plan:  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

## 2017-01-12 NOTE — Progress Notes (Signed)
PULMONARY / CRITICAL CARE MEDICINE   Name: April Franklin MRN: [id number] DOB: Apr 22, 1941    ADMISSION DATE:  01/09/2017 CONSULTATION DATE:  01/10/2017  REFERRING MD:  Dr. Leonel Ramsay Neurology  CHIEF COMPLAINT:  CVA  HISTORY OF PRESENT ILLNESS:   76 year old female with PMH as below, which is significant for DM, colon cancer s/p resection, HTN, and HLD. The patient had a liver biopsy of hepatic masses that were noted on MRCP (12/09/2016). The biopsy was done on 12/30/2016. Since that time she has started to decline complaining of headaches, fatigue, and confusion which markedly worsened 2/13. She was admitted to [facility] where CT head demonstrated subacute infarct. MRI then demonstrated extensive areas of infarct including emergent basilar artery and R PCA occlusion. She was taken to IR and underwent revascularization. She returned to ICU on ventilator PCCM asked to see.    SUBJECTIVE:  Unresponsive on vent , sedation off.  Weaning on vent    VITAL SIGNS: BP (!) 140/59   Pulse 75   Temp 98 F (36.7 C) (Axillary)   Resp 19   Ht 5\' 2"  (1.575 m)   Wt 133 lb 13.1 oz (60.7 kg)   SpO2 100%   BMI 24.48 kg/m   HEMODYNAMICS:    VENTILATOR SETTINGS: Vent Mode: PSV;CPAP FiO2 (%):  [30 %] 30 % Set Rate:  [18 bmp] 18 bmp Vt Set:  [400 mL] 400 mL PEEP:  [5 cmH20] 5 cmH20 Pressure Support:  [10 cmH20-12 cmH20] 10 cmH20 Plateau Pressure:  [14 cmH20-15 cmH20] 15 cmH20  INTAKE / OUTPUT: I/O last 3 completed shifts: In: 4907.6 [I.V.:4857.6; IV Piggyback:50] Out: [id number] [Urine:765; Emesis/NG output:650]  PHYSICAL EXAMINATION: General:  Elderly female in NAD on vent Neuro: unresponsive, not following commands.  HEENT:  ETT  Cardiovascular:  RRR, no MRG Lungs:  Clear bilateral breath sounds Abdomen:  Soft, non-distended, OGT  Musculoskeletal:  No acute deformity Skin:  Grossly intact  LABS:  BMET  Recent Labs Lab 01/10/17 0526 01/11/17 0301 01/12/17 0244  NA 143 143  144  K 3.6 3.5 4.0  CL 105 107 110  CO2 24 24 27   BUN 9 9 8   CREATININE 0.86 0.76 0.71  GLUCOSE 154* 185* 170*    Electrolytes  Recent Labs Lab 01/10/17 0526 01/11/17 0301 01/12/17 0244  CALCIUM 10.0 10.1 10.3  MG  --  1.5* 2.1  PHOS  --  3.1 2.5    CBC  Recent Labs Lab 01/09/17 1356 01/10/17 0526 01/12/17 0244  WBC 5.9 7.2 7.3  HGB 10.6* 10.6* 9.5*  HCT 31.2* 31.8* 29.4*  PLT 127* 152 156    Coag's  Recent Labs Lab 01/10/17 0103  APTT 27  INR 1.28    Sepsis Markers  Recent Labs Lab 01/09/17 1410 01/09/17 1646  LATICACIDVEN 0.96 1.29    ABG  Recent Labs Lab 01/10/17 0610  PHART 7.348*  PCO2ART 41.1  PO2ART 157*    Liver Enzymes  Recent Labs Lab 01/09/17 1356 01/10/17 0104 01/12/17 0244  AST 87* 79*  --   ALT 118* 117*  --   ALKPHOS 337* 341*  --   BILITOT 1.3* 1.1  --   ALBUMIN 3.3* 3.4* 2.9*    Cardiac Enzymes  Recent Labs Lab 01/10/17 0526 01/10/17 1117 01/10/17 1951  TROPONINI 1.24* 0.91* 0.92*    Glucose  Recent Labs Lab 01/11/17 1146 01/11/17 1543 01/11/17 1944 01/11/17 2355 01/12/17 0340 01/12/17 0747  GLUCAP 131* 155* 142* 164* 159* 180*  Imaging Dg Chest Port 1 View  Result Date: 01/12/2017 CLINICAL DATA:  Respiratory failure EXAM: PORTABLE CHEST 1 VIEW COMPARISON:  yesterday FINDINGS: Endotracheal tube tip is just below the clavicular heads, stable. Orogastric tube reaches the stomach at least. There is no edema, consolidation, effusion, or pneumothorax. No cardiomegaly when accounting for technical factors. IMPRESSION: 1. Stable good positioning of endotracheal and orogastric tubes. 2. No evidence for active cardiopulmonary disease. Electronically Signed   By: Monte Fantasia M.D.   On: 01/12/2017 06:54     STUDIES:  Liver biopsy 2/5 > metastatic adenocarcinoma CT head 2/15> Subacute infarct of the posterosuperior right parietal lobe with expected evolution since the CT of 01/04/2017. No hemorrhage  or mass effect. MRI could be obtained for further characterization. MRI/MRA brain 2/15 > Acute extensive multi vascular territory supra and infratentorial infarcts. RIGHT petechial hemorrhage without hemorrhagic conversion. 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 Reconstitution of basilar tip and LEFT posterior cerebral artery via LEFT posterior communicating artery. 2/16 Ven Doppler >+DVT RLE (peroneal )  2/16 EEG >Mod diffuse slowing   CULTURES:   ANTIBIOTICS:   SIGNIFICANT EVENTS:2/16 s/p basilar artery revascularization   LINES/TUBES: ETT 2/16 >  ASSESSMENT / PLAN:  PULMONARY A: Acute Resp Failure w/ Inability to protect airway s/p neuro IR procedure  P:   Full vent support Eval for daily SBT/Wean  VAP bundle    CARDIOVASCULAR A:  H/o HTN Troponin bump ? 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

## 2017-01-12 NOTE — Progress Notes (Signed)
STROKE TEAM PROGRESS NOTE   HISTORY OF PRESENT ILLNESS (per record) April Franklin is a 76 y.o. female with a history of colon cancer who has been having progressive altered mental status and confusion over the past several weeks. It was only mild until 2 days ago at which point it started getting progressively worse, then this morning when she was checked on, she did not arouse appropriately and therefore she was brought into the [facility] emergency department. She also has been complaining of headaches over the past few weeks as well. On CT, she has a area of hypodensity in the right parietal region consistent with a subacute infarct. She also has been complaining of double vision. She was last known well several days ago. Patient was not administered IV t-PA secondary to being outside of the window. MRA revealed basilar occlusion. Given continued progression of neurologic worsening, intervention was considered reasonable. She was sent to interventional radiology where she had TICI3 revascularization of an occluded basilar artery with one pass of the trevo and 2 passes of the solitaire as well as intra-arterial Integrilin  She was admitted to the neuro ICU for further evaluation and treatment.   SUBJECTIVE (INTERVAL HISTORY) Family at bedside considering comfort care or palliative. Discussed she should be on Palliative care, prognosis is very poor. They would like a family meeting on Tuesday. No change in patient, she is non responsive and not sedated.   OBJECTIVE Temp:  [98.1 F (36.7 C)-100 F (37.8 C)] 98.1 F (36.7 C) (02/18 0400) Pulse Rate:  [72-113] 77 (02/18 0630) Cardiac Rhythm: Normal sinus rhythm (02/17 2000) Resp:  [15-25] 20 (02/18 0630) BP: (108-166)/(56-78) 151/67 (02/18 0630) SpO2:  [100 %] 100 % (02/18 0630) FiO2 (%):  [30 %] 30 % (02/18 0357)  CBC:   Recent Labs Lab 01/09/17 1356 01/10/17 0526 01/12/17 0244  WBC 5.9 7.2 7.3  NEUTROABS 4.0 5.5  --   HGB 10.6* 10.6* 9.5*   HCT 31.2* 31.8* 29.4*  MCV 83.6 84.4 86.5  PLT 127* 152 [id number]    Basic Metabolic Panel:   Recent Labs Lab 01/11/17 0301 01/12/17 0244  NA 143 144  K 3.5 4.0  CL 107 110  CO2 24 27  GLUCOSE 185* 170*  BUN 9 8  CREATININE 0.76 0.71  CALCIUM 10.1 10.3  MG 1.5* 2.1  PHOS 3.1 2.5    Lipid Panel:     Component Value Date/Time   CHOL 194 01/10/2017 0104   TRIG 102 01/10/2017 0526   HDL 67 01/10/2017 0104   CHOLHDL 2.9 01/10/2017 0104   VLDL 17 01/10/2017 0104   LDLCALC 110 (H) 01/10/2017 0104   HgbA1c:  Lab Results  Component Value Date   HGBA1C 7.2 (H) 01/10/2017   Urine Drug Screen: No results found for: LABOPIA, COCAINSCRNUR, LABBENZ, AMPHETMU, THCU, LABBARB    IMAGING I have personally reviewed the radiological images below and agree with the radiology interpretations.  Ct Head Wo Contrast 01/09/2017 1. Subacute infarct of the posterosuperior right parietal lobe with expected evolution since the CT of 01/04/2017. No hemorrhage or mass effect. MRI could be obtained for further characterization. 2. Chronic microvascular ischemia.   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 Reconstitution of basilar tip and LEFT posterior cerebral artery via LEFT posterior communicating artery.   Mr Brain Wo Contrast 01/10/2017 Acute extensive multi vascular territory supra and infratentorial infarcts. RIGHT petechial hemorrhage without hemorrhagic conversion.   Cerebral angiogram 01/10/2017  S/P bilateral veretbral artery angiograms followed by endovascular complete revascularization of occluded basilar artery X 1pass with trevoprovue 54mm x30 mm retrieval device ,and x 2 passes with solitaire FR 2mm x 40 mm devise achieving a TICI 3 reperfusion. Also used 10.4 mg of IA superselective Integrelin  Ct Head Wo Contrast 01/10/2017 Evolving multifocal infarcts with worsening RIGHT cerebellar cytotoxic edema partially effacing  the fourth ventricle. No obstructive hydrocephalus. Contrast staining and/or petechial hemorrhage RIGHT parieto-occipital lobe and RIGHT cerebellar infarcts without frank hemorrhagic conversion.   EEG -  This sedated EEG is abnormal due to the presence of: 1. Moderate diffuse slowing of the background 2. Additional focal slowing over the left centrotemporal region Clinical Correlation of the above findings indicates diffuse cerebral dysfunction that is non-specific in etiology and can be seen with hypoxic/ischemic injury, toxic/metabolic encephalopathies, or medication effect from Propofol. Additional focal slowing over the left centrotemporal region indicates focal cerebral dysfunction in this region suggestive of underlying structural or physiologic abnormality. There were no electrographic seizures in this study.  The absence of epileptiform discharges does not rule out a clinical diagnosis of epilepsy.  Clinical correlation is advised.  LE venous Doppler There is evidence of acute deep vein thrombosis involving the peroneal veins of the right lower extremity.  There is no evidence of superficial vein thrombosis involving the right lower extremity. There is no evidence of deep or superficial vein thrombosis involving the left lower extremity. There is no evidence of a Baker's cyst bilaterally.  Carotid Doppler Findings are consistent with a 1-39 percent stenosis involving the right internal carotid artery and the left internal carotid artery. The vertebral arteries demonstrate antegrade flow.   TEE Left ventricle: The cavity size was normal. Wall thickness was   increased in a pattern of mild LVH. Systolic function was normal.   The estimated ejection fraction was in the range of 60% to 65%.   Wall motion was normal; there were no regional wall motion   abnormalities. Features are consistent with a pseudonormal left   ventricular filling pattern, with concomitant abnormal relaxation   and  increased filling pressure (grade 2 diastolic dysfunction). - Aortic valve: There was mild regurgitation. Valve area (VTI):   1.76 cm^2. Valve area (Vmax): 2.02 cm^2. Valve area (Vmean): 1.95 cm^2. - Mitral valve: Valve area by pressure half-time: 2.47 cm^2.   PHYSICAL EXAM  Temp:  [98.1 F (36.7 C)-100 F (37.8 C)] 98.1 F (36.7 C) (02/18 0400) Pulse Rate:  [72-113] 77 (02/18 0630) Resp:  [15-25] 20 (02/18 0630) BP: (108-166)/(56-78) 151/67 (02/18 0630) SpO2:  [100 %] 100 % (02/18 0630) FiO2 (%):  [30 %] 30 % (02/18 0357)  No changes on exam today.   General - cachectic, intubated not on sedation. No changes in exam today, stable.  Ophthalmologic - Fundi not visualized due to left gaze.  Cardiovascular - Regular rate and rhythm.  Neuro - intubated no on sedation, does not open eye to sternal rub or pain, left gaze deviation does not cross midline, does not blink to threat, pupil 2.0 mm bilaterally, sluggish to light. Corneal positive and gag cough positive. Facial symmetry difficult to assess due to ET tube. Mild withdrawal in all 4 extremities to pain. DTR diminished, toes equivocal. Sensation, coordination and gait not tested.   ASSESSMENT/PLAN April Franklin is a 76 y.o. female with history of colon cancer, metastatic pancreatobiliary adenocarcinoma, HTN, HLD and DM presenting with progressive altered mental state, HA and double vision. She did not  receive IV t-PA due to delay in arrival.   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 Infarct embolic likely secondary to hypercoagulable state from metastatic colon cancer.  Resultant intubated not on sedation, unresponsive  CT head subacute infarct R parietal lobe, no frank hematoma   MRI  extensive multi vascular supra and infratentorial infarcts. Right petechial hemorrhage.  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   Cerebral angiogram TICI 3 revascularization of occluded basilar artery  2D Echo  EF 60-65%   Carotid Doppler unremarkable   LE venous doppler  - right popliteal vein DVT  EEG diffuse slowing with superimposed left centrotemporal slowing   LDL 110  HgbA1c - 7.2  Lovenox 40 mg sq daily  for VTE prophylaxis Diet NPO time specified  No antithrombotic prior to admission, now on aspirin 325 mg daily.   Ongoing aggressive stroke risk factor management  Therapy recommendations:  pending  Disposition:  Pending  Acute respiratory failure   Intubated for airway protection   CCM on board  We are sedation as able  Right LE DVT - likely due to hypercoagulable state secondary to metastatic colon cancer  Not candidate for anticoagulation due to large infarct with petechial hemorrhage  Discussed with family regarding no aggressive treatment vs. IVC filter. They would like a family meeting Tuesday to discuss. I recommended palliative care.   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 s/p liver bx 12/30/16  Likely the source of hypercoagulable state  Hypertension  Stable  Long-term BP goal normotensive  Hyperlipidemia  Home meds:  No statin  LDL 110, goal < 70  No statin due to elevated LFTs and liver metastasis  Diabetes  HgbA1c - 7.2  goal < 7.0  On Lantus  SSI  CBG monitoring  Other Stroke Risk Factors  Advanced age  Other Active Problems  Elevated troponins - trending down, likely demand ischemia  Arthritis  Anemia - hemoglobin stable  Hospital day # 3  This patient is critically ill due to extensive embolic stroke s/p mechanical thrombectomy for the last rate occlusion and at  significant risk of neurological worsening, death form recurrent stroke, hemorrhagic transformation, seizure, cerebral edema, pulmonary emboli. This patient's care requires constant monitoring of vital signs, hemodynamics, respiratory and cardiac monitoring, review of multiple databases, neurological assessment, discussion with family, other specialists and medical decision making of high complexity. I spent 30 minutes of neurocritical care time in the care of this patient.   Personally examined patient and images, and have participated in and made any corrections needed to history, physical, neuro exam,assessment and plan as stated above.  I have personally obtained the history, evaluated lab date, reviewed imaging studies and agree with radiology interpretations.    Sarina Ill, MD Stroke Neurology [facility]   To contact Stroke Continuity provider, please refer to [email or web address] After hours, contact General Neurology

## 2017-01-13 ENCOUNTER — Encounter (HOSPITAL_COMMUNITY): Payer: Self-pay | Admitting: Radiology

## 2017-01-13 DIAGNOSIS — J9620 Acute and chronic respiratory failure, unspecified whether with hypoxia or hypercapnia: Secondary | ICD-10-CM | POA: Diagnosis not present

## 2017-01-13 DIAGNOSIS — J9600 Acute respiratory failure, unspecified whether with hypoxia or hypercapnia: Secondary | ICD-10-CM

## 2017-01-13 LAB — CBC
HEMATOCRIT: 27.2 % — AB (ref 36.0–46.0)
HEMOGLOBIN: 8.8 g/dL — AB (ref 12.0–15.0)
MCH: 28.3 pg (ref 26.0–34.0)
MCHC: 32.4 g/dL (ref 30.0–36.0)
MCV: 87.5 fL (ref 78.0–100.0)
Platelets: 148 10*3/uL — ABNORMAL LOW (ref 150–400)
RBC: 3.11 MIL/uL — AB (ref 3.87–5.11)
RDW: 14.1 % (ref 11.5–15.5)
WBC: 5.9 10*3/uL (ref 4.0–10.5)

## 2017-01-13 LAB — PHOSPHORUS
PHOSPHORUS: 2.5 mg/dL (ref 2.5–4.6)
Phosphorus: 1.9 mg/dL — ABNORMAL LOW (ref 2.5–4.6)

## 2017-01-13 LAB — GLUCOSE, CAPILLARY
GLUCOSE-CAPILLARY: 256 mg/dL — AB (ref 65–99)
GLUCOSE-CAPILLARY: 200 mg/dL — AB (ref 65–99)
GLUCOSE-CAPILLARY: 260 mg/dL — AB (ref 65–99)
Glucose-Capillary: 214 mg/dL — ABNORMAL HIGH (ref 65–99)
Glucose-Capillary: 188 mg/dL — ABNORMAL HIGH (ref 65–99)
Glucose-Capillary: 264 mg/dL — ABNORMAL HIGH (ref 65–99)

## 2017-01-13 LAB — BASIC METABOLIC PANEL
Anion gap: 10 (ref 5–15)
BUN: 14 mg/dL (ref 6–20)
CHLORIDE: 111 mmol/L (ref 101–111)
CO2: 26 mmol/L (ref 22–32)
Calcium: 10.5 mg/dL — ABNORMAL HIGH (ref 8.9–10.3)
Creatinine, Ser: 0.58 mg/dL (ref 0.44–1.00)
GFR calc non Af Amer: >60 mL/min (ref >60)
GLUCOSE: 192 mg/dL — AB (ref 65–99)
POTASSIUM: 4 mmol/L (ref 3.5–5.1)
Sodium: 147 mmol/L — ABNORMAL HIGH (ref 135–145)

## 2017-01-13 LAB — TRIGLYCERIDES: Triglycerides: 103 mg/dL (ref <150)

## 2017-01-13 LAB — MAGNESIUM
Magnesium: 1.8 mg/dL (ref 1.7–2.4)
Magnesium: 1.7 mg/dL (ref 1.7–2.4)

## 2017-01-13 MED ORDER — PRO-STAT SUGAR FREE PO LIQD
30.0000 mL | Freq: Every day | ORAL | Status: DC
  Administered 2017-01-13 – 2017-01-14 (×6): 30 mL
  Filled 2017-01-13 (×6): qty 30

## 2017-01-13 MED ORDER — INSULIN GLARGINE 100 UNIT/ML ~~LOC~~ SOLN
10.0000 [IU] | Freq: Every day | SUBCUTANEOUS | Status: DC
  Administered 2017-01-13: 10 [IU] via SUBCUTANEOUS
  Filled 2017-01-13: qty 0.1

## 2017-01-13 MED ORDER — POTASSIUM CHLORIDE IN NACL 20-0.9 MEQ/L-% IV SOLN
INTRAVENOUS | Status: DC
  Administered 2017-01-13 – 2017-01-20 (×3): via INTRAVENOUS
  Filled 2017-01-13 (×7): qty 1000

## 2017-01-13 MED ORDER — VITAL AF 1.2 CAL PO LIQD
1000.0000 mL | ORAL | Status: DC
  Administered 2017-01-13: 1000 mL

## 2017-01-13 MED ORDER — SENNOSIDES-DOCUSATE SODIUM 8.6-50 MG PO TABS
1.0000 | ORAL_TABLET | Freq: Every day | ORAL | Status: DC
  Administered 2017-01-13 – 2017-01-17 (×5): 1 via ORAL
  Filled 2017-01-13 (×6): qty 1

## 2017-01-13 NOTE — Progress Notes (Signed)
[facility]  Presenting HPI:  76 y.o. female with known history of diabetes mellitus, colon cancer postresection, hypertension, and hyperlipidemia. Status post liver biopsy of hepatic masses with MRCP on 12/30/16. Since biopsy patient has progressively declined complaining of headaches, fatigue, and confusion which markedly worsened on 2/13. Patient was found to have a subacute infarct on CT of the head upon admission. MRI demonstrated extensive areas of infarct emerging from the basilar artery and right posterior communicating artery with occlusion. Patient underwent revascularization in interventional radiology and return to the ICU chemically ventilated.  Subjective:  No acute events overnight, remains comatose off sedation.  Objective:  Vent Mode: CPAP;PSV FiO2 (%):  [30 %] 30 % Set Rate:  [18 bmp] 18 bmp Vt Set:  [400 mL] 400 mL PEEP:  [5 cmH20] 5 cmH20 Pressure Support:  [5 cmH20-10 cmH20] 5 cmH20 Plateau Pressure:  [13 cmH20-17 cmH20] 13 cmH20  Temp:  [98.1 F (36.7 C)-100.1 F (37.8 C)] 98.7 F (37.1 C) (02/19 0800) Pulse Rate:  [66-87] 76 (02/19 0900) Resp:  [18-24] 23 (02/19 0900) BP: (119-166)/(51-76) 142/61 (02/19 0900) SpO2:  [100 %] 100 % (02/19 0900) FiO2 (%):  [30 %] 30 % (02/19 0855) Weight:  [67.4 kg (148 lb 9.4 oz)] 67.4 kg (148 lb 9.4 oz) (02/19 0416)  General:  Elderly female in NAD on vent with family members at bedside Neuro:  Comatose on vent. Cough/gag intact, flexion to pain lower extremities only.  HEENT:  [place]/AT, No JVD noted Cardiovascular:  RRR, no MRG Lungs:  Clear bilateral breath sounds Abdomen:  Soft, non-distended Musculoskeletal:  No acute deformity Skin:  Intact, MMM    LINES/TUBES: OETT 7.5 2/16 >>> Foley 2/16 >>> OGT 2/16 >>> PIV  CBC Latest Ref Rng & Units 01/13/2017 01/12/2017 01/10/2017  WBC 4.0 - 10.5 K/uL 5.9 7.3 7.2  Hemoglobin 12.0 - 15.0 g/dL 8.8(L) 9.5(L) 10.6(L)  Hematocrit 36.0 - 46.0 % 27.2(L) 29.4(L)  31.8(L)  Platelets 150 - 400 K/uL 148(L) 156 152    BMP Latest Ref Rng & Units 01/13/2017 01/12/2017 01/11/2017  Glucose 65 - 99 mg/dL 192(H) 170(H) 185(H)  BUN 6 - 20 mg/dL _0 Creatinine 0.44 - 1.00 mg/dL 0.58 0.71 0.76  Sodium 135 - 145 mmol/L 147(H) 144 143  Potassium 3.5 - 5.1 mmol/L 4.0 4.0 3.5  Chloride 101 - 111 mmol/L 111 110 107  CO2 22 - 32 mmol/L _1 Calcium 8.9 - 10.3 mg/dL 10.5(H) 10.3 10.1   Hepatic Function Latest Ref Rng & Units 01/12/2017 01/10/2017 01/09/2017  Total Protein 6.5 - 8.1 g/dL - 6.5 6.2(L)  Albumin 3.5 - 5.0 g/dL 2.9(L) 3.4(L) 3.3(L)  AST 15 - 41 U/L - 79(H) 87(H)  ALT 14 - 54 U/L - 117(H) 118(H)  Alk Phosphatase 38 - 126 U/L - 341(H) 337(H)  Total Bilirubin 0.3 - 1.2 mg/dL - 1.1 1.3(H)  Bilirubin, Direct 0.1 - 0.5 mg/dL - 0.3 -    IMAGING/STUDIES: MRI/MRA HEAD 2/15: IMPRESSION: MRI HEAD: Acute extensive multi vascular territory supra and infratentorial infarcts. RIGHT petechial hemorrhage without hemorrhagic conversion. 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 Reconstitution of basilar tip and LEFT posterior cerebral artery via LEFT posterior communicating artery. CT HEAD W/O 2/16:  Evolving multifocal infarcts with worsening RIGHT cerebellar cytotoxic edema partially effacing the fourth ventricle. No obstructive hydrocephalus. Contrast staining and/or petechial hemorrhage RIGHT parieto-occipital lobe and RIGHT cerebellar infarcts without frank hemorrhagic conversion. BILATERAL LE VENOUS DUPLEX  2/16: Acute DVT involving the peroneal veins of the right lower extremity. No evidence of superficial vein thrombosis of the right lower extremity. No evidence of deep or superficial vein thrombosis of the left lower extremity. PORT CXR 2/18:  Personally reviewed by me. No focal opacity or mass appreciated. No pleural effusion. Elevation of left hemidiaphragm. Endotracheal tube in good position. Enteric feeding tube  coursing below diaphragm.  MICROBIOLOGY: Urine Culture 2/10:  Multiple species present MRSA PCR 2/16:  Negative   ANTIBIOTICS: None.  SIGNIFICANT EVENTS: 02/05 - MRCP with hepatic mass biopsies 02/15 - Admit 02/16 - basilar artery revascularization in interventional radiology  ASSESSMENT/PLAN:  76 y.o.  female with acute respiratory failure unable to protect her airway post basilar artery occlusion and revascularization on 2/16. Patient also with hepatic metastases consistent with adenocarcinoma on biopsy from 2/5. Patient's mental status and neurologic recovery seems stagnant and essentially nonexistent. Family understands this, but are praying for a miracle. Will make more concrete decisions on next steps 2/20  1. Acute respiratory failure: weaning well, but neurologic status prevents extubation.  2. Acute CVA secondary to basilar artery occlusion: S/p IR revascularization, per stroke team. 3. Acute encephalopathy/comatose status: PRN fentanyl for pain. Hold sedating meds as much as possible. 4. Right lower extremity calf vein DVT: Present with peroneal vein. Seen on 2/6 ultrasound. Holding on systemic anticoagulation given risk of hemorrhagic conversion of stroke and low risk of embolization. 5. Metastatic adenocarcinoma: Metastases to the liver. Discovered on MRCP biopsy 2/5. Further workup pending survival. 6. Anemia: No signs of bleeing. Trend hemoglobin daily with CBC. 7. Diabetes mellitus: Glucose control. Continuing to hold home metformin and glipizide. Continuing Accu-Cheks every 4 hours and sliding scale insulin per sensitive algorithm. Will increase Lantus to 10units daily.  Prophylaxis: SCDs, Lovenox subcutaneous daily, & Protonix IV daily. Diet: NPO. Tube feeds per dietary Code Status: Currently full code. Disposition: ICU Family Update: Further discussed plan of care given very limited neurological recovery thus far. Will continue with original plan of waiting until  tomorrow to discuss trach vs comfort.   APP critical care time 35 mins  Georgann Housekeeper, AGACNP-BC [facility] Pulmonology/Critical Care Pager [phone] or [phone]  01/13/2017 10:05 AM

## 2017-01-13 NOTE — Progress Notes (Signed)
Nutrition Follow-up  DOCUMENTATION CODES:   Severe malnutrition in context of acute illness/injury  INTERVENTION:   Decrease Vital AF 1.2 to 20 ml/hr Increase Prostat to 30 ml five times per day Provides: 1076 kcal, 111 grams protein, and 389 ml free water.  TF regimen and propofol at current rate providing 1364 total kcal/day (98 % of kcal needs)  NUTRITION DIAGNOSIS:   Malnutrition (Severe) related to acute illness as evidenced by energy intake < or equal to 50% for > or equal to 5 days, severe depletion of muscle mass, percent weight loss. Ongoing.   GOAL:   Patient will meet greater than or equal to 90% of their needs Met.   MONITOR:   I & O's, Vent status, Labs  REASON FOR ASSESSMENT:   Consult Enteral/tube feeding initiation and management  ASSESSMENT:   Pt withn PMH of DM, HTN, colon cancer s/p resection 2009. 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 She underwent IR intervention with revascularization and is now in ICU on vent.   Patient is currently intubated on ventilator support MV: 6.6 L/min Temp (24hrs), Avg:98.8 F (37.1 C), Min:98.1 F (36.7 C), Max:100.1 F (37.8 C)  2/18 Adult TF protocol initiated Per MD to discuss trach/PEG or comfort on Tuesday CBG's: [phone] PO4 WNL  Remains on Cleviprex @ 6 ml/hr = 288 kcal   Diet Order:  Diet NPO time specified  Skin:  Reviewed, no issues  Last BM:  unknown  Height:   Ht Readings from Last 1 Encounters:  01/10/17 5' 2"  (1.575 m)    Weight:   Wt Readings from Last 1 Encounters:  01/13/17 148 lb 9.4 oz (67.4 kg)    Ideal Body Weight:  50 kg  BMI:  Body mass index is 27.18 kg/m.  Estimated Nutritional Needs:   Kcal:  1392  Protein:  90-110  grams  Fluid:  > 1.5 L/day  EDUCATION NEEDS:   No education needs identified at this time  [place], [place], [place] Pager [phone] After Hours Pager

## 2017-01-13 NOTE — Progress Notes (Signed)
STROKE TEAM PROGRESS NOTE   SUBJECTIVE (INTERVAL HISTORY) Daughter at bedside. Shared pathology with Dr. [name] along with family support (husband, total of 5 children of which she is 1).   OBJECTIVE Temp:  [98.1 F (36.7 C)-100.1 F (37.8 C)] 98.7 F (37.1 C) (02/19 0800) Pulse Rate:  [66-92] 76 (02/19 1030) Cardiac Rhythm: Normal sinus rhythm (02/19 0756) Resp:  [18-24] 21 (02/19 1030) BP: (119-166)/(51-76) 132/57 (02/19 1030) SpO2:  [100 %] 100 % (02/19 1030) FiO2 (%):  [30 %] 30 % (02/19 0855) Weight:  [67.4 kg (148 lb 9.4 oz)] 67.4 kg (148 lb 9.4 oz) (02/19 0416)  CBC:   Recent Labs Lab 01/09/17 1356 01/10/17 0526 01/12/17 0244 01/13/17 0534  WBC 5.9 7.2 7.3 5.9  NEUTROABS 4.0 5.5  --   --   HGB 10.6* 10.6* 9.5* 8.8*  HCT 31.2* 31.8* 29.4* 27.2*  MCV 83.6 84.4 86.5 87.5  PLT 127* 152 156 148*    Basic Metabolic Panel:   Recent Labs Lab 01/12/17 0244  01/12/17 1653 01/13/17 0534  NA 144  --   --  147*  K 4.0  --   --  4.0  CL 110  --   --  111  CO2 27  --   --  26  GLUCOSE 170*  --   --  192*  BUN 8  --   --  14  CREATININE 0.71  --   --  0.58  CALCIUM 10.3  --   --  10.5*  MG 2.1  < > 1.9 1.8  PHOS 2.5  < > 2.7 2.5  < > = values in this interval not displayed.   IMAGING Dg Chest Port 1 View  Result Date: 01/12/2017 CLINICAL DATA:  Respiratory failure EXAM: PORTABLE CHEST 1 VIEW COMPARISON:  yesterday FINDINGS: Endotracheal tube tip is just below the clavicular heads, stable. Orogastric tube reaches the stomach at least. There is no edema, consolidation, effusion, or pneumothorax. No cardiomegaly when accounting for technical factors. IMPRESSION: 1. Stable good positioning of endotracheal and orogastric tubes. 2. No evidence for active cardiopulmonary disease. Electronically Signed   By: Monte Fantasia M.D.   On: 01/12/2017 06:54     PHYSICAL EXAM General - cachectic, intubated not on sedation. No changes in exam today, stable.  Ophthalmologic -  Fundi not visualized due to left gaze.  Cardiovascular - Regular rate and rhythm.  Neuro - intubated no on sedation, does not open eye to sternal rub or pain, left gaze deviation does not cross midline, does not blink to threat, pupil 2.0 mm bilaterally, sluggish to light. Corneal positive and gag cough positive. Facial symmetry difficult to assess due to ET tube. Mild withdrawal in all 4 extremities to pain. DTR diminished, toes equivocal. Sensation, coordination and gait not tested.   ASSESSMENT/PLAN April Franklin is a 76 y.o. female with history of colon cancer, metastatic pancreatobiliary adenocarcinoma, HTN, HLD and DM presenting with progressive altered mental state, HA and double vision. She did not receive IV t-PA due to delay in arrival.   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 Infarct embolic likely secondary to hypercoagulable state from metastatic colon cancer.  Resultant unresponsive, comatose state  CT head subacute infarct R parietal lobe, no frank hematoma   MRI  extensive multi vascular supra and infratentorial infarcts. Right petechial hemorrhage.  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   Cerebral angiogram TICI 3 revascularization of occluded basilar artery  2D Echo  EF 60-65%   Carotid Doppler unremarkable   LE venous doppler  - right popliteal vein DVT  EEG diffuse slowing with superimposed left centrotemporal slowing   LDL 110  HgbA1c - 7.2  Lovenox 40 mg sq daily  for VTE prophylaxis Diet NPO time specified  No antithrombotic prior to admission, now on aspirin 325 mg daily.   Neuro prognosis poor in lady with metastatic cancer. Family meeting tomorrow. Dr. [name] discussed with daughter present.  Therapy  recommendations:  pending  Disposition:  Pending  Acute respiratory failure   Intubated for airway protection   CCM on board  Right LE DVT - likely due to hypercoagulable state secondary to metastatic colon cancer  Not candidate for anticoagulation due to large infarct with petechial hemorrhage  Discussed with family regarding no aggressive treatment vs. IVC filter. They would like a family meeting Tuesday to discuss. I recommended palliative care.   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 s/p liver bx 12/30/16  Likely the source of hypercoagulable state  Hypertension  Remains on cleviprex with SBP goal 120-140  Increase SBP goal to < 180  Wean Cleviprex  Hyperlipidemia  Home meds:  No statin  LDL 110, goal < 70  No statin due to elevated LFTs and liver metastasis  Diabetes  HgbA1c - 7.2  goal < 7.0  Other Stroke Risk Factors  Advanced age  Other Active Problems  Elevated troponins - trending down, likely demand ischemia  Arthritis  Anemia - hemoglobin stable 8.8  Hypomagnesemia, resolved  Hospital day # [place] for Pager information 01/13/2017 3:03 PM  I have personally examined this patient, reviewed notes, independently viewed imaging studies, participated in medical decision making and plan of care.ROS completed by me personally and pertinent positives fully documented  I have made any additions or clarifications directly to the above note. Agree with note above. I had a long discussion with the patient's daughter and niece regarding her poor prognosis and answered questions. She understands and is awaiting family meeting tomorrow morning with palliative care team to discuss goals of care. This patient is critically ill and at  significant risk of neurological worsening, death and care requires constant monitoring of vital signs, hemodynamics,respiratory and cardiac monitoring, extensive review of multiple databases, frequent neurological assessment, discussion with family, other specialists and medical decision making of high complexity.I have made any additions or clarifications directly to the above note.This critical care time does not reflect procedure time, or teaching time or supervisory time of PA/NP/Med Resident etc but could involve care discussion time.  I spent 32 minutes of neurocritical care time  in the care of  this patient.     Antony Contras, MD Medical Director [facility] Pager: [phone] 01/13/2017 5:11 PM  To contact Stroke Continuity provider, please refer to [email or web address] After hours, contact General Neurology

## 2017-01-13 NOTE — Progress Notes (Signed)
OT Cancellation Note  Patient Details Name: April Franklin MRN: [id number] DOB: 1941/05/27   Cancelled Treatment:    Reason Eval/Treat Not Completed: Patient not medically ready.  Per chart, family to make determination re: Goals of care on Tues 2/20.  Will check back to determine appropriateness of OT intervention after decision made.   Gage, OTR/L [id number]   Lucille Passy M 01/13/2017, 10:02 AM

## 2017-01-13 NOTE — Progress Notes (Signed)
Patient ID: April Franklin, female   DOB: Aug 05, 1941, 76 y.o.   MRN: [id number]    Referring Physician(s): Dr. Rosalin Hawking  Supervising Physician: Luanne Bras  Patient Status: [facility] - In-pt  Chief Complaint: CVA  Subjective: Patient with no response off sedation  Allergies: Patient has no known allergies.  Medications: Prior to Admission medications   Medication Sig Start Date End Date Taking? Authorizing Provider  cephALEXin (KEFLEX) 500 MG capsule Take 1 capsule (500 mg total) by mouth 2 (two) times daily. 01/04/17  Yes Kristen N Ward, DO  glipiZIDE (GLUCOTROL) 10 MG tablet Take 10 mg by mouth daily.   Yes Historical Provider, MD  Insulin Degludec (TRESIBA FLEXTOUCH) 200 UNIT/ML SOPN Inject 20 Units into the muscle every evening. After dinner 11/07/16  Yes Historical Provider, MD  linagliptin (TRADJENTA) 5 MG TABS tablet Take 5 mg by mouth daily.   Yes Historical Provider, MD  losartan (COZAAR) 50 MG tablet Take 50 mg by mouth daily.   Yes Historical Provider, MD  metFORMIN (GLUCOPHAGE) 1000 MG tablet Take 1,000 mg by mouth daily with breakfast.   Yes Historical Provider, MD  traMADol (ULTRAM) 50 MG tablet Take 50 mg by mouth every 6 (six) hours as needed for moderate pain.   Yes Historical Provider, MD    Vital Signs: BP (!) 126/53   Pulse 79   Temp 98.7 F (37.1 C) (Axillary)   Resp 20   Ht 5\' 2"  (1.575 m)   Wt 148 lb 9.4 oz (67.4 kg)   SpO2 100%   BMI 27.18 kg/m   Physical Exam: Neuro: no response, comatose Skin: right inguinal site is c/d/i with no hematoma or infection  Imaging: Ct Head Wo Contrast  Result Date: 01/10/2017 CLINICAL DATA:  Follow-up stroke. Status post revascularization of occluded basilar artery. History of hypertension, diabetes, colon cancer. EXAM: CT HEAD WITHOUT CONTRAST TECHNIQUE: Contiguous axial images were obtained from the base of the skull through the vertex without intravenous contrast. COMPARISON:  CT HEAD and MRI head January 09, 2015 FINDINGS: BRAIN: Evolving large RIGHT cerebellar infarct with faint central density. Partially effaced fourth ventricle, increased from prior imaging. Small LEFT cerebellar infarcts. Evolving RIGHT parietal occipital lobe infarcts with faint density. Additional scattered small infarcts as seen on yesterday's MRI. No lobar hematoma. Ventricles and sulci are overall normal for patient's age. No abnormal extra-axial fluid collections. Basal cisterns are patent. VASCULAR: Moderate calcific atherosclerosis of the carotid siphons. SKULL: No skull fracture. No significant scalp soft tissue swelling. SINUSES/ORBITS: The mastoid air-cells and included paranasal sinuses are well-aerated.The included ocular globes and orbital contents are non-suspicious. OTHER: None. IMPRESSION: Evolving multifocal infarcts with worsening RIGHT cerebellar cytotoxic edema partially effacing the fourth ventricle. No obstructive hydrocephalus. Contrast staining and/or petechial hemorrhage RIGHT parieto-occipital lobe and RIGHT cerebellar infarcts without frank hemorrhagic conversion. Electronically Signed   By: Elon Alas M.D.   On: 01/10/2017 04:34   Ct Head Wo Contrast  Result Date: 01/09/2017 CLINICAL DATA:  Altered mental status and headache EXAM: CT HEAD WITHOUT CONTRAST TECHNIQUE: Contiguous axial images were obtained from the base of the skull through the vertex without intravenous contrast. COMPARISON:  Head CT 01/04/2017 FINDINGS: Brain: There is a new focal area of hypoattenuation at the cortex of the right parietal lobe with loss of gray-white differentiation, consistent with acute infarct. No midline shift or mass effect. No extra-axial collection. No hemorrhage. There is periventricular hypoattenuation compatible with chronic microvascular disease. There is bilateral basal ganglia mineralization. Vascular: Atherosclerotic  calcification of the vertebral and internal carotid arteries at the skull base. Skull: Normal  visualized skull base, calvarium and extracranial soft tissues. Sinuses/Orbits: No sinus fluid levels or advanced mucosal thickening. No mastoid effusion. Normal orbits. IMPRESSION: 1. Subacute infarct of the posterosuperior right parietal lobe with expected evolution since the CT of 01/04/2017. No hemorrhage or mass effect. MRI could be obtained for further characterization. 2. Chronic microvascular ischemia. These results were called by telephone at the time of interpretation on 01/09/2017 at 2:34 pm to Dr. Quintella Reichert , who verbally acknowledged these results. Electronically Signed   By: Ulyses Jarred M.D.   On: 01/09/2017 14:37   Mr [name] Head Wo Contrast  Result Date: 01/10/2017 CLINICAL DATA:  Progressive altered mental status and confusion for several weeks. Headache. History of colon cancer, hypertension, diabetes. EXAM: MRI HEAD WITHOUT CONTRAST MRA HEAD WITHOUT CONTRAST TECHNIQUE: Multiplanar, multiecho pulse sequences of the brain and surrounding structures were obtained without intravenous contrast. Angiographic images of the head were obtained using MRA technique without contrast. COMPARISON:  CT HEAD January 09, 2017 at 1419 hours FINDINGS: MRI HEAD FINDINGS BRAIN: Patchy reduced diffusion RIGHT greater than LEFT (multi vascular territory) cerebellum. Scattered subcentimeter foci of reduced diffusion midbrain, LEFT temporal lobe and RIGHT basal ganglia. Confluent RIGHT greater than LEFT occipital lobe reduced diffusion and, RIGHT mesial temporal lobe. Patchy reduced diffusion predominately within white matter bilateral frontoparietal lobes. Low ADC values in T2 bright signal within all areas of reduced diffusion consistent with similar age. Punctate susceptibility artifact LEFT thalamus. Petechial hemorrhage RIGHT parietal and occipital lobes, to lesser extent RIGHT cerebellum. Ventricles and sulci are overall normal for patient's age. No midline shift. No masses. No abnormal extra-axial fluid  collections. VASCULAR: T2 bright signal within the basilar artery consistent with thromboembolism. SKULL AND UPPER CERVICAL SPINE: No abnormal sellar expansion. No suspicious calvarial bone marrow signal. Craniocervical junction maintained. SINUSES/ORBITS: The mastoid air-cells and included paranasal sinuses are well-aerated. Status post RIGHT ocular lens implant. The included ocular globes and orbital contents are non-suspicious. OTHER: None. MRA HEAD FINDINGS ANTERIOR CIRCULATION: Normal flow related enhancement of the included cervical, petrous, cavernous and supraclinoid internal carotid arteries. Patent anterior communicating artery. Normal flow related enhancement of the anterior and middle cerebral arteries, including distal segments. No large vessel occlusion, high-grade stenosis, abnormal luminal irregularity, aneurysm. POSTERIOR CIRCULATION: RIGHT vertebral artery is dominant. Loss of basilar artery flow related enhancement. Reconstitution via LEFT posterior communicating artery. Occluded RIGHT posterior cerebral artery from the origin. No collaterals identified which may reflect time-of-flight technique. No high-grade stenosis, abnormal luminal irregularity, aneurysm. ANATOMIC VARIANTS: None. IMPRESSION: MRI HEAD: Acute extensive multi vascular territory supra and infratentorial infarcts. RIGHT petechial hemorrhage without hemorrhagic conversion. 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 Reconstitution of basilar tip and LEFT posterior cerebral artery via LEFT posterior communicating artery. Acute findings discussed with and reconfirmed by Dr.MCNEILL [facility] on 01/09/2017 at 11:59 pm. Electronically Signed   By: Elon Alas M.D.   On: 01/10/2017 00:03   Mr Brain Wo Contrast  Result Date: 01/10/2017 CLINICAL DATA:  Progressive altered mental status and confusion for several weeks. Headache. History of colon cancer, hypertension, diabetes. EXAM: MRI HEAD  WITHOUT CONTRAST MRA HEAD WITHOUT CONTRAST TECHNIQUE: Multiplanar, multiecho pulse sequences of the brain and surrounding structures were obtained without intravenous contrast. Angiographic images of the head were obtained using MRA technique without contrast. COMPARISON:  CT HEAD January 09, 2017 at 1419 hours FINDINGS: MRI HEAD FINDINGS BRAIN: Patchy  reduced diffusion RIGHT greater than LEFT (multi vascular territory) cerebellum. Scattered subcentimeter foci of reduced diffusion midbrain, LEFT temporal lobe and RIGHT basal ganglia. Confluent RIGHT greater than LEFT occipital lobe reduced diffusion and, RIGHT mesial temporal lobe. Patchy reduced diffusion predominately within white matter bilateral frontoparietal lobes. Low ADC values in T2 bright signal within all areas of reduced diffusion consistent with similar age. Punctate susceptibility artifact LEFT thalamus. Petechial hemorrhage RIGHT parietal and occipital lobes, to lesser extent RIGHT cerebellum. Ventricles and sulci are overall normal for patient's age. No midline shift. No masses. No abnormal extra-axial fluid collections. VASCULAR: T2 bright signal within the basilar artery consistent with thromboembolism. SKULL AND UPPER CERVICAL SPINE: No abnormal sellar expansion. No suspicious calvarial bone marrow signal. Craniocervical junction maintained. SINUSES/ORBITS: The mastoid air-cells and included paranasal sinuses are well-aerated. Status post RIGHT ocular lens implant. The included ocular globes and orbital contents are non-suspicious. OTHER: None. MRA HEAD FINDINGS ANTERIOR CIRCULATION: Normal flow related enhancement of the included cervical, petrous, cavernous and supraclinoid internal carotid arteries. Patent anterior communicating artery. Normal flow related enhancement of the anterior and middle cerebral arteries, including distal segments. No large vessel occlusion, high-grade stenosis, abnormal luminal irregularity, aneurysm. POSTERIOR  CIRCULATION: RIGHT vertebral artery is dominant. Loss of basilar artery flow related enhancement. Reconstitution via LEFT posterior communicating artery. Occluded RIGHT posterior cerebral artery from the origin. No collaterals identified which may reflect time-of-flight technique. No high-grade stenosis, abnormal luminal irregularity, aneurysm. ANATOMIC VARIANTS: None. IMPRESSION: MRI HEAD: Acute extensive multi vascular territory supra and infratentorial infarcts. RIGHT petechial hemorrhage without hemorrhagic conversion. 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 Reconstitution of basilar tip and LEFT posterior cerebral artery via LEFT posterior communicating artery. Acute findings discussed with and reconfirmed by Dr.MCNEILL [facility] on 01/09/2017 at 11:59 pm. Electronically Signed   By: Elon Alas M.D.   On: 01/10/2017 00:03   Dg Chest Port 1 View  Result Date: 01/12/2017 CLINICAL DATA:  Respiratory failure EXAM: PORTABLE CHEST 1 VIEW COMPARISON:  yesterday FINDINGS: Endotracheal tube tip is just below the clavicular heads, stable. Orogastric tube reaches the stomach at least. There is no edema, consolidation, effusion, or pneumothorax. No cardiomegaly when accounting for technical factors. IMPRESSION: 1. Stable good positioning of endotracheal and orogastric tubes. 2. No evidence for active cardiopulmonary disease. Electronically Signed   By: Monte Fantasia M.D.   On: 01/12/2017 06:54   Dg Chest Port 1 View  Result Date: 01/11/2017 CLINICAL DATA:  Initial evaluation for ventilator management. EXAM: PORTABLE CHEST 1 VIEW COMPARISON:  Prior radiograph from 01/10/2017. FINDINGS: Patient is intubated with the tip of an endotracheal tube positioned 2.5 cm above the carina. Enteric tube courses in the the abdomen. Cardiac and mediastinal silhouettes are stable, and remain within normal limits. Aortic atherosclerosis noted. Lungs are hypoinflated with elevation  left hemidiaphragm, stable. No focal infiltrates, pulmonary edema, or pleural effusion. No pneumothorax. Osseous structures unchanged. IMPRESSION: 1. Endotracheal tube in satisfactory position with the tip positioned 2.5 cm above the carina. 2. No radiographic evidence for acute cardiopulmonary abnormality. Electronically Signed   By: Jeannine Boga M.D.   On: 01/11/2017 04:52   Portable Chest Xray  Result Date: 01/10/2017 CLINICAL DATA:  Endotracheal tube placement after IR procedure. EXAM: PORTABLE CHEST 1 VIEW COMPARISON:  Chest radiograph August 24, 2010 FINDINGS: Endotracheal tube tip projects 2.2 cm above the carina. Nasogastric tube past proximal stomach, distal tip out of field-of-view. Cardiomediastinal silhouette is normal. No pleural effusions or focal consolidations. Trachea  projects midline and there is no pneumothorax. Soft tissue planes and included osseous structures are non-suspicious. IMPRESSION: Endotracheal tube tip projects 2.2 cm above the carina. Nasogastric tube past proximal stomach. No acute cardiopulmonary process. Electronically Signed   By: Elon Alas M.D.   On: 01/10/2017 05:33    Labs:  CBC:  Recent Labs  01/09/17 1356 01/10/17 0526 01/12/17 0244 01/13/17 0534  WBC 5.9 7.2 7.3 5.9  HGB 10.6* 10.6* 9.5* 8.8*  HCT 31.2* 31.8* 29.4* 27.2*  PLT 127* 152 156 148*    COAGS:  Recent Labs  01/10/17 0103  INR 1.28  APTT 27    BMP:  Recent Labs  01/10/17 0526 01/11/17 0301 01/12/17 0244 01/13/17 0534  NA 143 143 144 147*  K 3.6 3.5 4.0 4.0  CL 105 107 110 111  CO2 24 24 27 26   GLUCOSE 154* 185* 170* 192*  BUN 9 9 8 14   CALCIUM 10.0 10.1 10.3 10.5*  CREATININE 0.86 0.76 0.71 0.58  GFRNONAA >60 >60 >60 >60  GFRAA >60 >60 >60 >60    LIVER FUNCTION TESTS:  Recent Labs  01/04/17 0353 01/09/17 1356 01/10/17 0104 01/12/17 0244  BILITOT 1.6* 1.3* 1.1  --   AST 119* 87* 79*  --   ALT 152* 118* 117*  --   ALKPHOS 329* 337* 341*   --   PROT 6.0* 6.2* 6.5  --   ALBUMIN 3.5 3.3* 3.4* 2.9*    Assessment and Plan: 1. S/p basilar artery revascularization on 2/16 -patient remains comatose -Right inguinal site is clean, bandaid can be placed over this -further care per CCM and neuro regarding direction of care.  Electronically Signed: Henreitta Cea 01/13/2017, 11:35 AM   I spent a total of 15 Minutes at the the patient's bedside AND on the patient's hospital floor or unit, greater than 50% of which was counseling/coordinating care for basilar artery CVA

## 2017-01-13 NOTE — Progress Notes (Addendum)
Examination of left arm where cleviprex was infusing showed increased swelling in forearm and hand consistent with infiltration. Pharmacy notified via telephone before pulling IV. MD notified of infiltration and received verbal order to restart cleviprex at low rate consistent with prior BP control. Family at bedside notified.

## 2017-01-13 NOTE — Progress Notes (Signed)
SLP Cancellation Note  Patient Details Name: April Franklin MRN: [id number] DOB: May 04, 1941   Cancelled treatment:       Reason Eval/Treat Not Completed: Patient not medically ready   Germain Osgood 01/13/2017, 8:50 AM  Germain Osgood, M.A. CCC-SLP [phone]

## 2017-01-14 ENCOUNTER — Inpatient Hospital Stay (HOSPITAL_COMMUNITY): Payer: Medicare Other

## 2017-01-14 ENCOUNTER — Encounter (HOSPITAL_COMMUNITY): Payer: Self-pay | Admitting: Interventional Radiology

## 2017-01-14 DIAGNOSIS — G9340 Encephalopathy, unspecified: Secondary | ICD-10-CM

## 2017-01-14 LAB — BASIC METABOLIC PANEL
Anion gap: 6 (ref 5–15)
BUN: 18 mg/dL (ref 6–20)
CO2: 29 mmol/L (ref 22–32)
Calcium: 10.2 mg/dL (ref 8.9–10.3)
Chloride: 115 mmol/L — ABNORMAL HIGH (ref 101–111)
Creatinine, Ser: 0.61 mg/dL (ref 0.44–1.00)
GFR calc Af Amer: >60 mL/min (ref >60)
Glucose, Bld: 272 mg/dL — ABNORMAL HIGH (ref 65–99)
Potassium: 4.4 mmol/L (ref 3.5–5.1)
SODIUM: 150 mmol/L — AB (ref 135–145)

## 2017-01-14 LAB — CBC
HCT: 24 % — ABNORMAL LOW (ref 36.0–46.0)
Hemoglobin: 7.7 g/dL — ABNORMAL LOW (ref 12.0–15.0)
MCH: 28.4 pg (ref 26.0–34.0)
MCHC: 32.1 g/dL (ref 30.0–36.0)
MCV: 88.6 fL (ref 78.0–100.0)
PLATELETS: 135 10*3/uL — AB (ref 150–400)
RBC: 2.71 MIL/uL — ABNORMAL LOW (ref 3.87–5.11)
RDW: 14 % (ref 11.5–15.5)
WBC: 4.4 10*3/uL (ref 4.0–10.5)

## 2017-01-14 LAB — GLUCOSE, CAPILLARY
GLUCOSE-CAPILLARY: 161 mg/dL — AB (ref 65–99)
GLUCOSE-CAPILLARY: 180 mg/dL — AB (ref 65–99)
GLUCOSE-CAPILLARY: 223 mg/dL — AB (ref 65–99)
GLUCOSE-CAPILLARY: 268 mg/dL — AB (ref 65–99)
GLUCOSE-CAPILLARY: 270 mg/dL — AB (ref 65–99)

## 2017-01-14 MED ORDER — FLEET ENEMA 7-19 GM/118ML RE ENEM
1.0000 | ENEMA | Freq: Every day | RECTAL | Status: DC | PRN
  Administered 2017-01-16: 1 via RECTAL
  Filled 2017-01-14: qty 1

## 2017-01-14 MED ORDER — MAGNESIUM HYDROXIDE 400 MG/5ML PO SUSP
30.0000 mL | Freq: Every day | ORAL | Status: DC | PRN
  Administered 2017-01-14 – 2017-01-15 (×2): 30 mL
  Filled 2017-01-14 (×3): qty 30

## 2017-01-14 MED ORDER — INSULIN ASPART 100 UNIT/ML ~~LOC~~ SOLN
0.0000 [IU] | SUBCUTANEOUS | Status: DC
  Administered 2017-01-14: 3 [IU] via SUBCUTANEOUS
  Administered 2017-01-14: 5 [IU] via SUBCUTANEOUS
  Administered 2017-01-14: 3 [IU] via SUBCUTANEOUS
  Administered 2017-01-14 – 2017-01-15 (×2): 8 [IU] via SUBCUTANEOUS
  Administered 2017-01-15: 5 [IU] via SUBCUTANEOUS
  Administered 2017-01-15: 3 [IU] via SUBCUTANEOUS
  Administered 2017-01-15: 8 [IU] via SUBCUTANEOUS
  Administered 2017-01-15 – 2017-01-16 (×3): 5 [IU] via SUBCUTANEOUS
  Administered 2017-01-16: 8 [IU] via SUBCUTANEOUS
  Administered 2017-01-16: 2 [IU] via SUBCUTANEOUS
  Administered 2017-01-16: 3 [IU] via SUBCUTANEOUS
  Administered 2017-01-16: 8 [IU] via SUBCUTANEOUS
  Administered 2017-01-17 (×3): 5 [IU] via SUBCUTANEOUS
  Administered 2017-01-17 (×2): 8 [IU] via SUBCUTANEOUS
  Administered 2017-01-18 (×2): 3 [IU] via SUBCUTANEOUS
  Administered 2017-01-18: 8 [IU] via SUBCUTANEOUS
  Administered 2017-01-18: 5 [IU] via SUBCUTANEOUS
  Administered 2017-01-18: 3 [IU] via SUBCUTANEOUS
  Administered 2017-01-18 – 2017-01-19 (×3): 5 [IU] via SUBCUTANEOUS
  Administered 2017-01-19: 3 [IU] via SUBCUTANEOUS
  Administered 2017-01-19 (×2): 5 [IU] via SUBCUTANEOUS
  Administered 2017-01-19: 3 [IU] via SUBCUTANEOUS
  Administered 2017-01-20: 5 [IU] via SUBCUTANEOUS
  Administered 2017-01-20: 2 [IU] via SUBCUTANEOUS
  Administered 2017-01-20: 5 [IU] via SUBCUTANEOUS
  Administered 2017-01-20: 3 [IU] via SUBCUTANEOUS
  Administered 2017-01-21 (×2): 2 [IU] via SUBCUTANEOUS
  Administered 2017-01-21: 3 [IU] via SUBCUTANEOUS
  Administered 2017-01-22 (×4): 2 [IU] via SUBCUTANEOUS
  Administered 2017-01-24 (×2): 3 [IU] via SUBCUTANEOUS
  Administered 2017-01-24: 2 [IU] via SUBCUTANEOUS
  Administered 2017-01-24: 3 [IU] via SUBCUTANEOUS
  Administered 2017-01-25 (×4): 2 [IU] via SUBCUTANEOUS
  Administered 2017-01-26 (×4): 3 [IU] via SUBCUTANEOUS
  Administered 2017-01-27: 2 [IU] via SUBCUTANEOUS
  Administered 2017-01-27 – 2017-01-28 (×3): 3 [IU] via SUBCUTANEOUS
  Administered 2017-01-28 (×2): 2 [IU] via SUBCUTANEOUS
  Administered 2017-01-28: 3 [IU] via SUBCUTANEOUS
  Administered 2017-01-28: 2 [IU] via SUBCUTANEOUS
  Administered 2017-01-29: 5 [IU] via SUBCUTANEOUS
  Administered 2017-01-29: 3 [IU] via SUBCUTANEOUS
  Administered 2017-01-29 (×2): 8 [IU] via SUBCUTANEOUS
  Administered 2017-01-29: 3 [IU] via SUBCUTANEOUS
  Administered 2017-01-29: 8 [IU] via SUBCUTANEOUS
  Administered 2017-01-29: 3 [IU] via SUBCUTANEOUS
  Administered 2017-01-30 (×2): 5 [IU] via SUBCUTANEOUS
  Administered 2017-01-30: 11 [IU] via SUBCUTANEOUS
  Administered 2017-01-30: 5 [IU] via SUBCUTANEOUS
  Administered 2017-01-30: 15 [IU] via SUBCUTANEOUS
  Administered 2017-01-31: 3 [IU] via SUBCUTANEOUS
  Administered 2017-01-31 (×3): 5 [IU] via SUBCUTANEOUS

## 2017-01-14 MED ORDER — INSULIN ASPART 100 UNIT/ML ~~LOC~~ SOLN: 1.0000 [IU] | SUBCUTANEOUS | Status: DC | PRN

## 2017-01-14 MED ORDER — PRO-STAT SUGAR FREE PO LIQD
30.0000 mL | Freq: Two times a day (BID) | ORAL | Status: DC
  Administered 2017-01-14 – 2017-01-15 (×3): 30 mL
  Filled 2017-01-14 (×3): qty 30

## 2017-01-14 MED ORDER — FREE WATER
240.0000 mL | Freq: Four times a day (QID) | Status: DC
  Administered 2017-01-14 – 2017-01-31 (×46): 240 mL

## 2017-01-14 MED ORDER — VITAL AF 1.2 CAL PO LIQD
1000.0000 mL | ORAL | Status: DC
  Administered 2017-01-14 – 2017-01-20 (×6): 1000 mL
  Filled 2017-01-14 (×4): qty 1000

## 2017-01-14 MED ORDER — FREE WATER
200.0000 mL | Freq: Three times a day (TID) | Status: DC
  Administered 2017-01-14 (×2): 200 mL

## 2017-01-14 MED ORDER — INSULIN GLARGINE 100 UNIT/ML ~~LOC~~ SOLN
15.0000 [IU] | Freq: Every day | SUBCUTANEOUS | Status: DC
  Administered 2017-01-14 – 2017-01-18 (×4): 15 [IU] via SUBCUTANEOUS
  Filled 2017-01-14 (×4): qty 0.15

## 2017-01-14 NOTE — Progress Notes (Signed)
  Interdisciplinary Goals of Care Family Meeting   Date carried out:: 01/14/2017  Location of the meeting: [place]  Member's involved: Physician, Nurse Practitioner, Bedside Registered Nurse and Family Member or next of kin  Durable Power of Attorney or acting medical decision maker: Her husband, but all children were included in decision making.    Discussion: We discussed goals of care for April Franklin .  Discussed her course up until this point including chronic illness, recent diagnosis of metastatic adenocarcinoma, and debilitating stroke. She remains on ventilator and is comatose. It was made clear to them that the possibility or a functional recovery is negligible. The result of this conversation is that her code status will change to DNR, but we are to proceed with tracheostomy and PEG placement.   Code status: Full DNR  Disposition: Continue current acute care  Time spent for the meeting: 49 mins  Georgann Housekeeper, AGACNP-BC [facility] Pulmonology/Critical Care Pager [phone] or [phone]  01/14/2017 11:24 AM

## 2017-01-14 NOTE — Progress Notes (Signed)
STROKE TEAM PROGRESS NOTE   SUBJECTIVE (INTERVAL HISTORY) Family meeting scheduled fdor today at [place]. Pt remains intubated. Movement present that family sees it reflexive in nature.   OBJECTIVE Temp:  [98.8 F (37.1 C)-100.1 F (37.8 C)] 99.4 F (37.4 C) (02/20 0800) Pulse Rate:  [65-92] 69 (02/20 0800) Cardiac Rhythm: Normal sinus rhythm (02/19 2000) Resp:  [18-25] 25 (02/20 0800) BP: (121-156)/(52-67) 141/53 (02/20 0800) SpO2:  [100 %] 100 % (02/20 0800) FiO2 (%):  [30 %] 30 % (02/20 0800) Weight:  [67.9 kg (149 lb 11.1 oz)] 67.9 kg (149 lb 11.1 oz) (02/20 0500)  CBC:   Recent Labs Lab 01/09/17 1356 01/10/17 0526  01/13/17 0534 01/14/17 0306  WBC 5.9 7.2  < > 5.9 4.4  NEUTROABS 4.0 5.5  --   --   --   HGB 10.6* 10.6*  < > 8.8* 7.7*  HCT 31.2* 31.8*  < > 27.2* 24.0*  MCV 83.6 84.4  < > 87.5 88.6  PLT 127* 152  < > 148* 135*  < > = values in this interval not displayed.  Basic Metabolic Panel:   Recent Labs Lab 01/13/17 0534 01/13/17 1711 01/14/17 0306  NA 147*  --  150*  K 4.0  --  4.4  CL 111  --  115*  CO2 26  --  29  GLUCOSE 192*  --  272*  BUN 14  --  18  CREATININE 0.58  --  0.61  CALCIUM 10.5*  --  10.2  MG 1.8 1.7  --   PHOS 2.5 1.9*  --      IMAGING Dg Chest Port 1 View  Result Date: 01/14/2017 CLINICAL DATA:  Respiratory failure. EXAM: PORTABLE CHEST 1 VIEW COMPARISON:  01/12/2017 FINDINGS: Endotracheal tube is 2.4 cm above the carina. Nasogastric tube extends into the abdomen. Both lungs are clear without airspace disease or pulmonary edema. Negative for a pneumothorax. Heart size is normal. Atherosclerotic calcifications at the aortic arch. IMPRESSION: No focal lung disease. Support apparatuses as described. Electronically Signed   By: Markus Daft M.D.   On: 01/14/2017 07:55     PHYSICAL EXAM General - cachectic, intubated not on sedation. No changes in exam today, stable.  Ophthalmologic - Fundi not visualized due to left  gaze.  Cardiovascular - Regular rate and rhythm.  Neuro - intubated not on sedation, does not open eye to sternal rub or pain, left gaze deviation does not cross midline, does not blink to threat, pupil 2.0 mm bilaterally, sluggish to light. Corneal sluggish on the right and preserved on the left. and gag cough positive. Facial symmetry difficult to assess due to ET tube. Mild withdrawal in all 4 extremities to pain. DTR diminished, toes equivocal. Sensation, coordination and gait not tested.   ASSESSMENT/PLAN April Franklin is a 76 y.o. female with history of colon cancer, metastatic pancreatobiliary adenocarcinoma, HTN, HLD and DM presenting with progressive altered mental state, HA and double vision. She did not receive IV t-PA due to delay in arrival.   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 Infarct embolic likely secondary to hypercoagulable state from metastatic colon cancer.  Resultant unresponsive, comatose state  CT head subacute infarct R parietal lobe, no frank hematoma   MRI  extensive multi vascular supra and infratentorial infarcts. Right petechial hemorrhage.  MRA  basilar artery and R PCA occlusion, I suspect this  is chronic changes due to lack of brainstem and PCA ischemia.   Cerebral angiogram TICI 3 revascularization of occluded basilar artery  2D Echo  EF 60-65%   Carotid Doppler unremarkable   LE venous doppler  - right popliteal vein DVT  EEG diffuse slowing with superimposed left centrotemporal slowing   LDL 110  HgbA1c - 7.2  Lovenox 40 mg sq daily  for VTE prophylaxis Diet NPO time specified  No antithrombotic prior to admission, now on aspirin 325 mg daily.   Neuro prognosis poor in lady with metastatic cancer. Family meeting tomorrow. Dr. [name] discussed with different daughter present  today.  Therapy recommendations:  pending  Disposition:  Pending  Acute respiratory failure   Intubated for airway protection   Unable to extubated d/t neuro status  CCM on board  Right LE DVT - likely due to hypercoagulable state secondary to metastatic colon cancer  Not candidate for anticoagulation due to large infarct with petechial hemorrhage  Aggressive treatment vs. IVC filter.  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 s/p liver bx 12/30/16  Likely the source of hypercoagulable state  Hypertension  SBP goal < 180  offf Cleviprex  Hyperlipidemia  Home meds:  No statin  LDL 110, goal < 70  No statin due to elevated LFTs and liver metastasis  Diabetes  HgbA1c - 7.2  goal < 7.0  Other Stroke Risk Factors  Advanced age  Other Active Problems  Encephalopathy d/t stroke  Elevated troponins - trending down, likely demand ischemia  Arthritis  Anemia - hemoglobin stable 8.8  Hypomagnesemia, resolved  Family meeting today at [place] - palliative care and neuro to be present  Hospital day # [place] for Pager information 01/14/2017 9:26 AM  I have personally examined this patient, reviewed notes, independently viewed imaging studies, participated in medical decision making and plan of care.ROS completed by me personally and pertinent positives fully documented  I have made any additions or clarifications directly to the above note. Agree with note above.  I had a long 30 minute discussion with the patient's husband, daughters and multiple family members and discussed her prognosis and plan of care. Despite my stating clearly that her chances of survival are miniscule and quality of life likely terrible given her  disabling stroke as well as recent diagnosis of metastatic adenocarcinoma family wishes to pursue ongoing aggressive care including prolonged ventilatory support and want tracheostomy and PEG tube. April Housekeeper, PA-c from pulmonary critical care physician was also present. Family however they agree to no resuscitation and CPR. This patient is critically ill and at significant risk of neurological worsening, death and care requires constant monitoring of vital signs, hemodynamics,respiratory and cardiac monitoring, extensive review of multiple databases, frequent neurological assessment, discussion with family, other specialists and medical decision making of high complexity.I have made any additions or clarifications directly to the above note.This critical care time does not reflect procedure time, or teaching time or supervisory time of PA/NP/Med Resident etc but could involve care discussion time.  I spent 60 minutes of neurocritical care time  in the care of  this patient.     April Contras, MD Medical Director [place] Pager: [phone] 01/14/2017 2:02 PM    To contact Stroke Continuity provider, please  refer to [email or web address] After hours, contact General Neurology

## 2017-01-14 NOTE — Care Management Note (Signed)
Case Management Note  Patient Details  Name: April Franklin MRN: [id number] Date of Birth: [birth year]  Subjective/Objective:   Pt admitted on 01/09/17 s/p stroke with acute respiratory failure.  PTA, pt resided at home with spouse.                    Action/Plan: Pt remains intubated currently; goals of care meeting this morning with family.  They wish to proceed with trach and PEG.  Will follow progress.    Expected Discharge Date:   (unknown)               Expected Discharge Plan:  [place]  In-House Referral:     Discharge planning Services  CM Consult  Post Acute Care Choice:    Choice offered to:     DME Arranged:    DME Agency:     HH Arranged:    HH Agency:     Status of Service:  In process, will continue to follow  If discussed at Long Length of Stay Meetings, dates discussed:    Additional Comments:  Reinaldo Raddle, RN, BSN  Trauma/Neuro ICU Case Manager [phone]

## 2017-01-14 NOTE — Progress Notes (Signed)
Inpatient Diabetes Program Recommendations  AACE/ADA: New Consensus Statement on Inpatient Glycemic Control (2015)  Target Ranges:  Prepandial:   less than 140 mg/dL      Peak postprandial:   less than 180 mg/dL (1-2 hours)      Critically ill patients:  140 - 180 mg/dL   Results for April Franklin, April Franklin (MRN [id number]) as of 01/14/2017 09:27  Ref. Range 01/13/2017 08:13 01/13/2017 12:15 01/13/2017 15:53 01/13/2017 19:11 01/13/2017 23:10 01/14/2017 03:05 01/14/2017 08:11  Glucose-Capillary Latest Ref Range: 65 - 99 mg/dL 188 (H) 264 (H) 256 (H) 200 (H) 260 (H) 270 (H) 268 (H)   Review of Glycemic Control  Diabetes history: DM 2 Outpatient Diabetes medications: Glipizide 10 mg Daily, Tresiba 20 units QHS, Tradjenta 5 mg Daily, Metformin 1000 mg Daily Current orders for Inpatient glycemic control: Lantus 15 units Novolog Moderate Q4 hours.  Inpatient Diabetes Program Recommendations:   If glucose remains elevated after insulin dose adjustments today, please consider Novolog Tube Feed Coverage.  Thanks,  Tama Headings RN, MSN, [facility] Inpatient Diabetes Coordinator Team Pager [phone] (8a-5p)

## 2017-01-14 NOTE — Progress Notes (Signed)
OT Cancellation Note  Patient Details Name: April Franklin MRN: [id number] DOB: [birth year]   Cancelled Treatment:    Reason Eval/Treat Not Completed: Medical issues which prohibited therapy.  Pt remains intubated and unresponsive.  She has DVT and is not anticoagulated which is contraindicated for mobility.  At this time, OT with little to offer.  Will sign off, and please reorder, if she becomes more responsive.    [place], OTR/L [id number]   Lucille Passy M 01/14/2017, 12:45 PM

## 2017-01-14 NOTE — Progress Notes (Signed)
[facility]  Presenting HPI:  76 y.o. female with known history of diabetes mellitus, colon cancer postresection, hypertension, and hyperlipidemia. Status post liver biopsy of hepatic masses with MRCP on 12/30/16. Since biopsy patient has progressively declined complaining of headaches, fatigue, and confusion which markedly worsened on 2/13. Patient was found to have a subacute infarct on CT of the head upon admission. MRI demonstrated extensive areas of infarct emerging from the basilar artery and right posterior communicating artery with occlusion. Patient underwent revascularization in interventional radiology and return to the ICU chemically ventilated.  Subjective: No acute events, remains comatose off sedation. Weaned well from respiratory standpoint and weaning today.   Objective:  Vent Mode: PRVC FiO2 (%):  [30 %] 30 % Set Rate:  [18 bmp] 18 bmp Vt Set:  [400 mL] 400 mL PEEP:  [5 cmH20] 5 cmH20 Pressure Support:  [5 cmH20-8 cmH20] 8 cmH20 Plateau Pressure:  [14 cmH20-15 cmH20] 15 cmH20  Temp:  [98.7 F (37.1 C)-100.1 F (37.8 C)] 98.8 F (37.1 C) (02/20 0400) Pulse Rate:  [65-92] 72 (02/20 0732) Resp:  [18-23] 18 (02/20 0732) BP: (121-156)/(52-67) 137/54 (02/20 0732) SpO2:  [100 %] 100 % (02/20 0732) FiO2 (%):  [30 %] 30 % (02/20 0732) Weight:  [67.9 kg (149 lb 11.1 oz)] 67.9 kg (149 lb 11.1 oz) (02/20 0500)  General:  Elderly female on vent appears to be resting comfortably with family at bedside. Neuro:  Comatose on vent. Cough/gag intact. Corneal intact. Pupils sluggish. Mild withdrawal x 4 extrem. HEENT:  [place]/AT, no JVD, ETT in place Cardiovascular:  RRR, no MRG Lungs: Clear bilateral breath sounds Abdomen:  Soft, non-distended Musculoskeletal:  No acute deformity Skin:  Intact, MMM    LINES/TUBES: OETT 7.5 2/16 >>> Foley 2/16 >>> OGT 2/16 >>> PIV  CBC Latest Ref Rng & Units 01/14/2017 01/13/2017 01/12/2017  WBC 4.0 - 10.5 K/uL 4.4 5.9 7.3   Hemoglobin 12.0 - 15.0 g/dL 7.7(L) 8.8(L) 9.5(L)  Hematocrit 36.0 - 46.0 % 24.0(L) 27.2(L) 29.4(L)  Platelets 150 - 400 K/uL 135(L) 148(L) 156    BMP Latest Ref Rng & Units 01/14/2017 01/13/2017 01/12/2017  Glucose 65 - 99 mg/dL 272(H) 192(H) 170(H)  BUN 6 - 20 mg/dL 18 14 8   Creatinine 0.44 - 1.00 mg/dL 0.61 0.58 0.71  Sodium 135 - 145 mmol/L 150(H) 147(H) 144  Potassium 3.5 - 5.1 mmol/L 4.4 4.0 4.0  Chloride 101 - 111 mmol/L 115(H) 111 110  CO2 22 - 32 mmol/L 29 26 27   Calcium 8.9 - 10.3 mg/dL 10.2 10.5(H) 10.3   Hepatic Function Latest Ref Rng & Units 01/12/2017 01/10/2017 01/09/2017  Total Protein 6.5 - 8.1 g/dL - 6.5 6.2(L)  Albumin 3.5 - 5.0 g/dL 2.9(L) 3.4(L) 3.3(L)  AST 15 - 41 U/L - 79(H) 87(H)  ALT 14 - 54 U/L - 117(H) 118(H)  Alk Phosphatase 38 - 126 U/L - 341(H) 337(H)  Total Bilirubin 0.3 - 1.2 mg/dL - 1.1 1.3(H)  Bilirubin, Direct 0.1 - 0.5 mg/dL - 0.3 -    IMAGING/STUDIES: MRI/MRA HEAD 2/15: IMPRESSION: MRI HEAD: Acute extensive multi vascular territory supra and infratentorial infarcts. RIGHT petechial hemorrhage without hemorrhagic conversion. 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 Reconstitution of basilar tip and LEFT posterior cerebral artery via LEFT posterior communicating artery. CT HEAD W/O 2/16:  Evolving multifocal infarcts with worsening RIGHT cerebellar cytotoxic edema partially effacing the fourth ventricle. No obstructive hydrocephalus. Contrast staining and/or petechial hemorrhage RIGHT parieto-occipital lobe  and RIGHT cerebellar infarcts without frank hemorrhagic conversion. BILATERAL LE VENOUS DUPLEX 2/16: Acute DVT involving the peroneal veins of the right lower extremity. No evidence of superficial vein thrombosis of the right lower extremity. No evidence of deep or superficial vein thrombosis of the left lower extremity. PORT CXR 2/18:  Personally reviewed by me. No focal opacity or mass appreciated. No pleural  effusion. Elevation of left hemidiaphragm. Endotracheal tube in good position. Enteric feeding tube coursing below diaphragm.  MICROBIOLOGY: Urine Culture 2/10:  Multiple species present MRSA PCR 2/16:  Negative   ANTIBIOTICS: None.  SIGNIFICANT EVENTS: 02/05 - MRCP with hepatic mass biopsies 02/15 - Admit 02/16 - basilar artery revascularization in interventional radiology  ASSESSMENT/PLAN:  77 y.o.  female with acute respiratory failure unable to protect her airway post basilar artery occlusion and revascularization on 2/16. Patient also with hepatic metastases consistent with adenocarcinoma on biopsy from 2/5. Patient's mental status and neurologic recovery seems stagnant and essentially nonexistent. Family understands this, but are praying for a miracle.   1. Acute respiratory failure: weaning well but neurologic status prevents extubation 2. Acute CVA secondary to basilar artery occlusion: S/p IR revascularization. IR and Stroke following/directing care. 3. Acute encephalopathy/comatose status- secondary to CVA: Holding sedating meds. Likely will not improve, poor prognosis per neuro.  4. Right lower extremity calf vein DVT: Present with peroneal vein. Seen on 2/6 ultrasound. Holding anticoagulation given low embolization risk and hemorrhagic conversion risk.  5. Metastatic adenocarcinoma: Metastases to the liver. Discovered on MRCP biopsy 2/5. Further workup pending survival. 6. Anemia: No signs of bleeing. Trend hemoglobin daily with CBC. Continued to trend down, will order FOBT and anemia studies. Transfuse for HBG < 7 7. Diabetes mellitus: Glucose control. Continuing to hold home metformin and glipizide. Continuing Accu-Cheks every 4 hours and sliding scale insulin per regular algorithm. Will increase Lantus to 15units daily. 8. Hypernatremia: Start free water 253m TID  Prophylaxis: SCDs, Lovenox subcutaneous daily, & Protonix IV daily. Diet: NPO. Tube feeds per dietary Code  Status: Currently full code. Disposition: ICU Family Update: Have set up family meeting for 10am today.  PGeorgann Housekeeper AGACNP-BC LCarolina Healthcare Associates IncPulmonology/Critical Care Pager [phone] ([phone] 01/14/2017 7:39 AM

## 2017-01-14 NOTE — Progress Notes (Signed)
Nutrition Follow-up  DOCUMENTATION CODES:   Severe malnutrition in context of acute illness/injury  INTERVENTION:   Increase Vital AF 1.2 to 40 ml/hr Decrease Prostat to 30 ml BID Provides: 1352 kcal, 102 grams protein, and 778 ml free water.   Free water 200 ml every 8 hours Total free water: 1378 ml  NUTRITION DIAGNOSIS:   Malnutrition (Severe) related to acute illness as evidenced by energy intake < or equal to 50% for > or equal to 5 days, severe depletion of muscle mass, percent weight loss. Ongoing.   GOAL:   Patient will meet greater than or equal to 90% of their needs Progressing.   MONITOR:   I & O's, Vent status, Labs  ASSESSMENT:   Pt withn PMH of DM, HTN, colon cancer s/p resection 2009. 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 She underwent IR intervention with revascularization and is now in ICU on vent.  Pt discussed during ICU rounds and with RN.  Pt now DNR but plan is for trach and PEG Cleviprex now off CBG's remain elevated Na 150  Patient is currently intubated on ventilator support MV: 9 L/min Temp (24hrs), Avg:99.1 F (37.3 C), Min:98.4 F (36.9 C), Max:99.8 F (37.7 C)    Diet Order:  Diet NPO time specified  Skin:  Reviewed, no issues  Last BM:  unknown  Height:   Ht Readings from Last 1 Encounters:  01/10/17 5\' 2"  (1.575 m)    Weight:   Wt Readings from Last 1 Encounters:  01/14/17 149 lb 11.1 oz (67.9 kg)    Ideal Body Weight:  50 kg  BMI:  Body mass index is 27.38 kg/m.  Estimated Nutritional Needs:   Kcal:  1392  Protein:  90-110 grams  Fluid:  > 1.5 L/day  EDUCATION NEEDS:   No education needs identified at this time  [place], [place], [place]  Pager [phone] After Hours Pager

## 2017-01-15 ENCOUNTER — Ambulatory Visit: Payer: Medicare Other | Admitting: Hematology

## 2017-01-15 DIAGNOSIS — G9340 Encephalopathy, unspecified: Secondary | ICD-10-CM

## 2017-01-15 LAB — CBC
HCT: 24.9 % — ABNORMAL LOW (ref 36.0–46.0)
HEMOGLOBIN: 7.9 g/dL — AB (ref 12.0–15.0)
MCH: 28.1 pg (ref 26.0–34.0)
MCHC: 31.7 g/dL (ref 30.0–36.0)
MCV: 88.6 fL (ref 78.0–100.0)
Platelets: 133 10*3/uL — ABNORMAL LOW (ref 150–400)
RBC: 2.81 MIL/uL — AB (ref 3.87–5.11)
RDW: 13.7 % (ref 11.5–15.5)
WBC: 5.5 10*3/uL (ref 4.0–10.5)

## 2017-01-15 LAB — VAS US CAROTID
LCCADDIAS: -16 cm/s
LCCAPDIAS: -9 cm/s
LCCAPSYS: -210 cm/s
LEFT ECA DIAS: 0 cm/s
LEFT VERTEBRAL DIAS: 8 cm/s
LICADDIAS: -16 cm/s
LICADSYS: -95 cm/s
LICAPSYS: 111 cm/s
Left CCA dist sys: -184 cm/s
Left ICA prox dias: 17 cm/s
RCCADSYS: -95 cm/s
RCCAPSYS: -136 cm/s
RIGHT ECA DIAS: 0 cm/s
RIGHT VERTEBRAL DIAS: -18 cm/s
Right CCA prox dias: -7 cm/s

## 2017-01-15 LAB — BASIC METABOLIC PANEL
ANION GAP: 9 (ref 5–15)
BUN: 16 mg/dL (ref 6–20)
CALCIUM: 10.3 mg/dL (ref 8.9–10.3)
CO2: 30 mmol/L (ref 22–32)
Chloride: 110 mmol/L (ref 101–111)
Creatinine, Ser: 0.52 mg/dL (ref 0.44–1.00)
GFR calc non Af Amer: >60 mL/min (ref >60)
Glucose, Bld: 267 mg/dL — ABNORMAL HIGH (ref 65–99)
Potassium: 4 mmol/L (ref 3.5–5.1)
SODIUM: 149 mmol/L — AB (ref 135–145)

## 2017-01-15 LAB — GLUCOSE, CAPILLARY
GLUCOSE-CAPILLARY: 260 mg/dL — AB (ref 65–99)
Glucose-Capillary: 186 mg/dL — ABNORMAL HIGH (ref 65–99)
Glucose-Capillary: 228 mg/dL — ABNORMAL HIGH (ref 65–99)
Glucose-Capillary: 231 mg/dL — ABNORMAL HIGH (ref 65–99)
Glucose-Capillary: 257 mg/dL — ABNORMAL HIGH (ref 65–99)
Glucose-Capillary: 271 mg/dL — ABNORMAL HIGH (ref 65–99)

## 2017-01-15 LAB — IRON AND TIBC
Iron: 26 ug/dL — ABNORMAL LOW (ref 28–170)
SATURATION RATIOS: 13 % (ref 10.4–31.8)
TIBC: 206 ug/dL — AB (ref 250–450)
UIBC: 180 ug/dL

## 2017-01-15 LAB — MAGNESIUM: MAGNESIUM: 1.7 mg/dL (ref 1.7–2.4)

## 2017-01-15 LAB — RETICULOCYTES
RBC.: 2.82 MIL/uL — AB (ref 3.87–5.11)
RETIC CT PCT: 2.5 % (ref 0.4–3.1)
Retic Count, Absolute: 70.5 10*3/uL (ref 19.0–186.0)

## 2017-01-15 LAB — PROTIME-INR
INR: 1.18
PROTHROMBIN TIME: 15.1 s (ref 11.4–15.2)

## 2017-01-15 LAB — PHOSPHORUS: Phosphorus: 2 mg/dL — ABNORMAL LOW (ref 2.5–4.6)

## 2017-01-15 LAB — VITAMIN B12: VITAMIN B 12: 2410 pg/mL — AB (ref 180–914)

## 2017-01-15 LAB — APTT: APTT: 31 s (ref 24–36)

## 2017-01-15 LAB — FOLATE: Folate: 10.5 ng/mL (ref >5.9)

## 2017-01-15 LAB — FERRITIN: Ferritin: 95 ng/mL (ref 11–307)

## 2017-01-15 MED ORDER — ETOMIDATE 2 MG/ML IV SOLN: 40.0000 mg | Freq: Once | INTRAVENOUS | Status: DC

## 2017-01-15 MED ORDER — MIDAZOLAM HCL 2 MG/2ML IJ SOLN
4.0000 mg | Freq: Once | INTRAMUSCULAR | Status: AC
  Administered 2017-01-16 (×2): 2 mg via INTRAVENOUS
  Filled 2017-01-15: qty 4

## 2017-01-15 MED ORDER — MAGNESIUM SULFATE 2 GM/50ML IV SOLN
2.0000 g | Freq: Once | INTRAVENOUS | Status: AC
  Administered 2017-01-15: 2 g via INTRAVENOUS
  Filled 2017-01-15: qty 50

## 2017-01-15 MED ORDER — FENTANYL CITRATE (PF) 100 MCG/2ML IJ SOLN
200.0000 ug | Freq: Once | INTRAMUSCULAR | Status: AC
  Administered 2017-01-16 (×2): 100 ug via INTRAVENOUS
  Filled 2017-01-15: qty 4

## 2017-01-15 MED ORDER — PROPOFOL 500 MG/50ML IV EMUL
5.0000 ug/kg/min | Freq: Once | INTRAVENOUS | Status: AC
  Administered 2017-01-16: 25 ug/kg/min via INTRAVENOUS

## 2017-01-15 MED ORDER — VECURONIUM BROMIDE 10 MG IV SOLR
10.0000 mg | Freq: Once | INTRAVENOUS | Status: AC
  Administered 2017-01-16: 5 mg via INTRAVENOUS

## 2017-01-15 MED ORDER — SODIUM PHOSPHATES 45 MMOLE/15ML IV SOLN
10.0000 mmol | Freq: Once | INTRAVENOUS | Status: AC
  Administered 2017-01-15: 10 mmol via INTRAVENOUS
  Filled 2017-01-15: qty 3.33

## 2017-01-15 NOTE — Progress Notes (Signed)
PULMONARY / CRITICAL CARE MEDICINE   Name: April Franklin MRN: [id number] DOB: [birth year]    ADMISSION DATE:  01/09/2017 CONSULTATION DATE:  01/09/2017  REFERRING MD:  Dr. [name]   CHIEF COMPLAINT:  Basilar Artery Occlusion   HISTORY OF PRESENT ILLNESS:   76 y.o. female with known history of diabetes mellitus, colon cancer postresection, hypertension, and hyperlipidemia. Status post liver biopsy of hepatic masses with MRCP on 12/30/16.   On 2/15 was found to have a subacute infarct. MRI demonstrated extensive areas of infarct emerging from the basilar artery and right posterior communicating artery with occlusion. Patient underwent revascularization in interventional radiology on 2/16 and return to the ICU intubated.   SUBJECTIVE:  No change in neuro status. Remains off sedation. Weaning on vent 5/5  VITAL SIGNS: BP (!) 144/55   Pulse 75   Temp 98.5 F (36.9 C) (Axillary)   Resp 18   Ht 5\' 2"  (1.575 m)   Wt 67.2 kg (148 lb 2.4 oz)   SpO2 100%   BMI 27.10 kg/m   HEMODYNAMICS:    VENTILATOR SETTINGS: Vent Mode: CPAP;PSV FiO2 (%):  [30 %] 30 % Set Rate:  [18 bmp] 18 bmp Vt Set:  [400 mL] 400 mL PEEP:  [5 cmH20] 5 cmH20 Pressure Support:  [8 cmH20] 8 cmH20 Plateau Pressure:  [14 cmH20-15 cmH20] 15 cmH20  INTAKE / OUTPUT: I/O last 3 completed shifts: In: 2881.6 [I.V.:1696.6; NG/GT:1185] Out: 1695 [id number]  PHYSICAL EXAMINATION: General: Adult female, no distress, lying in bed  Neuro: Left gaze, pupils sluggish, +gag, does not open eyes, withdrawals to pain  HEENT: ETT in place    Cardiovascular: RRR, no MRG, NI S1/S2 Lungs: Clear breath sounds, non-labored  Abdomen: non-distended, active bowel sounds   Musculoskeletal: no acute   Skin: warm, dry, intact   LABS:  BMET  Recent Labs Lab 01/12/17 0244 01/13/17 0534 01/14/17 0306  NA 144 147* 150*  K 4.0 4.0 4.4  CL 110 111 115*  CO2 27 26 29   BUN 8 14 18   CREATININE 0.71 0.58 0.61  GLUCOSE 170* 192*  272*    Electrolytes  Recent Labs Lab 01/12/17 0244  01/12/17 1653 01/13/17 0534 01/13/17 1711 01/14/17 0306  CALCIUM 10.3  --   --  10.5*  --  10.2  MG 2.1  < > 1.9 1.8 1.7  --   PHOS 2.5  < > 2.7 2.5 1.9*  --   < > = values in this interval not displayed.  CBC  Recent Labs Lab 01/12/17 0244 01/13/17 0534 01/14/17 0306  WBC 7.3 5.9 4.4  HGB 9.5* 8.8* 7.7*  HCT 29.4* 27.2* 24.0*  PLT 156 148* 135*    Coag's  Recent Labs Lab 01/10/17 0103  APTT 27  INR 1.28    Sepsis Markers  Recent Labs Lab 01/09/17 1410 01/09/17 1646  LATICACIDVEN 0.96 1.29    ABG  Recent Labs Lab 01/10/17 0610  PHART 7.348*  PCO2ART 41.1  PO2ART 157*    Liver Enzymes  Recent Labs Lab 01/09/17 1356 01/10/17 0104 01/12/17 0244  AST 87* 79*  --   ALT 118* 117*  --   ALKPHOS 337* 341*  --   BILITOT 1.3* 1.1  --   ALBUMIN 3.3* 3.4* 2.9*    Cardiac Enzymes  Recent Labs Lab 01/10/17 0526 01/10/17 1117 01/10/17 1951  TROPONINI 1.24* 0.91* 0.92*    Glucose  Recent Labs Lab 01/14/17 [id number] 01/14/17 1607 01/14/17 1915 01/14/17 2332 01/15/17 0334 01/15/17 [id number]  GLUCAP 268* 180* 161* 223* 231* 257*    Imaging No results found.   STUDIES:  MRI/MRA HEAD 2/15: MRI HEAD: Acute extensive multi vascular territory supra and infratentorial infarcts. RIGHT petechial hemorrhage without hemorrhagic conversion. 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 Reconstitution of basilar tip and LEFT posterior cerebral artery via LEFT posterior communicating artery. CT HEAD W/O 2/16:  Evolving multifocal infarcts with worsening RIGHT cerebellar cytotoxic edema partially effacing the fourth ventricle. No obstructive hydrocephalus. Contrast staining and/or petechial hemorrhage RIGHT parieto-occipital lobe and RIGHT cerebellar infarcts without frank hemorrhagic conversion. BILATERAL LE VENOUS DUPLEX 2/16: Acute DVT involving the peroneal veins of  the right lower extremity. No evidence of superficial vein thrombosis of the right lower extremity. No evidence of deep or superficial vein thrombosis of the left lower extremity. PORT CXR 2/18:  Personally reviewed by me. No focal opacity or mass appreciated. No pleural effusion. Elevation of left hemidiaphragm. Endotracheal tube in good position. Enteric feeding tube coursing below diaphragm.  CULTURES: Urine Culture 2/10:  Multiple species present MRSA PCR 2/16:  Negative   ANTIBIOTICS: none  SIGNIFICANT EVENTS: 02/05 - MRCP with hepatic mass biopsies 02/15 - Admit 02/16 - basilar artery revascularization in interventional radiology  LINES/TUBES: ETT 2/16 >>  DISCUSSION: 76 y.o.  female with acute respiratory failure unable to protect her airway post basilar artery occlusion and revascularization on 2/16. Patient also with hepatic metastases consistent with adenocarcinoma on biopsy from 2/5. Patient's mental status and neurologic recovery seems stagnant and essentially nonexistent. Family understands this, but are praying for a miracle  ASSESSMENT / PLAN:  PULMONARY A: Acute Respiratory Failure secondary to CVA P:   Vent Support Wean as tolerated - mental status barrier to extubation  Family wants to [place] - DF consulted 2/21  CARDIOVASCULAR A:  H/O HTN, HLD P:  Holding home PO Cozaar Goal SBP <180   RENAL A:   Hypernatremia  P:  Trend BMP Free Water 200 ml TID Replace electrolytes as needed   GASTROINTESTINAL A:   No issues  P:   NPO PPI  HEMATOLOGIC A:   Metastatic Adenocarcinoma > Mets to Liver  Right Lower Extremity Calf DVT  Anemia  P:  Trend Hbg  Transfuse for Hbg <7 Holding Anticoagulation given low embolization risk and risk for hemorrhagic transformation   INFECTIOUS A:   No issues  P:   Trend WBC and Fever Curve   ENDOCRINE A:   DM  P:   SSI Q4H Glucose checks Continue Lantus 15 u at HS Holding home glipizide and metformin    NEUROLOGIC A:   Acute CVA secondary to basilar artery occulusion s/p revascularization  Acute encephalopathy/comatose status  P:   Per neurology  Hold sedating medications  RASS goal: 0/-1    FAMILY  - Updates: Spoke with husband this AM, he is plan is to trach/peg with the goal of being able to go home with hospice/home health and have as much time as he can - Inter-disciplinary family meet or Palliative Care meeting due by: Ongoing    Hayden Pedro, [place] [facility] [place]  Pgr: [phone]  PCCM Pgr: [phone]

## 2017-01-15 NOTE — Progress Notes (Signed)
STROKE TEAM PROGRESS NOTE   SUBJECTIVE (INTERVAL HISTORY) Neurologically unchanges and hemodynamically stable. No changes    OBJECTIVE Temp:  [98 F (36.7 C)-99.8 F (37.7 C)] 99.2 F (37.3 C) (02/21 1151) Pulse Rate:  [72-83] 75 (02/21 1151) Cardiac Rhythm: (P) Normal sinus rhythm (02/21 0900) Resp:  [16-27] 24 (02/21 1151) BP: (130-163)/(49-109) 133/54 (02/21 1151) SpO2:  [100 %] 100 % (02/21 1151) FiO2 (%):  [30 %] 30 % (02/21 1151) Weight:  [148 lb 2.4 oz (67.2 kg)] 148 lb 2.4 oz (67.2 kg) (02/21 0600)  CBC:   Recent Labs Lab 01/09/17 1356 01/10/17 0526  01/14/17 0306 01/15/17 0837  WBC 5.9 7.2  < > 4.4 5.5  NEUTROABS 4.0 5.5  --   --   --   HGB 10.6* 10.6*  < > 7.7* 7.9*  HCT 31.2* 31.8*  < > 24.0* 24.9*  MCV 83.6 84.4  < > 88.6 88.6  PLT 127* 152  < > 135* 133*  < > = values in this interval not displayed.  Basic Metabolic Panel:   Recent Labs Lab 01/13/17 1711 01/14/17 0306 01/15/17 0837  NA  --  150* 149*  K  --  4.4 4.0  CL  --  115* 110  CO2  --  29 30  GLUCOSE  --  272* 267*  BUN  --  18 16  CREATININE  --  0.61 0.52  CALCIUM  --  10.2 10.3  MG 1.7  --  1.7  PHOS 1.9*  --  2.0*     IMAGING Dg Chest Port 1 View  Result Date: 01/14/2017 CLINICAL DATA:  Respiratory failure. EXAM: PORTABLE CHEST 1 VIEW COMPARISON:  01/12/2017 FINDINGS: Endotracheal tube is 2.4 cm above the carina. Nasogastric tube extends into the abdomen. Both lungs are clear without airspace disease or pulmonary edema. Negative for a pneumothorax. Heart size is normal. Atherosclerotic calcifications at the aortic arch. IMPRESSION: No focal lung disease. Support apparatuses as described. Electronically Signed   By: Markus Daft M.D.   On: 01/14/2017 07:55     PHYSICAL EXAM General - cachectic, intubated not on sedation. No changes in exam today, stable.  Ophthalmologic - Fundi not visualized due to left gaze.  Cardiovascular - Regular rate and rhythm.  Neuro - intubated not on  sedation, does not open eye to sternal rub or pain, left gaze deviation does not cross midline, does not blink to threat, pupil 2.0 mm bilaterally, sluggish to light. Corneal sluggish on the right and preserved on the left. and gag cough positive. Facial symmetric.gimaces to pain.  . Mild withdrawal in all 4 extremities to pain. DTR diminished, toes equivocal. Sensation, coordination and gait not tested.   ASSESSMENT/PLAN April Franklin is a 76 y.o. female with history of colon cancer, metastatic pancreatobiliary adenocarcinoma, HTN, HLD and DM presenting with progressive altered mental state, HA and double vision. She did not receive IV t-PA due to delay in arrival.   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 Infarct embolic likely secondary to hypercoagulable state from metastatic colon cancer.  Resultant unresponsive, comatose state  CT head subacute infarct R parietal lobe, no frank hematoma   MRI  extensive multi vascular supra and infratentorial infarcts. Right petechial hemorrhage.  MRA  basilar artery and R PCA occlusion, I suspect this is chronic changes due to lack of brainstem and PCA ischemia.  Cerebral angiogram TICI 3 revascularization of occluded basilar artery  2D Echo  EF 60-65%   Carotid Doppler unremarkable   LE venous doppler  - right popliteal vein DVT  EEG diffuse slowing with superimposed left centrotemporal slowing   LDL 110  HgbA1c - 7.2  Lovenox 40 mg sq daily  for VTE prophylaxis Diet NPO time specified Diet NPO time specified  No antithrombotic prior to admission, now on aspirin 325 mg daily.   Neuro prognosis poor in lady with metastatic cancer. Family meeting tomorrow. Dr. [name] discussed with different daughter present today.  Therapy recommendations:  pending  Disposition:  Pending  Acute  respiratory failure   Intubated for airway protection   Unable to extubated d/t neuro status  CCM on board  Right LE DVT - likely due to hypercoagulable state secondary to metastatic colon cancer  Not candidate for anticoagulation due to large infarct with petechial hemorrhage  Aggressive treatment vs. IVC filter.  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 s/p liver bx 12/30/16  Likely the source of hypercoagulable state  Hypertension  SBP goal < 180  offf Cleviprex  Hyperlipidemia  Home meds:  No statin  LDL 110, goal < 70  No statin due to elevated LFTs and liver metastasis  Diabetes  HgbA1c - 7.2  goal < 7.0  Other Stroke Risk Factors  Advanced age  Other Active Problems  Encephalopathy d/t stroke  Elevated troponins - trending down, likely demand ischemia  Arthritis  Anemia - hemoglobin stable 8.8  Hypomagnesemia, resolved  Family meeting today at [facility] - palliative care and neuro to be present  Hospital day # [place] for Pager information 01/15/2017 12:53 PM  I have personally examined this patient, reviewed notes, independently viewed imaging studies, participated in medical decision making and plan of care.ROS completed by me personally and pertinent positives fully documented  I have made any additions or clarifications directly to the above note. Agree with note above.  I had a long  Discussion at the bedside with the patient's husband and  Daughter and discussed her prognosis and plan of care. Despite my stating clearly that her chances of survival are miniscule and quality of life likely terrible given her disabling stroke as well as recent diagnosis of metastatic adenocarcinoma family wishes to pursue  ongoing aggressive care including prolonged ventilatory support and want tracheostomy and PEG tube. D/w Dr Titus Mould and [place] Silos and trach and Peg scheduled for tomorrowThis patient is critically ill and at significant risk of neurological worsening, death and care requires constant monitoring of vital signs, hemodynamics,respiratory and cardiac monitoring, extensive review of multiple databases, frequent neurological assessment, discussion with family, other specialists and medical decision making of high complexity.I have made any additions or clarifications directly to the above note.This critical care time does not reflect procedure time, or teaching time or supervisory time of PA/NP/Med Resident etc but could involve care discussion time.  I spent 32 minutes of neurocritical care time  in the care of  this patient.     Antony Contras, MD Medical Director [facility] Stroke Center Pager: [phone] 01/15/2017 12:53 PM    To contact Stroke Continuity provider, please refer to [email or web address] After hours, contact General Neurology

## 2017-01-15 NOTE — Progress Notes (Signed)
SLP Cancellation Note  Patient Details Name: HANIN DEIDA MRN: [id number] DOB: November 10, 1941   Cancelled treatment:       Reason Eval/Treat Not Completed: Patient not medically ready. Pt remains intubated and nonresponsive. Per chart, plan is now to proceed with trach/PEG. SLP to sign off for now. Please re-order when more responsive.   Germain Osgood 01/15/2017, 8:47 AM  Germain Osgood, M.A. CCC-SLP [phone]

## 2017-01-15 NOTE — Progress Notes (Signed)
Inpatient Diabetes Program Recommendations  AACE/ADA: New Consensus Statement on Inpatient Glycemic Control (2015)  Target Ranges:  Prepandial:   less than 140 mg/dL      Peak postprandial:   less than 180 mg/dL (1-2 hours)      Critically ill patients:  140 - 180 mg/dL   Review of Glycemic Control  Diabetes history: DM 2 Outpatient Diabetes medications: Glipizide 10 mg Daily, Tresiba 20 units QHS, Tradjenta 5 mg Daily, Metformin 1000 mg Daily Current orders for Inpatient glycemic control: Lantus 15 units Novolog Moderate Q4 hours.  Inpatient Diabetes Program Recommendations:   If glucose remains elevated in the 200's after insulin dose adjustments. Please consider Novolog 5 units Tube Feed Coverage Q4 hours.  Thanks,  Tama Headings RN, MSN, [facility] Inpatient Diabetes Coordinator Team Pager [phone] (8a-5p)

## 2017-01-15 NOTE — Consult Note (Signed)
Reason for Consult: Need for enteral access  Referring Physician: Symphani Eckstrom is an 76 y.o. female.  HPI: [name] is admitted to the stroke service in the trauma neuro intensive care unit status post basilar artery occlusion with bilateral MCA CVA. She also has a history of colon cancer, status post partial colectomy by Dr. Neysa Bonito. Additionally, she has a history of metastatic pancreaticobiliary adenocarcinoma. Sherman's ventilator dependent and is undergoing tracheostomy tomorrow by critical care medicine. We are asked to place PEG. Her daughters are present at bedside and assists with the history.  Past Medical History:  Diagnosis Date  . Arthritis   . Colon cancer ([place])   . Diabetes mellitus   . Hyperlipidemia   . Hypertension   . UTI (urinary tract infection)     Past Surgical History:  Procedure Laterality Date  . ABDOMINAL HYSTERECTOMY    . APPENDECTOMY    . CATARACT EXTRACTION    . COLON RESECTION    . COLONOSCOPY    . FLEXIBLE SIGMOIDOSCOPY N/A 10/10/2015   Procedure: FLEXIBLE SIGMOIDOSCOPY;  Surgeon: Garlan Fair, MD;  Location: [facility];  Service: Endoscopy;  Laterality: N/A;  UNSEDATED  . IR GENERIC HISTORICAL  01/10/2017   IR PERCUTANEOUS ART THROMBECTOMY/INFUSION INTRACRANIAL INC DIAG ANGIO 01/10/2017 Luanne Bras, MD [facility]  . IR GENERIC HISTORICAL  01/10/2017   IR ANGIO VERTEBRAL SEL SUBCLAVIAN INNOMINATE UNI L MOD SED 01/10/2017 Luanne Bras, MD [facility]  . LEG SURGERY    . RADIOLOGY WITH ANESTHESIA N/A 01/10/2017   Procedure: RADIOLOGY WITH ANESTHESIA;  Surgeon: Medication Radiologist, MD;  Location: [facility];  Service: Radiology;  Laterality: N/A;    History reviewed. No pertinent family history.  Social History:  reports that she has never smoked. She has never used smokeless tobacco. She reports that she does not drink alcohol or use drugs.  Allergies: No Known Allergies  Medications: I have reviewed the  patient's current medications.  Results for orders placed or performed during the hospital encounter of 01/09/17 (from the past 48 hour(s))  Glucose, capillary     Status: Abnormal   Collection Time: 01/13/17 12:15 PM  Result Value Ref Range   Glucose-Capillary 264 (H) 65 - 99 mg/dL   Comment 1 Document in Chart   Glucose, capillary     Status: Abnormal   Collection Time: 01/13/17  3:53 PM  Result Value Ref Range   Glucose-Capillary 256 (H) 65 - 99 mg/dL  Magnesium     Status: None   Collection Time: 01/13/17  5:11 PM  Result Value Ref Range   Magnesium 1.7 1.7 - 2.4 mg/dL  Phosphorus     Status: Abnormal   Collection Time: 01/13/17  5:11 PM  Result Value Ref Range   Phosphorus 1.9 (L) 2.5 - 4.6 mg/dL  Glucose, capillary     Status: Abnormal   Collection Time: 01/13/17  7:11 PM  Result Value Ref Range   Glucose-Capillary 200 (H) 65 - 99 mg/dL  Glucose, capillary     Status: Abnormal   Collection Time: 01/13/17 11:10 PM  Result Value Ref Range   Glucose-Capillary 260 (H) 65 - 99 mg/dL  Glucose, capillary     Status: Abnormal   Collection Time: 01/14/17  3:05 AM  Result Value Ref Range   Glucose-Capillary 270 (H) 65 - 99 mg/dL  CBC     Status: Abnormal   Collection Time: 01/14/17  3:06 AM  Result Value Ref Range   WBC  4.4 4.0 - 10.5 K/uL   RBC 2.71 (L) 3.87 - 5.11 MIL/uL   Hemoglobin 7.7 (L) 12.0 - 15.0 g/dL   HCT 24.0 (L) 36.0 - 46.0 %   MCV 88.6 78.0 - 100.0 fL   MCH 28.4 26.0 - 34.0 pg   MCHC 32.1 30.0 - 36.0 g/dL   RDW 14.0 11.5 - 15.5 %   Platelets 135 (L) 150 - 400 K/uL  Basic metabolic panel     Status: Abnormal   Collection Time: 01/14/17  3:06 AM  Result Value Ref Range   Sodium 150 (H) 135 - 145 mmol/L   Potassium 4.4 3.5 - 5.1 mmol/L   Chloride 115 (H) 101 - 111 mmol/L   CO2 29 22 - 32 mmol/L   Glucose, Bld 272 (H) 65 - 99 mg/dL   BUN 18 6 - 20 mg/dL   Creatinine, Ser 0.61 0.44 - 1.00 mg/dL   Calcium 10.2 8.9 - 10.3 mg/dL   GFR calc non Af Amer >60 >60  mL/min   GFR calc Af Amer >60 >60 mL/min    Comment: (NOTE) The eGFR has been calculated using the CKD EPI equation. This calculation has not been validated in all clinical situations. eGFR's persistently <60 mL/min signify possible Chronic Kidney Disease.    Anion gap 6 5 - 15  Glucose, capillary     Status: Abnormal   Collection Time: 01/14/17  8:11 AM  Result Value Ref Range   Glucose-Capillary 268 (H) 65 - 99 mg/dL   Comment 1 Document in Chart   Glucose, capillary     Status: Abnormal   Collection Time: 01/14/17  4:07 PM  Result Value Ref Range   Glucose-Capillary 180 (H) 65 - 99 mg/dL  Glucose, capillary     Status: Abnormal   Collection Time: 01/14/17  7:15 PM  Result Value Ref Range   Glucose-Capillary 161 (H) 65 - 99 mg/dL  Glucose, capillary     Status: Abnormal   Collection Time: 01/14/17 11:32 PM  Result Value Ref Range   Glucose-Capillary 223 (H) 65 - 99 mg/dL  Glucose, capillary     Status: Abnormal   Collection Time: 01/15/17  3:34 AM  Result Value Ref Range   Glucose-Capillary 231 (H) 65 - 99 mg/dL  Vitamin B12     Status: Abnormal   Collection Time: 01/15/17  3:43 AM  Result Value Ref Range   Vitamin B-12 2,410 (H) 180 - 914 pg/mL    Comment: (NOTE) This assay is not validated for testing neonatal or myeloproliferative syndrome specimens for Vitamin B12 levels.   Folate     Status: None   Collection Time: 01/15/17  3:43 AM  Result Value Ref Range   Folate 10.5 >5.9 ng/mL  Iron and TIBC     Status: Abnormal   Collection Time: 01/15/17  3:43 AM  Result Value Ref Range   Iron 26 (L) 28 - 170 ug/dL   TIBC 206 (L) 250 - 450 ug/dL   Saturation Ratios 13 10.4 - 31.8 %   UIBC 180 ug/dL  Ferritin     Status: None   Collection Time: 01/15/17  3:43 AM  Result Value Ref Range   Ferritin 95 11 - 307 ng/mL  Reticulocytes     Status: Abnormal   Collection Time: 01/15/17  3:43 AM  Result Value Ref Range   Retic Ct Pct 2.5 0.4 - 3.1 %   RBC. 2.82 (L) 3.87 -  5.11 MIL/uL   Retic Count,  Manual 70.5 19.0 - 186.0 K/uL  Glucose, capillary     Status: Abnormal   Collection Time: 01/15/17  7:36 AM  Result Value Ref Range   Glucose-Capillary 257 (H) 65 - 99 mg/dL  Basic metabolic panel     Status: Abnormal   Collection Time: 01/15/17  8:37 AM  Result Value Ref Range   Sodium 149 (H) 135 - 145 mmol/L   Potassium 4.0 3.5 - 5.1 mmol/L   Chloride 110 101 - 111 mmol/L   CO2 30 22 - 32 mmol/L   Glucose, Bld 267 (H) 65 - 99 mg/dL   BUN 16 6 - 20 mg/dL   Creatinine, Ser 0.52 0.44 - 1.00 mg/dL   Calcium 10.3 8.9 - 10.3 mg/dL   GFR calc non Af Amer >60 >60 mL/min   GFR calc Af Amer >60 >60 mL/min    Comment: (NOTE) The eGFR has been calculated using the CKD EPI equation. This calculation has not been validated in all clinical situations. eGFR's persistently <60 mL/min signify possible Chronic Kidney Disease.    Anion gap 9 5 - 15  CBC     Status: Abnormal   Collection Time: 01/15/17  8:37 AM  Result Value Ref Range   WBC 5.5 4.0 - 10.5 K/uL   RBC 2.81 (L) 3.87 - 5.11 MIL/uL   Hemoglobin 7.9 (L) 12.0 - 15.0 g/dL   HCT 24.9 (L) 36.0 - 46.0 %   MCV 88.6 78.0 - 100.0 fL   MCH 28.1 26.0 - 34.0 pg   MCHC 31.7 30.0 - 36.0 g/dL   RDW 13.7 11.5 - 15.5 %   Platelets 133 (L) 150 - 400 K/uL  Magnesium     Status: None   Collection Time: 01/15/17  8:37 AM  Result Value Ref Range   Magnesium 1.7 1.7 - 2.4 mg/dL  Phosphorus     Status: Abnormal   Collection Time: 01/15/17  8:37 AM  Result Value Ref Range   Phosphorus 2.0 (L) 2.5 - 4.6 mg/dL    Dg Chest Port 1 View  Result Date: 01/14/2017 CLINICAL DATA:  Respiratory failure. EXAM: PORTABLE CHEST 1 VIEW COMPARISON:  01/12/2017 FINDINGS: Endotracheal tube is 2.4 cm above the carina. Nasogastric tube extends into the abdomen. Both lungs are clear without airspace disease or pulmonary edema. Negative for a pneumothorax. Heart size is normal. Atherosclerotic calcifications at the aortic arch. IMPRESSION: No  focal lung disease. Support apparatuses as described. Electronically Signed   By: Markus Daft M.D.   On: 01/14/2017 07:55    Review of Systems  Unable to perform ROS: Intubated   Blood pressure (!) 148/60, pulse 80, temperature 99.4 F (37.4 C), temperature source Axillary, resp. rate (!) 25, height 5' 2"  (1.575 m), weight 67.2 kg (148 lb 2.4 oz), SpO2 100 %. Physical Exam  Constitutional: She appears well-developed.  HENT:  Oral endotracheal tube, orogastric tube  Neck: Neck supple. No tracheal deviation present.  Cardiovascular: Normal rate and normal heart sounds.   Respiratory: Effort normal and breath sounds normal. No respiratory distress. She has no wheezes.  GI: Soft. She exhibits no distension. There is no tenderness. There is no rebound and no guarding.  Lower midline scar  Neurological:  Unresponsive to pain  Skin: Skin is warm.    Assessment/Plan: Status post basilar artery occlusion with bilateral MCA CVA - need for enteral access. Plan percutaneous endoscopic gastrostomy tube placement at the bedside tomorrow. We'll do in the afternoon as she is undergoing tracheostomy in  the morning. I discussed the procedure, risks, and benefits with her daughters and they are agreeable. I answered their questions.  Tarvares Lant E 01/15/2017, 10:15 AM

## 2017-01-16 ENCOUNTER — Inpatient Hospital Stay (HOSPITAL_COMMUNITY): Payer: Medicare Other

## 2017-01-16 ENCOUNTER — Encounter (HOSPITAL_COMMUNITY)
Admission: EM | Disposition: A | Discharge status: Home Health | Payer: Self-pay | Source: Home / Self Care | Attending: Neurology

## 2017-01-16 DIAGNOSIS — I6300 Cerebral infarction due to thrombosis of unspecified precerebral artery: Secondary | ICD-10-CM

## 2017-01-16 HISTORY — PX: PEG PLACEMENT: SHX5437

## 2017-01-16 HISTORY — PX: TRACHEOSTOMY: SUR1362

## 2017-01-16 HISTORY — PX: ESOPHAGOGASTRODUODENOSCOPY: SHX5428

## 2017-01-16 LAB — BASIC METABOLIC PANEL
ANION GAP: 8 (ref 5–15)
BUN: 14 mg/dL (ref 6–20)
CO2: 29 mmol/L (ref 22–32)
Calcium: 10.2 mg/dL (ref 8.9–10.3)
Chloride: 107 mmol/L (ref 101–111)
Creatinine, Ser: 0.54 mg/dL (ref 0.44–1.00)
GFR calc Af Amer: >60 mL/min (ref >60)
Glucose, Bld: 216 mg/dL — ABNORMAL HIGH (ref 65–99)
POTASSIUM: 3.5 mmol/L (ref 3.5–5.1)
SODIUM: 144 mmol/L (ref 135–145)

## 2017-01-16 LAB — CBC
HEMATOCRIT: 23.9 % — AB (ref 36.0–46.0)
HEMOGLOBIN: 7.7 g/dL — AB (ref 12.0–15.0)
MCH: 28.4 pg (ref 26.0–34.0)
MCHC: 32.2 g/dL (ref 30.0–36.0)
MCV: 88.2 fL (ref 78.0–100.0)
Platelets: 155 10*3/uL (ref 150–400)
RBC: 2.71 MIL/uL — AB (ref 3.87–5.11)
RDW: 13.5 % (ref 11.5–15.5)
WBC: 5.7 10*3/uL (ref 4.0–10.5)

## 2017-01-16 LAB — GLUCOSE, CAPILLARY
GLUCOSE-CAPILLARY: 205 mg/dL — AB (ref 65–99)
GLUCOSE-CAPILLARY: 143 mg/dL — AB (ref 65–99)
GLUCOSE-CAPILLARY: 251 mg/dL — AB (ref 65–99)
Glucose-Capillary: 141 mg/dL — ABNORMAL HIGH (ref 65–99)
Glucose-Capillary: 128 mg/dL — ABNORMAL HIGH (ref 65–99)
Glucose-Capillary: 176 mg/dL — ABNORMAL HIGH (ref 65–99)

## 2017-01-16 LAB — MAGNESIUM
Magnesium: 1.9 mg/dL (ref 1.7–2.4)
Magnesium: 1.9 mg/dL (ref 1.7–2.4)

## 2017-01-16 LAB — PHOSPHORUS
PHOSPHORUS: 2.4 mg/dL — AB (ref 2.5–4.6)
Phosphorus: 3.5 mg/dL (ref 2.5–4.6)

## 2017-01-16 SURGERY — EGD (ESOPHAGOGASTRODUODENOSCOPY)
Anesthesia: Moderate Sedation

## 2017-01-16 SURGERY — INSERTION, PEG TUBE
Anesthesia: Choice

## 2017-01-16 MED ORDER — MIDAZOLAM HCL 2 MG/2ML IJ SOLN
4.0000 mg | Freq: Once | INTRAMUSCULAR | Status: AC
  Administered 2017-01-16: 2 mg via INTRAVENOUS

## 2017-01-16 MED ORDER — VITAL HIGH PROTEIN PO LIQD: 1000.0000 mL | ORAL | Status: DC

## 2017-01-16 MED ORDER — VECURONIUM BROMIDE 10 MG IV SOLR
10.0000 mg | Freq: Once | INTRAVENOUS | Status: AC
  Administered 2017-01-16: 10 mg via INTRAVENOUS

## 2017-01-16 MED ORDER — MIDAZOLAM HCL 2 MG/2ML IJ SOLN
INTRAMUSCULAR | Status: AC
  Administered 2017-01-16: 2 mg via INTRAVENOUS
  Filled 2017-01-16: qty 2

## 2017-01-16 MED ORDER — VECURONIUM BROMIDE 10 MG IV SOLR
INTRAVENOUS | Status: AC
  Administered 2017-01-16: 10 mg via INTRAVENOUS
  Filled 2017-01-16: qty 10

## 2017-01-16 MED ORDER — MIDAZOLAM HCL 2 MG/2ML IJ SOLN
INTRAMUSCULAR | Status: AC
  Filled 2017-01-16: qty 2

## 2017-01-16 MED ORDER — PRO-STAT SUGAR FREE PO LIQD
30.0000 mL | Freq: Two times a day (BID) | ORAL | Status: DC
  Administered 2017-01-16 – 2017-01-20 (×9): 30 mL
  Filled 2017-01-16 (×9): qty 30

## 2017-01-16 MED ORDER — FENTANYL CITRATE (PF) 100 MCG/2ML IJ SOLN
100.0000 ug | Freq: Once | INTRAMUSCULAR | Status: AC
  Administered 2017-01-16: 100 ug via INTRAVENOUS

## 2017-01-16 MED ORDER — PROPOFOL BOLUS VIA INFUSION
60.0000 mg | Freq: Once | INTRAVENOUS | Status: AC
  Administered 2017-01-16: 60 mg via INTRAVENOUS

## 2017-01-16 MED ORDER — FENTANYL CITRATE (PF) 100 MCG/2ML IJ SOLN
25.0000 ug | INTRAMUSCULAR | Status: AC | PRN
  Administered 2017-01-16: 50 ug via INTRAVENOUS
  Filled 2017-01-16: qty 2

## 2017-01-16 NOTE — Progress Notes (Signed)
PULMONARY / CRITICAL CARE MEDICINE   Name: April Franklin MRN: [id number] DOB: [birth year]    ADMISSION DATE:  01/09/2017 CONSULTATION DATE:  01/09/2017  REFERRING MD:  Dr. [name]   CHIEF COMPLAINT:  Basilar Artery Occlusion   brief  76 y.o. female with known history of diabetes mellitus, colon cancer postresection, hypertension, and hyperlipidemia. Status post liver biopsy of hepatic masses with MRCP on 12/30/16.   On 2/15 was found to have a subacute infarct. MRI demonstrated extensive areas of infarct emerging from the basilar artery and right posterior communicating artery with occlusion. Patient underwent revascularization in interventional radiology on 2/16 and return to the ICU intubated.   SIGNIFICANT EVENTS: 02/05 - MRCP with hepatic mass biopsies 02/15 - Admit 02/16 - basilar artery revascularization in interventional radiology ETT 2/16 >> 2/21: No change in neuro status. Remains off sedation. Weaning on vent 5/5     SUBJECTIVE/OVERNIGHT/INTERVAL HX 2/22 - for palliative trach today as destination to go home with  Home hospice. sHe is DNR if arrests   VITAL SIGNS: BP (!) 151/68   Pulse 74   Temp 98.7 F (37.1 C) (Oral)   Resp (!) 21   Ht 5\' 2"  (1.575 m)   Wt 67.6 kg (149 lb 0.5 oz)   SpO2 100%   BMI 27.26 kg/m   HEMODYNAMICS:    VENTILATOR SETTINGS: Vent Mode: PRVC FiO2 (%):  [30 %] 30 % Set Rate:  [18 bmp] 18 bmp Vt Set:  [400 mL] 400 mL PEEP:  [5 cmH20] 5 cmH20 Pressure Support:  [8 cmH20] 8 cmH20 Plateau Pressure:  [12 cmH20-14 cmH20] 14 cmH20  INTAKE / OUTPUT: I/O last 3 completed shifts: In: 3290 [I.V.:360; NG/GT:2680; IV Piggyback:250] Out: [id number] [Urine:1650]  PHYSICAL EXAMINATION:   General Appearance:    Looks criticall ill OBESE - no  Head:    Normocephalic, without obvious abnormality, atraumatic  Eyes:    PERRL - yes, conjunctiva/corneas - clear      Ears:    Normal external ear canals, both ears  Nose:   NG tube - na  Throat:  ETT  TUBE - yes , OG tube - yes  Neck:   Supple,  No enlargement/tenderness/nodules     Lungs:     Clear to auscultation bilaterally, Ventilator   Synchrony - yes  Chest wall:    No deformity  Heart:    S1 and S2 normal, no murmur, CVP - na.  Pressors - none  Abdomen:     Soft, no masses, no organomegaly  Genitalia:    Not done  Rectal:   not done  Extremities:   Extremities- no edema     Skin:   Intact in exposed areas . Sacral area - not known for 01/16/2017      Neurologic:   Sedation - none -> RASS - -4 .      LABS: PULMONARY  Recent Labs Lab 01/10/17 0610  PHART 7.348*  PCO2ART 41.1  PO2ART 157*  HCO3 22.0  O2SAT 98.8    CBC  Recent Labs Lab 01/14/17 0306 01/15/17 0837 01/16/17 0334  HGB 7.7* 7.9* 7.7*  HCT 24.0* 24.9* 23.9*  WBC 4.4 5.5 5.7  PLT 135* 133* 155    COAGULATION  Recent Labs Lab 01/10/17 0103 01/15/17 1525  INR 1.28 1.18    CARDIAC   Recent Labs Lab 01/09/17 1821 01/10/17 0526 01/10/17 1117 01/10/17 1951  TROPONINI 1.10* 1.24* 0.91* 0.92*   No results for input(s): PROBNP in the last  168 hours.   CHEMISTRY  Recent Labs Lab 01/12/17 0244  01/12/17 1653 01/13/17 0534 01/13/17 1711 01/14/17 0306 01/15/17 0837 01/16/17 0334  NA 144  --   --  147*  --  150* 149* 144  K 4.0  --   --  4.0  --  4.4 4.0 3.5  CL 110  --   --  111  --  115* 110 107  CO2 27  --   --  26  --  29 30 29   GLUCOSE 170*  --   --  192*  --  272* 267* 216*  BUN 8  --   --  14  --  18 16 14   CREATININE 0.71  --   --  0.58  --  0.61 0.52 0.54  CALCIUM 10.3  --   --  10.5*  --  10.2 10.3 10.2  MG 2.1  < > 1.9 1.8 1.7  --  1.7 1.9  PHOS 2.5  < > 2.7 2.5 1.9*  --  2.0* 2.4*  < > = values in this interval not displayed. Estimated Creatinine Clearance: 55.6 mL/min (by C-G formula based on SCr of 0.54 mg/dL).   LIVER  Recent Labs Lab 01/09/17 1356 01/10/17 0103 01/10/17 0104 01/12/17 0244 01/15/17 1525  AST 87*  --  79*  --   --   ALT 118*  --   117*  --   --   ALKPHOS 337*  --  341*  --   --   BILITOT 1.3*  --  1.1  --   --   PROT 6.2*  --  6.5  --   --   ALBUMIN 3.3*  --  3.4* 2.9*  --   INR  --  1.28  --   --  1.18     INFECTIOUS  Recent Labs Lab 01/09/17 1410 01/09/17 1646  LATICACIDVEN 0.96 1.29     ENDOCRINE CBG (last 3)   Recent Labs  01/15/17 2345 01/16/17 0306 01/16/17 0842  GLUCAP 271* 205* 141*         IMAGING x48h  - image(s) personally visualized  -   highlighted in bold Dg Chest Port 1 View  Result Date: 01/16/2017 CLINICAL DATA:  Intubation. EXAM: PORTABLE CHEST 1 VIEW COMPARISON:  01/14/2017 . FINDINGS: Endotracheal tube and NG tube in stable position. Heart size stable. Normal pulmonary vascularity. No focal infiltrate. No pleural effusion or pneumothorax. IMPRESSION: 1. Lines and tubes in stable position. 2.  No acute cardiopulmonary disease P Electronically Signed   By: Marcello Moores  Register   On: 01/16/2017 07:24       STUDIES:  MRI/MRA HEAD 2/15: MRI HEAD: Acute extensive multi vascular territory supra and infratentorial infarcts. RIGHT petechial hemorrhage without hemorrhagic conversion. 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 Reconstitution of basilar tip and LEFT posterior cerebral artery via LEFT posterior communicating artery. CT HEAD W/O 2/16:  Evolving multifocal infarcts with worsening RIGHT cerebellar cytotoxic edema partially effacing the fourth ventricle. No obstructive hydrocephalus. Contrast staining and/or petechial hemorrhage RIGHT parieto-occipital lobe and RIGHT cerebellar infarcts without frank hemorrhagic conversion. BILATERAL LE VENOUS DUPLEX 2/16: Acute DVT involving the peroneal veins of the right lower extremity. No evidence of superficial vein thrombosis of the right lower extremity. No evidence of deep or superficial vein thrombosis of the left lower extremity. PORT CXR 2/18:  Personally reviewed by me. No focal opacity or mass  appreciated. No pleural effusion. Elevation of left hemidiaphragm.  Endotracheal tube in good position. Enteric feeding tube coursing below diaphragm.  CULTURES: Urine Culture 2/10:  Multiple species present MRSA PCR 2/16:  Negative   ANTIBIOTICS: none  Patient Active Problem List   Diagnosis Date Noted  . Respiratory failure ([place])   . Cerebral embolism with cerebral infarction 01/10/2017  . Acute encephalopathy 01/09/2017  . Elevated troponin 01/09/2017  . Type 2 diabetes mellitus with hyperglycemia, with long-term current use of insulin ([place]) 01/09/2017  . Hypercalcemia 01/09/2017  . Nonintractable headache   . Hypertension 02/21/2012  . Diabetes mellitus 02/21/2012  . Colon cancer ([place]) 02/21/2012     DISCUSSION: 76 y.o.  female with acute respiratory failure unable to protect her airway post basilar artery occlusion and revascularization on 2/16. Patient also with hepatic metastases consistent with adenocarcinoma on biopsy from 2/5. Patient's mental status and neurologic recovery seems stagnant and essentially nonexistent. 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  Right Lower Extremity Calf DVT  Anemia  P:  Trend Hbg  Transfuse for Hbg <7 Holding Anticoagulation given low embolization risk and risk for hemorrhagic transformation   INFECTIOUS A:   No issues  P:   Trend WBC and Fever Curve   ENDOCRINE A:   DM  P:   SSI Q4H Glucose  checks Continue Lantus 15 u at HS Holding home glipizide and metformin   NEUROLOGIC A:   Acute CVA secondary to basilar artery occulusion s/p revascularization  Acute encephalopathy/comatose status  P:   Per neurology  Hold sedating medications  RASS goal: 0/-1    FAMILY  - Updates: APP d/w husband this AM 2/21, he is plan is to trach/peg with the goal of being able to go home with hospice/home health and have as much time as he can - Inter-disciplinary family meet or Palliative Care meeting due by: Ongoing     Dr. Brand Males, M.D., [facility].C.P Pulmonary and Critical Care Medicine Staff Physician [place] [facility] Pager: [phone], If no answer or between  15:00h - 7:00h: call [phone]  01/16/2017 10:32 AM

## 2017-01-16 NOTE — Op Note (Signed)
   April Franklin  Date of admit: 01/09/2017  LOS: 7 days  Date of Procedure: 01/16/2017            PROCEDURE  NOTE   FLEXIBLE VIDEO BRONCHOSCOPY FOR PERCUTANEOUS DILATATIONAL TRACHEOSTOMY     OPERATOR  1. Dr. Brand Males - Flexible Bronchoscopy  2. Dr Merrie Roof - Percutaneous Dilatational Tracheostomy    RISKS  Risks of pneumothorax, hemothorax, sedation/anesthesia complications such as cardiac or respiratory arrest or hypotension, stroke and bleeding all explained. Benefits of diagnosis but limitations of non-diagnosis also explained. Patient/family verbalized understanding and wished to proceed.   CONSENT  Signed informed consent from family per Dr Titus Mould    PROCEDURE DETAILS  Procedure done by Dr Chase Caller on 01/16/2017  After ensuring adequate anesthesia, at first bronch was introduce through ET tube and structures of tracheal rings, carina identified for operator of tracheostomy listed above. Titus Mould. Light of bronch passed through trachea and skin for indentification of tracheal rings for tracheostomy puncture. After this, under bronchoscopy guidance, ET tube was pulled back sufficiently and very carefully. The ET tube was pulled back enough to give room for tracheostomy operator and yet at same time to to ensure a secured airway. After this was accomplished, bronchoscope was withdrawn into the ET tube. After this, The operator of tracheostomy listed above, performed tracheostomy under visual provided by flexible video bronchoscopy. Followng introduction of tracheostomy, the bronchoscope was removed from ET tube and introduced through tracheostomy. Correct position of tracheostomy was ensured, with enough room between carina and distal tracheostomy and no evidence of bleeding. The bronchoscope was then withdrawn. Respiratory therapist was then instructed to remove the ET tube. Tracheostomy operator listed above then proceeded to complete the tracheostomy with stay  sutures   COMPLICATIONS  No complications     Dr. Brand Males, M.D., [facility].C.P  Pulmonary and Critical Care Medicine  Staff Physician  [place] [facility]  Pager: [phone], If no answer or between 15:00h - 7:00h: call [phone]  01/16/2017 and 10:33 AM

## 2017-01-16 NOTE — Progress Notes (Signed)
STROKE TEAM PROGRESS NOTE   SUBJECTIVE (INTERVAL HISTORY) Patient remains comatose without any neurological improvement.Marland Kitchen Continues to wean per RN. Son at bedside, Dr. [name] spoke with him about plan of care.   OBJECTIVE Temp:  [98.7 F (37.1 C)-99.2 F (37.3 C)] 98.7 F (37.1 C) (02/22 0742) Pulse Rate:  [69-85] 70 (02/22 0742) Cardiac Rhythm: Normal sinus rhythm (02/22 0400) Resp:  [18-26] 18 (02/22 0742) BP: (102-163)/(52-78) 147/62 (02/22 0742) SpO2:  [100 %] 100 % (02/22 0742) FiO2 (%):  [30 %] 30 % (02/22 0742) Weight:  [67.6 kg (149 lb 0.5 oz)] 67.6 kg (149 lb 0.5 oz) (02/22 0600)  CBC:   Recent Labs Lab 01/09/17 1356 01/10/17 0526  01/15/17 0837 01/16/17 0334  WBC 5.9 7.2  < > 5.5 5.7  NEUTROABS 4.0 5.5  --   --   --   HGB 10.6* 10.6*  < > 7.9* 7.7*  HCT 31.2* 31.8*  < > 24.9* 23.9*  MCV 83.6 84.4  < > 88.6 88.2  PLT 127* 152  < > 133* 155  < > = values in this interval not displayed.  Basic Metabolic Panel:   Recent Labs Lab 01/15/17 0837 01/16/17 0334  NA 149* 144  K 4.0 3.5  CL 110 107  CO2 30 29  GLUCOSE 267* 216*  BUN 16 14  CREATININE 0.52 0.54  CALCIUM 10.3 10.2  MG 1.7 1.9  PHOS 2.0* 2.4*     IMAGING Dg Chest Port 1 View  Result Date: 01/16/2017 CLINICAL DATA:  Intubation. EXAM: PORTABLE CHEST 1 VIEW COMPARISON:  01/14/2017 . FINDINGS: Endotracheal tube and NG tube in stable position. Heart size stable. Normal pulmonary vascularity. No focal infiltrate. No pleural effusion or pneumothorax. IMPRESSION: 1. Lines and tubes in stable position. 2.  No acute cardiopulmonary disease P Electronically Signed   By: Marcello Moores  Register   On: 01/16/2017 07:24     PHYSICAL EXAM General - cachectic, intubated not on sedation. No changes in exam today, stable.  Ophthalmologic - Fundi not visualized due to left gaze.  Cardiovascular - Regular rate and rhythm.  Neuro - intubated not on sedation, does not open eye to sternal rub or pain, left gaze  deviation does not cross midline, does not blink to threat, pupil 2.0 mm bilaterally, sluggish to light. Corneal reflexes preserved bilaterallyt. and gag cough positive. Facial symmetric.gimaces to pain.  . Mild withdrawal in all 4 extremities to pain. DTR diminished, toes equivocal. Sensation, coordination and gait not tested.   ASSESSMENT/PLAN Ms. CORLETTE DENONCOURT is a 76 y.o. female with history of colon cancer, metastatic pancreatobiliary adenocarcinoma, HTN, HLD and DM presenting with progressive altered mental state, HA and double vision. She did not receive IV t-PA due to delay in arrival.   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 Infarct embolic likely secondary to hypercoagulable state from metastatic colon cancer.  Resultant unresponsive, comatose state  CT head subacute infarct R parietal lobe, no frank hematoma   MRI  extensive multi vascular supra and infratentorial infarcts. Right petechial hemorrhage.  MRA  basilar artery and R PCA occlusion, I suspect this is chronic changes due to lack of brainstem and PCA ischemia.   Cerebral angiogram TICI 3 revascularization of occluded basilar artery  2D Echo  EF 60-65%   Carotid Doppler unremarkable   LE venous doppler  - right popliteal vein DVT  EEG diffuse  slowing with superimposed left centrotemporal slowing   LDL 110  HgbA1c - 7.2  Lovenox 40 mg sq daily  for VTE prophylaxis Diet NPO time specified  No antithrombotic prior to admission, now on aspirin 325 mg daily.   Neuro prognosis poor in lady with metastatic cancer. Family meeting tomorrow. Dr. [name] discussed with different daughter present today.  Therapy recommendations:  pending  Disposition:  Pending  Acute respiratory failure secondary to stroke  Intubated for airway protection   Unable to extubated d/t  comatose state and brainstem stroke  For trach placement today  Weaning continues  CCM on board  Right LE DVT - likely due to hypercoagulable state secondary to metastatic colon cancer  Not candidate for anticoagulation due to large infarct with petechial hemorrhage  Consider anticoagulation once PEG and trach placed  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 s/p liver bx 12/30/16  Likely the source of hypercoagulable state  Dysphagia  Secondary to stroke  Unable to recover swallow at this time  For PEG placement today  Hypertension  SBP goal < 180  offf Cleviprex, at goal  Hyperlipidemia  Home meds:  No statin  LDL 110, goal < 70  No statin due to elevated LFTs and liver metastasis  Diabetes  HgbA1c - 7.2  goal < 7.0  On lantus  SSI  Other Stroke Risk Factors  Advanced age  Other Active Problems  Encephalopathy d/t stroke  Elevated troponins - trending down, likely demand ischemia  Arthritis  Anemia - hemoglobin stable 7.7  Hypomagnesemia, resolved  hypernatremia  Hospital day # [place] for Pager information 01/16/2017 8:51 AM  Plan tracheostomy and PEG tube today. Hopefully will try to wean off ventilator after tracheostomy over the next few days and transfer  to skilled nursing facility early next week. If unable to wean off ventilatory support may need transfer to a ventilator nursing home which may end up being out of state. Family has been clearly notified about this and they agree with the plan Discussed with son at the bedside and answered questions. This patient is critically ill and at significant risk of neurological worsening, death and care requires constant monitoring of vital signs, hemodynamics,respiratory and  cardiac monitoring, extensive review of multiple databases, frequent neurological assessment, discussion with family, other specialists and medical decision making of high complexity.I have made any additions or clarifications directly to the above note.This critical care time does not reflect procedure time, or teaching time or supervisory time of PA/NP/Med Resident etc but could involve care discussion time.  I spent 30 minutes of neurocritical care time  in the care of  this patient.      To contact Stroke Continuity provider, please refer to [email or web address] After hours, contact General Neurology

## 2017-01-16 NOTE — Procedures (Signed)
Name:  SONA PERONE MRN:  [id number] DOB:  Apr 19, 1941  OPERATIVE NOTE  Procedure:  Percutaneous tracheostomy.  Indications:  Ventilator-dependent respiratory failure.  Consent:  Procedure, alternatives, risks and benefits discussed with medical POA.  Questions answered.  Consent obtained.  Anesthesia:  Versed, fent, prop, vec  Procedure summary:  Appropriate equipment was assembled.  The patient was identified as April Franklin and safety timeout was performed. The patient was placed in supine position with a towel roll behind shoulder blades and neck extended.  Sterile technique was used. The patient's neck and upper chest were prepped using chlorhexidine / alcohol scrub and the field was draped in usual sterile fashion with full body drape. After the adequate sedation / anesthesia was achieved, attention was directed at the midline trachea, where the cricothyroid membrane was palpated. Approximately two fingerbreadths above the sternal notch, a verticle incision was created with a scalpel after local infiltration with 0.2% Lidocaine. Then, using Seldinger technique and a percutaneous tracheostomy set, the trachea was entered with a 14 gauge needle with an overlying sheath. This was all confirmed under direct visualization of a fiberoptic flexible bronchoscope. Entrance into the trachea was identified through the third tracheal ring interspace. Following this, a guidewire was inserted. The needle was removed, leaving the sheath and the guidewire intact. Next, the sheath was removed and a small dilator was inserted. The tracheal rings were then dilated. A #6 Shiley was then opened. The balloon was checked. It was placed over a tracheal dilator, which was then advanced over the guidewire and through the previously dilated tract. The Shiley tracheostomy tube was noted to pass in the trachea with little resistance. The guidewire and dilator tubes were removed from the trachea. An inner cannula was  placed through the tracheostomy tube. The tracheostomy was then secured at the anterior neck with 4 monofilament sutures. The oral endotracheal tube was removed and the ventilator was attached to the newly placed tracheostomy tube. Adequate tidal volumes were noted. The cuff was inflated and no evidence of air leak was noted. No evidence of bleeding was noted. At this point, the procedure was concluded. Post-procedure chest x-ray was ordered.  Complications:  No immediate complications were noted.  Hemodynamic parameters and oxygenation remained stable throughout the procedure.  Estimated blood loss:  Less then 3 mL.  Raylene Miyamoto., MD Pulmonary and [place] Pager: [phone]  01/16/2017, 10:36 AM   Needs to follo wup in [facility] clinic [phone]  .

## 2017-01-17 DIAGNOSIS — C787 Secondary malignant neoplasm of liver and intrahepatic bile duct: Secondary | ICD-10-CM

## 2017-01-17 DIAGNOSIS — D649 Anemia, unspecified: Secondary | ICD-10-CM | POA: Diagnosis not present

## 2017-01-17 DIAGNOSIS — J9620 Acute and chronic respiratory failure, unspecified whether with hypoxia or hypercapnia: Secondary | ICD-10-CM

## 2017-01-17 LAB — BASIC METABOLIC PANEL
Anion gap: 6 (ref 5–15)
BUN: 16 mg/dL (ref 6–20)
CHLORIDE: 107 mmol/L (ref 101–111)
CO2: 29 mmol/L (ref 22–32)
Calcium: 10.2 mg/dL (ref 8.9–10.3)
Creatinine, Ser: 0.5 mg/dL (ref 0.44–1.00)
GFR calc Af Amer: >60 mL/min (ref >60)
GFR calc non Af Amer: >60 mL/min (ref >60)
GLUCOSE: 248 mg/dL — AB (ref 65–99)
POTASSIUM: 3.3 mmol/L — AB (ref 3.5–5.1)
Sodium: 142 mmol/L (ref 135–145)

## 2017-01-17 LAB — GLUCOSE, CAPILLARY
GLUCOSE-CAPILLARY: 220 mg/dL — AB (ref 65–99)
GLUCOSE-CAPILLARY: 252 mg/dL — AB (ref 65–99)
Glucose-Capillary: 229 mg/dL — ABNORMAL HIGH (ref 65–99)
Glucose-Capillary: 246 mg/dL — ABNORMAL HIGH (ref 65–99)
Glucose-Capillary: 255 mg/dL — ABNORMAL HIGH (ref 65–99)

## 2017-01-17 LAB — CBC
HEMATOCRIT: 25 % — AB (ref 36.0–46.0)
Hemoglobin: 7.9 g/dL — ABNORMAL LOW (ref 12.0–15.0)
MCH: 28 pg (ref 26.0–34.0)
MCHC: 31.6 g/dL (ref 30.0–36.0)
MCV: 88.7 fL (ref 78.0–100.0)
Platelets: 180 10*3/uL (ref 150–400)
RBC: 2.82 MIL/uL — ABNORMAL LOW (ref 3.87–5.11)
RDW: 13.7 % (ref 11.5–15.5)
WBC: 6.7 10*3/uL (ref 4.0–10.5)

## 2017-01-17 LAB — PHOSPHORUS
Phosphorus: 3.1 mg/dL (ref 2.5–4.6)
Phosphorus: 2.7 mg/dL (ref 2.5–4.6)

## 2017-01-17 LAB — MAGNESIUM
Magnesium: 1.7 mg/dL (ref 1.7–2.4)
Magnesium: 2.5 mg/dL — ABNORMAL HIGH (ref 1.7–2.4)

## 2017-01-17 MED ORDER — CHLORHEXIDINE GLUCONATE 0.12 % MT SOLN
OROMUCOSAL | Status: AC
  Administered 2017-01-17: 21:00:00
  Filled 2017-01-17: qty 15

## 2017-01-17 MED ORDER — MAGNESIUM SULFATE 2 GM/50ML IV SOLN
2.0000 g | Freq: Once | INTRAVENOUS | Status: AC
  Administered 2017-01-17: 2 g via INTRAVENOUS
  Filled 2017-01-17: qty 50

## 2017-01-17 MED ORDER — PANTOPRAZOLE SODIUM 40 MG PO TBEC
40.0000 mg | DELAYED_RELEASE_TABLET | Freq: Every day | ORAL | Status: DC
  Administered 2017-01-18 – 2017-01-19 (×2): 40 mg via ORAL
  Filled 2017-01-17 (×2): qty 1

## 2017-01-17 NOTE — Assessment & Plan Note (Signed)
Poor prognosis DNR but s/p trach and peg and want to take patient home

## 2017-01-17 NOTE — Assessment & Plan Note (Signed)
ssi 

## 2017-01-17 NOTE — Progress Notes (Signed)
Pt taken off ventilator and placed on 6L/28% Trach Collar. Pt VS within normal limits. Pt in no distress. RN aware. RT will continue to monitor.

## 2017-01-17 NOTE — Progress Notes (Signed)
Patient ID: April Franklin, female   DOB: 08-Feb-1941, 76 y.o.   MRN: [id number] PEG site good Abd benign Maintain binder loosely to protect PEG Trauma will S.O. I spoke with her daughter.  Georganna Skeans, MD, MPH, FACS Trauma: [phone] General Surgery: [phone]

## 2017-01-17 NOTE — Progress Notes (Signed)
Results for April Franklin, April Franklin (MRN [id number]) as of 01/17/2017 08:12  Ref. Range 01/16/2017 15:40 01/16/2017 19:50 01/16/2017 23:17 01/17/2017 03:09 01/17/2017 08:03  Glucose-Capillary Latest Ref Range: 65 - 99 mg/dL 128 (H) 176 (H) 251 (H) 229 (H) 246 (H)  Noted that blood sugars have been greater than 180 mg/dl. Recommend increasing Lantus to 20 units daily. Will continue to monitor blood sugars while in the hospital. Harvel Ricks RN BSN CDE

## 2017-01-17 NOTE — Assessment & Plan Note (Signed)
  Recent Labs Lab 01/15/17 0837 01/16/17 0334 01/17/17 0413  HGB 7.9* 7.7* 7.9*  HCT 24.9* 23.9* 25.0*  WBC 5.5 5.7 6.7  PLT 133* 155 180   - PRBC for hgb </= 6.9gm%    - exceptions are   -  if ACS susepcted/confirmed then transfuse for hgb </= 8.0gm%,  or    -  active bleeding with hemodynamic instability, then transfuse regardless of hemoglobin value   At at all times try to transfuse 1 unit prbc as possible with exception of active hemorrhage

## 2017-01-17 NOTE — Progress Notes (Signed)
Met with pt's son, daughter, and niece to discuss discharge planning.  Per MD notes, family wishes to go home with Hospice services.  Family adamantly states that they DO NOT WANT HOSPICE services for pt; they prefer pt go home with [facility] provided by [place].  Niece April is a Equities trader, and there are multiple family members who work in the Physicist, medical.  They understand that there are many variables that need to be determined prior to making dc plans; pt just starting weaning trials today.  Will follow pt/ family for home with vent vs trach collar.  Family aware there will need to be two primary care givers for pt, and state there will likely be more that will need training.  Medicaid application provided, per niece's request.  They are appreciative of my visit.    Reinaldo Raddle, RN, BSN  Trauma/Neuro ICU Case Manager [phone]

## 2017-01-17 NOTE — Assessment & Plan Note (Signed)
Replete 2gm

## 2017-01-17 NOTE — Progress Notes (Signed)
STROKE TEAM PROGRESS NOTE   SUBJECTIVE (INTERVAL HISTORY) Patient d ad trach and PEG yesterday and is currently weaning on the ventilator remains comatose without any neurological improvement.. I spoke to her daughter at the bedside and updated her OBJECTIVE Temp:  [97.7 F (36.5 C)-100.4 F (38 C)] 100.4 F (38 C) (02/23 0751) Pulse Rate:  [64-95] 93 (02/23 0900) Cardiac Rhythm: Normal sinus rhythm (02/23 0800) Resp:  [14-26] 26 (02/23 0900) BP: (108-177)/(42-82) 140/60 (02/23 0900) SpO2:  [100 %] 100 % (02/23 0900) FiO2 (%):  [30 %-100 %] 30 % (02/23 0900) Weight:  [147 lb 11.3 oz (67 kg)] 147 lb 11.3 oz (67 kg) (02/23 0500)  CBC:   Recent Labs Lab 01/16/17 0334 01/17/17 0413  WBC 5.7 6.7  HGB 7.7* 7.9*  HCT 23.9* 25.0*  MCV 88.2 88.7  PLT 155 [id number]    Basic Metabolic Panel:   Recent Labs Lab 01/16/17 0334 01/16/17 1412 01/17/17 0413  NA 144  --  142  K 3.5  --  3.3*  CL 107  --  107  CO2 29  --  29  GLUCOSE 216*  --  248*  BUN 14  --  16  CREATININE 0.54  --  0.50  CALCIUM 10.2  --  10.2  MG 1.9 1.9 1.7  PHOS 2.4* 3.5 3.1     IMAGING Dg Chest Port 1 View  Result Date: 01/16/2017 CLINICAL DATA:  Tracheostomy placement. Its patient has sustained a CVA. EXAM: PORTABLE CHEST 1 VIEW COMPARISON:  Portable chest x-ray of earlier today FINDINGS: The endotracheal tube has been removed and a tracheostomy appliance placed today. The tip of the tube lies approximately 12 mm below the inferior margin of the clavicular heads. The lungs are reasonably well inflated. There is no infiltrate or atelectasis. There is no pleural effusion. The heart and pulmonary vascularity are normal. IMPRESSION: No complication following tracheostomy tube placement. Electronically Signed   By: David  Martinique M.D.   On: 01/16/2017 10:55   Dg Chest Port 1 View  Result Date: 01/16/2017 CLINICAL DATA:  Intubation. EXAM: PORTABLE CHEST 1 VIEW COMPARISON:  01/14/2017 . FINDINGS: Endotracheal tube and  NG tube in stable position. Heart size stable. Normal pulmonary vascularity. No focal infiltrate. No pleural effusion or pneumothorax. IMPRESSION: 1. Lines and tubes in stable position. 2.  No acute cardiopulmonary disease P Electronically Signed   By: Marcello Moores  Register   On: 01/16/2017 07:24     PHYSICAL EXAM General - cachectic, intubated not on sedation. No changes in exam today, stable.  Ophthalmologic - Fundi not visualized due to left gaze.  Cardiovascular - Regular rate and rhythm.  Neuro - intubated not on sedation, does not open eye to sternal rub or pain, left gaze deviation does not cross midline, does not blink to threat, pupil 2.0 mm bilaterally, sluggish to light. Corneal reflexes preserved bilaterallyt. and gag cough positive. Facial symmetric.gimaces to pain.  . Mild withdrawal in all 4 extremities to pain. DTR diminished, toes equivocal. Sensation, coordination and gait not tested.   ASSESSMENT/PLAN April Franklin is a 76 y.o. female with history of colon cancer, metastatic pancreatobiliary adenocarcinoma, HTN, HLD and DM presenting with progressive altered mental state, HA and double vision. She did not receive IV t-PA due to delay in arrival.   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 Infarct embolic likely secondary to hypercoagulable state  from metastatic colon cancer.  Resultant unresponsive, comatose state  CT head subacute infarct R parietal lobe, no frank hematoma   MRI  extensive multi vascular supra and infratentorial infarcts. Right petechial hemorrhage.  MRA  basilar artery and R PCA occlusion, I suspect this is chronic changes due to lack of brainstem and PCA ischemia.   Cerebral angiogram TICI 3 revascularization of occluded basilar artery  2D Echo  EF 60-65%   Carotid Doppler unremarkable   LE  venous doppler  - right popliteal vein DVT  EEG diffuse slowing with superimposed left centrotemporal slowing   LDL 110  HgbA1c - 7.2  Lovenox 40 mg sq daily  for VTE prophylaxis Diet NPO time specified  No antithrombotic prior to admission, now on aspirin 325 mg daily.   Neuro prognosis poor in lady with metastatic cancer. Family meeting tomorrow. Dr. [name] discussed with different daughter present today.  Therapy recommendations:  pending  Disposition:  Pending  Acute respiratory failure secondary to stroke  Intubated for airway protection   Unable to extubated d/t comatose state and brainstem stroke  For trach placement today  Weaning continues  CCM on board  Right LE DVT - likely due to hypercoagulable state secondary to metastatic colon cancer  Not candidate for anticoagulation due to large infarct with petechial hemorrhage  Consider anticoagulation once PEG and trach placed  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 s/p liver bx 12/30/16  Likely the source of hypercoagulable state  Dysphagia  Secondary to stroke  Unable to recover swallow at this time  For PEG placement today  Hypertension  SBP goal < 180  offf Cleviprex, at goal  Hyperlipidemia  Home meds:  No statin  LDL 110, goal < 70  No statin due to elevated LFTs and liver metastasis  Diabetes  HgbA1c - 7.2  goal < 7.0  On lantus  SSI  Other Stroke Risk Factors  Advanced age  Other Active Problems  Encephalopathy d/t stroke  Elevated troponins - trending down, likely demand ischemia  Arthritis  Anemia - hemoglobin stable 7.7  Hypomagnesemia, resolved  hypernatremia  Hospital day # 8  Plan  patient seems to be tolerating the tube well so far. Hopefully will try to wean off ventilator after  tracheostomy over the next few days and transfer  to skilled nursing facility early next week. If unable to wean off ventilatory support may need transfer to a ventilator nursing home which may end up being out of state. Family has been clearly notified about this and they agree with the plan Discussed with daughter at the bedside and answered questions. This patient is critically ill and at significant risk of neurological worsening, death and care requires constant monitoring of vital signs, hemodynamics,respiratory and cardiac monitoring, extensive review of multiple databases, frequent neurological assessment, discussion with family, other specialists and medical decision making of high complexity.I have made any additions or clarifications directly to the above note.This critical care time does not reflect procedure time, or teaching time or supervisory time of PA/NP/Med Resident etc but could involve care discussion time.  I spent 30 minutes of neurocritical care time  in the care of  this patient.   [place] for Pager information 01/17/2017 10:16 AM    To contact Stroke Continuity provider, please refer to [email or web address] After hours, contact  General Neurology

## 2017-01-17 NOTE — Assessment & Plan Note (Signed)
Per neuro 

## 2017-01-17 NOTE — Op Note (Signed)
[facility] Patient Name: April Franklin Procedure Date : 01/16/2017 MRN: [id number] Attending MD: Georganna Skeans , MD Date of Birth: 16-Jul-1941 CSN: [id number] Age: 76 Admit Type: Inpatient Procedure:                Upper GI endoscopy Indications:              Place PEG due to neurological disorder causing                            impaired swallowing, Place PEG because patient is                            unable to eat due to stroke (CVA) Providers:                Georganna Skeans, MD, William Dalton, Technician Referring MD:              Medicines:                Midazolam 2 mg IV, Fentanyl 100 micrograms IV Complications:            No immediate complications. Estimated Blood Loss:      Procedure:                Pre-Anesthesia Assessment:                           - Prior to the procedure, a History and Physical                            was performed, and patient medications and                            allergies were reviewed. The patient is unable to                            give consent secondary to the patient's altered                            mental status. The risks and benefits of the                            procedure and the sedation options and risks were                            discussed with the patient's daughter. All                            questions were answered and informed consent was                            obtained. Patient identification and proposed                            procedure were verified by the physician, the nurse  and the technician in the procedure room. Mental                            Status Examination: obtunded. Airway Examination:                            tracheostomy via ventilator. Respiratory                            Examination: clear to auscultation. CV Examination:                            regular rate and rhythm. ASA Grade Assessment: III   - A patient with severe systemic disease. After                            reviewing the risks and benefits, the patient was                            deemed in satisfactory condition to undergo the                            procedure. The anesthesia plan was to use moderate                            sedation / analgesia (conscious sedation).                            Immediately prior to administration of medications,                            the patient was re-assessed for adequacy to receive                            sedatives. The heart rate, respiratory rate, oxygen                            saturations, blood pressure, adequacy of pulmonary                            ventilation, and response to care were monitored                            throughout the procedure. The physical status of                            the patient was re-assessed after the procedure.                           After obtaining informed consent, the endoscope was                            passed under direct vision. Throughout the  procedure, the patient's blood pressure, pulse, and                            oxygen saturations were monitored continuously. The                            Endoscope was introduced through the mouth, and                            advanced to the duodenal bulb. The upper GI                            endoscopy was accomplished without difficulty. The                            patient tolerated the procedure well. Scope In: Scope Out: Findings:      No gross lesions were noted [Site].      No gross lesions were noted in the pylorus. Placement of an externally       removable PEG with no T-fasteners was successfully completed. The       external bumper was at the 3.5 cm marking on the tube. Impression:               - No gross lesions in esophagus.                           - No gross lesions in the stomach.                           - An  externally removable PEG placement was                            successfully completed.                           - No specimens collected. Recommendation:           - Please follow the post-PEG recommendations                            including: may use PEG today for meds and water. Procedure Code(s):        --- Professional ---                           [phone], Esophagogastroduodenoscopy, flexible,                            transoral; with directed placement of percutaneous                            gastrostomy tube Diagnosis Code(s):        --- Professional ---                           [id number], Other symptoms and signs involving the  nervous system                           I69.398, Other sequelae of cerebral infarction CPT copyright 2016 [employer]. All rights reserved. The codes documented in this report are preliminary and upon coder review may  be revised to meet current compliance requirements. Georganna Skeans, MD 01/17/2017 3:02:43 PM This report has been signed electronically. Number of Addenda: 0

## 2017-01-17 NOTE — Progress Notes (Addendum)
PULMONARY / CRITICAL CARE MEDICINE   Name: April Franklin MRN: [id number] DOB: 16-Jan-1941    ADMISSION DATE:  01/09/2017 CONSULTATION DATE:  01/09/2017  REFERRING MD:  Dr. [name]   CHIEF COMPLAINT:  Basilar Artery Occlusion   brief  76 y.o. female with known history of diabetes mellitus, colon cancer postresection, hypertension, and hyperlipidemia. Status post liver biopsy of hepatic masses with MRCP on 12/30/16.   On 2/15 was found to have a subacute infarct. MRI demonstrated extensive areas of infarct emerging from the basilar artery and right posterior communicating artery with occlusion. Patient underwent revascularization in interventional radiology on 2/16 and return to the ICU intubated.   SIGNIFICANT EVENTS: 02/05 - MRCP with hepatic mass biopsies 02/15 - Admit 02/16 - basilar artery revascularization in interventional radiology ETT 2/16 >> 2/21: No change in neuro status. Remains off sedation. Weaning on vent 5/5  2/22 - for palliative trach today as destination to go home with  Home hospice. sHe is DNR if arrests     SUBJECTIVE/OVERNIGHT/INTERVAL HX 2/23- reaims comatose. Doing PSV on vent. Per RN family now saying they want home health and not home hospice  VITAL SIGNS: BP 140/67   Pulse 84   Temp (!) 100.4 F (38 C) (Axillary) Comment: ice packs applied  Resp (!) 26   Ht 5\' 2"  (1.575 m)   Wt 67 kg (147 lb 11.3 oz)   SpO2 100%   BMI 27.02 kg/m   HEMODYNAMICS:    VENTILATOR SETTINGS: Vent Mode: PRVC FiO2 (%):  [30 %-40 %] 30 % Set Rate:  [18 bmp] 18 bmp Vt Set:  [400 mL] 400 mL PEEP:  [5 cmH20] 5 cmH20 Plateau Pressure:  [11 cmH20-17 cmH20] 17 cmH20  INTAKE / OUTPUT: I/O last 3 completed shifts: In: 1903.7 [I.V.:500.4; Other:100; NG/GT:1303.3] Out: 850 [Urine:750; Drains:100]  PHYSICAL EXAMINATION:    General Appearance:    Looks criticall ill OBESE - no  Head:    Normocephalic, without obvious abnormality, atraumatic  Eyes:    PERRL - yes,  conjunctiva/corneas - clear      Ears:    Normal external ear canals, both ears  Nose:   No bleeding  Throat:  TRACH - yes and clean ,   Neck:   Supple,  No enlargement/tenderness/nodules     Lungs:     Clear to auscultation bilaterally, Ventilator   Synchrony - doing PSV  Chest wall:    No deformity  Heart:    S1 and S2 normal, no murmur, CVP - na.  Pressors - no  Abdomen:     Soft, no masses, no organomegaly  Genitalia:    Not done  Rectal:   not done  Extremities:   Extremities- no edema     Skin:   Intact in exposed areas .      Neurologic:   Sedation - none -> RASS - -4 equivalent .        LABS: PULMONARY No results for input(s): PHART, PCO2ART, PO2ART, HCO3, TCO2, O2SAT in the last 168 hours.  Invalid input(s): PCO2, PO2  CBC  Recent Labs Lab 01/15/17 0837 01/16/17 0334 01/17/17 0413  HGB 7.9* 7.7* 7.9*  HCT 24.9* 23.9* 25.0*  WBC 5.5 5.7 6.7  PLT 133* 155 180    COAGULATION  Recent Labs Lab 01/15/17 1525  INR 1.18    CARDIAC    Recent Labs Lab 01/10/17 1951  TROPONINI 0.92*   No results for input(s): PROBNP in the last 168 hours.  CHEMISTRY  Recent Labs Lab 01/13/17 0534 01/13/17 1711 01/14/17 0306 01/15/17 0837 01/16/17 0334 01/16/17 1412 01/17/17 0413  NA 147*  --  150* 149* 144  --  142  K 4.0  --  4.4 4.0 3.5  --  3.3*  CL 111  --  115* 110 107  --  107  CO2 26  --  29 30 29   --  29  GLUCOSE 192*  --  272* 267* 216*  --  248*  BUN 14  --  18 16 14   --  16  CREATININE 0.58  --  0.61 0.52 0.54  --  0.50  CALCIUM 10.5*  --  10.2 10.3 10.2  --  10.2  MG 1.8 1.7  --  1.7 1.9 1.9 1.7  PHOS 2.5 1.9*  --  2.0* 2.4* 3.5 3.1   Estimated Creatinine Clearance: 55.4 mL/min (by C-G formula based on SCr of 0.5 mg/dL).   LIVER  Recent Labs Lab 01/12/17 0244 01/15/17 1525  ALBUMIN 2.9*  --   INR  --  1.18     INFECTIOUS No results for input(s): LATICACIDVEN, PROCALCITON in the last 168 hours.   ENDOCRINE CBG (last 3)    Recent Labs  01/17/17 0309 01/17/17 0803 01/17/17 1153  GLUCAP 229* 246* 255*         IMAGING x48h  - image(s) personally visualized  -   highlighted in bold Dg Chest Port 1 View  Result Date: 01/16/2017 CLINICAL DATA:  Tracheostomy placement. Its patient has sustained a CVA. EXAM: PORTABLE CHEST 1 VIEW COMPARISON:  Portable chest x-ray of earlier today FINDINGS: The endotracheal tube has been removed and a tracheostomy appliance placed today. The tip of the tube lies approximately 12 mm below the inferior margin of the clavicular heads. The lungs are reasonably well inflated. There is no infiltrate or atelectasis. There is no pleural effusion. The heart and pulmonary vascularity are normal. IMPRESSION: No complication following tracheostomy tube placement. Electronically Signed   By: David  Martinique M.D.   On: 01/16/2017 10:55   Dg Chest Port 1 View  Result Date: 01/16/2017 CLINICAL DATA:  Intubation. EXAM: PORTABLE CHEST 1 VIEW COMPARISON:  01/14/2017 . FINDINGS: Endotracheal tube and NG tube in stable position. Heart size stable. Normal pulmonary vascularity. No focal infiltrate. No pleural effusion or pneumothorax. IMPRESSION: 1. Lines and tubes in stable position. 2.  No acute cardiopulmonary disease P Electronically Signed   By: Marcello Moores  Register   On: 01/16/2017 07:24       STUDIES:  MRI/MRA HEAD 2/15: MRI HEAD: Acute extensive multi vascular territory supra and infratentorial infarcts. RIGHT petechial hemorrhage without hemorrhagic conversion. 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 Reconstitution of basilar tip and LEFT posterior cerebral artery via LEFT posterior communicating artery. CT HEAD W/O 2/16:  Evolving multifocal infarcts with worsening RIGHT cerebellar cytotoxic edema partially effacing the fourth ventricle. No obstructive hydrocephalus. Contrast staining and/or petechial hemorrhage RIGHT parieto-occipital lobe and RIGHT  cerebellar infarcts without frank hemorrhagic conversion. BILATERAL LE VENOUS DUPLEX 2/16: Acute DVT involving the peroneal veins of the right lower extremity. No evidence of superficial vein thrombosis of the right lower extremity. No evidence of deep or superficial vein thrombosis of the left lower extremity. PORT CXR 2/18:  Personally reviewed by me. No focal opacity or mass appreciated. No pleural effusion. Elevation of left hemidiaphragm. Endotracheal tube in good position. Enteric feeding tube coursing below diaphragm.  CULTURES: Urine Culture 2/10:  Multiple species  present MRSA PCR 2/16:  Negative   ANTIBIOTICS: none  Patient Active Problem List   Diagnosis Date Noted  . Cerebrovascular accident (CVA) due to thrombosis of precerebral artery ([place])   . Respiratory failure ([place])   . Cerebral embolism with cerebral infarction 01/10/2017  . Acute encephalopathy 01/09/2017  . Elevated troponin 01/09/2017  . Type 2 diabetes mellitus with hyperglycemia, with long-term current use of insulin ([place]) 01/09/2017  . Hypercalcemia 01/09/2017  . Nonintractable headache   . Hypertension 02/21/2012  . Diabetes mellitus 02/21/2012  . Colon cancer ([place]) 02/21/2012     DISCUSSION: 76 y.o.  female with acute respiratory failure unable to protect her airway post basilar artery occlusion and revascularization on 2/16. Patient also with hepatic metastases consistent with adenocarcinoma on biopsy from 2/5. Patient's mental status and neurologic recovery seems stagnant and essentially nonexistent. Family understands this, but are praying for a miracle     ASSESSMENT / PLAN:  Acute encephalopathy with coma Coma continue sand is due to stroke  Plan Per neuro  Acute on chronic respiratory failure ([place]) s/p Feinstein Tracheostomy 01/16/17 Doing PSV 01/17/2017 Ok to move to  SDU given no sedation needs  Cerebral embolism with cerebral infarction Per neuro  Metastatic adenocarcinoma to liver  [facility]) Poor prognosis DNR but s/p trach and peg and want to take patient home  Anemia of critical lillness  Recent Labs Lab 01/15/17 0837 01/16/17 0334 01/17/17 0413  HGB 7.9* 7.7* 7.9*  HCT 24.9* 23.9* 25.0*  WBC 5.5 5.7 6.7  PLT 133* 155 180   - PRBC for hgb </= 6.9gm%    - exceptions are   -  if ACS susepcted/confirmed then transfuse for hgb </= 8.0gm%,  or    -  active bleeding with hemodynamic instability, then transfuse regardless of hemoglobin value   At at all times try to transfuse 1 unit prbc as possible with exception of active hemorrhage    Hypomagnesemia Replete 2gm  At risk for hyperglycemia ssi      FAMILY  - Updates: APP d/w husband this AM 2/21, he is plan is to trach/peg with the goal of being able to go home with hospice/home health and have as much time as he can. Son updated 01/17/2017  - Inter-disciplinary family meet or Palliative Care meeting due by: Ongoing     DISPO  [place] for sdu for pccm standpointD/w son and Dr [name] - ok for sdu transfer.    Dr. Brand Males, M.D., [facility] Pulmonary and Critical Care Medicine Staff Physician [place] [facility] Pager: [phone], If no answer or between  15:00h - 7:00h: call [phone]  01/17/2017 12:08 PM

## 2017-01-17 NOTE — Assessment & Plan Note (Signed)
Coma continue sand is due to stroke  Plan Per neuro

## 2017-01-17 NOTE — Assessment & Plan Note (Addendum)
Doing PSV 01/17/2017 Ok to move to  SDU given no sedation needs

## 2017-01-18 DIAGNOSIS — Z931 Gastrostomy status: Secondary | ICD-10-CM

## 2017-01-18 DIAGNOSIS — D649 Anemia, unspecified: Secondary | ICD-10-CM

## 2017-01-18 DIAGNOSIS — Z430 Encounter for attention to tracheostomy: Secondary | ICD-10-CM

## 2017-01-18 DIAGNOSIS — Z9911 Dependence on respirator [ventilator] status: Secondary | ICD-10-CM

## 2017-01-18 DIAGNOSIS — J9621 Acute and chronic respiratory failure with hypoxia: Secondary | ICD-10-CM

## 2017-01-18 LAB — GLUCOSE, CAPILLARY
GLUCOSE-CAPILLARY: 156 mg/dL — AB (ref 65–99)
GLUCOSE-CAPILLARY: 221 mg/dL — AB (ref 65–99)
GLUCOSE-CAPILLARY: 197 mg/dL — AB (ref 65–99)
Glucose-Capillary: 265 mg/dL — ABNORMAL HIGH (ref 65–99)
Glucose-Capillary: 218 mg/dL — ABNORMAL HIGH (ref 65–99)
Glucose-Capillary: 195 mg/dL — ABNORMAL HIGH (ref 65–99)

## 2017-01-18 LAB — CBC
HEMATOCRIT: 25.1 % — AB (ref 36.0–46.0)
HEMOGLOBIN: 7.9 g/dL — AB (ref 12.0–15.0)
MCH: 27.7 pg (ref 26.0–34.0)
MCHC: 31.5 g/dL (ref 30.0–36.0)
MCV: 88.1 fL (ref 78.0–100.0)
Platelets: 191 10*3/uL (ref 150–400)
RBC: 2.85 MIL/uL — ABNORMAL LOW (ref 3.87–5.11)
RDW: 13.9 % (ref 11.5–15.5)
WBC: 8.2 10*3/uL (ref 4.0–10.5)

## 2017-01-18 LAB — BASIC METABOLIC PANEL
ANION GAP: 9 (ref 5–15)
BUN: 15 mg/dL (ref 6–20)
CALCIUM: 10.1 mg/dL (ref 8.9–10.3)
CO2: 30 mmol/L (ref 22–32)
Chloride: 105 mmol/L (ref 101–111)
Creatinine, Ser: 0.5 mg/dL (ref 0.44–1.00)
GFR calc Af Amer: >60 mL/min (ref >60)
GLUCOSE: 241 mg/dL — AB (ref 65–99)
POTASSIUM: 3.3 mmol/L — AB (ref 3.5–5.1)
Sodium: 144 mmol/L (ref 135–145)

## 2017-01-18 LAB — PHOSPHORUS: PHOSPHORUS: 1.7 mg/dL — AB (ref 2.5–4.6)

## 2017-01-18 LAB — MAGNESIUM: Magnesium: 1.8 mg/dL (ref 1.7–2.4)

## 2017-01-18 MED ORDER — HYDRALAZINE HCL 20 MG/ML IJ SOLN
5.0000 mg | Freq: Four times a day (QID) | INTRAMUSCULAR | Status: DC | PRN
  Administered 2017-01-24: 5 mg via INTRAVENOUS
  Filled 2017-01-18: qty 1

## 2017-01-18 MED ORDER — POTASSIUM CHLORIDE 20 MEQ/15ML (10%) PO SOLN
40.0000 meq | ORAL | Status: AC
  Administered 2017-01-18 (×2): 40 meq via ORAL
  Filled 2017-01-18 (×2): qty 30

## 2017-01-18 MED ORDER — INSULIN GLARGINE 100 UNIT/ML ~~LOC~~ SOLN
15.0000 [IU] | Freq: Two times a day (BID) | SUBCUTANEOUS | Status: DC
  Administered 2017-01-18 – 2017-01-19 (×4): 15 [IU] via SUBCUTANEOUS
  Filled 2017-01-18 (×5): qty 0.15

## 2017-01-18 NOTE — Progress Notes (Signed)
STROKE TEAM PROGRESS NOTE   SUBJECTIVE (INTERVAL HISTORY) Patient daughter at bedside. On trach and PEG, no acute distress. Not open eyes on command, not following commands, not moving extremities on pain but grimace with left facial droop.     OBJECTIVE Temp:  [99 F (37.2 C)-100.4 F (38 C)] 99.4 F (37.4 C) (02/24 0441) Pulse Rate:  [81-100] 97 (02/24 0441) Cardiac Rhythm: Normal sinus rhythm (02/23 2210) Resp:  [18-39] 24 (02/24 0441) BP: (129-147)/(60-71) 142/67 (02/24 0441) SpO2:  [97 %-100 %] 97 % (02/24 0441) FiO2 (%):  [28 %-30 %] 28 % (02/24 0441) Weight:  [67.3 kg (148 lb 5.9 oz)] 67.3 kg (148 lb 5.9 oz) (02/24 0441)  CBC:   Recent Labs Lab 01/17/17 0413 01/18/17 0329  WBC 6.7 8.2  HGB 7.9* 7.9*  HCT 25.0* 25.1*  MCV 88.7 88.1  PLT 180 [id number]    Basic Metabolic Panel:   Recent Labs Lab 01/17/17 0413 01/17/17 1641 01/18/17 0329  NA 142  --  144  K 3.3*  --  3.3*  CL 107  --  105  CO2 29  --  30  GLUCOSE 248*  --  241*  BUN 16  --  15  CREATININE 0.50  --  0.50  CALCIUM 10.2  --  10.1  MG 1.7 2.5* 1.8  PHOS 3.1 2.7 1.7*     IMAGING I have personally reviewed the radiological images below and agree with the radiology interpretations.  Ct Head Wo Contrast 01/09/2017 1. Subacute infarct of the posterosuperior right parietal lobe with expected evolution since the CT of 01/04/2017. No hemorrhage or mass effect. MRI could be obtained for further characterization. 2. Chronic microvascular ischemia.   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 Reconstitution of basilar tip and LEFT posterior cerebral artery via LEFT posterior communicating artery.   Mr Brain Wo Contrast 01/10/2017 Acute extensive multi vascular territory supra and infratentorial infarcts. RIGHT petechial hemorrhage without hemorrhagic conversion.   Cerebral angiogram 01/10/2017 S/P bilateral veretbral artery angiograms  followed by endovascular complete revascularization of occluded basilar artery X 1pass with trevoprovue 28mm x30 mm retrieval device ,and x 2 passes with solitaire FR 23mm x 40 mm devise achieving a TICI 3 reperfusion. Also used 10.4 mg of IA superselective Integrelin  Ct Head Wo Contrast 01/10/2017 Evolving multifocal infarcts with worsening RIGHT cerebellar cytotoxic edema partially effacing the fourth ventricle. No obstructive hydrocephalus. Contrast staining and/or petechial hemorrhage RIGHT parieto-occipital lobe and RIGHT cerebellar infarcts without frank hemorrhagic conversion.   EEG -  This sedatedEEG is abnormal due to the presence of: 1. Moderatediffuse slowing of thebackground 2. Additional focal slowing over the left centrotemporal region Clinical Correlation of the above findings indicates diffuse cerebral dysfunction that is non-specific in etiology and can be seen with hypoxic/ischemic injury, toxic/metabolic encephalopathies, or medication effect from Propofol. Additional focal slowing over the left centrotemporal region indicates focal cerebral dysfunction in this region suggestive of underlying structural or physiologic abnormality. There were no electrographic seizures in this study.The absence of epileptiform discharges does not rule out a clinical diagnosis of epilepsy. Clinical correlation is advised.  LE venous Doppler There is evidence of acute deep vein thrombosis involving the peroneal veins of the right lower extremity.  There is no evidence of superficial vein thrombosis involving the right lower extremity. There is no evidence of deep or superficial vein thrombosis involving the left lower extremity. There is no evidence of a Baker's cyst bilaterally.  Carotid Doppler Findings are consistent with a 1-39 percent stenosis involving the right internal carotid artery and the left internal carotid artery. The vertebral arteries demonstrate antegrade flow.    TEE Left ventricle: The cavity size was normal. Wall thickness was increased in a pattern of mild LVH. Systolic function was normal. The estimated ejection fraction was in the range of 60% to 65%. Wall motion was normal; there were no regional wall motion abnormalities. Features are consistent with a pseudonormal left ventricular filling pattern, with concomitant abnormal relaxation and increased filling pressure (grade 2 diastolic dysfunction). - Aortic valve: There was mild regurgitation. Valve area (VTI): 1.76 cm^2. Valve area (Vmax): 2.02 cm^2. Valve area (Vmean): 1.95 cm^2. - Mitral valve: Valve area by pressure half-time: 2.47 cm^2.   PHYSICAL EXAM  General - cachectic, on trach collar. Not following commands or open eyes  Ophthalmologic - Fundi not visualized.  Cardiovascular - Regular rate and rhythm.  Neuro - on trach collar, does not open eye to sternal rub or pain, left gaze deviation preference but able to move to cross midline spontaneously, doll's eye present but not consistently tracking objects. Did not blink to threat, pupil 2.5 mm bilaterally, reactive to light. Corneal reflexes preserved bilaterallyt and gag cough positive. Left facial droop when gimaces to pain.  Frown bilaterally. No significant movement in all 4 extremities to pain. DTR diminished, no babinski. Sensation, coordination and gait not tested.   ASSESSMENT/PLAN Ms. CHRISTINE PANZARELLA is a 76 y.o. female with history of colon cancer, metastatic pancreatobiliary adenocarcinoma, HTN, HLD and DM presenting with progressive altered mental state, HA and double vision. She did not receive IV t-PA due to delay in arrival.   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 Infarct embolic likely secondary to hypercoagulable state from metastatic colon  cancer.  Resultant unresponsive, comatose state  CT head subacute infarct R parietal lobe, no frank hematoma   MRI  extensive multi vascular supra and infratentorial infarcts. Right petechial hemorrhage.  MRA  basilar artery and R PCA occlusion, I suspect this is chronic changes due to lack of brainstem and PCA ischemia.   Cerebral angiogram TICI 3 revascularization of occluded basilar artery  2D Echo  EF 60-65%   Carotid Doppler unremarkable   LE venous doppler  - right popliteal vein DVT  EEG diffuse slowing with superimposed left centrotemporal slowing   LDL 110  HgbA1c - 7.2  Lovenox 40 mg sq daily  for VTE prophylaxis Diet NPO time specified  No antithrombotic prior to admission, now on aspirin 325 mg daily. Will consider to start lovenox therapeutic dose tomorrow due to procedure yesterday and continued anemia.   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 s/p liver bx 12/30/16  Likely the source of hypercoagulable state  Dysphagia  Secondary to stroke  On tube  feeding now 40cc/h with free water 120cc/Q3h  PEG placement 01/17/17  Hypertension  SBP goal normotensive  stable  offf Cleviprex  Hyperlipidemia  Home meds:  No statin  LDL 110, goal < 70  No statin due to elevated LFTs and liver  metastasis  Diabetes  HgbA1c - 7.2  goal < 7.0  On lantus, increase to 15U bid  SSI  CBG still not in good control  Other Stroke Risk Factors  Advanced age  Other Active Problems  Elevated troponins - trending down, likely demand ischemia  Anemia - hemoglobin stable 7.7->7.9  Hypokalemia - supplement  Hospital day # 9  Rosalin Hawking, MD PhD Stroke Neurology 01/18/2017 9:54 AM  To contact Stroke Continuity provider, please refer to [email or web address] After hours, contact General Neurology

## 2017-01-18 NOTE — Progress Notes (Signed)
Pt. tolerating 28%/6 lpm ATC well, family at bedside, Vent remains at bedside on STBY, RT to monitor.

## 2017-01-18 NOTE — Progress Notes (Signed)
PULMONARY / CRITICAL CARE MEDICINE   Name: April Franklin MRN: [id number] DOB: 09-16-1941    ADMISSION DATE:  01/09/2017 CONSULTATION DATE:  01/09/2017  REFERRING MD:  Dr. [name]   CHIEF COMPLAINT:  Basilar Artery Occlusion   brief  76 y.o. female with known history of diabetes mellitus, colon cancer postresection, hypertension, and hyperlipidemia. Status post liver biopsy of hepatic masses with MRCP on 12/30/16.   On 2/15 was found to have a subacute infarct. MRI demonstrated extensive areas of infarct emerging from the basilar artery and right posterior communicating artery with occlusion. Patient underwent revascularization in interventional radiology on 2/16 and return to the ICU intubated.   SIGNIFICANT EVENTS: 02/05 - MRCP with hepatic mass biopsies 02/15 - Admit 02/16 - basilar artery revascularization in interventional radiology ETT 2/16 >> 2/21: No change in neuro status. Remains off sedation. Weaning on vent 5/5  2/22 - for palliative trach today as destination to go home with  Home hospice. sHe is DNR if arrests -      SUBJECTIVE/OVERNIGHT/INTERVAL HX  daughter says she smiled at them - she did not respond at all to me  VITAL SIGNS: BP 135/63   Pulse 96   Temp 99.2 F (37.3 C) (Axillary)   Resp (!) 34   Ht 5\' 2"  (1.575 m)   Wt 148 lb 5.9 oz (67.3 kg)   SpO2 97%   BMI 27.14 kg/m   HEMODYNAMICS:    VENTILATOR SETTINGS: Vent Mode: CPAP;PSV FiO2 (%):  [28 %-30 %] 28 % PEEP:  [5 cmH20] 5 cmH20 Pressure Support:  [5 cmH20] 5 cmH20  INTAKE / OUTPUT: I/O last 3 completed shifts: In: 2100 [I.V.:240; Other:130; NG/GT:1680; IV Piggyback:50] Out: 775 [Urine:775]  PHYSICAL EXAMINATION:  Elderl  No jvd Oropharynx clear Neck supple Lungs with a few scattered exp > insp rhonchi bilaterally RRR no s3 or or sign murmur Abd soft with nl excursion >> Extr wam with no edema or clubbing noted Neuro  No response to verbal     LABS: PULMONARY No results for  input(s): PHART, PCO2ART, PO2ART, HCO3, TCO2, O2SAT in the last 168 hours.  Invalid input(s): PCO2, PO2  CBC  Recent Labs Lab 01/16/17 0334 01/17/17 0413 01/18/17 0329  HGB 7.7* 7.9* 7.9*  HCT 23.9* 25.0* 25.1*  WBC 5.7 6.7 8.2  PLT 155 180 191    COAGULATION  Recent Labs Lab 01/15/17 1525  INR 1.18    CARDIAC   No results for input(s): TROPONINI in the last 168 hours. No results for input(s): PROBNP in the last 168 hours.   CHEMISTRY  Recent Labs Lab 01/14/17 0306 01/15/17 0837 01/16/17 0334 01/16/17 1412 01/17/17 0413 01/17/17 1641 01/18/17 0329  NA 150* 149* 144  --  142  --  144  K 4.4 4.0 3.5  --  3.3*  --  3.3*  CL 115* 110 107  --  107  --  105  CO2 29 30 29   --  29  --  30  GLUCOSE 272* 267* 216*  --  248*  --  241*  BUN 18 16 14   --  16  --  15  CREATININE 0.61 0.52 0.54  --  0.50  --  0.50  CALCIUM 10.2 10.3 10.2  --  10.2  --  10.1  MG  --  1.7 1.9 1.9 1.7 2.5* 1.8  PHOS  --  2.0* 2.4* 3.5 3.1 2.7 1.7*   Estimated Creatinine Clearance: 55.5 mL/min (by C-G formula based on  SCr of 0.5 mg/dL).   LIVER  Recent Labs Lab 01/12/17 0244 01/15/17 1525  ALBUMIN 2.9*  --   INR  --  1.18     INFECTIOUS No results for input(s): LATICACIDVEN, PROCALCITON in the last 168 hours.   ENDOCRINE CBG (last 3)   Recent Labs  01/18/17 0109 01/18/17 0441 01/18/17 0815  GLUCAP 265* 218* 156*         IMAGING x48h  - image(s) personally visualized  -   highlighted in bold No results found.     STUDIES:  MRI/MRA HEAD 2/15: MRI HEAD: Acute extensive multi vascular territory supra and infratentorial infarcts. RIGHT petechial hemorrhage without hemorrhagic conversion. 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 Reconstitution of basilar tip and LEFT posterior cerebral artery via LEFT posterior communicating artery. CT HEAD W/O 2/16:  Evolving multifocal infarcts with worsening RIGHT cerebellar cytotoxic  edema partially effacing the fourth ventricle. No obstructive hydrocephalus. Contrast staining and/or petechial hemorrhage RIGHT parieto-occipital lobe and RIGHT cerebellar infarcts without frank hemorrhagic conversion. BILATERAL LE VENOUS DUPLEX 2/16: Acute DVT involving the peroneal veins of the right lower extremity. No evidence of superficial vein thrombosis of the right lower extremity. No evidence of deep or superficial vein thrombosis of the left lower extremity. PORT CXR 2/18:  Personally reviewed by me. No focal opacity or mass appreciated. No pleural effusion. Elevation of left hemidiaphragm. Endotracheal tube in good position. Enteric feeding tube coursing below diaphragm.  CULTURES: Urine Culture 2/10:  Multiple species present MRSA PCR 2/16:  Negative   ANTIBIOTICS: none  Patient Active Problem List   Diagnosis Date Noted  . Anemia of critical lillness 01/17/2017  . Hypomagnesemia 01/17/2017  . Cerebrovascular accident (CVA) due to thrombosis of precerebral artery ([place])   . Acute on chronic respiratory failure ([place]) s/p Feinstein Tracheostomy 01/16/17   . Cerebral embolism with cerebral infarction 01/10/2017  . Acute encephalopathy with coma 01/09/2017  . Elevated troponin 01/09/2017  . Type 2 diabetes mellitus with hyperglycemia, with long-term current use of insulin ([place]) 01/09/2017  . Hypercalcemia 01/09/2017  . Nonintractable headache   . Metastatic adenocarcinoma to liver ([place]) 12/30/2016  . Hypertension 02/21/2012  . At risk for hyperglycemia 02/21/2012  . Colon cancer ([place]) 02/21/2012     DISCUSSION: 76 y.o.  female with acute respiratory failure unable to protect her airway post basilar artery occlusion and revascularization on 2/16. Patient also with hepatic metastases consistent with adenocarcinoma on biopsy from 2/5. Patient's mental status and neurologic recovery seems stagnant       ASSESSMENT / PLAN:  Acute encephalopathy with coma Per neuro       Acute on chronic respiratory failure ([name]) s/p Feinstein Tracheostomy 01/16/17 Doing [place] since  2/23 as tol  Cerebral embolism with cerebral infarction Per neuro  Metastatic adenocarcinoma to liver [facility]) Poor prognosis DNR but s/p trach and peg and want to take patient home  Anemia of critical lillness  Recent Labs Lab 01/15/17 0837 01/16/17 0334 01/17/17 0413  HGB 7.9* 7.7* 7.9*  HCT 24.9* 23.9* 25.0*  WBC 5.5 5.7 6.7  PLT 133* 155 180   - PRBC for hgb </= 6.9gm%    - exceptions are   -  if ACS susepcted/confirmed then transfuse for hgb </= 8.0gm%,  or    -  active bleeding with hemodynamic instability, then transfuse regardless of hemoglobin value   At at all times try to transfuse 1 unit prbc as possible with exception of active hemorrhage  Hypomagnesemia Resolved  At risk for hyperglycemia ssi     Prognosis for functional recovery nil/ best we can do is get her comfortably home to hospice   Christinia Gully, MD Pulmonary and [place] [phone] After 5:30 PM or weekends, use [facility] [phone]

## 2017-01-19 LAB — GLUCOSE, CAPILLARY
GLUCOSE-CAPILLARY: 195 mg/dL — AB (ref 65–99)
GLUCOSE-CAPILLARY: 208 mg/dL — AB (ref 65–99)
GLUCOSE-CAPILLARY: 211 mg/dL — AB (ref 65–99)
GLUCOSE-CAPILLARY: 214 mg/dL — AB (ref 65–99)
Glucose-Capillary: 229 mg/dL — ABNORMAL HIGH (ref 65–99)
Glucose-Capillary: 182 mg/dL — ABNORMAL HIGH (ref 65–99)
Glucose-Capillary: 236 mg/dL — ABNORMAL HIGH (ref 65–99)

## 2017-01-19 LAB — BASIC METABOLIC PANEL
Anion gap: 7 (ref 5–15)
BUN: 14 mg/dL (ref 6–20)
CHLORIDE: 106 mmol/L (ref 101–111)
CO2: 28 mmol/L (ref 22–32)
CREATININE: 0.56 mg/dL (ref 0.44–1.00)
Calcium: 9.9 mg/dL (ref 8.9–10.3)
GFR calc Af Amer: >60 mL/min (ref >60)
GFR calc non Af Amer: >60 mL/min (ref >60)
GLUCOSE: 253 mg/dL — AB (ref 65–99)
Potassium: 4.2 mmol/L (ref 3.5–5.1)
SODIUM: 141 mmol/L (ref 135–145)

## 2017-01-19 LAB — CBC
HEMATOCRIT: 24.6 % — AB (ref 36.0–46.0)
Hemoglobin: 8 g/dL — ABNORMAL LOW (ref 12.0–15.0)
MCH: 28.5 pg (ref 26.0–34.0)
MCHC: 32.5 g/dL (ref 30.0–36.0)
MCV: 87.5 fL (ref 78.0–100.0)
Platelets: 202 10*3/uL (ref 150–400)
RBC: 2.81 MIL/uL — ABNORMAL LOW (ref 3.87–5.11)
RDW: 14 % (ref 11.5–15.5)
WBC: 10.3 10*3/uL (ref 4.0–10.5)

## 2017-01-19 MED ORDER — ENOXAPARIN SODIUM 80 MG/0.8ML ~~LOC~~ SOLN
70.0000 mg | Freq: Two times a day (BID) | SUBCUTANEOUS | Status: DC
  Administered 2017-01-19 – 2017-01-28 (×18): 70 mg via SUBCUTANEOUS
  Filled 2017-01-19 (×17): qty 0.8

## 2017-01-19 MED ORDER — LOSARTAN POTASSIUM 50 MG PO TABS
50.0000 mg | ORAL_TABLET | Freq: Every day | ORAL | Status: DC
  Administered 2017-01-19 – 2017-01-20 (×2): 50 mg via ORAL
  Filled 2017-01-19 (×2): qty 1

## 2017-01-19 MED ORDER — LINAGLIPTIN 5 MG PO TABS
5.0000 mg | ORAL_TABLET | Freq: Every day | ORAL | Status: DC
  Administered 2017-01-19 – 2017-01-20 (×2): 5 mg via ORAL
  Filled 2017-01-19 (×2): qty 1

## 2017-01-19 MED ORDER — METFORMIN HCL 500 MG PO TABS
1000.0000 mg | ORAL_TABLET | Freq: Every day | ORAL | Status: DC
  Administered 2017-01-19 – 2017-01-21 (×3): 1000 mg via ORAL
  Filled 2017-01-19 (×3): qty 2

## 2017-01-19 MED ORDER — GLIPIZIDE 5 MG PO TABS
10.0000 mg | ORAL_TABLET | Freq: Every day | ORAL | Status: DC
  Administered 2017-01-19 – 2017-01-20 (×2): 10 mg via ORAL
  Filled 2017-01-19 (×2): qty 2

## 2017-01-19 MED ORDER — ORAL CARE MOUTH RINSE
15.0000 mL | Freq: Two times a day (BID) | OROMUCOSAL | Status: DC
  Administered 2017-01-20 – 2017-01-31 (×23): 15 mL via OROMUCOSAL

## 2017-01-19 MED ORDER — CHLORHEXIDINE GLUCONATE 0.12 % MT SOLN
15.0000 mL | Freq: Two times a day (BID) | OROMUCOSAL | Status: DC
  Administered 2017-01-20 – 2017-01-31 (×24): 15 mL via OROMUCOSAL
  Filled 2017-01-19 (×21): qty 15

## 2017-01-19 NOTE — Progress Notes (Signed)
STROKE TEAM PROGRESS NOTE   SUBJECTIVE (INTERVAL HISTORY) Patient daughter at bedside. Daughter stated that pt opened eyes twice this morning spontaneously, however, she did not open eyes for me during round even with pain stimulation. Hb stable. Will start lovenox therapeutic dose.      OBJECTIVE Temp:  [98.9 F (37.2 C)-101.3 F (38.5 C)] 98.9 F (37.2 C) (02/25 0401) Pulse Rate:  [87-109] 109 (02/25 0401) Cardiac Rhythm: Normal sinus rhythm (02/24 2000) Resp:  [25-42] 42 (02/25 0401) BP: (139-161)/(60-73) 161/69 (02/25 0401) SpO2:  [94 %-99 %] 96 % (02/25 0401) FiO2 (%):  [28 %] 28 % (02/25 0144) Weight:  [73.1 kg (161 lb 2.5 oz)] 73.1 kg (161 lb 2.5 oz) (02/25 0401)  CBC:   Recent Labs Lab 01/18/17 0329 01/19/17 0305  WBC 8.2 10.3  HGB 7.9* 8.0*  HCT 25.1* 24.6*  MCV 88.1 87.5  PLT 191 [id number]    Basic Metabolic Panel:   Recent Labs Lab 01/17/17 1641 01/18/17 0329 01/19/17 0305  NA  --  144 141  K  --  3.3* 4.2  CL  --  105 106  CO2  --  30 28  GLUCOSE  --  241* 253*  BUN  --  15 14  CREATININE  --  0.50 0.56  CALCIUM  --  10.1 9.9  MG 2.5* 1.8  --   PHOS 2.7 1.7*  --      IMAGING I have personally reviewed the radiological images below and agree with the radiology interpretations.  Ct Head Wo Contrast 01/09/2017 1. Subacute infarct of the posterosuperior right parietal lobe with expected evolution since the CT of 01/04/2017. No hemorrhage or mass effect. MRI could be obtained for further characterization. 2. Chronic microvascular ischemia.   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 Reconstitution of basilar tip and LEFT posterior cerebral artery via LEFT posterior communicating artery.   Mr Brain Wo Contrast 01/10/2017 Acute extensive multi vascular territory supra and infratentorial infarcts. RIGHT petechial hemorrhage without hemorrhagic conversion.   Cerebral  angiogram 01/10/2017 S/P bilateral veretbral artery angiograms followed by endovascular complete revascularization of occluded basilar artery X 1pass with trevoprovue 64mm x30 mm retrieval device ,and x 2 passes with solitaire FR 20mm x 40 mm devise achieving a TICI 3 reperfusion. Also used 10.4 mg of IA superselective Integrelin  Ct Head Wo Contrast 01/10/2017 Evolving multifocal infarcts with worsening RIGHT cerebellar cytotoxic edema partially effacing the fourth ventricle. No obstructive hydrocephalus. Contrast staining and/or petechial hemorrhage RIGHT parieto-occipital lobe and RIGHT cerebellar infarcts without frank hemorrhagic conversion.   EEG -  This sedatedEEG is abnormal due to the presence of: 1. Moderatediffuse slowing of thebackground 2. Additional focal slowing over the left centrotemporal region Clinical Correlation of the above findings indicates diffuse cerebral dysfunction that is non-specific in etiology and can be seen with hypoxic/ischemic injury, toxic/metabolic encephalopathies, or medication effect from Propofol. Additional focal slowing over the left centrotemporal region indicates focal cerebral dysfunction in this region suggestive of underlying structural or physiologic abnormality. There were no electrographic seizures in this study.The absence of epileptiform discharges does not rule out a clinical diagnosis of epilepsy. Clinical correlation is advised.  LE venous Doppler There is evidence of acute deep vein thrombosis involving the peroneal veins of the right lower extremity.  There is no evidence of superficial vein thrombosis involving the right lower extremity. There is no evidence of deep or superficial vein thrombosis involving the left lower extremity.  There is no evidence of a Baker's cyst bilaterally.  Carotid Doppler Findings are consistent with a 1-39 percent stenosis involving the right internal carotid artery and the left internal carotid  artery. The vertebral arteries demonstrate antegrade flow.   TEE Left ventricle: The cavity size was normal. Wall thickness was increased in a pattern of mild LVH. Systolic function was normal. The estimated ejection fraction was in the range of 60% to 65%. Wall motion was normal; there were no regional wall motion abnormalities. Features are consistent with a pseudonormal left ventricular filling pattern, with concomitant abnormal relaxation and increased filling pressure (grade 2 diastolic dysfunction). - Aortic valve: There was mild regurgitation. Valve area (VTI): 1.76 cm^2. Valve area (Vmax): 2.02 cm^2. Valve area (Vmean): 1.95 cm^2. - Mitral valve: Valve area by pressure half-time: 2.47 cm^2.   PHYSICAL EXAM  General - cachectic, on trach collar. Not following commands or open eyes  Ophthalmologic - Fundi not visualized.  Cardiovascular - Regular rhythm, but mild tachycardia.  Neuro - on trach collar, does not open eye to sternal rub or pain, left gaze fixed deviation, doll's eye present, not tracking objects. Did not blink to threat, pupil 2.5 mm bilaterally, reactive to light. Corneal reflexes preserved bilaterally and gag cough positive. Left facial droop when gimaces to pain.  Frown bilaterally. No significant movement in all 4 extremities to pain. DTR diminished, no babinski. Sensation, coordination and gait not tested.   ASSESSMENT/PLAN [name] is a 76 y.o. female with history of colon cancer, metastatic pancreatobiliary adenocarcinoma, HTN, HLD and DM presenting with progressive altered mental state, HA and double vision. She did not receive IV t-PA due to delay in arrival.   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 Infarct embolic likely secondary to hypercoagulable state from metastatic colon  cancer.  Resultant not open eyes, not following commands  CT head subacute infarct R parietal lobe, no frank hematoma   MRI  extensive multi vascular supra and infratentorial infarcts. Right petechial hemorrhage.  MRA  basilar artery and R PCA occlusion, I suspect this is chronic changes due to lack of brainstem and PCA ischemia.   Cerebral angiogram TICI 3 revascularization of occluded basilar artery  2D Echo  EF 60-65%   Carotid Doppler unremarkable   LE venous doppler  - right popliteal vein DVT  EEG diffuse slowing with superimposed left centrotemporal slowing   LDL 110  HgbA1c - 7.2  Lovenox 40 mg sq daily  for VTE prophylaxis Diet NPO time specified  No antithrombotic prior to admission, now on aspirin 325 mg daily. Will start lovenox therapeutic dose today.   Therapy recommendations:  Pending   Disposition:  Pending  Acute respiratory failure secondary to stroke   Unable to extubated d/t comatose state and brainstem stroke  trach placement 01/16/17  On trach collar  Tolerating well  Right LE DVT - likely due to hypercoagulable state secondary to metastatic colon cancer  Hold off anticoagulation due to large infarct with petechial hemorrhage and recent procedure with anemia  No IVC filter placed   Start lovenox therapeutic dose today  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 s/p liver bx 12/30/16  Likely the source of hypercoagulable state  Dysphagia  Secondary to stroke  On  tube feeding now 40cc/h with free water 240cc/Q6h  PEG placement 01/17/17  BUN/Cre stable  Hypertension  SBP goal normotensive  Resume home meds - losartan 50mg   offf Cleviprex  BP monitoring  Hyperlipidemia  Home meds:  No statin  LDL 110, goal < 70  No statin due to elevated LFTs and  liver metastasis  Diabetes  HgbA1c - 7.2  goal < 7.0  On lantus, increase to 15U bid  Resume home meds - metformin, glipizide, tradjenta  SSI  CBG still not in good control  Other Stroke Risk Factors  Advanced age  Other Active Problems  Elevated troponins - trending down, likely demand ischemia  Anemia - hemoglobin stable 7.7->7.9->8.0  Hypokalemia - supplement - 4.2  Hospital day # 10  Rosalin Hawking, MD PhD Stroke Neurology 01/19/2017 9:28 AM   To contact Stroke Continuity provider, please refer to [email or web address] After hours, contact General Neurology

## 2017-01-20 ENCOUNTER — Inpatient Hospital Stay (HOSPITAL_COMMUNITY): Payer: Medicare Other

## 2017-01-20 ENCOUNTER — Encounter (HOSPITAL_COMMUNITY): Payer: Self-pay | Admitting: General Surgery

## 2017-01-20 DIAGNOSIS — Z930 Tracheostomy status: Secondary | ICD-10-CM

## 2017-01-20 DIAGNOSIS — R509 Fever, unspecified: Secondary | ICD-10-CM

## 2017-01-20 LAB — GLUCOSE, CAPILLARY
GLUCOSE-CAPILLARY: 178 mg/dL — AB (ref 65–99)
GLUCOSE-CAPILLARY: 148 mg/dL — AB (ref 65–99)
Glucose-Capillary: 214 mg/dL — ABNORMAL HIGH (ref 65–99)
Glucose-Capillary: 94 mg/dL (ref 65–99)
Glucose-Capillary: 105 mg/dL — ABNORMAL HIGH (ref 65–99)

## 2017-01-20 LAB — BASIC METABOLIC PANEL
Anion gap: 5 (ref 5–15)
BUN: 13 mg/dL (ref 6–20)
CALCIUM: 10.2 mg/dL (ref 8.9–10.3)
CO2: 28 mmol/L (ref 22–32)
CREATININE: 0.64 mg/dL (ref 0.44–1.00)
Chloride: 106 mmol/L (ref 101–111)
GFR calc Af Amer: >60 mL/min (ref >60)
GFR calc non Af Amer: >60 mL/min (ref >60)
GLUCOSE: 223 mg/dL — AB (ref 65–99)
Potassium: 3.9 mmol/L (ref 3.5–5.1)
Sodium: 139 mmol/L (ref 135–145)

## 2017-01-20 LAB — CBC
HCT: 24.7 % — ABNORMAL LOW (ref 36.0–46.0)
Hemoglobin: 7.9 g/dL — ABNORMAL LOW (ref 12.0–15.0)
MCH: 28.2 pg (ref 26.0–34.0)
MCHC: 32 g/dL (ref 30.0–36.0)
MCV: 88.2 fL (ref 78.0–100.0)
PLATELETS: 209 10*3/uL (ref 150–400)
RBC: 2.8 MIL/uL — ABNORMAL LOW (ref 3.87–5.11)
RDW: 14.4 % (ref 11.5–15.5)
WBC: 11 10*3/uL — AB (ref 4.0–10.5)

## 2017-01-20 MED ORDER — INSULIN GLARGINE 100 UNIT/ML ~~LOC~~ SOLN
20.0000 [IU] | Freq: Two times a day (BID) | SUBCUTANEOUS | Status: DC
  Administered 2017-01-20 – 2017-01-21 (×3): 20 [IU] via SUBCUTANEOUS
  Filled 2017-01-20 (×4): qty 0.2

## 2017-01-20 MED ORDER — GLUCERNA 1.2 CAL PO LIQD
1000.0000 mL | ORAL | Status: DC
  Administered 2017-01-20 – 2017-01-29 (×7): 1000 mL
  Filled 2017-01-20 (×15): qty 1000

## 2017-01-20 MED ORDER — SODIUM CHLORIDE 0.9 % IV SOLN
3.0000 g | Freq: Four times a day (QID) | INTRAVENOUS | Status: AC
  Administered 2017-01-20 – 2017-01-29 (×36): 3 g via INTRAVENOUS
  Filled 2017-01-20 (×37): qty 3

## 2017-01-20 MED ORDER — ASPIRIN EC 325 MG PO TBEC: 325.0000 mg | DELAYED_RELEASE_TABLET | Freq: Every day | ORAL | Status: DC

## 2017-01-20 NOTE — Care Management Note (Addendum)
Case Management Note  Patient Details  Name: MAKAIYA BARRACO MRN: [id number] Date of Birth: [birth year]  Subjective/Objective:   S/p liver bx of hepatic masses with MRCP on 2/5, has hx of dm, colon cancer post resection, htn, and hld, was found to have a subacute infarct. She had revascularization in IR on 2/16.  Off vent, now on trach collar 28 %, has metastatic adenocarcinoma to liver, acute encephalopathy.  Per previous NCM note, Family DOES NOT WANT HOSPICE services for patient, they prefer to go home with [place] with [facility].  NCM spoke with daughter , Linwood Dibbles [id number] at bedside, she states she is interested in patient going to [facility] if possible, also she would like to find out who to give the medicaid application to and she would like a private duty list.  NCM will give her this information tomorrow.  Informed her that patient would have to meet criteria to go to [facility].  Patient conts on tube feeds per peg, and trach collar.  NCM will cont to follow for dc needs.              2/27 [place], BSN- NCM spoke withj Linwood Dibbles ,patient 's daughter in the room at bedside,  Informed her that patient is more of SNF candidatie then LTAC candidate ( this is per Market researcher)  NCM informed CSW that daughter is interested in SNF and would like to speak with her.  NCM gave the [place] list to [place] from [place].  CSW states she will contact [place] today.  Linwood Dibbles will take the medicaid application to DSS today.  NCM also gave her a private duty list.  NCM will cont to follow for dc needs.   3/1 [place], BSN- patient is for a cuffless trach  #6 today, will not downsize to 4 today due to volume and viscosity of secretions. , plan is for SNF when stable.  Action/Plan:   Expected Discharge Date:   (unknown)               Expected Discharge Plan:  [place]  In-House Referral:     Discharge planning Services  CM Consult  Post  Acute Care Choice:    Choice offered to:     DME Arranged:    DME Agency:     HH Arranged:    HH Agency:     Status of Service:  In process, will continue to follow  If discussed at Long Length of Stay Meetings, dates discussed:    Additional Comments:  Zenon Mayo, RN 01/20/2017, 7:35 PM

## 2017-01-20 NOTE — Progress Notes (Signed)
Nutrition Follow-up  DOCUMENTATION CODES:   Severe malnutrition in context of acute illness/injury  INTERVENTION:    Change TF to Glucerna 1.2 at 60 ml/h   Provides 1728 kcal, 86 gm protein, 1159 ml free water daily  NUTRITION DIAGNOSIS:   Malnutrition (Severe) related to acute illness as evidenced by energy intake < or equal to 50% for > or equal to 5 days, severe depletion of muscle mass, percent weight loss. Ongoing.   GOAL:   Patient will meet greater than or equal to 90% of their needs Progressing.   MONITOR:   I & O's, Vent status, Labs  ASSESSMENT:   Pt withn PMH of DM, HTN, colon cancer s/p resection 2009. 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 She underwent IR intervention with revascularization and is now in ICU on vent.  S/P trach and PEG 2/22. On trach collar since Friday, has been transferred to stepdown unit. Currently receiving Vital AF 1.2 via PEG at 40 ml/h with Prostat 30 ml BID to provide 1352 kcals, 102 gm protein, 778 ml free water daily.  CBG's remain elevated  Patient's husband stated that patient is going to inpatient rehab "upstairs" so he and the family have time to make some arrangements, then she will go home.   Diet Order:  Diet NPO time specified  Skin:  Reviewed, no issues  Last BM:  2/25  Height:   Ht Readings from Last 1 Encounters:  01/10/17 5\' 2"  (1.575 m)    Weight:   Wt Readings from Last 1 Encounters:  01/20/17 157 lb 10.1 oz (71.5 kg)    Ideal Body Weight:  50 kg  BMI:  Body mass index is 28.83 kg/m.  Estimated Nutritional Needs:   Kcal:  1500-1700  Protein:  85-100 gm  Fluid:  1.5-1.7 L  EDUCATION NEEDS:   No education needs identified at this time  Molli Barrows, [place], [place], [place] Pager [phone] After Hours Pager [phone]

## 2017-01-20 NOTE — Progress Notes (Signed)
STROKE TEAM PROGRESS NOTE   SUBJECTIVE (INTERVAL HISTORY) Patient daughter and husband are at bedside. Pt over night developed fever, concerning for aspiration pneumonia. Will send out pan culture too. Started lovenox therapeutic dose last night. Pt was able to slightly open eyes on voice today.    OBJECTIVE Temp:  [98.8 F (37.1 C)-101.4 F (38.6 C)] 99.7 F (37.6 C) (02/26 1209) Pulse Rate:  [91-113] 99 (02/26 1200) Cardiac Rhythm: Normal sinus rhythm (02/26 1200) Resp:  [12-36] 12 (02/26 1200) BP: (134-176)/(59-79) 149/63 (02/26 1200) SpO2:  [95 %-100 %] 98 % (02/26 1200) FiO2 (%):  [28 %] 28 % (02/26 1200) Weight:  [157 lb 10.1 oz (71.5 kg)] 157 lb 10.1 oz (71.5 kg) (02/26 0500)  CBC:   Recent Labs Lab 01/19/17 0305 01/20/17 0304  WBC 10.3 11.0*  HGB 8.0* 7.9*  HCT 24.6* 24.7*  MCV 87.5 88.2  PLT 202 [id number]    Basic Metabolic Panel:   Recent Labs Lab 01/17/17 1641 01/18/17 0329 01/19/17 0305 01/20/17 0304  NA  --  144 141 139  K  --  3.3* 4.2 3.9  CL  --  105 106 106  CO2  --  30 28 28   GLUCOSE  --  241* 253* 223*  BUN  --  15 14 13   CREATININE  --  0.50 0.56 0.64  CALCIUM  --  10.1 9.9 10.2  MG 2.5* 1.8  --   --   PHOS 2.7 1.7*  --   --      IMAGING I have personally reviewed the radiological images below and agree with the radiology interpretations.  Ct Head Wo Contrast 01/09/2017 1. Subacute infarct of the posterosuperior right parietal lobe with expected evolution since the CT of 01/04/2017. No hemorrhage or mass effect. MRI could be obtained for further characterization. 2. Chronic microvascular ischemia.   Mr April Franklin Head Wo Contrast 01/10/2017 Emergent basilar artery and RIGHT posterior cerebral artery occlusion. Reconstitution of basilar tip and LEFT posterior cerebral artery via LEFT posterior communicating artery.   Mr Brain Wo Contrast 01/10/2017 Acute extensive multi vascular territory supra and infratentorial infarcts. RIGHT petechial  hemorrhage without hemorrhagic conversion.   Cerebral angiogram 01/10/2017 S/P bilateral veretbral artery angiograms followed by endovascular complete revascularization of occluded basilar artery X 1pass with trevoprovue 22mm x30 mm retrieval device ,and x 2 passes with solitaire FR 101mm x 40 mm devise achieving a TICI 3 reperfusion. Also used 10.4 mg of IA superselective Integrelin  Ct Head Wo Contrast 01/10/2017 Evolving multifocal infarcts with worsening RIGHT cerebellar cytotoxic edema partially effacing the fourth ventricle. No obstructive hydrocephalus. Contrast staining and/or petechial hemorrhage RIGHT parieto-occipital lobe and RIGHT cerebellar infarcts without frank hemorrhagic conversion.   EEG -  This sedatedEEG is abnormal due to the presence of: 1. Moderatediffuse slowing of thebackground 2. Additional focal slowing over the left centrotemporal region Clinical Correlation of the above findings indicates diffuse cerebral dysfunction that is non-specific in etiology and can be seen with hypoxic/ischemic injury, toxic/metabolic encephalopathies, or medication effect from Propofol. Additional focal slowing over the left centrotemporal region indicates focal cerebral dysfunction in this region suggestive of underlying structural or physiologic abnormality. There were no electrographic seizures in this study.The absence of epileptiform discharges does not rule out a clinical diagnosis of epilepsy. Clinical correlation is advised.  LE venous Doppler There is evidence of acute deep vein thrombosis involving the peroneal veins of the right lower extremity.  There is no evidence of superficial vein thrombosis involving the right  lower extremity. There is no evidence of deep or superficial vein thrombosis involving the left lower extremity. There is no evidence of a Baker's cyst bilaterally.  Carotid Doppler Findings are consistent with a 1-39 percent stenosis involving the right  internal carotid artery and the left internal carotid artery. The vertebral arteries demonstrate antegrade flow.   TEE Left ventricle: The cavity size was normal. Wall thickness was increased in a pattern of mild LVH. Systolic function was normal. The estimated ejection fraction was in the range of 60% to 65%. Wall motion was normal; there were no regional wall motion abnormalities. Features are consistent with a pseudonormal left ventricular filling pattern, with concomitant abnormal relaxation and increased filling pressure (grade 2 diastolic dysfunction). - Aortic valve: There was mild regurgitation. Valve area (VTI): 1.76 cm^2. Valve area (Vmax): 2.02 cm^2. Valve area (Vmean): 1.95 cm^2. - Mitral valve: Valve area by pressure half-time: 2.47 cm^2.  CXR 01/20/17 Central vascular congestion without pulmonary edema. Borderline cardiomegaly. Trace bilateral pleural effusion. Hazy right basilar atelectasis or early infiltrate. Tracheostomy tube in place.    PHYSICAL EXAM  General - cachectic, on trach collar. Not following commands or open eyes.  Ophthalmologic - Fundi not visualized.  Cardiovascular - Regular rhythm, but mild tachycardia.  Neuro - on trach collar, slighted open eyes to sternal rub, downward gaze deviation, sluggish doll's eyes, not tracking objects. Did not blink to threat, pupil 2.5 mm bilaterally, reactive to light. Corneal reflexes preserved bilaterally and gag cough positive. Able to briefly stick out tongue spontaneously. Left facial droop when gimaces to pain.  Frown bilaterally. Mild withdraw to pain stimulation in all 4 extremities, L>R. DTR diminished, no babinski. Sensation, coordination and gait not tested.   ASSESSMENT/PLAN April Franklin is a 76 y.o. female with history of colon cancer, metastatic pancreatobiliary adenocarcinoma, HTN, HLD and DM presenting with progressive altered mental state, HA and double vision. She did not receive IV  t-PA due to delay in arrival.   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 Infarct embolic likely secondary to hypercoagulable state from metastatic colon cancer.  Resultant not open eyes, not following commands  CT head subacute infarct R parietal lobe, no frank hematoma   MRI  extensive multi vascular supra and infratentorial infarcts. Right petechial hemorrhage.  MRA  basilar artery and R PCA occlusion, I suspect this is chronic changes due to lack of brainstem and PCA ischemia.   Cerebral angiogram TICI 3 revascularization of occluded basilar artery  2D Echo  EF 60-65%   Carotid Doppler unremarkable   LE venous doppler  - right popliteal vein DVT  EEG diffuse slowing with superimposed left centrotemporal slowing   LDL 110  HgbA1c - 7.2  Lovenox 40 mg sq daily  for VTE prophylaxis Diet NPO time specified  No antithrombotic prior to admission, now on lovenox therapeutic dose.   Therapy recommendations:  Pending   Disposition:  Pending  Acute respiratory failure secondary to stroke   Unable to extubated d/t comatose state and brainstem stroke  trach placement 01/16/17  On trach collar  Tolerating well  Right LE DVT - likely due to hypercoagulable state secondary to metastatic colon cancer  Hold off anticoagulation due to large infarct with petechial hemorrhage and recent procedure with anemia  No IVC filter placed   on lovenox therapeutic dose   Fever   Mild leukocytosis 10.3->11.0  UA  pending  B Cx pending  CXR haze right base  Empiric treatment with unasyn  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 s/p liver bx 12/30/16  Likely  the source of hypercoagulable state  Dysphagia  Secondary to stroke  On tube feeding now 40cc/h with free water 240cc/Q6h  PEG placement 01/17/17  BUN/Cre stable  Hypertension  SBP goal normotensive  Resume home meds - losartan 50mg   offf Cleviprex  BP monitoring  Hyperlipidemia  Home meds:  No statin  LDL 110, goal < 70  No statin due to elevated LFTs and liver metastasis  Diabetes  HgbA1c - 7.2  goal < 7.0  On lantus, increase to 20 U bid  Resume home meds - metformin, glipizide, tradjenta  SSI  CBG still not in good control  Other Stroke Risk Factors  Advanced age  Other Active Problems  Elevated troponins - trending down, likely demand ischemia  Anemia - hemoglobin stable 7.7->7.9->8.0->7.9  Hypokalemia - supplement - 4.2->3.9  Hospital day # 11  The patient is at risk for aspiration, sepsis, septic shock, hemorrhage and recurrent strokes and neurological worsening and she needs ongoing stroke evaluation and aggressive risk factor modification. I had long discussion with husband and daughter at bedside, updated them about current condition, new fever and leukocytosis, treatment options and potential prognosis. They expressed understanding.   Rosalin Hawking, MD PhD Stroke Neurology 01/20/2017 2:37 PM   To contact Stroke Continuity provider, please refer to [email or web address] After hours, contact General Neurology

## 2017-01-20 NOTE — Progress Notes (Signed)
Pharmacy Antibiotic Note  April Franklin is a 76 y.o. female admitted on 01/09/2017 with subacute infarct right parietal lobe.  Overnight the patient developed fever.  Pharmacy has been consulted for Unasyn dosing for Aspiration Pneumonia. Mild leukocytosis 10.3->11.0k   Plan: Unasyn 3 g IV q6h  Monitor renal function, culture results.  Height: 5\' 2"  (157.5 cm) Weight: 157 lb 10.1 oz (71.5 kg) IBW/kg (Calculated) : 50.1  Temp (24hrs), Avg:99.9 F (37.7 C), Min:98.8 F (37.1 C), Max:101.4 F (38.6 C)   Recent Labs Lab 01/16/17 0334 01/17/17 0413 01/18/17 0329 01/19/17 0305 01/20/17 0304  WBC 5.7 6.7 8.2 10.3 11.0*  CREATININE 0.54 0.50 0.50 0.56 0.64    Estimated Creatinine Clearance: 57.2 mL/min (by C-G formula based on SCr of 0.64 mg/dL).    No Known Allergies  Antimicrobials this admission: Unasyn 2/26 >>  Dose adjustments this admission:   Microbiology results: 2/26  BCx: sent 2/26 Resp TA: sent  2/16 MRSA PCR : neg   Thank you for allowing pharmacy to be a part of this patient's care.  Kamari, [facility]lieri [facility] 01/20/2017 3:26 PM

## 2017-01-20 NOTE — Progress Notes (Addendum)
PULMONARY / CRITICAL CARE MEDICINE   Name: April Franklin MRN: [id number] DOB: [birth year]    ADMISSION DATE:  01/09/2017 CONSULTATION DATE:  01/09/2017  REFERRING MD:  Dr. [name]   CHIEF COMPLAINT:  Basilar Artery Occlusion   brief  76 y.o. female with known history of diabetes mellitus, colon cancer postresection, hypertension, and hyperlipidemia. Status post liver biopsy of hepatic masses with MRCP on 12/30/16.   On 2/15 was found to have a subacute infarct. MRI demonstrated extensive areas of infarct emerging from the basilar artery and right posterior communicating artery with occlusion. Patient underwent revascularization in interventional radiology on 2/16   SIGNIFICANT EVENTS: 02/05 - MRCP with hepatic mass biopsies 02/15 - Admit 02/16 - basilar artery revascularization in interventional radiology ETT 2/16 >> 2/21: No change in neuro status. Remains off sedation. Weaning on vent 5/5  2/22 - for palliative trach today as destination to go home with  Home hospice. sHe is DNR if arrests     SUBJECTIVE/OVERNIGHT/INTERVAL HX  on ATC, no obvious pain or resp distress Daughter at bedside  afebrile  VITAL SIGNS: BP (!) 144/73 (BP Location: Left Arm)   Pulse (!) 106   Temp 99.7 F (37.6 C) (Oral)   Resp (!) 30   Ht 5\' 2"  (1.575 m)   Wt 157 lb 10.1 oz (71.5 kg)   SpO2 97%   BMI 28.83 kg/m   HEMODYNAMICS:    VENTILATOR SETTINGS: FiO2 (%):  [28 %] 28 %  INTAKE / OUTPUT: I/O last 3 completed shifts: In: 3220 [I.V.:580; NG/GT:2640] Out: 0   PHYSICAL EXAMINATION:  Elderly, no resp distress No jvd Oropharynx clear Neck supple Lungs with a few scattered exp  rhonchi  RRR no s3 or or sign murmur Abd soft with nl excursion Extr wam with no edema or clubbing noted Neuro  No response to verbal     LABS: PULMONARY No results for input(s): PHART, PCO2ART, PO2ART, HCO3, TCO2, O2SAT in the last 168 hours.  Invalid input(s): PCO2, PO2  CBC  Recent Labs Lab  01/18/17 0329 01/19/17 0305 01/20/17 0304  HGB 7.9* 8.0* 7.9*  HCT 25.1* 24.6* 24.7*  WBC 8.2 10.3 11.0*  PLT 191 202 209    COAGULATION  Recent Labs Lab 01/15/17 1525  INR 1.18    CARDIAC   No results for input(s): TROPONINI in the last 168 hours. No results for input(s): PROBNP in the last 168 hours.   CHEMISTRY  Recent Labs Lab 01/16/17 0334 01/16/17 1412 01/17/17 0413 01/17/17 1641 01/18/17 0329 01/19/17 0305 01/20/17 0304  NA 144  --  142  --  144 141 139  K 3.5  --  3.3*  --  3.3* 4.2 3.9  CL 107  --  107  --  105 106 106  CO2 29  --  29  --  30 28 28   GLUCOSE 216*  --  248*  --  241* 253* 223*  BUN 14  --  16  --  15 14 13   CREATININE 0.54  --  0.50  --  0.50 0.56 0.64  CALCIUM 10.2  --  10.2  --  10.1 9.9 10.2  MG 1.9 1.9 1.7 2.5* 1.8  --   --   PHOS 2.4* 3.5 3.1 2.7 1.7*  --   --    Estimated Creatinine Clearance: 57.2 mL/min (by C-G formula based on SCr of 0.64 mg/dL).   LIVER  Recent Labs Lab 01/15/17 1525  INR 1.18  INFECTIOUS No results for input(s): LATICACIDVEN, PROCALCITON in the last 168 hours.   ENDOCRINE CBG (last 3)   Recent Labs  01/20/17 0300 01/20/17 0945 01/20/17 1211  GLUCAP 214* 178* 148*         IMAGING x48h  - image(s) personally visualized  -   highlighted in bold Dg Chest Port 1 View  Result Date: 01/20/2017 CLINICAL DATA:  Fever, history of tracheostomy EXAM: PORTABLE CHEST 1 VIEW COMPARISON:  01/16/2017 FINDINGS: Borderline cardiomegaly. Central vascular congestion without convincing pulmonary edema. Tracheostomy tube in place. Trace bilateral pleural effusion. Hazy right basilar atelectasis or infiltrate. IMPRESSION: Central vascular congestion without pulmonary edema. Borderline cardiomegaly. Trace bilateral pleural effusion. Hazy right basilar atelectasis or early infiltrate. Tracheostomy tube in place. Electronically Signed   By: Lahoma Crocker M.D.   On: 01/20/2017 07:50       STUDIES:  MRI/MRA  HEAD 2/15: MRI HEAD: Acute extensive multi vascular territory supra and infratentorial infarcts. RIGHT petechial hemorrhage without hemorrhagic conversion. 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 Reconstitution of basilar tip and LEFT posterior cerebral artery via LEFT posterior communicating artery. CT HEAD W/O 2/16:  Evolving multifocal infarcts with worsening RIGHT cerebellar cytotoxic edema partially effacing the fourth ventricle. No obstructive hydrocephalus. Contrast staining and/or petechial hemorrhage RIGHT parieto-occipital lobe and RIGHT cerebellar infarcts without frank hemorrhagic conversion. BILATERAL LE VENOUS DUPLEX 2/16: Acute DVT involving the peroneal veins of the right lower extremity. No evidence of superficial vein thrombosis of the right lower extremity. No evidence of deep or superficial vein thrombosis of the left lower extremity. PORT CXR 2/18:  Personally reviewed by me. No focal opacity or mass appreciated. No pleural effusion. Elevation of left hemidiaphragm. Endotracheal tube in good position. Enteric feeding tube coursing below diaphragm.  CULTURES: Urine Culture 2/10:  Multiple species present MRSA PCR 2/16:  Negative   ANTIBIOTICS: none  Patient Active Problem List   Diagnosis Date Noted  . Ventilator dependent ([place])   . Anemia of critical lillness 01/17/2017  . Hypomagnesemia 01/17/2017  . Cerebrovascular accident (CVA) due to thrombosis of precerebral artery ([place])   . Acute on chronic respiratory failure ([place]) s/p Feinstein Tracheostomy 01/16/17   . Cerebral embolism with cerebral infarction 01/10/2017  . Acute encephalopathy with coma 01/09/2017  . Elevated troponin 01/09/2017  . Type 2 diabetes mellitus with hyperglycemia, with long-term current use of insulin ([place]) 01/09/2017  . Hypercalcemia 01/09/2017  . Nonintractable headache   . Metastatic adenocarcinoma to liver ([place]) 12/30/2016  . Hypertension 02/21/2012   . At risk for hyperglycemia 02/21/2012  . Colon cancer ([place]) 02/21/2012     DISCUSSION: 76 y.o.  female with acute respiratory failure unable to protect her airway post basilar artery occlusion and revascularization on 2/16. Patient also with hepatic metastases consistent with adenocarcinoma on biopsy from 2/5. Patient's mental status and neurologic recovery seems stagnant       ASSESSMENT / PLAN:  Acute encephalopathy with coma Per neuro      Acute on chronic respiratory failure ([place]) s/p Feinstein Tracheostomy 01/16/17 Doing Tcollar since  2/23 as tol, would not resume vent Trach care  Cerebral embolism with cerebral infarction Per neuro  Metastatic adenocarcinoma to liver [facility]) Poor prognosis DNR but s/p trach and peg and want to take patient home   Plan for home hospice PCCm available as needed  Rigoberto Noel. MD

## 2017-01-20 NOTE — Care Management Important Message (Signed)
Important Message  Patient Details  Name: April Franklin MRN: [id number] Date of Birth: 09-Sep-1941   Medicare Important Message Given:  Yes    Nathen May 01/20/2017, 1:44 PM

## 2017-01-21 DIAGNOSIS — Z9189 Other specified personal risk factors, not elsewhere classified: Secondary | ICD-10-CM

## 2017-01-21 LAB — GLUCOSE, CAPILLARY
GLUCOSE-CAPILLARY: 125 mg/dL — AB (ref 65–99)
GLUCOSE-CAPILLARY: 135 mg/dL — AB (ref 65–99)
GLUCOSE-CAPILLARY: 154 mg/dL — AB (ref 65–99)
GLUCOSE-CAPILLARY: 75 mg/dL (ref 65–99)
GLUCOSE-CAPILLARY: 92 mg/dL (ref 65–99)
Glucose-Capillary: 150 mg/dL — ABNORMAL HIGH (ref 65–99)
Glucose-Capillary: 69 mg/dL (ref 65–99)

## 2017-01-21 LAB — BASIC METABOLIC PANEL
ANION GAP: 9 (ref 5–15)
BUN: 12 mg/dL (ref 6–20)
CO2: 28 mmol/L (ref 22–32)
Calcium: 10.3 mg/dL (ref 8.9–10.3)
Chloride: 106 mmol/L (ref 101–111)
Creatinine, Ser: 0.54 mg/dL (ref 0.44–1.00)
GFR calc Af Amer: >60 mL/min (ref >60)
GLUCOSE: 151 mg/dL — AB (ref 65–99)
POTASSIUM: 3.7 mmol/L (ref 3.5–5.1)
Sodium: 143 mmol/L (ref 135–145)

## 2017-01-21 LAB — CBC
HCT: 24 % — ABNORMAL LOW (ref 36.0–46.0)
Hemoglobin: 7.7 g/dL — ABNORMAL LOW (ref 12.0–15.0)
MCH: 28 pg (ref 26.0–34.0)
MCHC: 32.1 g/dL (ref 30.0–36.0)
MCV: 87.3 fL (ref 78.0–100.0)
PLATELETS: 228 10*3/uL (ref 150–400)
RBC: 2.75 MIL/uL — AB (ref 3.87–5.11)
RDW: 14.1 % (ref 11.5–15.5)
WBC: 12.1 10*3/uL — ABNORMAL HIGH (ref 4.0–10.5)

## 2017-01-21 MED ORDER — INSULIN GLARGINE 100 UNIT/ML ~~LOC~~ SOLN
15.0000 [IU] | Freq: Two times a day (BID) | SUBCUTANEOUS | Status: DC
  Administered 2017-01-21 – 2017-01-23 (×4): 15 [IU] via SUBCUTANEOUS
  Filled 2017-01-21 (×5): qty 0.15

## 2017-01-21 MED ORDER — DOCUSATE SODIUM 50 MG/5ML PO LIQD
50.0000 mg | Freq: Every day | ORAL | Status: DC
  Filled 2017-01-21 (×2): qty 10

## 2017-01-21 MED ORDER — SENNOSIDES 8.8 MG/5ML PO SYRP
5.0000 mL | ORAL_SOLUTION | Freq: Every day | ORAL | Status: DC
  Filled 2017-01-21: qty 5

## 2017-01-21 MED ORDER — LINAGLIPTIN 5 MG PO TABS
5.0000 mg | ORAL_TABLET | Freq: Every day | ORAL | Status: DC
  Administered 2017-01-21 – 2017-01-23 (×3): 5 mg
  Filled 2017-01-21 (×3): qty 1

## 2017-01-21 MED ORDER — LOSARTAN POTASSIUM 50 MG PO TABS
50.0000 mg | ORAL_TABLET | Freq: Two times a day (BID) | ORAL | Status: DC
  Administered 2017-01-21 – 2017-01-31 (×14): 50 mg
  Filled 2017-01-21 (×14): qty 1

## 2017-01-21 MED ORDER — LOSARTAN POTASSIUM 50 MG PO TABS
50.0000 mg | ORAL_TABLET | Freq: Every day | ORAL | Status: DC
  Administered 2017-01-21: 50 mg
  Filled 2017-01-21: qty 1

## 2017-01-21 MED ORDER — GLIPIZIDE 5 MG PO TABS
10.0000 mg | ORAL_TABLET | Freq: Every day | ORAL | Status: DC
  Administered 2017-01-21 – 2017-01-23 (×3): 10 mg
  Filled 2017-01-21 (×3): qty 2

## 2017-01-21 MED ORDER — METFORMIN HCL 500 MG PO TABS
1000.0000 mg | ORAL_TABLET | Freq: Every day | ORAL | Status: DC
  Administered 2017-01-22 – 2017-01-23 (×2): 1000 mg
  Filled 2017-01-21 (×2): qty 2

## 2017-01-21 MED ORDER — PANTOPRAZOLE SODIUM 40 MG PO PACK
40.0000 mg | PACK | Freq: Every day | ORAL | Status: DC
  Administered 2017-01-21 – 2017-01-31 (×8): 40 mg
  Filled 2017-01-21 (×7): qty 20

## 2017-01-21 NOTE — Progress Notes (Signed)
STROKE TEAM PROGRESS NOTE   SUBJECTIVE (INTERVAL HISTORY) Patient daughter is at bedside. Pt still has low grade fever and leukocytosis. On Abx. Discussed with pt and social worker about placement preparation. Glucose much improved.  OBJECTIVE Temp:  [98.2 F (36.8 C)-100.5 F (38.1 C)] 98.2 F (36.8 C) (02/27 1245) Pulse Rate:  [96-108] 96 (02/27 1534) Cardiac Rhythm: Sinus tachycardia (02/27 0717) Resp:  [19-34] 20 (02/27 1534) BP: (138-154)/(69-77) 138/71 (02/27 1534) SpO2:  [94 %-98 %] 98 % (02/27 1534) FiO2 (%):  [28 %] 28 % (02/27 1534) Weight:  [157 lb 13.6 oz (71.6 kg)] 157 lb 13.6 oz (71.6 kg) (02/27 0424)  CBC:   Recent Labs Lab 01/20/17 0304 01/21/17 0354  WBC 11.0* 12.1*  HGB 7.9* 7.7*  HCT 24.7* 24.0*  MCV 88.2 87.3  PLT 209 [id number]    Basic Metabolic Panel:   Recent Labs Lab 01/17/17 1641 01/18/17 0329  01/20/17 0304 01/21/17 0354  NA  --  144  < > 139 143  K  --  3.3*  < > 3.9 3.7  CL  --  105  < > 106 106  CO2  --  30  < > 28 28  GLUCOSE  --  241*  < > 223* 151*  BUN  --  15  < > 13 12  CREATININE  --  0.50  < > 0.64 0.54  CALCIUM  --  10.1  < > 10.2 10.3  MG 2.5* 1.8  --   --   --   PHOS 2.7 1.7*  --   --   --   < > = values in this interval not displayed.   IMAGING I have personally reviewed the radiological images below and agree with the radiology interpretations.  Ct Head Wo Contrast 01/09/2017 1. Subacute infarct of the posterosuperior right parietal lobe with expected evolution since the CT of 01/04/2017. No hemorrhage or mass effect. MRI could be obtained for further characterization. 2. Chronic microvascular ischemia.   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 Reconstitution of basilar tip and LEFT posterior cerebral artery via LEFT posterior communicating artery.   Mr Brain Wo Contrast 01/10/2017 Acute extensive multi vascular territory supra and infratentorial  infarcts. RIGHT petechial hemorrhage without hemorrhagic conversion.   Cerebral angiogram 01/10/2017 S/P bilateral veretbral artery angiograms followed by endovascular complete revascularization of occluded basilar artery X 1pass with trevoprovue 25mm x30 mm retrieval device ,and x 2 passes with solitaire FR 70mm x 40 mm devise achieving a TICI 3 reperfusion. Also used 10.4 mg of IA superselective Integrelin  Ct Head Wo Contrast 01/10/2017 Evolving multifocal infarcts with worsening RIGHT cerebellar cytotoxic edema partially effacing the fourth ventricle. No obstructive hydrocephalus. Contrast staining and/or petechial hemorrhage RIGHT parieto-occipital lobe and RIGHT cerebellar infarcts without frank hemorrhagic conversion.   EEG -  This sedatedEEG is abnormal due to the presence of: 1. Moderatediffuse slowing of thebackground 2. Additional focal slowing over the left centrotemporal region Clinical Correlation of the above findings indicates diffuse cerebral dysfunction that is non-specific in etiology and can be seen with hypoxic/ischemic injury, toxic/metabolic encephalopathies, or medication effect from Propofol. Additional focal slowing over the left centrotemporal region indicates focal cerebral dysfunction in this region suggestive of underlying structural or physiologic abnormality. There were no electrographic seizures in this study.The absence of epileptiform discharges does not rule out a clinical diagnosis of epilepsy. Clinical correlation is advised.  LE venous Doppler There is evidence of  acute deep vein thrombosis involving the peroneal veins of the right lower extremity.  There is no evidence of superficial vein thrombosis involving the right lower extremity. There is no evidence of deep or superficial vein thrombosis involving the left lower extremity. There is no evidence of a Baker's cyst bilaterally.  Carotid Doppler Findings are consistent with a 1-39 percent  stenosis involving the right internal carotid artery and the left internal carotid artery. The vertebral arteries demonstrate antegrade flow.   TEE Left ventricle: The cavity size was normal. Wall thickness was increased in a pattern of mild LVH. Systolic function was normal. The estimated ejection fraction was in the range of 60% to 65%. Wall motion was normal; there were no regional wall motion abnormalities. Features are consistent with a pseudonormal left ventricular filling pattern, with concomitant abnormal relaxation and increased filling pressure (grade 2 diastolic dysfunction). - Aortic valve: There was mild regurgitation. Valve area (VTI): 1.76 cm^2. Valve area (Vmax): 2.02 cm^2. Valve area (Vmean): 1.95 cm^2. - Mitral valve: Valve area by pressure half-time: 2.47 cm^2.  CXR 01/20/17 Central vascular congestion without pulmonary edema. Borderline cardiomegaly. Trace bilateral pleural effusion. Hazy right basilar atelectasis or early infiltrate. Tracheostomy tube in place.    PHYSICAL EXAM  General - cachectic, on trach collar. Not following commands or open eyes.  Ophthalmologic - Fundi not visualized.  Cardiovascular - Regular rate and rhythm.  Neuro - on trach collar, not open eyes to sternal rub, downward gaze deviation, sluggish doll's eyes, not tracking objects. Did not blink to threat, pupil 2.5 mm bilaterally, reactive to light. Corneal reflexes preserved bilaterally and gag cough positive. Left facial droop when gimaces to pain.  Frown bilaterally. Mild withdraw to pain stimulation in all 4 extremities, L>R. DTR diminished, no babinski. Sensation, coordination and gait not tested.   ASSESSMENT/PLAN Ms. WALAA RIX is a 76 y.o. female with history of colon cancer, metastatic pancreatobiliary adenocarcinoma, HTN, HLD and DM presenting with progressive altered mental state, HA and double vision. She did not receive IV t-PA due to delay in arrival.    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 Infarct embolic likely secondary to hypercoagulable state from metastatic colon cancer.  Resultant not open eyes, not following commands  CT head subacute infarct R parietal lobe, no frank hematoma   MRI  extensive multi vascular supra and infratentorial infarcts. Right petechial hemorrhage.  MRA  basilar artery and R PCA occlusion, I suspect this is chronic changes due to lack of brainstem and PCA ischemia.   Cerebral angiogram TICI 3 revascularization of occluded basilar artery  2D Echo  EF 60-65%   Carotid Doppler unremarkable   LE venous doppler  - right popliteal vein DVT  EEG diffuse slowing with superimposed left centrotemporal slowing   LDL 110  HgbA1c - 7.2  Lovenox 40 mg sq daily  for VTE prophylaxis Diet NPO time specified  No antithrombotic prior to admission, now on lovenox therapeutic dose.   Therapy recommendations:  Pending   Disposition:  Pending  Acute respiratory failure secondary to stroke   Unable to extubated d/t comatose state and brainstem stroke  trach placement 01/16/17  On trach collar with FiO2 28%  Tolerating well  Need to downsize the trach and change to uncuffed trach to prepare for SNF placement  Right LE DVT - likely due to hypercoagulable state secondary to metastatic colon cancer  Hold off anticoagulation due to large infarct with petechial hemorrhage and recent procedure with anemia  No IVC filter placed   on lovenox therapeutic dose   Fever   Mild leukocytosis 10.3->11.0 ->12.1  UA pending  B Cx pending  CXR haze right base  Empiric treatment with unasyn  Metastatic colon cancer  Colon cancer status post resection 2009  11/2016 MRI abdomen showed metastasis to liver and pancreas  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 s/p liver bx 12/30/16  Likely the source of hypercoagulable state  Dysphagia  Secondary to stroke  On tube feeding now 40cc/h with free water 240cc/Q6h  PEG placement 01/17/17  BUN/Cre stable  Hypertension  SBP goal normotensive  Resume home meds - losartan, increase to 50mg  bid  offf Cleviprex  BP monitoring  Hyperlipidemia  Home meds:  No statin  LDL 110, goal < 70  No statin due to elevated LFTs and liver metastasis  Diabetes  HgbA1c - 7.2  goal < 7.0  CBG much better controlled  On lantus, decrease to 15 U bid  Resume home meds - metformin, glipizide, tradjenta  SSI  Other Stroke Risk Factors  Advanced age  Other Active Problems  Elevated troponins - trending down, likely demand ischemia  Anemia - hemoglobin stable 7.7->7.9->8.0->7.9->7.7  Hypokalemia - supplement - 4.2->3.9->3.7  Hospital day # 12   Rosalin Hawking, MD PhD Stroke Neurology 01/21/2017 6:57 PM   To contact Stroke Continuity provider, please refer to [email or web address] After hours, contact General Neurology

## 2017-01-21 NOTE — Progress Notes (Signed)
Paged [place], [place] about changing patient's medications to be given via tube since they are all currently ordered as PO. Correct route needs to updated since patient is NPO and unable to swallow.

## 2017-01-21 NOTE — Clinical Social Work Note (Signed)
Clinical Social Work Assessment  Patient Details  Name: April Franklin MRN: [id number] Date of Birth: 1941/10/27  Date of referral:  01/21/17               Reason for consult:  Facility Placement, Discharge Planning                Permission sought to share information with:  Facility Sport and exercise psychologist, Family Supports Permission granted to share information::  Yes, Verbal Permission Granted  Name::     [employer] engineer::  SNF's  Relationship::  Daughter  Contact Information:  [phone]  Housing/Transportation Living arrangements for the past 2 months:  Single Family Home Source of Information:  Medical Team, Adult Children Patient Interpreter Needed:  None Criminal Activity/Legal Involvement Pertinent to Current Situation/Hospitalization:  No - Comment as needed Significant Relationships:  Adult Children, Spouse, Community Support Lives with:  Spouse Do you feel safe going back to the place where you live?  No Need for family participation in patient care:  Yes (Comment)  Care giving concerns:  Patient in need of a SNF for trach management.   Social Worker assessment / plan:  Patient not oriented or awake. Daughter at bedside. CSW introduced role and explained that discharge planning would be discussed. She has the SNF list that [facility] provided to her. Patient's daughter is agreeable to SNF placement. CSW explained barriers with cuffed trach. There is only 1-2 SNF's in this area that can accommodate that. If patient gets an uncuffed trach, that will increase her options. Patient's daughter expressed understanding. RNCM has discussed this with MD and he will discuss with PCCM. No further concerns. CSW will wait to do FL2 and fax out referral until determination has been made about cuffed/uncuffed trach. CSW encouraged patient's daughter to contact CSW as needed. CSW will continue to follow patient and her family for support and facilitate discharge to SNF once medically  stable.  Employment status:  Retired Forensic scientist:  Medicare PT Recommendations:  Not assessed at this time [place] / Referral to community resources:  [place]  Patient/Family's Response to care:  Patient not oriented or awake. Patient's daughter agreeable to SNF placement. Patient's family supportive and involved in patient's care. Patient's daughter appreciated social work intervention.  Patient/Family's Understanding of and Emotional Response to Diagnosis, Current Treatment, and Prognosis:  Patient not oriented or awake. Patient's daughter appears to have a good understanding of the reason for admission. Patient's daughter appears happy with hospital care.  Emotional Assessment Appearance:  Appears stated age Attitude/Demeanor/Rapport:  Unable to Assess Affect (typically observed):  Unable to Assess Orientation:    Alcohol / Substance use:  Never Used Psych involvement (Current and /or in the community):  No (Comment)  Discharge Needs  Concerns to be addressed:  Care Coordination Readmission within the last 30 days:  No Current discharge risk:  Dependent with Mobility, Cognitively Impaired Barriers to Discharge:  Continued Medical Work up, Other (Cuffed trach)   Candie Chroman, LCSW 01/21/2017, 12:44 PM

## 2017-01-21 NOTE — Progress Notes (Signed)
Spoke with pharmacist who is going to work on changing the route of medications to be per tube.

## 2017-01-22 DIAGNOSIS — R509 Fever, unspecified: Secondary | ICD-10-CM

## 2017-01-22 LAB — BASIC METABOLIC PANEL
ANION GAP: 8 (ref 5–15)
BUN: 10 mg/dL (ref 6–20)
CO2: 27 mmol/L (ref 22–32)
Calcium: 10.2 mg/dL (ref 8.9–10.3)
Chloride: 108 mmol/L (ref 101–111)
Creatinine, Ser: 0.58 mg/dL (ref 0.44–1.00)
GFR calc Af Amer: >60 mL/min (ref >60)
GLUCOSE: 97 mg/dL (ref 65–99)
POTASSIUM: 3.8 mmol/L (ref 3.5–5.1)
Sodium: 143 mmol/L (ref 135–145)

## 2017-01-22 LAB — CBC
HCT: 24 % — ABNORMAL LOW (ref 36.0–46.0)
HEMOGLOBIN: 7.6 g/dL — AB (ref 12.0–15.0)
MCH: 27.7 pg (ref 26.0–34.0)
MCHC: 31.7 g/dL (ref 30.0–36.0)
MCV: 87.6 fL (ref 78.0–100.0)
PLATELETS: 263 10*3/uL (ref 150–400)
RBC: 2.74 MIL/uL — AB (ref 3.87–5.11)
RDW: 14.3 % (ref 11.5–15.5)
WBC: 9.9 10*3/uL (ref 4.0–10.5)

## 2017-01-22 LAB — GLUCOSE, CAPILLARY
GLUCOSE-CAPILLARY: 100 mg/dL — AB (ref 65–99)
Glucose-Capillary: 127 mg/dL — ABNORMAL HIGH (ref 65–99)
Glucose-Capillary: 140 mg/dL — ABNORMAL HIGH (ref 65–99)
Glucose-Capillary: 144 mg/dL — ABNORMAL HIGH (ref 65–99)
Glucose-Capillary: 155 mg/dL — ABNORMAL HIGH (ref 65–99)
Glucose-Capillary: 66 mg/dL (ref 65–99)
Glucose-Capillary: 66 mg/dL (ref 65–99)

## 2017-01-22 LAB — URINALYSIS, COMPLETE (UACMP) WITH MICROSCOPIC
BILIRUBIN URINE: NEGATIVE
GLUCOSE, UA: 50 mg/dL — AB
Hgb urine dipstick: NEGATIVE
KETONES UR: NEGATIVE mg/dL
LEUKOCYTES UA: NEGATIVE
NITRITE: NEGATIVE
PH: 6 (ref 5.0–8.0)
Protein, ur: 30 mg/dL — AB
Specific Gravity, Urine: 1.026 (ref 1.005–1.030)
Squamous Epithelial / HPF: NONE SEEN

## 2017-01-22 MED ORDER — DEXTROSE 50 % IV SOLN
INTRAVENOUS | Status: AC
  Administered 2017-01-22: 25 mL
  Filled 2017-01-22: qty 50

## 2017-01-22 NOTE — Progress Notes (Signed)
Informed Dr. Leonel Ramsay that patient vomited tube feeding with trach in place. Per MD since trach is cuffed monitor patient for SOB and inform Md.

## 2017-01-22 NOTE — Progress Notes (Signed)
STROKE TEAM PROGRESS NOTE   SUBJECTIVE (INTERVAL HISTORY) Patient daughter is at bedside. Pt afebrile today and WBC improved. continue Abx. Discussed CCM and will downsize trach and change to uncuffed trach tomorrow. Pt seems more awake alert and following some simple commands.  OBJECTIVE Temp:  [98.1 F (36.7 C)-99.9 F (37.7 C)] 98.7 F (37.1 C) (02/28 1600) Pulse Rate:  [90-100] 93 (02/28 1600) Cardiac Rhythm: Normal sinus rhythm (02/28 0800) Resp:  [20-37] 26 (02/28 1600) BP: (120-150)/(55-80) 147/69 (02/28 1600) SpO2:  [94 %-100 %] 96 % (02/28 1600) FiO2 (%):  [28 %] 28 % (02/28 1525) Weight:  [154 lb 12.2 oz (70.2 kg)] 154 lb 12.2 oz (70.2 kg) (02/28 0300)  CBC:   Recent Labs Lab 01/21/17 0354 01/22/17 1204  WBC 12.1* 9.9  HGB 7.7* 7.6*  HCT 24.0* 24.0*  MCV 87.3 87.6  PLT 228 [id number]    Basic Metabolic Panel:   Recent Labs Lab 01/17/17 1641 01/18/17 0329  01/21/17 0354 01/22/17 1204  NA  --  144  < > 143 143  K  --  3.3*  < > 3.7 3.8  CL  --  105  < > 106 108  CO2  --  30  < > 28 27  GLUCOSE  --  241*  < > 151* 97  BUN  --  15  < > 12 10  CREATININE  --  0.50  < > 0.54 0.58  CALCIUM  --  10.1  < > 10.3 10.2  MG 2.5* 1.8  --   --   --   PHOS 2.7 1.7*  --   --   --   < > = values in this interval not displayed.   IMAGING I have personally reviewed the radiological images below and agree with the radiology interpretations.  Ct Head Wo Contrast 01/09/2017 1. Subacute infarct of the posterosuperior right parietal lobe with expected evolution since the CT of 01/04/2017. No hemorrhage or mass effect. MRI could be obtained for further characterization. 2. Chronic microvascular ischemia.   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 Reconstitution of basilar tip and LEFT posterior cerebral artery via LEFT posterior communicating artery.   Mr Brain Wo Contrast 01/10/2017 Acute extensive multi  vascular territory supra and infratentorial infarcts. RIGHT petechial hemorrhage without hemorrhagic conversion.   Cerebral angiogram 01/10/2017 S/P bilateral veretbral artery angiograms followed by endovascular complete revascularization of occluded basilar artery X 1pass with trevoprovue 68mm x30 mm retrieval device ,and x 2 passes with solitaire FR 1mm x 40 mm devise achieving a TICI 3 reperfusion. Also used 10.4 mg of IA superselective Integrelin  Ct Head Wo Contrast 01/10/2017 Evolving multifocal infarcts with worsening RIGHT cerebellar cytotoxic edema partially effacing the fourth ventricle. No obstructive hydrocephalus. Contrast staining and/or petechial hemorrhage RIGHT parieto-occipital lobe and RIGHT cerebellar infarcts without frank hemorrhagic conversion.   EEG -  This sedatedEEG is abnormal due to the presence of: 1. Moderatediffuse slowing of thebackground 2. Additional focal slowing over the left centrotemporal region Clinical Correlation of the above findings indicates diffuse cerebral dysfunction that is non-specific in etiology and can be seen with hypoxic/ischemic injury, toxic/metabolic encephalopathies, or medication effect from Propofol. Additional focal slowing over the left centrotemporal region indicates focal cerebral dysfunction in this region suggestive of underlying structural or physiologic abnormality. There were no electrographic seizures in this study.The absence of epileptiform discharges does not rule out a clinical diagnosis of epilepsy. Clinical correlation is  advised.  LE venous Doppler There is evidence of acute deep vein thrombosis involving the peroneal veins of the right lower extremity.  There is no evidence of superficial vein thrombosis involving the right lower extremity. There is no evidence of deep or superficial vein thrombosis involving the left lower extremity. There is no evidence of a Baker's cyst bilaterally.  Carotid  Doppler Findings are consistent with a 1-39 percent stenosis involving the right internal carotid artery and the left internal carotid artery. The vertebral arteries demonstrate antegrade flow.   TEE Left ventricle: The cavity size was normal. Wall thickness was increased in a pattern of mild LVH. Systolic function was normal. The estimated ejection fraction was in the range of 60% to 65%. Wall motion was normal; there were no regional wall motion abnormalities. Features are consistent with a pseudonormal left ventricular filling pattern, with concomitant abnormal relaxation and increased filling pressure (grade 2 diastolic dysfunction). - Aortic valve: There was mild regurgitation. Valve area (VTI): 1.76 cm^2. Valve area (Vmax): 2.02 cm^2. Valve area (Vmean): 1.95 cm^2. - Mitral valve: Valve area by pressure half-time: 2.47 cm^2.  CXR 01/20/17 Central vascular congestion without pulmonary edema. Borderline cardiomegaly. Trace bilateral pleural effusion. Hazy right basilar atelectasis or early infiltrate. Tracheostomy tube in place.    PHYSICAL EXAM  General - cachectic, on trach collar, not in distress  Ophthalmologic - Fundi not visualized.  Cardiovascular - Regular rate and rhythm.  Neuro - on trach collar, barely open eyes to sternal rub, downward gaze deviation, sluggish doll's eyes, not tracking objects. Did not blink to threat, pupil 2.5 mm bilaterally, reactive to light. Corneal reflexes preserved bilaterally and gag cough positive. Able to stick out tongue and wiggle left toes on commands. Left facial droop when gimaces to pain.  Frown bilaterally. Mild withdraw to pain stimulation in all 4 extremities, L>R. DTR diminished, no babinski. Sensation, coordination and gait not tested.   ASSESSMENT/PLAN April Franklin is a 76 y.o. female with history of colon cancer, metastatic pancreatobiliary adenocarcinoma, HTN, HLD and DM presenting with progressive altered  mental state, HA and double vision. She did not receive IV t-PA due to delay in arrival.   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 Infarct embolic likely secondary to hypercoagulable state from metastatic colon cancer.  Resultant not open eyes, not following commands  CT head subacute infarct R parietal lobe, no frank hematoma   MRI  extensive multi vascular supra and infratentorial infarcts. Right petechial hemorrhage.  MRA  basilar artery and R PCA occlusion, I suspect this is chronic changes due to lack of brainstem and PCA ischemia.   Cerebral angiogram TICI 3 revascularization of occluded basilar artery  2D Echo  EF 60-65%   Carotid Doppler unremarkable   LE venous doppler  - right popliteal vein DVT  EEG diffuse slowing with superimposed left centrotemporal slowing   LDL 110  HgbA1c - 7.2  Lovenox 40 mg sq daily  for VTE prophylaxis Diet NPO time specified  No antithrombotic prior to admission, now on lovenox therapeutic dose.   Therapy recommendations:  SNF   Disposition:  Pending  Acute respiratory failure secondary to stroke   Unable to extubated d/t comatose state and brainstem stroke  trach placement 01/16/17  On trach collar with FiO2 28%  Tolerating well  downsize the trach and change to uncuffed trach tomorrow  Right LE DVT -  likely due to hypercoagulable state secondary to metastatic colon cancer  Hold off anticoagulation due to large infarct with petechial hemorrhage and recent procedure with anemia  No IVC filter placed   on lovenox therapeutic dose   Fever - improving  Mild leukocytosis 10.3->11.0 ->12.1 ->9.9  UA negative  B Cx NGTD  CXR haze right base 01/20/17  continue unasyn  Metastatic colon cancer  Colon cancer status post resection 2009  11/2016 MRI abdomen showed  metastasis to liver and pancreas  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 s/p liver bx 12/30/16  Likely the source of hypercoagulable state  Dysphagia  Secondary to stroke  On tube feeding now 40cc/h with free water 240cc/Q6h  PEG placement 01/17/17  BUN/Cre stable  Hypertension  SBP goal normotensive  Resume home meds - losartan, increase to 50mg  bid  offf Cleviprex  BP monitoring  Hyperlipidemia  Home meds:  No statin  LDL 110, goal < 70  No statin due to elevated LFTs and liver metastasis  Diabetes  HgbA1c - 7.2  goal < 7.0  CBG much better controlled  On lantus, decrease to 15 U bid  Resume home meds - metformin, glipizide, tradjenta  SSI  Other Stroke Risk Factors  Advanced age  Other Active Problems  Elevated troponins - trending down, likely demand ischemia  Anemia - hemoglobin stable 7.7->7.9->8.0->7.9->7.7->7.6  Hypokalemia - resolved  Hospital day # 13   Rosalin Hawking, MD PhD Stroke Neurology 01/22/2017 6:49 PM   To contact Stroke Continuity provider, please refer to [email or web address] After hours, contact General Neurology

## 2017-01-22 NOTE — NC FL2 (Signed)
[place] LEVEL OF CARE SCREENING TOOL     IDENTIFICATION  Patient Name: April Franklin Birthdate: [birth year] Sex: female Admission Date (Current Location): 01/09/2017  [facility] and [place] Number:  Herbalist and Address:  [facility], [place] [address], [place], [place] [address]      Provider Number: [id number]  Attending Physician Name and Address:  Rosalin Hawking, MD  Relative Name and Phone Number:       Current Level of Care: Hospital Recommended Level of Care: [place] Prior Approval Number:    Date Approved/Denied:   PASRR Number: [id number]  Discharge Plan: SNF    Current Diagnoses: Patient Active Problem List   Diagnosis Date Noted  . Tracheostomy status ([place])   . Ventilator dependent ([place])   . Anemia of critical lillness 01/17/2017  . Hypomagnesemia 01/17/2017  . Cerebrovascular accident (CVA) due to thrombosis of precerebral artery ([place])   . Acute on chronic respiratory failure ([place]) s/p Feinstein Tracheostomy 01/16/17   . Cerebral embolism with cerebral infarction 01/10/2017  . Acute encephalopathy with coma 01/09/2017  . Elevated troponin 01/09/2017  . Type 2 diabetes mellitus with hyperglycemia, with long-term current use of insulin ([place]) 01/09/2017  . Hypercalcemia 01/09/2017  . Nonintractable headache   . Metastatic adenocarcinoma to liver ([place]) 12/30/2016  . Hypertension 02/21/2012  . At risk for hyperglycemia 02/21/2012  . Colon cancer ([place]) 02/21/2012    Orientation RESPIRATION BLADDER Height & Weight      (unable to assess fully)  Tracheostomy, O2 (51mm shiley, 28% FiO2) Incontinent Weight: 154 lb 12.2 oz (70.2 kg) Height:  5\' 2"  (157.5 cm)  BEHAVIORAL SYMPTOMS/MOOD NEUROLOGICAL BOWEL NUTRITION STATUS      Incontinent Feeding tube  AMBULATORY STATUS COMMUNICATION OF NEEDS Skin   Total Care Does not communicate Surgical wounds                       Personal Care Assistance  Level of Assistance  Bathing, Dressing, Feeding Bathing Assistance: Maximum assistance Feeding assistance: Maximum assistance Dressing Assistance: Maximum assistance     Functional Limitations Info  Speech     Speech Info: Impaired    SPECIAL CARE FACTORS FREQUENCY  PT (By licensed PT)                    Contractures      Additional Factors Info  Code Status, Allergies, Insulin Sliding Scale, Suctioning Needs Code Status Info: DNR Allergies Info: NKA   Insulin Sliding Scale Info: 8/day       Current Medications (01/22/2017):  This is the current hospital active medication list Current Facility-Administered Medications  Medication Dose Route Frequency Provider Last Rate Last Dose  . 0.9 % NaCl with KCl 20 mEq/ L  infusion   Intravenous Continuous Erick Colace, NP 10 mL/hr at 01/20/17 0600    . acetaminophen (TYLENOL) tablet 650 mg  650 mg Oral Q4H PRN Luanne Bras, MD       Or  . acetaminophen (TYLENOL) solution 650 mg  650 mg Per Tube Q4H PRN Luanne Bras, MD   650 mg at 01/21/17 1202   Or  . acetaminophen (TYLENOL) suppository 650 mg  650 mg Rectal Q4H PRN Luanne Bras, MD      . Ampicillin-Sulbactam (UNASYN) 3 g in sodium chloride 0.9 % 100 mL IVPB  3 g Intravenous Q6H Rosalin Hawking, MD   3 g at 01/22/17 0855  .  chlorhexidine (PERIDEX) 0.12 % solution 15 mL  15 mL Mouth Rinse BID Rosalin Hawking, MD   15 mL at 01/22/17 0849  . sennosides (SENOKOT) 8.8 MG/5ML syrup 5 mL  5 mL Per Tube QHS Skeet Simmer, RPH       And  . docusate (COLACE) 50 MG/5ML liquid 50 mg  50 mg Per Tube QHS Skeet Simmer, RPH      . enoxaparin (LOVENOX) injection 70 mg  70 mg Subcutaneous Q12H Rosalin Hawking, MD   70 mg at 01/22/17 0848  . feeding supplement (GLUCERNA 1.2 CAL) liquid 1,000 mL  1,000 mL Per Tube Continuous Rosalin Hawking, MD 60 mL/hr at 01/22/17 0037 1,000 mL at 01/22/17 0037  . fentaNYL (SUBLIMAZE) injection 50 mcg  50 mcg Intravenous Q2H PRN Corey Harold, NP      . free  water 240 mL  240 mL Per Tube Q6H Erick Colace, NP   240 mL at 01/22/17 1400  . glipiZIDE (GLUCOTROL) tablet 10 mg  10 mg Per Tube QAC breakfast Skeet Simmer, RPH   10 mg at 01/22/17 [id number]  . hydrALAZINE (APRESOLINE) injection 5 mg  5 mg Intravenous Q6H PRN Rosalin Hawking, MD      . insulin aspart (novoLOG) injection 0-15 Units  0-15 Units Subcutaneous Q4H Corey Harold, NP   2 Units at 01/22/17 [phone]  . insulin glargine (LANTUS) injection 15 Units  15 Units Subcutaneous BID Rosalin Hawking, MD   15 Units at 01/22/17 0848  . linagliptin (TRADJENTA) tablet 5 mg  5 mg Per Tube Daily Skeet Simmer, RPH   5 mg at 01/22/17 0855  . losartan (COZAAR) tablet 50 mg  50 mg Per Tube BID Rosalin Hawking, MD   50 mg at 01/22/17 0855  . magnesium hydroxide (MILK OF MAGNESIA) suspension 30 mL  30 mL Per Tube Daily PRN Erick Colace, NP   30 mL at 01/15/17 1744  . MEDLINE mouth rinse  15 mL Mouth Rinse q12n4p Rosalin Hawking, MD   15 mL at 01/22/17 1247  . metFORMIN (GLUCOPHAGE) tablet 1,000 mg  1,000 mg Per Tube Q breakfast Skeet Simmer, RPH   1,000 mg at 01/22/17 [id number]  . ondansetron (ZOFRAN) injection 4 mg  4 mg Intravenous Q6H PRN Luanne Bras, MD      . pantoprazole sodium (PROTONIX) 40 mg/20 mL oral suspension 40 mg  40 mg Per Tube Daily Skeet Simmer, RPH   40 mg at 01/22/17 0849  . sodium phosphate (FLEET) 7-19 GM/118ML enema 1 enema  1 enema Rectal Daily PRN Erick Colace, NP   1 enema at 01/16/17 1623     Discharge Medications: Please see discharge summary for a list of discharge medications.  Relevant Imaging Results:  Relevant Lab Results:   Additional Information SS#: [id number]  Jorge Ny, LCSW

## 2017-01-22 NOTE — Progress Notes (Signed)
PULMONARY / CRITICAL CARE MEDICINE   Name: April Franklin MRN: [id number] DOB: December 21, 1940    ADMISSION DATE:  01/09/2017 CONSULTATION DATE:  01/09/2017  REFERRING MD:  Dr. [name]   CHIEF COMPLAINT:  Basilar Artery Occlusion   brief  76 y.o. female with known history of diabetes mellitus, colon cancer postresection, hypertension, and hyperlipidemia. Status post liver biopsy of hepatic masses with MRCP on 12/30/16.   On 2/15 was found to have a subacute infarct. MRI demonstrated extensive areas of infarct emerging from the basilar artery and right posterior communicating artery with occlusion. Patient underwent revascularization in interventional radiology on 2/16   2/28 plan is now to go to SNF.  SIGNIFICANT EVENTS: 02/05 - MRCP with hepatic mass biopsies 02/15 - Admit 02/16 - basilar artery revascularization in interventional radiology ETT 2/16 >>2/22 2/21: No change in neuro status. Remains off sedation. Weaning on vent 5/5  2/22 - trached 2/28 plan is now for snf ,will need cuffless trach     SUBJECTIVE/OVERNIGHT/INTERVAL HX  on ATC, no obvious pain or resp distress  VITAL SIGNS: BP 123/60 (BP Location: Left Arm)   Pulse 100   Temp 98.1 F (36.7 C) (Axillary)   Resp (!) 36   Ht 5\' 2"  (1.575 m)   Wt 154 lb 12.2 oz (70.2 kg)   SpO2 96%   BMI 28.31 kg/m   HEMODYNAMICS:    VENTILATOR SETTINGS: FiO2 (%):  [28 %] 28 %  INTAKE / OUTPUT: I/O last 3 completed shifts: In: 2184.7 [I.V.:200.7; [id number]; IV Piggyback:300] Out: 0   PHYSICAL EXAMINATION:  Elderly, no resp distress No jvd Oropharynx clear Neck supple, #6 cuffed trach in place with sutures in place and bloody secretions  Lungs with a few scattered exp  rhonchi  RRR no s3 or or sign murmur Abd soft with nl excursion Extr wam with no edema or clubbing noted Neuro  No response to verbal     LABS: PULMONARY No results for input(s): PHART, PCO2ART, PO2ART, HCO3, TCO2, O2SAT in the last 168  hours.  Invalid input(s): PCO2, PO2  CBC  Recent Labs Lab 01/19/17 0305 01/20/17 0304 01/21/17 0354  HGB 8.0* 7.9* 7.7*  HCT 24.6* 24.7* 24.0*  WBC 10.3 11.0* 12.1*  PLT 202 209 228    COAGULATION  Recent Labs Lab 01/15/17 1525  INR 1.18    CARDIAC   No results for input(s): TROPONINI in the last 168 hours. No results for input(s): PROBNP in the last 168 hours.   CHEMISTRY  Recent Labs Lab 01/16/17 0334 01/16/17 1412 01/17/17 0413 01/17/17 1641 01/18/17 0329 01/19/17 0305 01/20/17 0304 01/21/17 0354  NA 144  --  142  --  144 141 139 143  K 3.5  --  3.3*  --  3.3* 4.2 3.9 3.7  CL 107  --  107  --  105 106 106 106  CO2 29  --  29  --  30 28 28 28   GLUCOSE 216*  --  248*  --  241* 253* 223* 151*  BUN 14  --  16  --  15 14 13 12   CREATININE 0.54  --  0.50  --  0.50 0.56 0.64 0.54  CALCIUM 10.2  --  10.2  --  10.1 9.9 10.2 10.3  MG 1.9 1.9 1.7 2.5* 1.8  --   --   --   PHOS 2.4* 3.5 3.1 2.7 1.7*  --   --   --    Estimated Creatinine Clearance: 56.6  mL/min (by C-G formula based on SCr of 0.54 mg/dL).   LIVER  Recent Labs Lab 01/15/17 1525  INR 1.18     INFECTIOUS No results for input(s): LATICACIDVEN, PROCALCITON in the last 168 hours.   ENDOCRINE CBG (last 3)   Recent Labs  01/21/17 1956 01/21/17 2353 01/22/17 0410  GLUCAP 92 135* 140*         IMAGING x48h  - image(s) personally visualized  -   highlighted in bold No results found.     STUDIES:  MRI/MRA HEAD 2/15: MRI HEAD: Acute extensive multi vascular territory supra and infratentorial infarcts. RIGHT petechial hemorrhage without hemorrhagic conversion. 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 Reconstitution of basilar tip and LEFT posterior cerebral artery via LEFT posterior communicating artery. CT HEAD W/O 2/16:  Evolving multifocal infarcts with worsening RIGHT cerebellar cytotoxic edema partially effacing the fourth ventricle. No  obstructive hydrocephalus. Contrast staining and/or petechial hemorrhage RIGHT parieto-occipital lobe and RIGHT cerebellar infarcts without frank hemorrhagic conversion. BILATERAL LE VENOUS DUPLEX 2/16: Acute DVT involving the peroneal veins of the right lower extremity. No evidence of superficial vein thrombosis of the right lower extremity. No evidence of deep or superficial vein thrombosis of the left lower extremity. PORT CXR 2/18:  Personally reviewed by me. No focal opacity or mass appreciated. No pleural effusion. Elevation of left hemidiaphragm. Endotracheal tube in good position. Enteric feeding tube coursing below diaphragm.  CULTURES: Urine Culture 2/10:  Multiple species present MRSA PCR 2/16:  Negative   ANTIBIOTICS: none  Patient Active Problem List   Diagnosis Date Noted  . Tracheostomy status ([place])   . Ventilator dependent ([place])   . Anemia of critical lillness 01/17/2017  . Hypomagnesemia 01/17/2017  . Cerebrovascular accident (CVA) due to thrombosis of precerebral artery ([place])   . Acute on chronic respiratory failure ([place]) s/p Feinstein Tracheostomy 01/16/17   . Cerebral embolism with cerebral infarction 01/10/2017  . Acute encephalopathy with coma 01/09/2017  . Elevated troponin 01/09/2017  . Type 2 diabetes mellitus with hyperglycemia, with long-term current use of insulin ([place]) 01/09/2017  . Hypercalcemia 01/09/2017  . Nonintractable headache   . Metastatic adenocarcinoma to liver ([place]) 12/30/2016  . Hypertension 02/21/2012  . At risk for hyperglycemia 02/21/2012  . Colon cancer ([place]) 02/21/2012     DISCUSSION: 76 y.o.  female with acute respiratory failure unable to protect her airway post basilar artery occlusion and revascularization on 2/16. Patient also with hepatic metastases consistent with adenocarcinoma on biopsy from 2/5. Patient's mental status and neurologic recovery remains stagnant       ASSESSMENT / PLAN:  Acute encephalopathy with coma Per  neuro      Acute on chronic respiratory failure ([place]) s/p Feinstein Tracheostomy 01/16/17 Doing Tcollar since  2/23 as tol, would not resume vent Trach care 2/28 remove sutures from trach. Discuss with MD on changing to cuffless trach.  Cerebral embolism with cerebral infarction Per neuro  Metastatic adenocarcinoma to liver [facility]) Poor prognosis DNR but s/p trach and peg and she is to go to snf once cuffless trach placed.   Plan is for SNF but barrier is cuffed trach placed on 2/22 with sutures in place. 2/28 PCCM called back for changing trach to cuffless.   Richardson Landry Minor ACNP Maryanna Shape PCCM Pager [phone] till 3 pm If no answer page [phone] 01/22/2017, 8:18 AM

## 2017-01-22 NOTE — Progress Notes (Signed)
Trach sutures were removed.  No complications with the procedure.

## 2017-01-23 DIAGNOSIS — J690 Pneumonitis due to inhalation of food and vomit: Secondary | ICD-10-CM

## 2017-01-23 DIAGNOSIS — I82409 Acute embolism and thrombosis of unspecified deep veins of unspecified lower extremity: Secondary | ICD-10-CM

## 2017-01-23 DIAGNOSIS — R42 Dizziness and giddiness: Secondary | ICD-10-CM

## 2017-01-23 HISTORY — DX: Acute embolism and thrombosis of unspecified deep veins of unspecified lower extremity: I82.409

## 2017-01-23 LAB — CBC
HCT: 22.6 % — ABNORMAL LOW (ref 36.0–46.0)
Hemoglobin: 7.1 g/dL — ABNORMAL LOW (ref 12.0–15.0)
MCH: 27.4 pg (ref 26.0–34.0)
MCHC: 31.4 g/dL (ref 30.0–36.0)
MCV: 87.3 fL (ref 78.0–100.0)
PLATELETS: 263 10*3/uL (ref 150–400)
RBC: 2.59 MIL/uL — ABNORMAL LOW (ref 3.87–5.11)
RDW: 14.3 % (ref 11.5–15.5)
WBC: 8.6 10*3/uL (ref 4.0–10.5)

## 2017-01-23 LAB — GLUCOSE, CAPILLARY
GLUCOSE-CAPILLARY: 69 mg/dL (ref 65–99)
GLUCOSE-CAPILLARY: 72 mg/dL (ref 65–99)
GLUCOSE-CAPILLARY: 90 mg/dL (ref 65–99)
GLUCOSE-CAPILLARY: 96 mg/dL (ref 65–99)
Glucose-Capillary: 88 mg/dL (ref 65–99)
Glucose-Capillary: 130 mg/dL — ABNORMAL HIGH (ref 65–99)
Glucose-Capillary: 133 mg/dL — ABNORMAL HIGH (ref 65–99)

## 2017-01-23 LAB — C DIFFICILE QUICK SCREEN W PCR REFLEX
C DIFFICILE (CDIFF) TOXIN: NEGATIVE
C DIFFICLE (CDIFF) ANTIGEN: NEGATIVE
C Diff interpretation: NOT DETECTED

## 2017-01-23 LAB — BASIC METABOLIC PANEL
Anion gap: 5 (ref 5–15)
BUN: 8 mg/dL (ref 6–20)
CO2: 29 mmol/L (ref 22–32)
Calcium: 9.9 mg/dL (ref 8.9–10.3)
Chloride: 108 mmol/L (ref 101–111)
Creatinine, Ser: 0.54 mg/dL (ref 0.44–1.00)
GFR calc Af Amer: >60 mL/min (ref >60)
GLUCOSE: 90 mg/dL (ref 65–99)
Potassium: 3.4 mmol/L — ABNORMAL LOW (ref 3.5–5.1)
SODIUM: 142 mmol/L (ref 135–145)

## 2017-01-23 MED ORDER — DEXTROSE 50 % IV SOLN
INTRAVENOUS | Status: AC
  Administered 2017-01-24: 25 mL
  Filled 2017-01-23: qty 50

## 2017-01-23 MED ORDER — KCL IN DEXTROSE-NACL 20-5-0.9 MEQ/L-%-% IV SOLN
INTRAVENOUS | Status: DC
  Administered 2017-01-23: 23:00:00 via INTRAVENOUS
  Filled 2017-01-23 (×2): qty 1000

## 2017-01-23 NOTE — Progress Notes (Signed)
PULMONARY / CRITICAL CARE MEDICINE   Name: April Franklin MRN: [id number] DOB: 24-May-1941    ADMISSION DATE:  01/09/2017 CONSULTATION DATE:  01/09/2017  REFERRING MD:  Dr. [name]   CHIEF COMPLAINT:  Basilar Artery Occlusion   brief  76 y.o. female with known history of diabetes mellitus, colon cancer postresection, hypertension, and hyperlipidemia. Status post liver biopsy of hepatic masses with MRCP on 12/30/16.   On 2/15 was found to have a subacute infarct. MRI demonstrated extensive areas of infarct emerging from the basilar artery and right posterior communicating artery with occlusion. Patient underwent revascularization in interventional radiology on 2/16   2/28 plan is now to go to SNF.  SIGNIFICANT EVENTS: 02/05 - MRCP with hepatic mass biopsies 02/15 - Admit 02/16 - basilar artery revascularization in interventional radiology ETT 2/16 >>2/22 2/21: No change in neuro status. Remains off sedation. Weaning on vent 5/5  2/22 - trached 2/28 plan is now for snf ,will need cuffless trach 3/1 order for cuff less trach placed     SUBJECTIVE/OVERNIGHT/INTERVAL HX Follows commands  VITAL SIGNS: BP (!) 150/77   Pulse 92   Temp 98.8 F (37.1 C) (Axillary)   Resp (!) 25   Ht 5\' 2"  (1.575 m)   Wt 155 lb 3.3 oz (70.4 kg)   SpO2 95%   BMI 28.39 kg/m   HEMODYNAMICS:    VENTILATOR SETTINGS: FiO2 (%):  [28 %] 28 %  INTAKE / OUTPUT: I/O last 3 completed shifts: In: 3570 [I.V.:390; NG/GT:2580; IV Piggyback:600] Out: 250 [Urine:250]  PHYSICAL EXAMINATION:  Elderly, no resp distress No jvd Oropharynx clear Neck supple, #6 cuffed trach in place and bloody secretions  Lungs with a few scattered exp  rhonchi  RRR no s3 or or sign murmur Abd soft with nl excursion Extr wam with no edema or clubbing noted Neuro  Follows commands by opening eyes to commands and lip speaks.    LABS: PULMONARY No results for input(s): PHART, PCO2ART, PO2ART, HCO3, TCO2, O2SAT in the  last 168 hours.  Invalid input(s): PCO2, PO2  CBC  Recent Labs Lab 01/21/17 0354 01/22/17 1204 01/23/17 0214  HGB 7.7* 7.6* 7.1*  HCT 24.0* 24.0* 22.6*  WBC 12.1* 9.9 8.6  PLT 228 263 263    COAGULATION No results for input(s): INR in the last 168 hours.  CARDIAC   No results for input(s): TROPONINI in the last 168 hours. No results for input(s): PROBNP in the last 168 hours.   CHEMISTRY  Recent Labs Lab 01/16/17 1412  01/17/17 0413 01/17/17 1641 01/18/17 0329 01/19/17 0305 01/20/17 0304 01/21/17 0354 01/22/17 1204 01/23/17 0214  NA  --   < > 142  --  144 141 139 143 143 142  K  --   < > 3.3*  --  3.3* 4.2 3.9 3.7 3.8 3.4*  CL  --   < > 107  --  105 106 106 106 108 108  CO2  --   < > 29  --  30 28 28 28 27 29   GLUCOSE  --   < > 248*  --  241* 253* 223* 151* 97 90  BUN  --   < > 16  --  15 14 13 12 10 8   CREATININE  --   < > 0.50  --  0.50 0.56 0.64 0.54 0.58 0.54  CALCIUM  --   < > 10.2  --  10.1 9.9 10.2 10.3 10.2 9.9  MG 1.9  --  1.7 2.5* 1.8  --   --   --   --   --   PHOS 3.5  --  3.1 2.7 1.7*  --   --   --   --   --   < > = values in this interval not displayed. Estimated Creatinine Clearance: 56.7 mL/min (by C-G formula based on SCr of 0.54 mg/dL).   LIVER No results for input(s): AST, ALT, ALKPHOS, BILITOT, PROT, ALBUMIN, INR in the last 168 hours.   INFECTIOUS No results for input(s): LATICACIDVEN, PROCALCITON in the last 168 hours.   ENDOCRINE CBG (last 3)   Recent Labs  01/23/17 0057 01/23/17 0306 01/23/17 0734  GLUCAP 90 88 130*         IMAGING x48h  - image(s) personally visualized  -   highlighted in bold No results found.     STUDIES:  MRI/MRA HEAD 2/15: MRI HEAD: Acute extensive multi vascular territory supra and infratentorial infarcts. RIGHT petechial hemorrhage without hemorrhagic conversion. 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 Reconstitution of basilar tip and LEFT  posterior cerebral artery via LEFT posterior communicating artery. CT HEAD W/O 2/16:  Evolving multifocal infarcts with worsening RIGHT cerebellar cytotoxic edema partially effacing the fourth ventricle. No obstructive hydrocephalus. Contrast staining and/or petechial hemorrhage RIGHT parieto-occipital lobe and RIGHT cerebellar infarcts without frank hemorrhagic conversion. BILATERAL LE VENOUS DUPLEX 2/16: Acute DVT involving the peroneal veins of the right lower extremity. No evidence of superficial vein thrombosis of the right lower extremity. No evidence of deep or superficial vein thrombosis of the left lower extremity. PORT CXR 2/18:  Personally reviewed by me. No focal opacity or mass appreciated. No pleural effusion. Elevation of left hemidiaphragm. Endotracheal tube in good position. Enteric feeding tube coursing below diaphragm.  CULTURES: Urine Culture 2/10:  Multiple species present MRSA PCR 2/16:  Negative   ANTIBIOTICS: none  Patient Active Problem List   Diagnosis Date Noted  . Fever   . Tracheostomy status ([place])   . Ventilator dependent ([place])   . Anemia of critical lillness 01/17/2017  . Hypomagnesemia 01/17/2017  . Cerebrovascular accident (CVA) due to thrombosis of precerebral artery ([place])   . Acute on chronic respiratory failure ([place]) s/p Feinstein Tracheostomy 01/16/17   . Cerebral embolism with cerebral infarction 01/10/2017  . Acute encephalopathy with coma 01/09/2017  . Elevated troponin 01/09/2017  . Type 2 diabetes mellitus with hyperglycemia, with long-term current use of insulin ([place]) 01/09/2017  . Hypercalcemia 01/09/2017  . Nonintractable headache   . Metastatic adenocarcinoma to liver ([place]) 12/30/2016  . Hypertension 02/21/2012  . At risk for hyperglycemia 02/21/2012  . Colon cancer ([place]) 02/21/2012     DISCUSSION: 76 y.o.  female with acute respiratory failure unable to protect her airway post basilar artery occlusion and revascularization on 2/16.  Patient also with hepatic metastases consistent with adenocarcinoma on biopsy from 2/5. Patient's mental status and neurologic recovery remains stagnant. 3/1 following some commands.      ASSESSMENT / PLAN:  Acute encephalopathy with coma Follows commands 3/1 Per neuro      Acute on chronic respiratory failure ([place]) s/p Feinstein Tracheostomy 01/16/17 Doing Tcollar since  2/23 as tol, would not resume vent Trach care 2/28 remove sutures from trach. Discuss with MD on changing to cuffless trach. 3/1 Change to #6 cuff less.  Cerebral embolism with cerebral infarction Per neuro  Metastatic adenocarcinoma to liver [facility]) Poor prognosis DNR but s/p trach and peg and she is  to go to snf once cuffless trach placed.   Plan is for SNF, 3/1 will change to 6 cuff less trach. Not #4 due to volume and viscosity of secretions.   Richardson Landry Duey Liller ACNP Maryanna Shape PCCM Pager [phone] till 3 pm If no answer page [phone] 01/23/2017, 8:33 AM

## 2017-01-23 NOTE — Progress Notes (Addendum)
Hypoglycemic Event  CBG: 69  Treatment: D50 IV 25 mL  Symptoms: None  Follow-up CBG: Time: 0020 CBG Result: 131  Possible Reasons for Event: vomiting, inadequate meal intake   Comments/MD notified:    Charmayne Sheer

## 2017-01-23 NOTE — Progress Notes (Addendum)
STROKE TEAM PROGRESS NOTE   SUBJECTIVE (INTERVAL HISTORY) Patient daughter is at bedside. Pt afebrile today and WBC improved. continue Abx. However, developed diarrhea but C diff negative. May related to tube feeding. Also had one episode of vomiting, will hold off TF for now and resume in am. D/c insulin and DM meds. Put on IVF. CCM holding off downsizing trach tomorrow.   OBJECTIVE Temp:  [98.4 F (36.9 C)-99.9 F (37.7 C)] (P) 99.9 F (37.7 C) (03/01 1958) Pulse Rate:  [83-95] (P) 93 (03/01 1958) Cardiac Rhythm: Normal sinus rhythm (03/01 1926) Resp:  [21-31] (P) 22 (03/01 1958) BP: (127-150)/(64-77) 127/64 (03/01 1637) SpO2:  [92 %-99 %] (P) 98 % (03/01 1958) FiO2 (%):  [28 %] 28 % (03/01 1600) Weight:  [155 lb 3.3 oz (70.4 kg)] 155 lb 3.3 oz (70.4 kg) (03/01 0500)  CBC:   Recent Labs Lab 01/22/17 1204 01/23/17 0214  WBC 9.9 8.6  HGB 7.6* 7.1*  HCT 24.0* 22.6*  MCV 87.6 87.3  PLT 263 [id number]    Basic Metabolic Panel:   Recent Labs Lab 01/17/17 1641 01/18/17 0329  01/22/17 1204 01/23/17 0214  NA  --  144  < > 143 142  K  --  3.3*  < > 3.8 3.4*  CL  --  105  < > 108 108  CO2  --  30  < > 27 29  GLUCOSE  --  241*  < > 97 90  BUN  --  15  < > 10 8  CREATININE  --  0.50  < > 0.58 0.54  CALCIUM  --  10.1  < > 10.2 9.9  MG 2.5* 1.8  --   --   --   PHOS 2.7 1.7*  --   --   --   < > = values in this interval not displayed.   IMAGING I have personally reviewed the radiological images below and agree with the radiology interpretations.  Ct Head Wo Contrast 01/09/2017 1. Subacute infarct of the posterosuperior right parietal lobe with expected evolution since the CT of 01/04/2017. No hemorrhage or mass effect. MRI could be obtained for further characterization. 2. Chronic microvascular ischemia.   Mr April Franklin Head Wo Contrast 01/10/2017 Emergent basilar artery and RIGHT posterior cerebral artery occlusion. Reconstitution of basilar tip and LEFT posterior cerebral artery via  LEFT posterior communicating artery.   Mr Brain Wo Contrast 01/10/2017 Acute extensive multi vascular territory supra and infratentorial infarcts. RIGHT petechial hemorrhage without hemorrhagic conversion.   Cerebral angiogram 01/10/2017 S/P bilateral veretbral artery angiograms followed by endovascular complete revascularization of occluded basilar artery X 1pass with trevoprovue 70mm x30 mm retrieval device ,and x 2 passes with solitaire FR 5mm x 40 mm devise achieving a TICI 3 reperfusion. Also used 10.4 mg of IA superselective Integrelin  Ct Head Wo Contrast 01/10/2017 Evolving multifocal infarcts with worsening RIGHT cerebellar cytotoxic edema partially effacing the fourth ventricle. No obstructive hydrocephalus. Contrast staining and/or petechial hemorrhage RIGHT parieto-occipital lobe and RIGHT cerebellar infarcts without frank hemorrhagic conversion.   EEG -  This sedatedEEG is abnormal due to the presence of: 1. Moderatediffuse slowing of thebackground 2. Additional focal slowing over the left centrotemporal region Clinical Correlation of the above findings indicates diffuse cerebral dysfunction that is non-specific in etiology and can be seen with hypoxic/ischemic injury, toxic/metabolic encephalopathies, or medication effect from Propofol. Additional focal slowing over the left centrotemporal region indicates focal cerebral dysfunction in this region suggestive of underlying structural or physiologic  abnormality. There were no electrographic seizures in this study.The absence of epileptiform discharges does not rule out a clinical diagnosis of epilepsy. Clinical correlation is advised.  LE venous Doppler There is evidence of acute deep vein thrombosis involving the peroneal veins of the right lower extremity.  There is no evidence of superficial vein thrombosis involving the right lower extremity. There is no evidence of deep or superficial vein thrombosis involving the  left lower extremity. There is no evidence of a Baker's cyst bilaterally.  Carotid Doppler Findings are consistent with a 1-39 percent stenosis involving the right internal carotid artery and the left internal carotid artery. The vertebral arteries demonstrate antegrade flow.   TEE Left ventricle: The cavity size was normal. Wall thickness was increased in a pattern of mild LVH. Systolic function was normal. The estimated ejection fraction was in the range of 60% to 65%. Wall motion was normal; there were no regional wall motion abnormalities. Features are consistent with a pseudonormal left ventricular filling pattern, with concomitant abnormal relaxation and increased filling pressure (grade 2 diastolic dysfunction). - Aortic valve: There was mild regurgitation. Valve area (VTI): 1.76 cm^2. Valve area (Vmax): 2.02 cm^2. Valve area (Vmean): 1.95 cm^2. - Mitral valve: Valve area by pressure half-time: 2.47 cm^2.  CXR 01/20/17 Central vascular congestion without pulmonary edema. Borderline cardiomegaly. Trace bilateral pleural effusion. Hazy right basilar atelectasis or early infiltrate. Tracheostomy tube in place.    PHYSICAL EXAM  General - cachectic, on trach collar, mild respiratory distress due to secretions  Ophthalmologic - Fundi not visualized.  Cardiovascular - Regular rate and rhythm.  Neuro - on trach collar, barely open eyes to sternal rub, downward gaze deviation, sluggish doll's eyes, not tracking objects. Not following commands. Did not blink to threat, pupil 2.5 mm bilaterally, reactive to light. Corneal reflexes preserved bilaterally and gag cough positive. Left facial droop when gimaces to pain.  Frown bilaterally. Mild withdraw to pain stimulation in all 4 extremities, L>R. DTR diminished, no babinski. Sensation, coordination and gait not tested.   ASSESSMENT/PLAN April Franklin is a 76 y.o. female with history of colon cancer, metastatic  pancreatobiliary adenocarcinoma, HTN, HLD and DM presenting with progressive altered mental state, HA and double vision. She did not receive IV t-PA due to delay in arrival.   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 Infarct embolic likely secondary to hypercoagulable state from metastatic colon cancer.  Resultant not open eyes, not following commands  CT head subacute infarct R parietal lobe, no frank hematoma   MRI  extensive multi vascular supra and infratentorial infarcts. Right petechial hemorrhage.  MRA  basilar artery and R PCA occlusion, I suspect this is chronic changes due to lack of brainstem and PCA ischemia.   Cerebral angiogram TICI 3 revascularization of occluded basilar artery  2D Echo  EF 60-65%   Carotid Doppler unremarkable   LE venous doppler  - right popliteal vein DVT  EEG diffuse slowing with superimposed left centrotemporal slowing   LDL 110  HgbA1c - 7.2  Lovenox 40 mg sq daily  for VTE prophylaxis Diet NPO time specified  No antithrombotic prior to admission, now on lovenox therapeutic dose.   Therapy recommendations:  SNF   Disposition:  Pending  Acute respiratory failure secondary to stroke   Unable to extubated d/t comatose state and brainstem stroke  trach placement 01/16/17  On trach collar with  FiO2 28%  Tolerating well  downsize the trach and change to uncuffed trach tomorrow  Right LE DVT - likely due to hypercoagulable state secondary to metastatic colon cancer  Hold off anticoagulation due to large infarct with petechial hemorrhage and recent procedure with anemia  No IVC filter placed   on lovenox therapeutic dose   Fever - resolved  Mild leukocytosis 10.3->11.0 ->12.1 ->9.9->8.6  UA negative  B Cx NGTD  CXR repeat pending  continue unasyn for total 7 - 10  days  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 s/p liver bx 12/30/16  Likely the source of hypercoagulable state  Dysphagia / vomiting / diarrhea  Tube feeding on hold now  C diff negative  PEG placement 01/17/17 - hold off TF overnight  BUN/Cre stable  Resume in am  Hypertension  SBP goal normotensive  Resume home meds - losartan increase to 50mg  bid  offf Cleviprex  BP stable  Hyperlipidemia  Home meds:  No statin  LDL 110, goal < 70  No statin due to elevated LFTs and liver metastasis  Diabetes  HgbA1c - 7.2  goal < 7.0  CBG much better controlled  On lantus, on hold due to off TF  holding home meds - metformin, glipizide, tradjenta due to NPO status  SSI  Anemia   hemoglobin stable 7.7->7.9->8.0->7.9->7.7->7.6->7.1  Close monitor  If still low, will give 2 Units blood transfusion  Other Stroke Risk Factors  Advanced age  Other Active Problems  Elevated troponins - trending down, likely demand ischemia  Hypokalemia - resolved  Hospital day # 14  The patient is at risk for aspiration, sepsis, septic shock, hemorrhage and recurrent strokes and neurological worsening and she needs ongoing stroke evaluation and aggressive risk factor modification.   Rosalin Hawking, MD PhD Stroke Neurology 01/23/2017 10:23 PM   To contact Stroke Continuity provider, please refer to [email or web address] After hours, contact General Neurology

## 2017-01-24 ENCOUNTER — Inpatient Hospital Stay (HOSPITAL_COMMUNITY): Payer: Medicare Other

## 2017-01-24 DIAGNOSIS — L8990 Pressure ulcer of unspecified site, unspecified stage: Secondary | ICD-10-CM | POA: Insufficient documentation

## 2017-01-24 LAB — BASIC METABOLIC PANEL
Anion gap: 11 (ref 5–15)
BUN: <5 mg/dL — ABNORMAL LOW (ref 6–20)
CHLORIDE: 106 mmol/L (ref 101–111)
CO2: 26 mmol/L (ref 22–32)
CREATININE: 0.53 mg/dL (ref 0.44–1.00)
Calcium: 10.7 mg/dL — ABNORMAL HIGH (ref 8.9–10.3)
GFR calc non Af Amer: >60 mL/min (ref >60)
Glucose, Bld: 132 mg/dL — ABNORMAL HIGH (ref 65–99)
Potassium: 3.5 mmol/L (ref 3.5–5.1)
Sodium: 143 mmol/L (ref 135–145)

## 2017-01-24 LAB — CBC
HCT: 26.3 % — ABNORMAL LOW (ref 36.0–46.0)
HEMOGLOBIN: 8.3 g/dL — AB (ref 12.0–15.0)
MCH: 27.8 pg (ref 26.0–34.0)
MCHC: 31.6 g/dL (ref 30.0–36.0)
MCV: 88 fL (ref 78.0–100.0)
Platelets: 315 10*3/uL (ref 150–400)
RBC: 2.99 MIL/uL — ABNORMAL LOW (ref 3.87–5.11)
RDW: 14.6 % (ref 11.5–15.5)
WBC: 9.8 10*3/uL (ref 4.0–10.5)

## 2017-01-24 LAB — GLUCOSE, CAPILLARY
GLUCOSE-CAPILLARY: 192 mg/dL — AB (ref 65–99)
GLUCOSE-CAPILLARY: 109 mg/dL — AB (ref 65–99)
Glucose-Capillary: 131 mg/dL — ABNORMAL HIGH (ref 65–99)
Glucose-Capillary: 138 mg/dL — ABNORMAL HIGH (ref 65–99)
Glucose-Capillary: 152 mg/dL — ABNORMAL HIGH (ref 65–99)
Glucose-Capillary: 155 mg/dL — ABNORMAL HIGH (ref 65–99)

## 2017-01-24 NOTE — Progress Notes (Signed)
STROKE TEAM PROGRESS NOTE   SUBJECTIVE (INTERVAL HISTORY) Patient daughters are at bedside.  Afebrile, and no leukocytosis. However, did not follow command today. Had diarrhea and vomiting yesterday, tube feeding on hold. C. difficile negative. This morning put off PEG tube, consulted trauma team, and PEG tube was reinserted. CCM changed trach to #6 cuffless. Family discussed with Education officer, museum, required home health to discharge patient home.  OBJECTIVE Temp:  [98.7 F (37.1 C)-99.9 F (37.7 C)] 99 F (37.2 C) (03/02 1600) Pulse Rate:  [86-104] 86 (03/02 1600) Cardiac Rhythm: Normal sinus rhythm (03/02 1600) Resp:  [10-30] 27 (03/02 1600) BP: (120-184)/(60-111) 120/60 (03/02 1600) SpO2:  [92 %-99 %] 98 % (03/02 1600) FiO2 (%):  [28 %] 28 % (03/02 1500) Weight:  [152 lb 5.4 oz (69.1 kg)] 152 lb 5.4 oz (69.1 kg) (03/02 0412)  CBC:   Recent Labs Lab 01/23/17 0214 01/24/17 0254  WBC 8.6 9.8  HGB 7.1* 8.3*  HCT 22.6* 26.3*  MCV 87.3 88.0  PLT 263 [id number]    Basic Metabolic Panel:   Recent Labs Lab 01/18/17 0329  01/23/17 0214 01/24/17 0254  NA 144  < > 142 143  K 3.3*  < > 3.4* 3.5  CL 105  < > 108 106  CO2 30  < > 29 26  GLUCOSE 241*  < > 90 132*  BUN 15  < > 8 <5*  CREATININE 0.50  < > 0.54 0.53  CALCIUM 10.1  < > 9.9 10.7*  MG 1.8  --   --   --   PHOS 1.7*  --   --   --   < > = values in this interval not displayed.   IMAGING I have personally reviewed the radiological images below and agree with the radiology interpretations.  Ct Head Wo Contrast 01/09/2017 1. Subacute infarct of the posterosuperior right parietal lobe with expected evolution since the CT of 01/04/2017. No hemorrhage or mass effect. MRI could be obtained for further characterization. 2. Chronic microvascular ischemia.   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 Reconstitution of basilar tip and LEFT posterior cerebral artery via LEFT  posterior communicating artery.   Mr Brain Wo Contrast 01/10/2017 Acute extensive multi vascular territory supra and infratentorial infarcts. RIGHT petechial hemorrhage without hemorrhagic conversion.   Cerebral angiogram 01/10/2017 S/P bilateral veretbral artery angiograms followed by endovascular complete revascularization of occluded basilar artery X 1pass with trevoprovue 59mm x30 mm retrieval device ,and x 2 passes with solitaire FR 38mm x 40 mm devise achieving a TICI 3 reperfusion. Also used 10.4 mg of IA superselective Integrelin  Ct Head Wo Contrast 01/10/2017 Evolving multifocal infarcts with worsening RIGHT cerebellar cytotoxic edema partially effacing the fourth ventricle. No obstructive hydrocephalus. Contrast staining and/or petechial hemorrhage RIGHT parieto-occipital lobe and RIGHT cerebellar infarcts without frank hemorrhagic conversion.   EEG -  This sedatedEEG is abnormal due to the presence of: 1. Moderatediffuse slowing of thebackground 2. Additional focal slowing over the left centrotemporal region Clinical Correlation of the above findings indicates diffuse cerebral dysfunction that is non-specific in etiology and can be seen with hypoxic/ischemic injury, toxic/metabolic encephalopathies, or medication effect from Propofol. Additional focal slowing over the left centrotemporal region indicates focal cerebral dysfunction in this region suggestive of underlying structural or physiologic abnormality. There were no electrographic seizures in this study.The absence of epileptiform discharges does not rule out a clinical diagnosis of epilepsy. Clinical correlation is advised.  LE  venous Doppler There is evidence of acute deep vein thrombosis involving the peroneal veins of the right lower extremity.  There is no evidence of superficial vein thrombosis involving the right lower extremity. There is no evidence of deep or superficial vein thrombosis involving the left  lower extremity. There is no evidence of a Baker's cyst bilaterally.  Carotid Doppler Findings are consistent with a 1-39 percent stenosis involving the right internal carotid artery and the left internal carotid artery. The vertebral arteries demonstrate antegrade flow.   TEE Left ventricle: The cavity size was normal. Wall thickness was increased in a pattern of mild LVH. Systolic function was normal. The estimated ejection fraction was in the range of 60% to 65%. Wall motion was normal; there were no regional wall motion abnormalities. Features are consistent with a pseudonormal left ventricular filling pattern, with concomitant abnormal relaxation and increased filling pressure (grade 2 diastolic dysfunction). - Aortic valve: There was mild regurgitation. Valve area (VTI): 1.76 cm^2. Valve area (Vmax): 2.02 cm^2. Valve area (Vmean): 1.95 cm^2. - Mitral valve: Valve area by pressure half-time: 2.47 cm^2.  CXR 01/20/17 Central vascular congestion without pulmonary edema. Borderline cardiomegaly. Trace bilateral pleural effusion. Hazy right basilar atelectasis or early infiltrate. Tracheostomy tube in place.   CXR 01/24/17 Tracheostomy catheter as described. No pneumothorax. Partial clearing right base patchy airspace opacity. Atelectatic change remains in the right mid lung as well as in the left base. No new opacity. Stable cardiac prominence. Aortic atherosclerosis present.   PHYSICAL EXAM  General - cachectic, on trach collar, mild respiratory distress due to secretions  Ophthalmologic - Fundi not visualized.  Cardiovascular - Regular rate and rhythm.  Neuro - on trach collar, open eyes to voice, left gaze deviation, sluggish doll's eyes, not tracking objects. Not following commands. Did not blink to threat, pupil 2.5 mm bilaterally, reactive to light. Left facial droop when gimaces to pain.  Frown bilaterally. Withdraw to pain stimulation in all 4 extremities,  L>R. DTR diminished, no babinski. Sensation, coordination and gait not tested.   ASSESSMENT/PLAN [name] is a 76 y.o. female with history of colon cancer, metastatic pancreatobiliary adenocarcinoma, HTN, HLD and DM presenting with progressive altered mental state, HA and double vision. She did not receive IV t-PA due to delay in arrival.   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 Infarct embolic likely secondary to hypercoagulable state from metastatic colon cancer.  Resultant not open eyes, not following commands  CT head subacute infarct R parietal lobe, no frank hematoma   MRI  extensive multi vascular supra and infratentorial infarcts. Right petechial hemorrhage.  MRA  basilar artery and R PCA occlusion, I suspect this is chronic changes due to lack of brainstem and PCA ischemia.   Cerebral angiogram TICI 3 revascularization of occluded basilar artery  2D Echo  EF 60-65%   Carotid Doppler unremarkable   LE venous doppler  - right popliteal vein DVT  EEG diffuse slowing with superimposed left centrotemporal slowing   LDL 110  HgbA1c - 7.2  Lovenox 40 mg sq daily  for VTE prophylaxis Diet NPO time specified  No antithrombotic prior to admission, now on lovenox therapeutic dose.   Therapy recommendations:  SNF   Disposition:  Pending  Acute respiratory failure secondary to stroke   Unable to extubated d/t comatose state and brainstem stroke  trach placement 01/16/17  On trach collar  with FiO2 28%  Tolerating well  trach changed to #6 cuffless - ready for discharge  Right LE DVT - likely due to hypercoagulable state secondary to metastatic colon cancer  Hold off anticoagulation due to large infarct with petechial hemorrhage and recent procedure with anemia  No IVC filter placed   on lovenox  therapeutic dose   Fever - resolved  Mild leukocytosis 10.3->11.0 ->12.1 ->9.9->8.6  UA negative  B Cx NGTD  CXR repeat RLL patchy opacity improved  continue unasyn for total 7 - 10 days (day #5 today)  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 s/p liver bx 12/30/16  Likely the source of hypercoagulable state  Dysphagia / vomiting / diarrhea  PEG placement 01/17/17   PEG tube pulled off today - trauma team called back - PEG reinserted  Tube feeding resumed after PEG reinsertion  C diff negative  Goal rate @ 60cc/h   On free water 40cc/h  BUN/Cre stable  Hypertension  SBP goal normotensive  Resume home meds - losartan increase to 50mg  bid  BP stable  Hyperlipidemia  Home meds:  No statin  LDL 110, goal < 70  No statin due to elevated LFTs and liver metastasis  Diabetes  HgbA1c - 7.2  goal < 7.0  SSI  CBG monitoring  Consider resume lantus once TF starts and based on CBG  home meds - metformin, glipizide, tradjenta - resume once TF starts and based on CBG monitoring  Anemia   hemoglobin stable 7.7->7.9->8.0->7.9->7.7->7.6->7.1 -> 8.3  Close monitor  Other Stroke Risk Factors  Advanced age  Other Active Problems  Elevated troponins - trending down, likely demand ischemia  Hypokalemia - resolved  Hospital day # 15   Rosalin Hawking, MD PhD Stroke Neurology 01/24/2017 7:15 PM   To contact Stroke Continuity provider, please refer to [email or web address] After hours, contact General Neurology

## 2017-01-24 NOTE — Care Management Note (Addendum)
Case Management Note  Patient Details  Name: April Franklin MRN: [id number] Date of Birth: 1941-10-21  Subjective/Objective:      S/p liver bx of hepatic masses with MRCP on 2/5, has hx of dm, colon cancer post resection, htn, and hld, was found to have a subacute infarct. She had revascularization in IR on 2/16.  Off vent, now on trach collar 28 %, has metastatic adenocarcinoma to liver, acute encephalopathy.  Per previous NCM note, Family DOES NOT WANT HOSPICE services for patient, they prefer to go home with [place] with [facility].  NCM spoke with daughter , April Franklin [id number] at bedside, she states she is interested in patient going to [facility] if possible, also she would like to find out who to give the medicaid application to and she would like a private duty list.  NCM will give her this information tomorrow.  Informed her that patient would have to meet criteria to go to [facility].  Patient conts on tube feeds per peg, and trach collar.  NCM will cont to follow for dc needs.              2/27 [place], BSN- NCM spoke withj April Franklin ,patient 's daughter in the room at bedside,  Informed her that patient is more of SNF candidatie then LTAC candidate ( this is per Market researcher)  NCM informed CSW that daughter is interested in SNF and would like to speak with her.  NCM gave the [place] list to [place] from [place].  CSW states she will contact [place] today.  April Franklin will take the medicaid application to DSS today.  NCM also gave her a private duty list.  NCM will cont to follow for dc needs.   3/1 [place], BSN- patient is for a cuffless trach  #6 today, will not downsize to 4 today due to volume and viscosity of secretions. , plan is for SNF when stable.  3/2 [place], BSN - NCM notified by CSW that daughter wanted to speak to [facility] about options for [facility] services.  NCM spoke to [place], she stated she would prefer [facility] if they decide they  will not be able to do the 24 hr at home, she will check with her family tonight and let the NCM know in the morning on the weekend.  NCM informed Butch Penny with [facility] and [place],  She will need trach supplies and tube feeing supplies .  April Franklin has two sisters , one that is a Therapist, sports and one that is a LPN, She states if they can not do the 24 hr care then it would have to be SNF.  NCM wil go ahead and get everthing ready for [facility] services in case she chooses this route.   NCM need MD to sign off on orders for home tube feeds, home trach supplies, and home hospital bed, and respiratory consult for trach teaching for family.                  Action/Plan:   Expected Discharge Date:   (unknown)               Expected Discharge Plan:  [place]  In-House Referral:  Clinical Social Work  Discharge planning Services  CM Consult  Post Acute Care Choice:    Choice offered to:     DME Arranged:    DME Agency:     HH Arranged:  [place] Agency:     Status of Service:  In process, will continue to follow  If discussed at Long Length of Stay Meetings, dates discussed:    Additional Comments:  Zenon Mayo, RN 01/24/2017, 2:33 PM

## 2017-01-24 NOTE — Care Management Note (Addendum)
Case Management Note  Patient Details  Name: KARLITA MCMURDO MRN: [id number] Date of Birth: 08/17/1941  Subjective/Objective:    S/p liver bx of hepatic masses with MRCP on 2/5, has hx of dm, colon cancer post resection, htn, and hld, was found to have a subacute infarct. She had revascularization in IR on 2/16.  Off vent, now on trach collar 28 %, has metastatic adenocarcinoma to liver, acute encephalopathy.  Per previous NCM note, Family DOES NOT WANT HOSPICE services for patient, they prefer to go home with [place] with [facility].  NCM spoke with daughter , Linwood Dibbles [id number] at bedside, she states she is interested in [facility] going to [facility] if possible, also she would like to find out who to give the medicaid application to and she would like a private duty list.  NCM will give her this information tomorrow.  Informed her that patient would have to meet criteria to go to [facility].  Patient conts on tube feeds per peg, and trach collar.  NCM will cont to follow for dc needs.              2/27 [place], BSN- NCM spoke withj Linwood Dibbles ,patient 's daughter in the room at bedside,  Informed her that patient is more of SNF candidatie then LTAC candidate ( this is per Market researcher)  NCM informed CSW that daughter is interested in SNF and would like to speak with her.  NCM gave the [place] list to Lowrey from [place].  CSW states she will contact [place] today.  Linwood Dibbles will take the medicaid application to DSS today.  NCM also gave her a private duty list.  NCM will cont to follow for dc needs.   3/1 [place], BSN- patient is for a cuffless trach  #6 today, will not downsize to 4 today due to volume and viscosity of secretions. , plan is for SNF when stable.  3/2 [place], BSN - NCM notified by CSW that daughter wanted to speak to [facility] about options for [facility] services.  NCM spoke to [place], she stated she would prefer [facility] if they decide they  will not be able to do the 24 hr at home, she will check with her family tonight and let the NCM know in the morning on the weekend.  NCM informed Butch Penny with [place] and [place],  She will need trach supplies and tube feeing supplies .  Linwood Dibbles has two sisters , one that is a Therapist, sports and one that is a LPN, She states if they can not do the 24 hr care then it would have to be SNF.  NCM wil go ahead and get everthing ready for [facility] services in case she chooses this route.   NCM need MD to sign off on orders for home tube feeds, home trach supplies, and home hospital bed, and respiratory consult for trach teaching for family.     3/6 Tomi Bamberger RN, BSN - family has decided to take patient home since they did not like the facilities that was offered by CSW.  They chose [facility], referral was made to [facility] for [facility], PT, [place], aide, [place], and Education officer, museum.  Also for trach supplies, hospital bed (with hoyer lift, gel overlay)  w/chair, 3 n 1, tube feeding supplies,  These all have been delivered to patient's home on 3/6.  Family is receiving trach education from respiratory.  The tube feeding teaching will be  done when patient goes home.  NCM will cont to follow for dc needs.                 Action/Plan:  3/7 1157 Tomi Bamberger RN, - plan is for dc on 3/8, respiratory has been doing family teaching with the trach, the resp therapist  Has patient on room air now.  NCM informed [facility] that plan is for dc tomorrow.  She will go home by [facility], address confirmed.  [address] [place] [address]. RN was informed that patient need 2 trachs to go home with her , one size 6 and one size 4.  N[facility] checked [facility] on [address] and they do have lovenox 60mg  in stock.   Benefit check done and the generic is covered- co pay is $5.90, Also NCM spoke with daughter Linwood Dibbles again today and asked if she felt comfortable taking mother home , she states yes,  NCM infomred her that they need to be helping RN with care while she is here in  hospital so they will know what to do when they get home.  Linwood Dibbles was concerned that the peg tube was leaking , MD will address.  NCM will cont to follow for dc needs.    Expected Discharge Date:   (unknown)               Expected Discharge Plan:  [place]  In-House Referral:  Clinical Social Work  Discharge planning Services  CM Consult  Post Acute Care Choice:  Home Health Choice offered to:  Adult Children  DME Arranged:  Gel overlay, Hospital bed, Suction, Tube feeding, Trach supplies DME Agency:  [place]:  RN, PT, OT, Nurse's Aide, Social Work, [place] Agency:  [place]  Status of Service:  In process, will continue to follow  If discussed at Long Length of Stay Meetings, dates discussed:    Additional Comments:  Zenon Mayo, RN 01/24/2017, 4:06 PM

## 2017-01-24 NOTE — Clinical Social Work Note (Signed)
Patient's daughter called CSW yesterday afternoon and stated that she was unhappy with her bed offers. Her preferences were [facility], [place], [facility], and [facility]. CSW explained that the referral was sent only to facilities that manage trachs but CSW would reach out to them. All of these facilities have declined because they do not manage trach care. Patient's daughter notified and stated that she wants more information on home health so she can weigh her options. RNCM notified.  Dayton Scrape, [place]

## 2017-01-24 NOTE — Progress Notes (Signed)
Nutrition Follow-up  DOCUMENTATION CODES:   Severe malnutrition in context of acute illness/injury  INTERVENTION:    When able, resume Glucerna 1.2 at 30 ml/h via PEG, increase by 10 ml every 4 hours to goal rate of 60 ml/h   Provides 1728 kcal, 86 gm protein, 1159 ml free water daily  NUTRITION DIAGNOSIS:   Malnutrition (Severe) related to acute illness as evidenced by energy intake < or equal to 50% for > or equal to 5 days, severe depletion of muscle mass, percent weight loss. Ongoing.   GOAL:   Patient will meet greater than or equal to 90% of their needs Progressing.   MONITOR:   TF tolerance, Labs, I & O's  ASSESSMENT:   Pt with PMH of DM, HTN, colon cancer s/p resection 2009. 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 She underwent IR intervention with revascularization and is now in ICU on vent.  S/P trach and PEG 2/22. Remains on trach collar. TF off since 3/1 due to an episode of vomiting.  Patient pulled PEG tube out this morning. Surgery Team replaced with a 22F foley and it is now ready to use. CBG's [id number]  Diet Order:  Diet NPO time specified  Skin:  Wound (see comment) (stage 1 to buttocks)  Last BM:  3/1  Height:   Ht Readings from Last 1 Encounters:  01/10/17 5\' 2"  (1.575 m)    Weight:   Wt Readings from Last 1 Encounters:  01/24/17 152 lb 5.4 oz (69.1 kg)    Ideal Body Weight:  50 kg  BMI:  Body mass index is 27.86 kg/m.  Estimated Nutritional Needs:   Kcal:  1500-1700  Protein:  85-100 gm  Fluid:  1.5-1.7 L  EDUCATION NEEDS:   No education needs identified at this time  Molli Barrows, [place], [place], [place] Pager [phone] After Hours Pager [phone]

## 2017-01-24 NOTE — Progress Notes (Signed)
Pt trach changed to #6 cuffless.  Placement confirmed via ETCO2 detector.  Pt tolerated procedure well with no signs of distress.

## 2017-01-24 NOTE — Progress Notes (Signed)
PT CBG trending down 96 then at 2000 CBG was 72. MD paged. MD ordered fluids D5NS @ 50 ml/hr and hold Lantus for tonight. Will continue to monitor.

## 2017-01-24 NOTE — Progress Notes (Signed)
Patients peg tube got pulled out while turning and cleaning patient. Dressing applied and MD made aware.

## 2017-01-24 NOTE — Progress Notes (Signed)
PULMONARY / CRITICAL CARE MEDICINE   Name: April Franklin MRN: [id number] DOB: 1941/10/08    ADMISSION DATE:  01/09/2017 CONSULTATION DATE:  01/09/2017  REFERRING MD:  Dr. [name]   CHIEF COMPLAINT:  Basilar Artery Occlusion   brief  76 y.o. female with known history of diabetes mellitus, colon cancer postresection, hypertension, and hyperlipidemia. Status post liver biopsy of hepatic masses with MRCP on 12/30/16.   On 2/15 was found to have a subacute infarct. MRI demonstrated extensive areas of infarct emerging from the basilar artery and right posterior communicating artery with occlusion. Patient underwent revascularization in interventional radiology on 2/16   2/28 plan is now to go to SNF.  SIGNIFICANT EVENTS: 02/05 - MRCP with hepatic mass biopsies 02/15 - Admit 02/16 - basilar artery revascularization in interventional radiology ETT 2/16 >>2/22 2/21: No change in neuro status. Remains off sedation. Weaning on vent 5/5  2/22 - trached 2/28 plan is now for snf ,will need cuffless trach 3/1  order for cuff less trach placed, but due to vomiting, deferred x 24 hours.  3/2: Will ask RT to downsize to #4 Cuff less if no further vomiting.     SUBJECTIVE/OVERNIGHT/INTERVAL HX Eyes open, Following simple commands, more alert per family  VITAL SIGNS: BP (!) 169/111 (BP Location: Left Arm)   Pulse (!) 102   Temp 98.9 F (37.2 C) (Axillary)   Resp (!) 21   Ht 5\' 2"  (1.575 m)   Wt 152 lb 5.4 oz (69.1 kg)   SpO2 96%   BMI 27.86 kg/m   HEMODYNAMICS:    VENTILATOR SETTINGS: FiO2 (%):  [28 %] 28 %  INTAKE / OUTPUT: I/O last 3 completed shifts: In: 3135 [I.V.:1011; Other:60; [id number]; IV [id number] Out: 400 [Emesis/NG output:400]  PHYSICAL EXAMINATION:  Physical Exam:  General- No distress,  Awake and nods to simple questions ENT: No sinus tenderness, TM clear, pale nasal mucosa, no oral exudate,no post nasal drip, no LAN, trach intact and in place. Minimal  secretions per RT Cardiac: S1, S2, regular rate and rhythm, no murmur Chest: No wheeze/ rales/ dullness; no accessory muscle use, no nasal flaring, no sternal retractions, few scattered rhonchi Abd.: Soft Non-tender, flat Ext: No clubbing cyanosis, edema Neuro:  Deconditioned, left facial droop, PERRLA, WD to pain x 4 extremities Skin: No rashes, warm and dry Psych:  .    LABS: PULMONARY No results for input(s): PHART, PCO2ART, PO2ART, HCO3, TCO2, O2SAT in the last 168 hours.  Invalid input(s): PCO2, PO2  CBC  Recent Labs Lab 01/22/17 1204 01/23/17 0214 01/24/17 0254  HGB 7.6* 7.1* 8.3*  HCT 24.0* 22.6* 26.3*  WBC 9.9 8.6 9.8  PLT 263 263 315    COAGULATION No results for input(s): INR in the last 168 hours.  CARDIAC   No results for input(s): TROPONINI in the last 168 hours. No results for input(s): PROBNP in the last 168 hours.   CHEMISTRY  Recent Labs Lab 01/17/17 1641 01/18/17 0329  01/20/17 0304 01/21/17 0354 01/22/17 1204 01/23/17 0214 01/24/17 0254  NA  --  144  < > 139 143 143 142 143  K  --  3.3*  < > 3.9 3.7 3.8 3.4* 3.5  CL  --  105  < > 106 106 108 108 106  CO2  --  30  < > 28 28 27 29 26   GLUCOSE  --  241*  < > 223* 151* 97 90 132*  BUN  --  15  < >  13 12 10 8  <5*  CREATININE  --  0.50  < > 0.64 0.54 0.58 0.54 0.53  CALCIUM  --  10.1  < > 10.2 10.3 10.2 9.9 10.7*  MG 2.5* 1.8  --   --   --   --   --   --   PHOS 2.7 1.7*  --   --   --   --   --   --   < > = values in this interval not displayed. Estimated Creatinine Clearance: 56.2 mL/min (by C-G formula based on SCr of 0.53 mg/dL).   LIVER No results for input(s): AST, ALT, ALKPHOS, BILITOT, PROT, ALBUMIN, INR in the last 168 hours.   INFECTIOUS No results for input(s): LATICACIDVEN, PROCALCITON in the last 168 hours.   ENDOCRINE CBG (last 3)   Recent Labs  01/24/17 0014 01/24/17 0416 01/24/17 0749  GLUCAP 131* 138* 155*         IMAGING x48h  - image(s) personally  visualized  -   highlighted in bold Dg Chest Port 1 View  Result Date: 01/24/2017 CLINICAL DATA:  Respiratory distress EXAM: PORTABLE CHEST 1 VIEW COMPARISON:  January 20, 2017 FINDINGS: Tracheostomy catheter tip is 2.7 cm above the carina. No pneumothorax. There has been partial clearing of airspace consolidation from the right base. Atelectatic appearing opacity is noted in the right mid lung. There is also mild atelectasis in the left base. No new opacity evident. Heart is mildly enlarged with pulmonary vascularity within normal limits. No adenopathy. There is atherosclerotic calcification in the aorta. No bone lesions peer IMPRESSION: Tracheostomy catheter as described. No pneumothorax. Partial clearing right base patchy airspace opacity. Atelectatic change remains in the right mid lung as well as in the left base. No new opacity. Stable cardiac prominence. Aortic atherosclerosis present. Electronically Signed   By: Lowella Grip III M.D.   On: 01/24/2017 07:29       STUDIES:  MRI/MRA HEAD 2/15: MRI HEAD: Acute extensive multi vascular territory supra and infratentorial infarcts. RIGHT petechial hemorrhage without hemorrhagic conversion. 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 Reconstitution of basilar tip and LEFT posterior cerebral artery via LEFT posterior communicating artery. CT HEAD W/O 2/16:  Evolving multifocal infarcts with worsening RIGHT cerebellar cytotoxic edema partially effacing the fourth ventricle. No obstructive hydrocephalus. Contrast staining and/or petechial hemorrhage RIGHT parieto-occipital lobe and RIGHT cerebellar infarcts without frank hemorrhagic conversion. BILATERAL LE VENOUS DUPLEX 2/16: Acute DVT involving the peroneal veins of the right lower extremity. No evidence of superficial vein thrombosis of the right lower extremity. No evidence of deep or superficial vein thrombosis of the left lower extremity. PORT CXR 2/18:   Personally reviewed by me. No focal opacity or mass appreciated. No pleural effusion. Elevation of left hemidiaphragm. Endotracheal tube in good position. Enteric feeding tube coursing below diaphragm.  CULTURES: Urine Culture 2/10:  Multiple species present MRSA PCR 2/16:  Negative   ANTIBIOTICS: Unasyn 01/20/2017>>  Patient Active Problem List   Diagnosis Date Noted  . Aspiration pneumonia ([place])   . Dizziness   . Fever   . Tracheostomy status ([place])   . Ventilator dependent ([place])   . Anemia of critical lillness 01/17/2017  . Hypomagnesemia 01/17/2017  . Cerebrovascular accident (CVA) due to thrombosis of precerebral artery ([facility])   . Acute on chronic respiratory failure ([place]) s/p Feinstein Tracheostomy 01/16/17   . Cerebral embolism with cerebral infarction 01/10/2017  . Acute encephalopathy with coma 01/09/2017  . Elevated  troponin 01/09/2017  . Type 2 diabetes mellitus with hyperglycemia, with long-term current use of insulin ([place]) 01/09/2017  . Hypercalcemia 01/09/2017  . Nonintractable headache   . Metastatic adenocarcinoma to liver ([place]) 12/30/2016  . Hypertension 02/21/2012  . At risk for hyperglycemia 02/21/2012  . Colon cancer ([place]) 02/21/2012     DISCUSSION: 76 y.o.  female with acute respiratory failure unable to protect her airway post basilar artery occlusion and revascularization on 2/16. Patient also with hepatic metastases consistent with adenocarcinoma on biopsy from 2/5. Patient's mental status and neurologic recovery remains stagnant.2/22 Trached, 3/1 following some commands. 3/2>> no further nausea or vomiting. Will downsize to 4 cuff less trach     ASSESSMENT / PLAN:  Acute encephalopathy with coma Follows commands 3/2 Per neuro   Acute on chronic respiratory failure ([place]) s/p Feinstein Tracheostomy 01/16/17 CXR  01/24/2017:  IMPRESSION: Partial clearing right base patchy airspace opacity. Atelectatic change remains in the right mid lung as well as in the  left base. No new opacity.  Doing well on  Tcollar since  2/23 , would not resume vent Trach care per routine 2/28 remove sutures from trach. Discuss with MD on changing to cuffless trach. 3/2 Change to #6 cuff less per Dr. Bari Mantis note 3/1  Cerebral embolism with cerebral infarction Per neuro  Metastatic adenocarcinoma to liver [facility]) Poor prognosis DNR but s/p trach and peg and she is to go to snf once cuffless trach placed.   Plan is for SNF, 3/2 will change to 4 cuff less trach per [place]'s note 3/1. Per RT secretions are less thick and tenacious today.   Magdalen Spatz, AGACNP-BC [place] If no answer page [phone] 01/24/2017, 11:02 AM

## 2017-01-24 NOTE — Progress Notes (Signed)
Considerable leakage from around G-tube Foley.  Tube feeding held.

## 2017-01-24 NOTE — Progress Notes (Signed)
[facility] Progress Note  8 Days Post-Op  Subjective: Trauma team received a call today because patient pulled out her PEG tube this AM. Patient laying in bed, no acute distress, family at bedside.  Objective: Vital signs in last 24 hours: Temp:  [98.7 F (37.1 C)-99.9 F (37.7 C)] 98.9 F (37.2 C) (03/02 0750) Pulse Rate:  [83-102] 102 (03/02 0750) Resp:  [10-31] 21 (03/02 0750) BP: (127-184)/(64-111) 169/111 (03/02 0750) SpO2:  [92 %-99 %] 96 % (03/02 0750) FiO2 (%):  [28 %] 28 % (03/02 0455) Weight:  [69.1 kg (152 lb 5.4 oz)] 69.1 kg (152 lb 5.4 oz) (03/02 0412) Last BM Date: 01/23/17  Intake/Output from previous day: 03/01 0701 - 03/02 0700 In: 2202 [I.V.:892; NG/GT:750; IV Piggyback:500] Out: 400 [Emesis/NG output:400] Intake/Output this shift: No intake/output data recorded.  PE: Gen:  Alert, NAD Pulm:  Non-labored Abd: Soft, obese, non-tender, non-distended - 12F foley placed into previous PEG site without resistance - irrigated and suctioned back gastric contents, indicating proper position. Patient tolerated this well.   Lab Results:   Recent Labs  01/23/17 0214 01/24/17 0254  WBC 8.6 9.8  HGB 7.1* 8.3*  HCT 22.6* 26.3*  PLT 263 315   BMET  Recent Labs  01/23/17 0214 01/24/17 0254  NA 142 143  K 3.4* 3.5  CL 108 106  CO2 29 26  GLUCOSE 90 132*  BUN 8 <5*  CREATININE 0.54 0.53  CALCIUM 9.9 10.7*   PT/INR No results for input(s): LABPROT, INR in the last 72 hours. CMP     Component Value Date/Time   NA 143 01/24/2017 0254   NA 138 01/27/2015 0941   K 3.5 01/24/2017 0254   K 4.1 01/27/2015 0941   CL 106 01/24/2017 0254   CL 103 12/07/2012 0928   CO2 26 01/24/2017 0254   CO2 26 01/27/2015 0941   GLUCOSE 132 (H) 01/24/2017 0254   GLUCOSE 388 (H) 01/27/2015 0941   GLUCOSE 286 (H) 12/07/2012 0928   BUN <5 (L) 01/24/2017 0254   BUN 11.5 01/27/2015 0941   CREATININE 0.53 01/24/2017 0254   CREATININE 0.9 01/27/2015 0941   CALCIUM 10.7 (H) 01/24/2017 0254   CALCIUM 10.8 (H) 01/27/2015 0941   PROT 6.5 01/10/2017 0104   PROT 6.7 01/27/2015 0941   ALBUMIN 2.9 (L) 01/12/2017 0244   ALBUMIN 3.7 01/27/2015 0941   AST 79 (H) 01/10/2017 0104   AST 11 01/27/2015 0941   ALT 117 (H) 01/10/2017 0104   ALT 11 01/27/2015 0941   ALKPHOS 341 (H) 01/10/2017 0104   ALKPHOS 119 01/27/2015 0941   BILITOT 1.1 01/10/2017 0104   BILITOT 0.74 01/27/2015 0941   GFRNONAA >60 01/24/2017 0254   GFRAA >60 01/24/2017 0254   Lipase  No results found for: LIPASE     Studies/Results: Dg Chest Port 1 View  Result Date: 01/24/2017 CLINICAL DATA:  Respiratory distress EXAM: PORTABLE CHEST 1 VIEW COMPARISON:  January 20, 2017 FINDINGS: Tracheostomy catheter tip is 2.7 cm above the carina. No pneumothorax. There has been partial clearing of airspace consolidation from the right base. Atelectatic appearing opacity is noted in the right mid lung. There is also mild atelectasis in the left base. No new opacity evident. Heart is mildly enlarged with pulmonary vascularity within normal limits. No adenopathy. There is atherosclerotic calcification in the aorta. No bone lesions peer IMPRESSION: Tracheostomy catheter as described. No pneumothorax. Partial clearing right base patchy airspace opacity. Atelectatic change remains in the right mid lung  as well as in the left base. No new opacity. Stable cardiac prominence. Aortic atherosclerosis present. Electronically Signed   By: Lowella Grip III M.D.   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 - call us as needed, patient may resume meds/feeds per tube when clinically indicated by primary team.     LOS: 15 days    Jill Alexanders , [facility] 01/24/2017, 11:22 AM Pager: [phone] Consults: [phone] Mon-Fri 7:00 am-4:30 pm Sat-Sun 7:00 am-11:30 am

## 2017-01-25 ENCOUNTER — Inpatient Hospital Stay (HOSPITAL_COMMUNITY): Payer: Medicare Other

## 2017-01-25 LAB — CBC
HCT: 23.5 % — ABNORMAL LOW (ref 36.0–46.0)
Hemoglobin: 7.3 g/dL — ABNORMAL LOW (ref 12.0–15.0)
MCH: 27.8 pg (ref 26.0–34.0)
MCHC: 31.1 g/dL (ref 30.0–36.0)
MCV: 89.4 fL (ref 78.0–100.0)
PLATELETS: 317 10*3/uL (ref 150–400)
RBC: 2.63 MIL/uL — AB (ref 3.87–5.11)
RDW: 15.1 % (ref 11.5–15.5)
WBC: 9.5 10*3/uL (ref 4.0–10.5)

## 2017-01-25 LAB — CULTURE, BLOOD (ROUTINE X 2)
CULTURE: NO GROWTH
Culture: NO GROWTH

## 2017-01-25 LAB — BASIC METABOLIC PANEL
ANION GAP: 9 (ref 5–15)
BUN: 5 mg/dL — ABNORMAL LOW (ref 6–20)
CO2: 27 mmol/L (ref 22–32)
Calcium: 10.3 mg/dL (ref 8.9–10.3)
Chloride: 109 mmol/L (ref 101–111)
Creatinine, Ser: 0.63 mg/dL (ref 0.44–1.00)
Glucose, Bld: 123 mg/dL — ABNORMAL HIGH (ref 65–99)
POTASSIUM: 3.4 mmol/L — AB (ref 3.5–5.1)
SODIUM: 145 mmol/L (ref 135–145)

## 2017-01-25 LAB — GLUCOSE, CAPILLARY
GLUCOSE-CAPILLARY: 122 mg/dL — AB (ref 65–99)
GLUCOSE-CAPILLARY: 124 mg/dL — AB (ref 65–99)
GLUCOSE-CAPILLARY: 138 mg/dL — AB (ref 65–99)
GLUCOSE-CAPILLARY: 141 mg/dL — AB (ref 65–99)
GLUCOSE-CAPILLARY: 153 mg/dL — AB (ref 65–99)
Glucose-Capillary: 147 mg/dL — ABNORMAL HIGH (ref 65–99)
Glucose-Capillary: 112 mg/dL — ABNORMAL HIGH (ref 65–99)

## 2017-01-25 MED ORDER — SODIUM CHLORIDE 0.9 % IV SOLN
INTRAVENOUS | Status: DC
  Administered 2017-01-25 – 2017-01-27 (×2): via INTRAVENOUS

## 2017-01-25 MED ORDER — POTASSIUM CHLORIDE 20 MEQ PO PACK
20.0000 meq | PACK | Freq: Two times a day (BID) | ORAL | Status: AC
  Filled 2017-01-25 (×4): qty 1

## 2017-01-25 MED ORDER — WHITE PETROLATUM GEL
Status: AC
  Administered 2017-01-25: 21:00:00
  Filled 2017-01-25: qty 1

## 2017-01-25 NOTE — Progress Notes (Signed)
Informed Neurology PA Rinehuls that patients peg tube is leaking around the site and that I have attempted to page the [facility] PA twice with no response. Per PA continue to try and reach Trama and place dressing over tube.

## 2017-01-25 NOTE — Progress Notes (Signed)
PT Cancellation Note  Patient Details Name: April Franklin MRN: [id number] DOB: [birth year]   Cancelled Treatment:    Reason Eval/Treat Not Completed: Medical issues which prohibited therapy;Patient at procedure or test/unavailable. Per pt's RN, pt is having a lot of issues with heavy leakage from g-tube. Additionally, pt's RN stating that she will be going for a CT of the abdomen at some point today. PT will continue to f/u with pt as available and appropriate.   [place] 01/25/2017, 11:23 AM

## 2017-01-25 NOTE — Progress Notes (Signed)
Informed Dr. Leonel Ramsay that the trama team wants patients peg tube to gravity over night. Per MD start NS at 50ml/hour.

## 2017-01-25 NOTE — Progress Notes (Signed)
Informed Dr. Donne Hazel that patients peg tube is leaking around the site and the neurologist placed orders for a Stat CT of the ABD. Per MD place peg to foley drainage.

## 2017-01-25 NOTE — Progress Notes (Signed)
Attempted to restart tube feedings, but due to leakage had to stop tube feedings again.  Pt still leaking around G-tube w/o TFs.

## 2017-01-25 NOTE — Progress Notes (Signed)
Had peg tube 2/22 then yesterday pulled tube 9 days after placement.   A foley was placed yesterday and gastric contents aspirated. Since placement the tube has had leakage around it and has not been used. I put the tube to drainage and does have some contents present.  Will get ct scan that has already been ordered. I am concerned tube was replaced without imaging given early postop period from peg and needs to be further evaluated. Her abdomen is not tender except at peg site

## 2017-01-25 NOTE — Progress Notes (Signed)
STROKE TEAM PROGRESS NOTE    HPI April Franklin is a 76 y.o. female with a history of colon cancer who has been having progressive altered mental status and confusion over the past several weeks. It was only mild until 2 days ago at which point it started getting progressively worse, then this morning when she was checked on, she did not arouse appropriately and therefore she was brought into the emergency department.  She also has been complaining of headaches over the past few weeks as well.  On MRI, she has a area of hypodensity in the right parietal region consistent with a subacute infarct.  She also has been complaining of double vision.  LKW: At least several days ago tpa given?: no, outside of window  IR -> Cerebral angiogram 01/10/2017 S/P bilateral veretbral artery angiograms followed by endovascular complete revascularization of occluded basilar artery X 1pass with trevoprovue 36mm x30 mm retrieval device ,and x 2 passes with solitaire FR 2mm x 40 mm devise achieving a TICI 3 reperfusion. Also used 10.4 mg of IA superselective Integrelin    SUBJECTIVE (INTERVAL HISTORY)  PEG tube has been leaking around the insertion site Patient daughters are at bedside were updated, TF was held, Trauma surgery was called, CT abdomen was ordered   OBJECTIVE Temp:  [98.1 F (36.7 C)-99.8 F (37.7 C)] 98.1 F (36.7 C) (03/03 0755) Pulse Rate:  [72-103] 82 (03/03 1134) Cardiac Rhythm: Normal sinus rhythm (03/03 0800) Resp:  [14-27] 25 (03/03 1134) BP: (116-177)/(58-91) 132/59 (03/03 1134) SpO2:  [94 %-99 %] 98 % (03/03 1134) FiO2 (%):  [28 %] 28 % (03/03 1134) Weight:  [69 kg (152 lb 1.9 oz)] 69 kg (152 lb 1.9 oz) (03/03 0500)  CBC:   Recent Labs Lab 01/24/17 0254 01/25/17 0328  WBC 9.8 9.5  HGB 8.3* 7.3*  HCT 26.3* 23.5*  MCV 88.0 89.4  PLT 315 [id number]    Basic Metabolic Panel:   Recent Labs Lab 01/24/17 0254 01/25/17 0328  NA 143 145  K 3.5 3.4*  CL 106 109  CO2  26 27  GLUCOSE 132* 123*  BUN <5* 5*  CREATININE 0.53 0.63  CALCIUM 10.7* 10.3     IMAGING I have personally reviewed the radiological images below and agree with the radiology interpretations.  Ct Head Wo Contrast 01/09/2017 1. Subacute infarct of the posterosuperior right parietal lobe with expected evolution since the CT of 01/04/2017. No hemorrhage or mass effect. MRI could be obtained for further characterization.  2. Chronic microvascular ischemia.   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  Reconstitution of basilar tip and LEFT posterior cerebral artery via LEFT posterior communicating artery.   Mr Brain Wo Contrast 01/10/2017 Acute extensive multi vascular territory supra and infratentorial infarcts.  RIGHT petechial hemorrhage without hemorrhagic conversion.   Cerebral angiogram 01/10/2017 S/P bilateral veretbral artery angiograms followed by endovascular complete revascularization of occluded basilar artery X 1pass with trevoprovue 71mm x30 mm retrieval device ,and x 2 passes with solitaire FR 24mm x 40 mm devise achieving a TICI 3 reperfusion. Also used 10.4 mg of IA superselective Integrelin  Ct Head Wo Contrast 01/10/2017 Evolving multifocal infarcts with worsening RIGHT cerebellar cytotoxic edema partially effacing the fourth ventricle. No obstructive hydrocephalus. Contrast staining and/or petechial hemorrhage RIGHT parieto-occipital lobe and RIGHT cerebellar infarcts without frank hemorrhagic conversion.   EEG -  This sedatedEEG is abnormal due to the presence of: 1. Moderatediffuse slowing of thebackground  2. Additional focal slowing over the left centrotemporal region Clinical Correlation of the above findings indicates diffuse cerebral dysfunction that is non-specific in etiology and can be seen with hypoxic/ischemic injury, toxic/metabolic encephalopathies, or medication effect from Propofol.  Additional focal slowing over the left centrotemporal region indicates focal cerebral dysfunction in this region suggestive of underlying structural or physiologic abnormality. There were no electrographic seizures in this study.The absence of epileptiform discharges does not rule out a clinical diagnosis of epilepsy. Clinical correlation is advised.  LE venous Doppler There is evidence of acute deep vein thrombosis involving the peroneal veins of the right lower extremity.  There is no evidence of superficial vein thrombosis involving the right lower extremity. There is no evidence of deep or superficial vein thrombosis involving the left lower extremity. There is no evidence of a Baker's cyst bilaterally.  Carotid Doppler Findings are consistent with a 1-39 percent stenosis involving the right internal carotid artery and the left internal carotid artery. The vertebral arteries demonstrate antegrade flow.   TEE Left ventricle: The cavity size was normal. Wall thickness was increased in a pattern of mild LVH. Systolic function was normal. The estimated ejection fraction was in the range of 60% to 65%. Wall motion was normal; there were no regional wall motion abnormalities. Features are consistent with a pseudonormal left ventricular filling pattern, with concomitant abnormal relaxation and increased filling pressure (grade 2 diastolic dysfunction). - Aortic valve: There was mild regurgitation. Valve area (VTI): 1.76 cm^2. Valve area (Vmax): 2.02 cm^2. Valve area (Vmean): 1.95 cm^2. - Mitral valve: Valve area by pressure half-time: 2.47 cm^2.  CXR 01/20/17 Central vascular congestion without pulmonary edema. Borderline cardiomegaly. Trace bilateral pleural effusion. Hazy right basilar atelectasis or early infiltrate. Tracheostomy tube in place.   CXR 01/24/17 Tracheostomy catheter as described. No pneumothorax. Partial clearing right base patchy airspace opacity.  Atelectatic change remains in the right mid lung as well as in the left base. No new opacity. Stable cardiac prominence. Aortic atherosclerosis present.   Portable CXR 01/25/2017 1. Well-positioned tracheostomy tube. 2. Stable mild cardiomegaly. Stable mild hazy right parahilar lung opacity, favor atelectasis and/or mild pulmonary edema. 3. Aortic atherosclerosis.   PHYSICAL EXAM  General - cachectic, on trach collar, mild respiratory distress due to secretions  Ophthalmologic - Fundi not visualized.  Cardiovascular - Regular rate and rhythm.  Abdomen: leakage around PEG, mildly tender, non distended  Neuro - on trach collar, open eyes to voice, left gaze deviation, sluggish doll's eyes, not tracking objects. Not following commands. Did not blink to threat, pupil 2.5 mm bilaterally, reactive to light. Left facial droop when gimaces to pain.  Frown bilaterally. Withdraw to pain stimulation in all 4 extremities, L>R. DTR diminished, no babinski. Sensation, coordination and gait not tested.   ASSESSMENT/PLAN Ms. DONYAE ROHRBAUGH is a 76 y.o. female with history of colon cancer, metastatic pancreatobiliary adenocarcinoma, HTN, HLD and DM presenting with progressive altered mental state, HA and double vision. She did not receive IV t-PA due to delay in arrival.   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 Infarct embolic likely secondary to hypercoagulable state from metastatic colon cancer.  Resultant not open eyes, not following commands  CT head subacute infarct R parietal lobe, no frank hematoma   MRI  extensive multi vascular supra and infratentorial infarcts. Right petechial hemorrhage.  MRA  basilar artery and R PCA occlusion, I  suspect this is chronic changes due to lack of brainstem and PCA ischemia.   Cerebral angiogram TICI 3  revascularization of occluded basilar artery  2D Echo  EF 60-65%   Carotid Doppler unremarkable   LE venous doppler  - right popliteal vein DVT  EEG diffuse slowing with superimposed left centrotemporal slowing   LDL 110  HgbA1c - 7.2  Lovenox 40 mg sq daily  for VTE prophylaxis Diet NPO time specified  No antithrombotic prior to admission, now on lovenox therapeutic dose.   Therapy recommendations:  SNF   Disposition:  Pending  Acute respiratory failure secondary to stroke   Unable to extubated d/t comatose state and brainstem stroke  trach placement 01/16/17  On trach collar with FiO2 28%  Tolerating well  Trach changed to #6 cuffless - ready for discharge  Right LE DVT - likely due to hypercoagulable state secondary to metastatic colon cancer  Hold off anticoagulation due to large infarct with petechial hemorrhage and recent procedure with anemia  No IVC filter placed   on lovenox therapeutic dose   Fever - resolved  Mild leukocytosis 10.3->11.0 ->12.1 ->9.9->8.6  UA negative  B Cx NGTD  CXR repeat RLL patchy opacity improved  continue unasyn for total 7 - 10 days (day #5 today)  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 TF turned off. Continues to leak. Calls in to trauma. Stat abd CT.  Tube feeding resumed after PEG reinsertion  C diff negative  Goal rate @ 60cc/h   On free water 40cc/h  BUN/Cre stable  Hypertension  SBP goal normotensive  Resume home meds - losartan increase to 50mg  bid  BP stable  Hyperlipidemia  Home meds:  No  statin  LDL 110, goal < 70  No statin due to elevated LFTs and liver metastasis  Diabetes  HgbA1c - 7.2  goal < 7.0  SSI  CBG monitoring  Consider resume lantus once TF starts and based on CBG  home meds - metformin, glipizide, tradjenta - resume once TF starts and based on CBG monitoring  Anemia   hemoglobin stable 7.7->7.9->8.0->7.9->7.7->7.6->7.1 -> 8.3 -> 7.3  Close monitor  Other Stroke Risk Factors  Advanced age  Other Active Problems  Elevated troponins - trending down, likely demand ischemia  Hypokalemia - 3.4 (supplement once PEG issue addressed) Recheck in AM   Hospital day # 16     To contact Stroke Continuity provider, please refer to [email or web address] After hours, contact General Neurology

## 2017-01-26 LAB — CBC
HCT: 27.1 % — ABNORMAL LOW (ref 36.0–46.0)
Hemoglobin: 8.4 g/dL — ABNORMAL LOW (ref 12.0–15.0)
MCH: 27.3 pg (ref 26.0–34.0)
MCHC: 31 g/dL (ref 30.0–36.0)
MCV: 88 fL (ref 78.0–100.0)
PLATELETS: 323 10*3/uL (ref 150–400)
RBC: 3.08 MIL/uL — ABNORMAL LOW (ref 3.87–5.11)
RDW: 14.9 % (ref 11.5–15.5)
WBC: 9.1 10*3/uL (ref 4.0–10.5)

## 2017-01-26 LAB — BASIC METABOLIC PANEL
ANION GAP: 10 (ref 5–15)
BUN: 5 mg/dL — ABNORMAL LOW (ref 6–20)
CALCIUM: 10.1 mg/dL (ref 8.9–10.3)
CO2: 25 mmol/L (ref 22–32)
Chloride: 108 mmol/L (ref 101–111)
Creatinine, Ser: 0.66 mg/dL (ref 0.44–1.00)
Glucose, Bld: 160 mg/dL — ABNORMAL HIGH (ref 65–99)
Potassium: 3 mmol/L — ABNORMAL LOW (ref 3.5–5.1)
Sodium: 143 mmol/L (ref 135–145)

## 2017-01-26 LAB — GLUCOSE, CAPILLARY
GLUCOSE-CAPILLARY: 164 mg/dL — AB (ref 65–99)
GLUCOSE-CAPILLARY: 112 mg/dL — AB (ref 65–99)
Glucose-Capillary: 159 mg/dL — ABNORMAL HIGH (ref 65–99)
Glucose-Capillary: 120 mg/dL — ABNORMAL HIGH (ref 65–99)
Glucose-Capillary: 163 mg/dL — ABNORMAL HIGH (ref 65–99)

## 2017-01-26 NOTE — Progress Notes (Signed)
STROKE TEAM PROGRESS NOTE    HPI April Franklin is a 76 y.o. female with a history of colon cancer who has been having progressive altered mental status and confusion over the past several weeks. It was only mild until 2 days ago at which point it started getting progressively worse, then this morning when she was checked on, she did not arouse appropriately and therefore she was brought into the emergency department.  She also has been complaining of headaches over the past few weeks as well.  On MRI, she has a area of hypodensity in the right parietal region consistent with a subacute infarct.  She also has been complaining of double vision.  LKW: At least several days ago tpa given?: no, outside of window  IR -> Cerebral angiogram 01/10/2017 S/P bilateral veretbral artery angiograms followed by endovascular complete revascularization of occluded basilar artery X 1pass with trevoprovue 12mm x30 mm retrieval device ,and x 2 passes with solitaire FR 5mm x 40 mm devise achieving a TICI 3 reperfusion. Also used 10.4 mg of IA superselective Integrelin    SUBJECTIVE (INTERVAL HISTORY)  PEG to gravity per surgery due to leakage, TF on hold.  OBJECTIVE Temp:  [98.2 F (36.8 C)-99.2 F (37.3 C)] 99.2 F (37.3 C) (03/04 0800) Pulse Rate:  [77-91] 91 (03/04 0800) Cardiac Rhythm: Normal sinus rhythm (03/04 0731) Resp:  [12-25] 18 (03/04 0800) BP: (132-171)/(59-105) 162/81 (03/04 0800) SpO2:  [94 %-100 %] 99 % (03/04 0800) FiO2 (%):  [28 %] 28 % (03/04 0754) Weight:  [69.1 kg (152 lb 5.4 oz)] 69.1 kg (152 lb 5.4 oz) (03/04 0500)  CBC:   Recent Labs Lab 01/25/17 0328 01/26/17 0245  WBC 9.5 9.1  HGB 7.3* 8.4*  HCT 23.5* 27.1*  MCV 89.4 88.0  PLT 317 [id number]    Basic Metabolic Panel:   Recent Labs Lab 01/25/17 0328 01/26/17 0245  NA 145 143  K 3.4* 3.0*  CL 109 108  CO2 27 25  GLUCOSE 123* 160*  BUN 5* 5*  CREATININE 0.63 0.66  CALCIUM 10.3 10.1     IMAGING I  have personally reviewed the radiological images below and agree with the radiology interpretations.  CT Abdomen 3/3 1. Recent percutaneous gastrostomy with Foley in place of the displaced catheter. The Foley is in good position in the stomach is apposed to the abdominal wall. Mild soft tissue reticulation and gas at the PEG site without visible fluid collection. 2. Small ascites without pneumoperitoneum.   Ct Head Wo Contrast 01/09/2017 1. Subacute infarct of the posterosuperior right parietal lobe with expected evolution since the CT of 01/04/2017. No hemorrhage or mass effect. MRI could be obtained for further characterization.  2. Chronic microvascular ischemia.   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  Reconstitution of basilar tip and LEFT posterior cerebral artery via LEFT posterior communicating artery.   Mr Brain Wo Contrast 01/10/2017 Acute extensive multi vascular territory supra and infratentorial infarcts.  RIGHT petechial hemorrhage without hemorrhagic conversion.   Cerebral angiogram 01/10/2017 S/P bilateral veretbral artery angiograms followed by endovascular complete revascularization of occluded basilar artery X 1pass with trevoprovue 54mm x30 mm retrieval device ,and x 2 passes with solitaire FR 35mm x 40 mm devise achieving a TICI 3 reperfusion. Also used 10.4 mg of IA superselective Integrelin  Ct Head Wo Contrast 01/10/2017 Evolving multifocal infarcts with worsening RIGHT cerebellar cytotoxic edema partially effacing the fourth ventricle. No obstructive hydrocephalus. Contrast  staining and/or petechial hemorrhage RIGHT parieto-occipital lobe and RIGHT cerebellar infarcts without frank hemorrhagic conversion.   EEG -  This sedatedEEG is abnormal due to the presence of: 1. Moderatediffuse slowing of thebackground 2. Additional focal slowing over the left centrotemporal region Clinical  Correlation of the above findings indicates diffuse cerebral dysfunction that is non-specific in etiology and can be seen with hypoxic/ischemic injury, toxic/metabolic encephalopathies, or medication effect from Propofol. Additional focal slowing over the left centrotemporal region indicates focal cerebral dysfunction in this region suggestive of underlying structural or physiologic abnormality. There were no electrographic seizures in this study.The absence of epileptiform discharges does not rule out a clinical diagnosis of epilepsy. Clinical correlation is advised.  LE venous Doppler There is evidence of acute deep vein thrombosis involving the peroneal veins of the right lower extremity.  There is no evidence of superficial vein thrombosis involving the right lower extremity. There is no evidence of deep or superficial vein thrombosis involving the left lower extremity. There is no evidence of a Baker's cyst bilaterally.  Carotid Doppler Findings are consistent with a 1-39 percent stenosis involving the right internal carotid artery and the left internal carotid artery. The vertebral arteries demonstrate antegrade flow.   TEE Left ventricle: The cavity size was normal. Wall thickness was increased in a pattern of mild LVH. Systolic function was normal. The estimated ejection fraction was in the range of 60% to 65%. Wall motion was normal; there were no regional wall motion abnormalities. Features are consistent with a pseudonormal left ventricular filling pattern, with concomitant abnormal relaxation and increased filling pressure (grade 2 diastolic dysfunction). - Aortic valve: There was mild regurgitation. Valve area (VTI): 1.76 cm^2. Valve area (Vmax): 2.02 cm^2. Valve area (Vmean): 1.95 cm^2. - Mitral valve: Valve area by pressure half-time: 2.47 cm^2.  CXR 01/20/17 Central vascular congestion without pulmonary edema. Borderline cardiomegaly. Trace bilateral pleural  effusion. Hazy right basilar atelectasis or early infiltrate. Tracheostomy tube in place.   CXR 01/24/17 Tracheostomy catheter as described. No pneumothorax. Partial clearing right base patchy airspace opacity. Atelectatic change remains in the right mid lung as well as in the left base. No new opacity. Stable cardiac prominence. Aortic atherosclerosis present.   Portable CXR 01/25/2017 1. Well-positioned tracheostomy tube. 2. Stable mild cardiomegaly. Stable mild hazy right parahilar lung opacity, favor atelectasis and/or mild pulmonary edema. 3. Aortic atherosclerosis.   PHYSICAL EXAM  General - cachectic, on trach collar, mild respiratory distress due to secretions  Ophthalmologic - Fundi not visualized.  Cardiovascular - Regular rate and rhythm.  Abdomen: mildly tender, non distended, +BS  Neuro - on trach collar, open eyes to voice, left gaze deviation, sluggish doll's eyes, not tracking objects. Not following commands. Did not blink to threat, pupil 2.5 mm bilaterally, reactive to light. Left facial droop when gimaces to pain.  Frown bilaterally. Withdraw to pain stimulation in all 4 extremities, L>R. DTR diminished, no babinski. Sensation, coordination and gait not tested.   ASSESSMENT/PLAN April Franklin is a 76 y.o. female with history of colon cancer, metastatic pancreatobiliary adenocarcinoma, HTN, HLD and DM presenting with progressive altered mental state, HA and double vision. She did not receive IV t-PA due to delay in arrival.   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 Infarct embolic likely secondary to hypercoagulable state from metastatic colon cancer.  Resultant not open eyes, not following commands  CT head  subacute infarct R parietal lobe, no frank hematoma   MRI  extensive multi vascular supra and  infratentorial infarcts. Right petechial hemorrhage.  MRA  basilar artery and R PCA occlusion, I suspect this is chronic changes due to lack of brainstem and PCA ischemia.   Cerebral angiogram TICI 3 revascularization of occluded basilar artery  2D Echo  EF 60-65%   Carotid Doppler unremarkable   LE venous doppler  - right popliteal vein DVT  EEG diffuse slowing with superimposed left centrotemporal slowing   LDL 110  HgbA1c - 7.2  Lovenox 40 mg sq daily  for VTE prophylaxis Diet NPO time specified  No antithrombotic prior to admission, now on lovenox therapeutic dose.   Therapy recommendations:  SNF   Disposition:  Pending  Acute respiratory failure secondary to stroke   Unable to extubated d/t comatose state and brainstem stroke  trach placement 01/16/17  On trach collar with FiO2 28%  Tolerating well  Trach changed to #6 cuffless - ready for discharge  Right LE DVT - likely due to hypercoagulable state secondary to metastatic colon cancer  Hold off anticoagulation due to large infarct with petechial hemorrhage and recent procedure with anemia  No IVC filter placed   on lovenox therapeutic dose   Fever - resolved  Mild leukocytosis 10.3->11.0 ->12.1 ->9.9->8.6  UA negative  B Cx NGTD  CXR repeat RLL patchy opacity improved  continue unasyn for total 7 - 10 days (day #5 today)  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 TF turned off, placed To gravity per surgery, CT abdomen checked, surgery will follow today for a possible PEG replacement.  C  diff negative  Goal rate @ 60cc/h   On free water 40cc/h  BUN/Cre stable  Hypertension  SBP goal normotensive  Resume home meds - losartan increase to 50mg  bid  BP stable  Hyperlipidemia  Home meds:  No statin  LDL 110, goal < 70  No statin due to elevated LFTs and liver metastasis  Diabetes  HgbA1c - 7.2  goal < 7.0  SSI  CBG monitoring  Consider resume lantus once TF starts and based on CBG  home meds - metformin, glipizide, tradjenta - resume once TF starts and based on CBG monitoring  Anemia   hemoglobin stable 7.7->7.9->8.0->7.9->7.7->7.6->7.1 -> 8.3 -> 7.3  Close monitor  Other Stroke Risk Factors  Advanced age  Other Active Problems  Elevated troponins - trending down, likely demand ischemia  Hypokalemia - 3.4 (supplement once PEG issue addressed) Recheck in AM   Hospital day # 17  Total time spent 35 minutes   To contact Stroke Continuity provider, please refer to [email or web address] After hours, contact General Neurology

## 2017-01-26 NOTE — Progress Notes (Signed)
General Surgery [facility]  Assessment & Plan:  Inadvertant dislodgement of PEG tube  PEG tube replaced with Foley cath temporarily  CT scan shows good position without complication  Plan replacement with PEG tube by Trauma or IR hopefully on Monday        Earnstine Regal, MD, [facility]       Office: [phone]    Subjective: Patient resting comfortably, family at bedside.  TF's on hold, gastrostomy (Foley) to drainage.  Objective: Vital signs in last 24 hours: Temp:  [98.2 F (36.8 C)-99.2 F (37.3 C)] 99.2 F (37.3 C) (03/04 0800) Pulse Rate:  [77-91] 91 (03/04 0800) Resp:  [12-25] 18 (03/04 0800) BP: (132-171)/(59-105) 162/81 (03/04 0800) SpO2:  [94 %-100 %] 99 % (03/04 0800) FiO2 (%):  [28 %] 28 % (03/04 0754) Weight:  [69.1 kg (152 lb 5.4 oz)] 69.1 kg (152 lb 5.4 oz) (03/04 0500) Last BM Date: 01/23/17  Intake/Output from previous day: 03/03 0701 - 03/04 0700 In: 788.3 [I.V.:588.3; IV Piggyback:200] Out: 1750 [Urine:1650; Drains:100] Intake/Output this shift: No intake/output data recorded.  Physical Exam: Abdomen - soft without distension; some drainage around Foley catheter - dressing reinforced; small gastric contents in drainage bag  Lab Results:   Recent Labs  01/25/17 0328 01/26/17 0245  WBC 9.5 9.1  HGB 7.3* 8.4*  HCT 23.5* 27.1*  PLT 317 323   BMET  Recent Labs  01/25/17 0328 01/26/17 0245  NA 145 143  K 3.4* 3.0*  CL 109 108  CO2 27 25  GLUCOSE 123* 160*  BUN 5* 5*  CREATININE 0.63 0.66  CALCIUM 10.3 10.1   PT/INR No results for input(s): LABPROT, INR in the last 72 hours. Comprehensive Metabolic Panel:    Component Value Date/Time   NA 143 01/26/2017 0245   NA 145 01/25/2017 0328   NA 138 01/27/2015 0941   NA 137 12/15/2013 0930   K 3.0 (L) 01/26/2017 0245   K 3.4 (L) 01/25/2017 0328   K 4.1 01/27/2015 0941   K 4.1 12/15/2013 0930   CL 108 01/26/2017 0245   CL 109  01/25/2017 0328   CL 103 12/07/2012 0928   CL 100 08/06/2011 0950   CO2 25 01/26/2017 0245   CO2 27 01/25/2017 0328   CO2 26 01/27/2015 0941   CO2 27 12/15/2013 0930   BUN 5 (L) 01/26/2017 0245   BUN 5 (L) 01/25/2017 0328   BUN 11.5 01/27/2015 0941   BUN 13.8 12/15/2013 0930   CREATININE 0.66 01/26/2017 0245   CREATININE 0.63 01/25/2017 0328   CREATININE 0.9 01/27/2015 0941   CREATININE 0.9 12/15/2013 0930   GLUCOSE 160 (H) 01/26/2017 0245   GLUCOSE 123 (H) 01/25/2017 0328   GLUCOSE 388 (H) 01/27/2015 0941   GLUCOSE 228 (H) 12/15/2013 0930   GLUCOSE 286 (H) 12/07/2012 0928   GLUCOSE 222 (H) 08/06/2011 0950   CALCIUM 10.1 01/26/2017 0245   CALCIUM 10.3 01/25/2017 0328   CALCIUM 10.8 (H) 01/27/2015 0941   CALCIUM 10.7 (H) 12/15/2013 0930   AST 79 (H) 01/10/2017 0104   AST 87 (H) 01/09/2017 1356   AST 11 01/27/2015 0941   AST 16 12/15/2013 0930   ALT 117 (H) 01/10/2017 0104   ALT 118 (H) 01/09/2017 1356   ALT 11 01/27/2015 0941   ALT 22 12/15/2013 0930   ALKPHOS 341 (H) 01/10/2017 0104   ALKPHOS 337 (H) 01/09/2017 1356  ALKPHOS 119 01/27/2015 0941   ALKPHOS 93 12/15/2013 0930   BILITOT 1.1 01/10/2017 0104   BILITOT 1.3 (H) 01/09/2017 1356   BILITOT 0.74 01/27/2015 0941   BILITOT 0.89 12/15/2013 0930   PROT 6.5 01/10/2017 0104   PROT 6.2 (L) 01/09/2017 1356   PROT 6.7 01/27/2015 0941   PROT 7.2 12/15/2013 0930   ALBUMIN 2.9 (L) 01/12/2017 0244   ALBUMIN 3.4 (L) 01/10/2017 0104   ALBUMIN 3.7 01/27/2015 0941   ALBUMIN 3.8 12/15/2013 0930    Studies/Results: Ct Abdomen Wo Contrast  Result Date: 01/25/2017 CLINICAL DATA:  Recent PEG placement and removal by patient. A Foley catheter has been placed. Evaluate PEG site. EXAM: CT ABDOMEN WITHOUT CONTRAST TECHNIQUE: Multidetector CT imaging of the abdomen was performed following the standard protocol without IV contrast. COMPARISON:  Abdominal MRI 12/09/2016 FINDINGS: Lower chest: Small pleural effusions, greater on the  right. Atelectasis at the bases. Hepatobiliary: Multiple hepatic metastases. Cholelithiasis without signs of acute cholecystitis. Pancreas: Negative Spleen: Splenomegaly. When accounting for streak artifact there is no definite focal abnormality. Adrenals/Urinary Tract: No definite renal abnormality. No hydronephrosis. Stomach/Bowel: Percutaneous gastrostomy site has been cannulated with a Foley catheter which is in good position. The stomach is appropriately apposed to the abdominal wall. Abdominal wall reticulation around the gastrostomy site, with minimal gas, not unexpected given the history. There is no evidence of abdominal wall fluid collection. No dissecting soft tissue gas. Vascular/Lymphatic: Trace ascites. No pneumoperitoneum. No visualized bowel obstruction. Other: Negative Musculoskeletal: No acute finding IMPRESSION: 1. Recent percutaneous gastrostomy with Foley in place of the displaced catheter. The Foley is in good position in the stomach is apposed to the abdominal wall. Mild soft tissue reticulation and gas at the PEG site without visible fluid collection. 2. Small ascites without pneumoperitoneum. Electronically Signed   By: Monte Fantasia M.D.   On: 01/25/2017 17:08   Dg Chest Port 1 View  Result Date: 01/25/2017 CLINICAL DATA:  Respiratory failure EXAM: PORTABLE CHEST 1 VIEW COMPARISON:  Chest radiograph from one day prior. FINDINGS: Tracheostomy tube tip overlies the tracheal air column 3.2 cm above the carina. Stable cardiomediastinal silhouette with mild cardiomegaly and aortic atherosclerosis. No pneumothorax. No pleural effusion. Mild hazy right parahilar lung opacity appears stable. No new consolidative airspace disease. IMPRESSION: 1. Well-positioned tracheostomy tube. 2. Stable mild cardiomegaly. Stable mild hazy right parahilar lung opacity, favor atelectasis and/or mild pulmonary edema. 3. Aortic atherosclerosis. Electronically Signed   By: Ilona Sorrel M.D.   On: 01/25/2017 08:59       [name] M 01/26/2017  Patient ID: April Franklin, female   DOB: 22-Jan-1941, 76 y.o.   MRN: [id number]

## 2017-01-26 NOTE — Evaluation (Signed)
Physical Therapy Evaluation Patient Details Name: April Franklin MRN: [id number] DOB: [birth year] Today's Date: 01/26/2017   History of Present Illness  Pt is a 76 y/o female with DM, HTN, and colon cancer s/p resection 2009. She has a more recent finding of multiple liver and pancreatic lesions s/p liver biopsy on 12/30/16 which showed new diagnosis of metastatic pancreatobilary adenocarcinoma. 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  Clinical Impression  Pt admitted with above diagnosis. Pt currently with functional limitations due to the deficits listed below (see PT Problem List). On eval, pt required +2 total assist for bed mobility and sitting EOB. No active participation noted from pt. Daughter present in room during eval and reports pt did not sleep well last night. She reports pt able to actively move BLE overnight as well as answering yes/no questions by shaking her head during interactions with family yesterday. Pt's daughter feels she will be able to better participate when she is rested.  PT will attempt a 2-week trial to further assess pt's ability to actively participate and benefit from skilled PT intervention.     Follow Up Recommendations SNF    Equipment Recommendations  None recommended by PT    Recommendations for Other Services       Precautions / Restrictions Precautions Precautions: Other (comment) Precaution Comments: trach collar, PEG tube      Mobility  Bed Mobility Overal bed mobility: Needs Assistance Bed Mobility: Rolling;Supine to Sit;Sit to Supine Rolling: +2 for physical assistance;Total assist   Supine to sit: +2 for physical assistance;Total assist Sit to supine: +2 for physical assistance;Total assist   General bed mobility comments: Pt sat EOB approx 30 seconds with +2 total assist. No active participation. Spontaneous eye opening upon initial change of position for  approx 5 seconds. Eyes closed all other times during session.  Transfers                 General transfer comment: unable  Ambulation/Gait             General Gait Details: unable  Stairs            Wheelchair Mobility    Modified Rankin (Stroke Patients Only) Modified Rankin (Stroke Patients Only) Pre-Morbid Rankin Score: No symptoms Modified Rankin: Severe disability     Balance Overall balance assessment: Needs assistance Sitting-balance support: No upper extremity supported;Feet supported Sitting balance-Leahy Scale: Zero                                       Pertinent Vitals/Pain Pain Assessment: Faces Faces Pain Scale: No hurt    Home Living Family/patient expects to be discharged to:: Skilled nursing facility                      Prior Function Level of Independence: Independent         Comments: Driving. Active in her church. No mobility or lifestyle restrictions.     Hand Dominance        Extremity/Trunk Assessment   Upper Extremity Assessment Upper Extremity Assessment: RUE deficits/detail;LUE deficits/detail RUE Deficits / Details: no active movement noted LUE Deficits / Details: no active movement noted    Lower Extremity Assessment Lower Extremity Assessment: RLE deficits/detail;LLE deficits/detail RLE Deficits / Details: no active movement noted LLE Deficits / Details:  no active movement noted       Communication   Communication: Tracheostomy  Cognition Arousal/Alertness: Lethargic Behavior During Therapy: Flat affect Overall Cognitive Status: Difficult to assess                      General Comments      Exercises     Assessment/Plan    PT Assessment Patient needs continued PT services  PT Problem List Decreased strength;Decreased activity tolerance;Decreased balance;Decreased mobility;Decreased coordination;Decreased cognition;Decreased safety awareness       PT  Treatment Interventions Functional mobility training;Therapeutic activities;Therapeutic exercise;Balance training;Neuromuscular re-education;Cognitive remediation;Patient/family education    PT Goals (Current goals can be found in the Care Plan section)  Acute Rehab PT Goals Patient Stated Goal: per daughter, family wants pt to receive therapy and d/c to a SNF PT Goal Formulation: With family Time For Goal Achievement: 02/09/17 Potential to Achieve Goals: Poor    Frequency Min 2X/week (2-week trial)   Barriers to discharge        Co-evaluation               End of Session   Activity Tolerance: Patient limited by lethargy Patient left: in bed;with call bell/phone within reach;with family/visitor present Nurse Communication: Mobility status PT Visit Diagnosis: Other abnormalities of gait and mobility (R26.89)         Time: 1141-1155 PT Time Calculation (min) (ACUTE ONLY): 14 min   Charges:   PT Evaluation $PT Eval High Complexity: 1 Procedure     PT G CodesLorriane Shire 01/26/2017, 1:44 PM

## 2017-01-27 ENCOUNTER — Inpatient Hospital Stay (HOSPITAL_COMMUNITY): Payer: Medicare Other

## 2017-01-27 LAB — GLUCOSE, CAPILLARY
GLUCOSE-CAPILLARY: 159 mg/dL — AB (ref 65–99)
GLUCOSE-CAPILLARY: 136 mg/dL — AB (ref 65–99)
Glucose-Capillary: 120 mg/dL — ABNORMAL HIGH (ref 65–99)
Glucose-Capillary: 170 mg/dL — ABNORMAL HIGH (ref 65–99)
Glucose-Capillary: 98 mg/dL (ref 65–99)
Glucose-Capillary: 141 mg/dL — ABNORMAL HIGH (ref 65–99)

## 2017-01-27 MED ORDER — IOPAMIDOL (ISOVUE-300) INJECTION 61%
150.0000 mL | Freq: Once | INTRAVENOUS | Status: AC | PRN
  Administered 2017-01-27: 60 mL

## 2017-01-27 MED ORDER — IOPAMIDOL (ISOVUE-300) INJECTION 61%
INTRAVENOUS | Status: AC
  Administered 2017-01-27: 60 mL
  Filled 2017-01-27: qty 150

## 2017-01-27 MED ORDER — POTASSIUM CHLORIDE 20 MEQ PO PACK
20.0000 meq | PACK | Freq: Two times a day (BID) | ORAL | Status: DC
  Administered 2017-01-27: 20 meq
  Filled 2017-01-27 (×2): qty 1

## 2017-01-27 NOTE — Progress Notes (Signed)
STROKE TEAM PROGRESS NOTE   SUBJECTIVE (INTERVAL HISTORY) Her family are at the bedside. While she was awake and asking for water earlier, she is now lethargic and non-communicative. They are planning to take home "when she is better than she is now. We want her at her best". Family has refused SNF. Had PEG replaced with foley this am by trauma. Checking for leaks. If stable to am, plan transfer off stepdown.   OBJECTIVE Temp:  [98 F (36.7 C)-99.3 F (37.4 C)] 99.1 F (37.3 C) (03/05 0752) Pulse Rate:  [72-90] 88 (03/05 0800) Cardiac Rhythm: Normal sinus rhythm (03/05 0800) Resp:  [20-25] 20 (03/05 0800) BP: (140-164)/(65-83) 145/65 (03/05 0850) SpO2:  [90 %-100 %] 95 % (03/05 0800) FiO2 (%):  [28 %] 28 % (03/05 0850) Weight:  [68.8 kg (151 lb 10.8 oz)] 68.8 kg (151 lb 10.8 oz) (03/05 0400)  CBC:   Recent Labs Lab 01/25/17 0328 01/26/17 0245  WBC 9.5 9.1  HGB 7.3* 8.4*  HCT 23.5* 27.1*  MCV 89.4 88.0  PLT 317 [id number]    Basic Metabolic Panel:   Recent Labs Lab 01/25/17 0328 01/26/17 0245  NA 145 143  K 3.4* 3.0*  CL 109 108  CO2 27 25  GLUCOSE 123* 160*  BUN 5* 5*  CREATININE 0.63 0.66  CALCIUM 10.3 10.1    PHYSICAL EXAM  General - cachectic, on trach collar, mild respiratory distress due to secretions  Ophthalmologic - Fundi not visualized.  Cardiovascular - Regular rate and rhythm.  Abdomen: mildly tender, non distended, +BS  Neuro - on trach collar, open eyes to voice, left gaze deviation, sluggish doll's eyes, not tracking objects. Not following commands. Did not blink to threat, pupil 2.5 mm bilaterally, reactive to light. Left facial droop when gimaces to pain.  Frown bilaterally. Withdraw to pain stimulation in all 4 extremities, L>R. DTR diminished, no babinski. Sensation, coordination and gait not tested.   ASSESSMENT/PLAN April Franklin is a 76 y.o. female with history of colon cancer, metastatic pancreatobiliary adenocarcinoma, HTN, HLD and  DM presenting with progressive altered mental state, HA and double vision. She did not receive IV t-PA due to delay in arrival.   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 Infarct embolic likely secondary to hypercoagulable state from metastatic colon cancer.  Resultant not open eyes, not following commands  CT head subacute infarct R parietal lobe, no frank hematoma   MRI  extensive multi vascular supra and infratentorial infarcts. Right petechial hemorrhage.  MRA  basilar artery and R PCA occlusion, I suspect this is chronic changes due to lack of brainstem and PCA ischemia.   Cerebral angiogram TICI 3 revascularization of occluded basilar artery  2D Echo  EF 60-65%   Carotid Doppler unremarkable   LE venous doppler  - right popliteal vein DVT  EEG diffuse slowing with superimposed left centrotemporal slowing   LDL 110  HgbA1c - 7.2  Lovenox  for VTE prophylaxis Diet NPO time specified  No antithrombotic prior to admission, now on lovenox therapeutic dose.   Therapy recommendations:  SNF. Family wants to take her home - concerned this may be difficulty for them - DR. Aviela Blundell discussed LTAC with case manager  Disposition:  Pending  Consider transfer to floor tomorrow, pending PEG issues  Acute respiratory failure secondary to stroke   Unable to extubated d/t comatose state and brainstem stroke  trach placement 01/16/17  On trach collar with FiO2 28%  Trach changed to #6 cuffless  Still lots of secretions.  Right LE DVT - likely due to hypercoagulable state secondary to metastatic colon cancer  Hold off anticoagulation due to large infarct with petechial hemorrhage and recent procedure with anemia  No IVC filter placed   on lovenox therapeutic dose   Fever - resolved  Mild leukocytosis. 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 Replaced by trauma 3/5. They will check to see if leaking  C diff negative  Goal rate @ 60cc/h   On free water 40cc/h  BUN/Cre stable  Ask ST to follow for swallow as able. Can also address language/passey muir if qualifies   Consider transfer to floor tomorrow, pending PEG issues  Hypertension  SBP goal normotensive  Resume home meds - losartan increase to 50mg  bid  BP stable  Hyperlipidemia  Home meds:  No statin  LDL 110, goal < 70  No statin due to elevated LFTs and liver metastasis  Diabetes  HgbA1c - 7.2  goal < 7.0  SSI  CBG monitoring  Consider resume lantus once TF starts and based on CBG  home meds - metformin, glipizide, tradjenta - resume once TF starts and based on CBG monitoring  Anemia   hemoglobin stable 8.4  Close monitor  Other Stroke Risk Factors  Advanced age  Other Active Problems  Elevated troponins - trending down, likely demand ischemia  Hypokalemia - 3.0 (supplement) Recheck in AM   Hospital day # [place] for Pager information 01/27/2017 4:05 PM  I have personally examined this patient, reviewed notes, independently viewed imaging studies, participated in medical decision making and plan of care.ROS completed by me personally and pertinent positives fully documented  I have made any additions or clarifications directly to the  above note. Agree with note above. I had a long discussion at the bedside with the patient's daughter regarding her PEG tube, plan for reinsertion and answered questions. Greater than 50% time during this 35 minute visit was spent on counseling the patient's daughter as well as coordination of care about her PEG tube and discussion with Dr. Hulen Skains, patient's daughter and answered questions  Antony Contras, MD Medical Director [facility] Pager: [phone] 01/27/2017 5:12 PM  To contact Stroke Continuity provider, please refer to [email or web address] After hours, contact General Neurology

## 2017-01-27 NOTE — Evaluation (Signed)
Occupational Therapy Evaluation Patient Details Name: April Franklin MRN: [id number] DOB: 04/28/1941 Today's Date: 01/27/2017    History of Present Illness Pt is a 76 y/o female with DM, HTN, and colon cancer s/p resection 2009. She has a more recent finding of multiple liver and pancreatic lesions s/p liver biopsy on 12/30/16 which showed new diagnosis of metastatic pancreatobilary adenocarcinoma. 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   Clinical Impression   Pt was independent prior to admission. Currently, pt is dependent in all mobility and ADL. Daughter reports pt could be tired as she is more active at night, moving about her bed and throwing her L LE over the bed rail. Pt following one step commands inconsistently. Began educating family in use of equipment, mobility techniques and positioning. Instructed family to talk to pt and keep lights on during the day and quiet with lights off at night. Family would like to maximize services and equipment and care for pt at home.    Follow Up Recommendations  Home health OT;Supervision/Assistance - 24 hour (Home health aide. Family is refusing SNF.)    Equipment Recommendations  3 in 1 bedside commode;Wheelchair (measurements OT);Wheelchair cushion (measurements OT);Hospital bed (mattress overlay, hoyer lift)    Recommendations for Other Services       Precautions / Restrictions Precautions Precaution Comments: trach collar, PEG tube, female catheter Restrictions Weight Bearing Restrictions: No      Mobility Bed Mobility Overal bed mobility: Needs Assistance Bed Mobility: Rolling Rolling: +2 for physical assistance;Total assist         General bed mobility comments: Educated family in use of bed pads to assist in rollling and pulling pt up in bed, floating heels and turning pt every 2 hours  Transfers                 General transfer  comment: unable, will require lift equipment    Balance                                            ADL                                         General ADL Comments: Requires total care. Educated family in positioning and equipment needed to care for pt at home. Daughter want maximum home health services.     Vision   Additional Comments: difficult to assess due to level of arousal, L gaze preference, but able to turn eyes to midline     Perception     Praxis      Pertinent Vitals/Pain Pain Assessment: Faces Faces Pain Scale: No hurt     Hand Dominance Right   Extremity/Trunk Assessment Upper Extremity Assessment Upper Extremity Assessment: Generalized weakness;RUE deficits/detail;LUE deficits/detail RUE Deficits / Details: active elbow flexion and extension only RUE Coordination: decreased fine motor;decreased gross motor LUE Deficits / Details: no active movement noted, full PROM LUE Coordination: decreased gross motor;decreased fine motor   Lower Extremity Assessment Lower Extremity Assessment: Defer to PT evaluation       Communication Communication Communication: Tracheostomy   Cognition Arousal/Alertness: Lethargic Behavior During Therapy: WFL for tasks assessed/performed (pt with appropriate facial expressions) Overall Cognitive Status:  Difficult to assess                     General Comments       Exercises       Shoulder Instructions      Home Living Family/patient expects to be discharged to:: Private residence   Available Help at Discharge: Family;Available 24 hours/day                             Additional Comments: daughter is refusing SNF, plans to take pt home and care for her      Prior Functioning/Environment Level of Independence: Independent        Comments: Driving. Active in her church. No mobility or lifestyle restrictions.        OT Problem List: Decreased  strength;Decreased activity tolerance;Impaired balance (sitting and/or standing);Decreased coordination;Decreased cognition;Impaired vision/perception;Decreased knowledge of use of DME or AE;Impaired UE functional use      OT Treatment/Interventions: DME and/or AE instruction;Therapeutic activities;Balance training;Visual/perceptual remediation/compensation;Patient/family education;Cognitive remediation/compensation    OT Goals(Current goals can be found in the care plan section) Acute Rehab OT Goals Patient Stated Goal: per daughter, they plan to take pt home OT Goal Formulation: With family Time For Goal Achievement: 02/03/17 Potential to Achieve Goals: Fair  OT Frequency: Min 2X/week   Barriers to D/C:            Co-evaluation              End of Session Equipment Utilized During Treatment: Oxygen (28%)  Activity Tolerance: Patient limited by lethargy Patient left: in bed;with call bell/phone within reach;with family/visitor present  OT Visit Diagnosis: Muscle weakness (generalized) (M62.81);Cognitive communication deficit (R41.841);Other symptoms and signs involving cognitive function                ADL either performed or assessed with clinical judgement  Time: 1114-1145 OT Time Calculation (min): 31 min Charges:  OT General Charges $OT Visit: 1 Procedure OT Evaluation $OT Eval High Complexity: 1 Procedure OT Treatments $Therapeutic Activity: 8-22 mins G-Codes:      [name] 01/27/2017, 12:37 PM  [phone]

## 2017-01-27 NOTE — Progress Notes (Signed)
Leaking around G-tube Foley.  16 Fr.  Replaced with a 20Fr, 30cc catheter without leakage, but I did not aspirate gastric contents although she had been on gravity drainage all night.  Will gdt water soluble contrast study to confirm placement.  No leakage for abut 30 minutes since replaced.  Catheter flushed easily.  Kathryne Eriksson. Dahlia Bailiff, MD, [place] [phone] Trauma Surgeon

## 2017-01-27 NOTE — Care Management Important Message (Signed)
Important Message  Patient Details  Name: April Franklin MRN: [id number] Date of Birth: 02/16/1941   Medicare Important Message Given:  Yes    Nathen May 01/27/2017, 4:39 PM

## 2017-01-27 NOTE — Progress Notes (Addendum)
Contrast study shows Foley balloon and catheter tip in the body of the stomach without leakage.  May restart tube feedings at 20cc/h over night, then slowly increase to goal tomorrow.  This patient has been seen and I agree with the findings and treatment plan.  Kathryne Eriksson. Dahlia Bailiff, MD, [place] [phone] (pager) [phone] (direct pager) Trauma Surgeon

## 2017-01-28 ENCOUNTER — Inpatient Hospital Stay (HOSPITAL_COMMUNITY): Payer: Medicare Other

## 2017-01-28 LAB — BASIC METABOLIC PANEL
Anion gap: 10 (ref 5–15)
BUN: <5 mg/dL — ABNORMAL LOW (ref 6–20)
CHLORIDE: 107 mmol/L (ref 101–111)
CO2: 29 mmol/L (ref 22–32)
CREATININE: 0.55 mg/dL (ref 0.44–1.00)
Calcium: 9.9 mg/dL (ref 8.9–10.3)
GFR calc non Af Amer: >60 mL/min (ref >60)
Glucose, Bld: 135 mg/dL — ABNORMAL HIGH (ref 65–99)
POTASSIUM: 2.8 mmol/L — AB (ref 3.5–5.1)
Sodium: 146 mmol/L — ABNORMAL HIGH (ref 135–145)

## 2017-01-28 LAB — GLUCOSE, CAPILLARY
GLUCOSE-CAPILLARY: 136 mg/dL — AB (ref 65–99)
GLUCOSE-CAPILLARY: 146 mg/dL — AB (ref 65–99)
GLUCOSE-CAPILLARY: 158 mg/dL — AB (ref 65–99)
GLUCOSE-CAPILLARY: 162 mg/dL — AB (ref 65–99)
Glucose-Capillary: 138 mg/dL — ABNORMAL HIGH (ref 65–99)
Glucose-Capillary: 149 mg/dL — ABNORMAL HIGH (ref 65–99)
Glucose-Capillary: 164 mg/dL — ABNORMAL HIGH (ref 65–99)

## 2017-01-28 MED ORDER — IOPAMIDOL (ISOVUE-300) INJECTION 61%
150.0000 mL | Freq: Once | INTRAVENOUS | Status: AC | PRN
Start: 2017-01-28 — End: 2017-01-28
  Administered 2017-01-28: 60 mL

## 2017-01-28 MED ORDER — IOPAMIDOL (ISOVUE-300) INJECTION 61%
INTRAVENOUS | Status: AC
  Administered 2017-01-28: 60 mL
  Filled 2017-01-28: qty 150

## 2017-01-28 MED ORDER — SODIUM CHLORIDE 0.45 % IV SOLN
INTRAVENOUS | Status: DC
  Administered 2017-01-28: 12:00:00 via INTRAVENOUS

## 2017-01-28 MED ORDER — ENOXAPARIN SODIUM 60 MG/0.6ML ~~LOC~~ SOLN
60.0000 mg | Freq: Two times a day (BID) | SUBCUTANEOUS | Status: DC
  Administered 2017-01-28 – 2017-01-31 (×6): 60 mg via SUBCUTANEOUS
  Filled 2017-01-28 (×6): qty 0.6

## 2017-01-28 MED ORDER — SODIUM CHLORIDE 0.9 % IV SOLN
30.0000 meq | Freq: Two times a day (BID) | INTRAVENOUS | Status: DC
  Administered 2017-01-28 – 2017-01-29 (×3): 30 meq via INTRAVENOUS
  Filled 2017-01-28 (×3): qty 15

## 2017-01-28 NOTE — Evaluation (Signed)
Clinical/Bedside Swallow Evaluation Patient Details  Name: April Franklin MRN: [id number] Date of Birth: Mar 15, 1941  Today's Date: 01/28/2017 Time: SLP Start Time (ACUTE ONLY): 0850 SLP Stop Time (ACUTE ONLY): 0914 SLP Time Calculation (min) (ACUTE ONLY): 24 min  Past Medical History:  Past Medical History:  Diagnosis Date  . Arthritis   . Colon cancer ([place])   . Diabetes mellitus   . Hyperlipidemia   . Hypertension   . UTI (urinary tract infection)    Past Surgical History:  Past Surgical History:  Procedure Laterality Date  . ABDOMINAL HYSTERECTOMY    . APPENDECTOMY    . CATARACT EXTRACTION    . COLON RESECTION    . COLONOSCOPY    . ESOPHAGOGASTRODUODENOSCOPY N/A 01/16/2017   Procedure: ESOPHAGOGASTRODUODENOSCOPY (EGD);  Surgeon: Georganna Skeans, MD;  Location: [place];  Service: General;  Laterality: N/A;  bedside  . FLEXIBLE SIGMOIDOSCOPY N/A 10/10/2015   Procedure: FLEXIBLE SIGMOIDOSCOPY;  Surgeon: Garlan Fair, MD;  [facility];  Service: Endoscopy;  Laterality: N/A;  UNSEDATED  . IR GENERIC HISTORICAL  01/10/2017   IR PERCUTANEOUS ART THROMBECTOMY/INFUSION INTRACRANIAL INC DIAG ANGIO 01/10/2017 Luanne Bras, MD [facility]  . IR GENERIC HISTORICAL  01/10/2017   IR ANGIO VERTEBRAL SEL SUBCLAVIAN INNOMINATE UNI L MOD SED 01/10/2017 Luanne Bras, MD [facility]  . LEG SURGERY    . PEG PLACEMENT N/A 01/16/2017   Procedure: PERCUTANEOUS ENDOSCOPIC GASTROSTOMY (PEG) PLACEMENT;  Surgeon: Georganna Skeans, MD;  Location: [place];  Service: General;  Laterality: N/A;  . RADIOLOGY WITH ANESTHESIA N/A 01/10/2017   Procedure: RADIOLOGY WITH ANESTHESIA;  Surgeon: Medication Radiologist, MD;  Location: [place];  Service: Radiology;  Laterality: N/A;   HPI:  Pt is a 76 y/o female with DM, HTN, and colon cancer s/p resection 2009. She has a more recent finding of multiple liver and pancreatic lesions s/p liver biopsy on 12/30/16 which showed new diagnosis of  metastatic pancreatobilary adenocarcinoma. She was admitted on 2/15 with altered mental status and found to have multiple cerebral infarcts, basilar occusion on MR:  Patchy reduced diffusion RIGHT greater than LEFT (multivascular territory) cerebellum. Scattered subcentimeter foci of reduced diffusion midbrain, LEFT temporal lobe and RIGHT basal ganglia. Confluent RIGHT greater than LEFT occipital lobe reduced diffusion and, RIGHT mesial temporal lobe. Patchy reduced diffusion predominately within white matter bilateral frontoparietal lobes. Petechial hemorrhage RIGHT parietal and occipital lobes, to lesser extent RIGHT cerebellum. Pt was intubated from 2/16 to 2/22, trach placed, cuffless 6 trach placed 3/1.    Assessment / Plan / Recommendation Clinical Impression  Pt demonstrates signs of a moderate to severe dyspahgia with oral and likely oropharyngeal deficits. Pt required verbal and tactile cues to attend to bolus. Anterior spillage and poor buccal control noted on the right. Verbal cues often needed to sustain pts attention masticating ice or triggering a swallow. No immediate signs of aspiration seen, but risk of silent aspiration high given neuromuscular deficits, cognitive impairment, trach and 7 day intubation.   Advised family that if they would like to feed pt regardless of risk, she would respond to feeding trials at home, but safety cannot be determined without an objective test. Given pts weakness and inattention and potential for oral holding of barium, recommended FEES to family. Daughter on the phone agreed to plan.  FEES to be completed prior to d/c tomorrow prior to potential d/c home to given treatment plan recommendations to f/u with Home Health SLP  and determine safety with PO.  SLP  Visit Diagnosis: Dysphagia, oropharyngeal phase (R13.12)    Aspiration Risk  Severe aspiration risk    Diet Recommendation NPO        Other  Recommendations Oral Care Recommendations: Oral care  QID   Follow up Recommendations Home health SLP      Frequency and Duration min 2x/week  2 weeks       Prognosis Prognosis for Safe Diet Advancement: Fair Barriers to Reach Goals: Cognitive deficits      Swallow Study   General HPI: Pt is a 76 y/o female with DM, HTN, and colon cancer s/p resection 2009. She has a more recent finding of multiple liver and pancreatic lesions s/p liver biopsy on 12/30/16 which showed new diagnosis of metastatic pancreatobilary adenocarcinoma. She was admitted on 2/15 with altered mental status and found to have multiple cerebral infarcts, basilar occusion on MR:  Patchy reduced diffusion RIGHT greater than LEFT (multivascular territory) cerebellum. Scattered subcentimeter foci of reduced diffusion midbrain, LEFT temporal lobe and RIGHT basal ganglia. Confluent RIGHT greater than LEFT occipital lobe reduced diffusion and, RIGHT mesial temporal lobe. Patchy reduced diffusion predominately within white matter bilateral frontoparietal lobes. Petechial hemorrhage RIGHT parietal and occipital lobes, to lesser extent RIGHT cerebellum. Pt was intubated from 2/16 to 2/22, trach placed, cuffless 6 trach placed 3/1.  Type of Study: Bedside Swallow Evaluation Previous Swallow Assessment: none Diet Prior to this Study: NPO;PEG tube Temperature Spikes Noted: No Respiratory Status: Trach Collar History of Recent Intubation: Yes Length of Intubations (days): 7 days Date extubated: 01/16/17 Behavior/Cognition: Lethargic/Drowsy;Requires cueing Oral Cavity Assessment: Within Functional Limits Oral Care Completed by SLP: No Oral Cavity - Dentition: Adequate natural dentition Vision: Impaired for self-feeding Self-Feeding Abilities: Total assist Patient Positioning: Postural control interferes with function (edge of bed) Baseline Vocal Quality: Low vocal intensity;Breathy Volitional Cough: Weak Volitional Swallow: Unable to elicit    Oral/Motor/Sensory Function Overall  Oral Motor/Sensory Function: Moderate impairment Facial ROM: Reduced right Facial Symmetry: Abnormal symmetry right Facial Strength: Reduced right Lingual ROM: Other (Comment) (did not protrude tongue for exam)   Ice Chips Ice chips: Impaired Presentation: Spoon Oral Phase Impairments: Poor awareness of bolus;Reduced lingual movement/coordination;Reduced labial seal Oral Phase Functional Implications: Oral residue;Prolonged oral transit Pharyngeal Phase Impairments: Suspected delayed Swallow   Thin Liquid Thin Liquid: Impaired Presentation: Cup Oral Phase Impairments: Reduced labial seal;Poor awareness of bolus Oral Phase Functional Implications: Right anterior spillage;Prolonged oral transit Pharyngeal  Phase Impairments: Suspected delayed Swallow    Nectar Thick Nectar Thick Liquid: Not tested   Honey Thick Honey Thick Liquid: Not tested   Puree Puree: Not tested   Solid   GO   Solid: Not tested       Herbie Baltimore, MA CCC-SLP [id number]  Lynann Beaver 01/28/2017,12:10 PM

## 2017-01-28 NOTE — Progress Notes (Addendum)
Physical Therapy Treatment Patient Details Name: April Franklin MRN: [id number] DOB: July 21, 1941 Today's Date: 01/28/2017    History of Present Illness Pt is a 76 y/o female with DM, HTN, and colon cancer s/p resection 2009. She has a more recent finding of multiple liver and pancreatic lesions s/p liver biopsy on 12/30/16 which showed new diagnosis of metastatic pancreatobilary adenocarcinoma. 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    PT Comments    Pt continues to require +2 total assist for all bed mobility. The pt was more alert and responding to commands inconsistently. While sitting the pt was able hold her sitting balance for brief periods of time (<10 seconds). Discussed D/C plan with family who reports that they will be taking the pt back home and providing 24 hour assistance. Family denied any questions or concerns following PT session.    Follow Up Recommendations  No PT follow up (family reports plan is to D/C to home)     Equipment Recommendations  None recommended by PT    Recommendations for Other Services       Precautions / Restrictions Precautions Precaution Comments: trach collar, PEG tube Restrictions Weight Bearing Restrictions: No    Mobility  Bed Mobility Overal bed mobility: Needs Assistance Bed Mobility: Supine to Sit;Sit to Supine     Supine to sit: +2 for physical assistance;Total assist Sit to supine: +2 for physical assistance;Total assist   General bed mobility comments: Pt able to hold sitting balance for brief periods (<10 seconds) X2 and noted 1 incident of self postural correction toward left while sitting. Pt primarily requiring max/total assist for sitting balance  Transfers                 General transfer comment: unsafe to perform at this time  Ambulation/Gait                 Stairs            Wheelchair Mobility    Modified  Rankin (Stroke Patients Only) Modified Rankin (Stroke Patients Only) Pre-Morbid Rankin Score: No symptoms Modified Rankin: Severe disability     Balance Overall balance assessment: Needs assistance Sitting-balance support: Bilateral upper extremity supported Sitting balance-Leahy Scale: Zero (brief periods of poor level)                              Cognition Arousal/Alertness: Awake/alert Behavior During Therapy: Flat affect Overall Cognitive Status: Difficult to assess                      Exercises      General Comments General comments (skin integrity, edema, etc.): Talked with patient's daughter who reports feeling confident with taking the pt home from the hospital. HR and SpO2 remaining stable throughout session.       Pertinent Vitals/Pain Pain Assessment: Faces Faces Pain Scale: No hurt    Home Living                      Prior Function            PT Goals (current goals can now be found in the care plan section) Acute Rehab PT Goals Patient Stated Goal: family reports goal is to take pt home from the hospital PT Goal Formulation: With family Time For Goal Achievement: 02/09/17 Potential to  Achieve Goals: Poor Progress towards PT goals: Progressing toward goals    Frequency    Min 2X/week      PT Plan Discharge plan needs to be updated    Co-evaluation             End of Session Equipment Utilized During Treatment: Oxygen Activity Tolerance: Patient limited by fatigue Patient left: in bed;with call bell/phone within reach;with family/visitor present Nurse Communication: Mobility status PT Visit Diagnosis: Other abnormalities of gait and mobility (R26.89)     Time: [id number] PT Time Calculation (min) (ACUTE ONLY): 25 min  Charges:  $Therapeutic Activity: 8-22 mins                    G Codes:       Cassell Clement, PT, CSCS Pager [phone] Office [phone]  01/28/2017, 1:05 PM

## 2017-01-28 NOTE — Progress Notes (Signed)
Patient ID: April Franklin, female   DOB: 03-31-1941, 76 y.o.   MRN: [id number] Trauma F/U  G tube balloon ruptured and G tube came out this AM. RM replaced with new 90fr foley. Site looks good, abd soft. Will repeat G tube study prior to use. I spoke with her family.  Georganna Skeans, MD, MPH, FACS Trauma: [phone] General Surgery: [phone]

## 2017-01-28 NOTE — Progress Notes (Signed)
STROKE TEAM PROGRESS NOTE   SUBJECTIVE (INTERVAL HISTORY) Overall, looks better today than yesterday. Less secretions, more awake. For FEES by ST. Family member is at bedside. Dr. [name] discussed diagnosis, prognosis,  treatment options and plan of care with daughter at [facility] and RN.     OBJECTIVE Temp:  [98 F (36.7 C)-99.3 F (37.4 C)] 98.2 F (36.8 C) (03/06 0749) Pulse Rate:  [69-84] 79 (03/06 0749) Cardiac Rhythm: Normal sinus rhythm (03/06 0700) Resp:  [15-24] 19 (03/06 0749) BP: (128-161)/(55-93) 153/64 (03/06 0844) SpO2:  [95 %-99 %] 95 % (03/06 0749) FiO2 (%):  [28 %] 28 % (03/06 0844) Weight:  [63.2 kg (139 lb 5.3 oz)] 63.2 kg (139 lb 5.3 oz) (03/06 0407)  CBC:   Recent Labs Lab 01/25/17 0328 01/26/17 0245  WBC 9.5 9.1  HGB 7.3* 8.4*  HCT 23.5* 27.1*  MCV 89.4 88.0  PLT 317 [id number]    Basic Metabolic Panel:   Recent Labs Lab 01/26/17 0245 01/28/17 0312  NA 143 146*  K 3.0* 2.8*  CL 108 107  CO2 25 29  GLUCOSE 160* 135*  BUN 5* <5*  CREATININE 0.66 0.55  CALCIUM 10.1 9.9    PHYSICAL EXAM  General - cachectic, on trach collar, mild respiratory distress due to secretions  Ophthalmologic - Fundi not visualized.  Cardiovascular - Regular rate and rhythm.  Abdomen: mildly tender, non distended, +BS  Neuro - on trach collar, open eyes to voice, left gaze deviation, sluggish doll's eyes, not tracking objects. Not following commands. Did not blink to threat, pupil 2.5 mm bilaterally, reactive to light. Left facial droop when gimaces to pain.  Frown bilaterally. Withdraw to pain stimulation in all 4 extremities, L>R. DTR diminished, no babinski. Sensation, coordination and gait not tested.   ASSESSMENT/PLAN Ms. TREYA ALTER is a 76 y.o. female with history of colon cancer, metastatic pancreatobiliary adenocarcinoma, HTN, HLD and DM presenting with progressive altered mental state, HA and double vision. She did not receive IV t-PA due to delay in arrival.    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 Infarct embolic likely secondary to hypercoagulable state from metastatic colon cancer.  Resultant not open eyes, not following commands  CT head subacute infarct R parietal lobe, no frank hematoma   MRI  extensive multi vascular supra and infratentorial infarcts. Right petechial hemorrhage.  MRA  basilar artery and R PCA occlusion, I suspect this is chronic changes due to lack of brainstem and PCA ischemia.   Cerebral angiogram TICI 3 revascularization of occluded basilar artery  2D Echo  EF 60-65%   Carotid Doppler unremarkable   LE venous doppler  - right popliteal vein DVT  EEG diffuse slowing with superimposed left centrotemporal slowing   LDL 110  HgbA1c - 7.2  Lovenox  for VTE prophylaxis Diet NPO time specified  No antithrombotic prior to admission, now on lovenox therapeutic dose.   Therapy recommendations:  SNF. Family wants to take her home - [facility] OT w/ [place] aide, 3N1, w/c, w/c cushion, hospital bed with mattress overlay, hoyer lift  Disposition:  Pending  Hold transfer to floor give PEG issues and low K  Acute respiratory failure secondary to stroke   Unable to extubated d/t comatose state and brainstem stroke  trach placement 01/16/17  On trach collar with FiO2 28%  Trach changed to #6 cuffless  Right LE DVT - likely  due to hypercoagulable state secondary to metastatic colon cancer  No IVC filter placed   on lovenox therapeutic dose   Fever - resolved  Mild leukocytosis. resolved  UA negative  B Cx NGTD  CXR repeat RLL patchy opacity improved  continue unasyn for total 7 - 10 days (day #9 today)  Metastatic colon cancer  Colon cancer status post resection 2009  11/2016 MRI abdomen showed metastasis to liver and pancreas  Metastatic  pancreatobiliary adenocarcinoma w/ multiple liver and pancreatic metastases s/p liver bx 12/30/16  Likely the source of hypercoagulable state  Dysphagia / vomiting / diarrhea  PEG placement 01/17/17   PEG replaced with foley after coming out over the weekend. Tube insertion site leaking 3/3. Replaced by trauma 3/5. 3/6 G-tube balloon ruptured w/ tube replaced by trauma. They will check to see if leaking  C diff negative  Goal rate @ 60cc/h  (off d/d tube coming out this am)  On free water 40cc/h  ST on board - using Caryl Never, also for Fees tomorrow  Hypertension  SBP goal normotensive  Resume home meds - losartan increase to 50mg  bid  BP stable  Hyperlipidemia  Home meds:  No statin  LDL 110, goal < 70  No statin due to elevated LFTs and liver metastasis  Diabetes  HgbA1c - 7.2  goal < 7.0  SSI  CBG monitoring  Consider resume lantus once TF starts and based on CBG  home meds - metformin, glipizide, tradjenta - resume once TF starts and based on CBG monitoring  Anemia   hemoglobin stable 8.4  Close monitor  Other Stroke Risk Factors  Advanced age  Other Active Problems  Elevated troponins - trended down, likely demand ischemia  Hypokalemia - 2.8 (supplement) agagin Recheck in AM   Hospital day # [place] for Pager information 01/28/2017 9:33 AM  I have personally examined this patient, reviewed notes, independently viewed imaging studies, participated in medical decision making and plan of care.ROS completed by me personally and pertinent positives fully documented  I have made any additions or clarifications directly to the above note. Agree with note above. I had a long discussion at the bedside with the patient's daughter regarding her PEG tube, plan for reinsertion and answered questions. Greater than 50% time during this 25 minute visit was spent on counseling the patient's daughter as well as  coordination of care about her PEG tube and discussion with speech therapistt, patient's daughter and answered questions  Antony Contras, MD Medical Director [place] Pager: [phone] 01/28/2017 9:33 AM  To contact Stroke Continuity provider, please refer to [email or web address] After hours, contact General Neurology

## 2017-01-28 NOTE — Progress Notes (Addendum)
Pt G tube feed site was saturated from leaking during the night. Upon trying to change dressing pt has a trach and started coughing, pt foley flew out along with gastric contents. Foley was deflated and balloon ripped in half. MD on call Redmond Pulling paged. MD advised to reinsert foley as soon as possible to prevent site closure and keep tube feeds on hold until after the rounding trauma MD see pt. Foley reinserted with no complications inflated with 5cc (as advised by MD), new dressing applied. Will continue monitor and pass information to day shift RN.

## 2017-01-28 NOTE — Clinical Social Work Note (Signed)
Patient's family has decided to take her home rather than SNF.   CSW signing off. Consult again if any other social work needs arise.  Dayton Scrape, [place]

## 2017-01-28 NOTE — Progress Notes (Signed)
OT Cancellation Note  Patient Details Name: April Franklin MRN: [id number] DOB: 24-Nov-1941   Cancelled Treatment:    Reason Eval/Treat Not Completed: Patient at procedure or test/ unavailable  [name] 01/28/2017, 1:51 PM

## 2017-01-28 NOTE — Progress Notes (Signed)
Trach education given to patient's 2 daughters.  Education given regarding trach care, suctioning, and what to do if the trach comes out.  Family demonstrated cleaning and suctioning of the trach.  Family demonstrated ability to care for the patient in this regard.  Encouraged them to perform these duties with hospital staff as need arises.

## 2017-01-28 NOTE — Evaluation (Signed)
Passy-Muir Speaking Valve - Evaluation Patient Details  Name: April Franklin MRN: [id number] Date of Birth: 03-31-1941  Today's Date: 01/28/2017 Time: 0850-0914 SLP Time Calculation (min) (ACUTE ONLY): 24 min  Past Medical History:  Past Medical History:  Diagnosis Date  . Arthritis   . Colon cancer ([place])   . Diabetes mellitus   . Hyperlipidemia   . Hypertension   . UTI (urinary tract infection)    Past Surgical History:  Past Surgical History:  Procedure Laterality Date  . ABDOMINAL HYSTERECTOMY    . APPENDECTOMY    . CATARACT EXTRACTION    . COLON RESECTION    . COLONOSCOPY    . ESOPHAGOGASTRODUODENOSCOPY N/A 01/16/2017   Procedure: ESOPHAGOGASTRODUODENOSCOPY (EGD);  Surgeon: Georganna Skeans, MD;  Location: [place];  Service: General;  Laterality: N/A;  bedside  . FLEXIBLE SIGMOIDOSCOPY N/A 10/10/2015   Procedure: FLEXIBLE SIGMOIDOSCOPY;  Surgeon: Garlan Fair, MD;  [facility];  Service: Endoscopy;  Laterality: N/A;  UNSEDATED  . IR GENERIC HISTORICAL  01/10/2017   IR PERCUTANEOUS ART THROMBECTOMY/INFUSION INTRACRANIAL INC DIAG ANGIO 01/10/2017 Luanne Bras, MD [facility]  . IR GENERIC HISTORICAL  01/10/2017   IR ANGIO VERTEBRAL SEL SUBCLAVIAN INNOMINATE UNI L MOD SED 01/10/2017 Luanne Bras, MD [facility]  . LEG SURGERY    . PEG PLACEMENT N/A 01/16/2017   Procedure: PERCUTANEOUS ENDOSCOPIC GASTROSTOMY (PEG) PLACEMENT;  Surgeon: Georganna Skeans, MD;  Location: [place];  Service: General;  Laterality: N/A;  . RADIOLOGY WITH ANESTHESIA N/A 01/10/2017   Procedure: RADIOLOGY WITH ANESTHESIA;  Surgeon: Medication Radiologist, MD;  Location: [place];  Service: Radiology;  Laterality: N/A;   HPI:  Pt is a 76 y/o female with DM, HTN, and colon cancer s/p resection 2009. She has a more recent finding of multiple liver and pancreatic lesions s/p liver biopsy on 12/30/16 which showed new diagnosis of metastatic pancreatobilary adenocarcinoma. She was  admitted on 2/15 with altered mental status and found to have multiple cerebral infarcts, basilar occusion on MR:  Patchy reduced diffusion RIGHT greater than LEFT (multi   Assessment / Plan / Recommendation Clinical Impression  Pt tolerated PMSV without incident; redirection of air to upper airway was adequate. No signs of back pressure observed, no change in vital signs. Pt observed at edge of bed and reclined in bed. Pt was able to phonate with single words but breath support severely impaired, max verbal cues needed for minimal increase in volume with sustained phonation. Pts cognitive impairment and fatigue also limited verbal interactions despite max contextual and verbal cues. Given expected d/c tomorrow, discussed recommendations with family and reviewed precautions and instructions verbal, in writing and via demosntration. Pts family member able to demonstrate placement and removal of valve and verbalize precautions. Recommend pt use PMSV with full staff or family supervision. See next note regarding swallowing.  SLP Visit Diagnosis: Aphonia (R49.1)    SLP Assessment  Patient needs continued Speech Lanaguage Pathology Services    Follow Up Recommendations  Home health SLP    Frequency and Duration min 2x/week  2 weeks    PMSV Trial PMSV was placed for: 20 minutes Able to redirect subglottic air through upper airway: Yes Able to Attain Phonation: Yes Voice Quality: Breathy;Low vocal intensity Able to Expectorate Secretions: Yes Level of Secretion Expectoration with PMSV:  (upper airway) Breath Support for Phonation: Severely decreased Intelligibility: Intelligibility reduced Word: 25-49% accurate Phrase: 25-49% accurate Sentence: Not tested Conversation: Not tested Respirations During Trial: 15 SpO2 During Trial: 100 %  Tracheostomy Tube  Additional Tracheostomy Tube Assessment Trach Collar Period: all waking hours Secretion Description: not obsrved Frequency of Tracheal  Suctioning: 3x a day Level of Secretion Expectoration: Tracheal    Vent Dependency  FiO2 (%): 28 %    Cuff Deflation Trial  GO Herbie Baltimore, MA CCC-SLP [phone]           Lynann Beaver 01/28/2017, 11:59 AM

## 2017-01-29 LAB — GLUCOSE, CAPILLARY
GLUCOSE-CAPILLARY: 196 mg/dL — AB (ref 65–99)
Glucose-Capillary: 223 mg/dL — ABNORMAL HIGH (ref 65–99)
Glucose-Capillary: 184 mg/dL — ABNORMAL HIGH (ref 65–99)
Glucose-Capillary: 267 mg/dL — ABNORMAL HIGH (ref 65–99)
Glucose-Capillary: 257 mg/dL — ABNORMAL HIGH (ref 65–99)
Glucose-Capillary: 254 mg/dL — ABNORMAL HIGH (ref 65–99)

## 2017-01-29 LAB — BASIC METABOLIC PANEL
ANION GAP: 14 (ref 5–15)
CALCIUM: 9.8 mg/dL (ref 8.9–10.3)
CO2: 25 mmol/L (ref 22–32)
CREATININE: 0.65 mg/dL (ref 0.44–1.00)
Chloride: 106 mmol/L (ref 101–111)
GFR calc Af Amer: >60 mL/min (ref >60)
GLUCOSE: 218 mg/dL — AB (ref 65–99)
Potassium: 3.5 mmol/L (ref 3.5–5.1)
Sodium: 145 mmol/L (ref 135–145)

## 2017-01-29 LAB — MAGNESIUM
MAGNESIUM: 2 mg/dL (ref 1.7–2.4)
Magnesium: 1.6 mg/dL — ABNORMAL LOW (ref 1.7–2.4)

## 2017-01-29 MED ORDER — MAGNESIUM SULFATE 50 % IJ SOLN: 1.0000 g | Freq: Once | INTRAMUSCULAR | Status: DC

## 2017-01-29 MED ORDER — LIVING WELL WITH DIABETES BOOK
Freq: Once | Status: AC
  Administered 2017-01-29: 13:00:00
  Filled 2017-01-29: qty 1

## 2017-01-29 MED ORDER — POTASSIUM CHLORIDE 20 MEQ/15ML (10%) PO SOLN
30.0000 meq | Freq: Two times a day (BID) | ORAL | Status: DC
  Administered 2017-01-29 – 2017-01-31 (×4): 30 meq
  Filled 2017-01-29 (×4): qty 30

## 2017-01-29 MED ORDER — INSULIN STARTER KIT- PEN NEEDLES (ENGLISH)
1.0000 | Freq: Once | Status: DC
  Filled 2017-01-29 (×2): qty 1

## 2017-01-29 MED ORDER — POTASSIUM CHLORIDE 20 MEQ/15ML (10%) PO SOLN: 30.0000 meq | Freq: Two times a day (BID) | ORAL | Status: DC

## 2017-01-29 MED ORDER — MAGNESIUM SULFATE IN D5W 1-5 GM/100ML-% IV SOLN
1.0000 g | Freq: Once | INTRAVENOUS | Status: AC
  Administered 2017-01-29: 1 g via INTRAVENOUS
  Filled 2017-01-29: qty 100

## 2017-01-29 MED ORDER — INSULIN GLARGINE 100 UNIT/ML ~~LOC~~ SOLN
5.0000 [IU] | Freq: Every day | SUBCUTANEOUS | Status: DC
  Administered 2017-01-29: 5 [IU] via SUBCUTANEOUS
  Filled 2017-01-29: qty 0.05

## 2017-01-29 MED ORDER — GLUCERNA 1.2 CAL PO LIQD
1000.0000 mL | ORAL | Status: DC
  Administered 2017-01-30: 1000 mL
  Filled 2017-01-29 (×4): qty 1000

## 2017-01-29 NOTE — Progress Notes (Signed)
Placed Pt on humidified Room Air ask requested for DC home. Pt Sp02 within normal limits at this time.

## 2017-01-29 NOTE — Plan of Care (Signed)
Problem: Health Behavior/Discharge Planning: Goal: Ability to manage health-related needs will improve Outcome: Progressing CM following. Patient's family plans to take her home at discharge.   Problem: Activity: Goal: Risk for activity intolerance will decrease Outcome: Progressing PT/OT consulted and following.   Problem: Nutrition: Goal: Adequate nutrition will be maintained Outcome: Progressing Patient has been tolerating tube feeds via G tube. Speech therapy started patient on dysphagia 3 diet today.   Problem: Coping: Goal: Ability to verbalize positive feelings about self will improve Outcome: Not Progressing Patient unable to verbalize at this time.

## 2017-01-29 NOTE — Progress Notes (Addendum)
Received page from Neoma Laming, [place], [place] regarding request for family education on insulin. Patient was using [place] insulin pen and oral DM medications prior to admission. Family will now be monitoring glucose and administering insulin. Discussed glucose monitoring and reviewed technique for glucose monitoring. Discussed glucose goal, hypoglycemia, and hyperglycemia along with treatment for both. Encouraged family to get glucose gel to have on hand for hypoglycemia treatment and rub in oral mucosa if needed.  Educated patient's daughter on insulin pen use. Reviewed all steps of insulin pen including attachment of needle, 2-unit air shot, dialing up dose, giving injection, removing needle, disposal of sharps, storage of unused insulin, disposal of insulin etc. Patient's daughter is able to provide successful return demonstration. Informed patient's daughter that she should also be receiving Living Well with Diabetes book and insulin pen starter kit from nursing once it is received from pharmacy.  MD to give patient Rxs for insulin pens and insulin pen needles.  Thanks, Barnie Alderman, RN, MSN, CDE Diabetes Coordinator [facility] [phone] (Team Pager from 8am to 5pm)

## 2017-01-29 NOTE — Progress Notes (Signed)
Occupational Therapy Treatment Patient Details Name: April [place] MRN: [id number] DOB: 10-10-1941 Today's Date: 01/29/2017    History of present illness Pt is a 76 y/o female with DM, HTN, and colon cancer s/p resection 2009. She has a more recent finding of multiple liver and pancreatic lesions s/p liver biopsy on 12/30/16 which showed new diagnosis of metastatic pancreatobilary adenocarcinoma. 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   OT comments  Pt continues to be lethargic, although nodding her head today and making facial expressions. Requires total assist for bed mobility and ADL. Focus of session on educating family in how to assist pt with mobility, positioning and UE PROM. Daughter demonstrating understanding.  Follow Up Recommendations  Home health OT;Supervision/Assistance - 24 hour (home health)    Equipment Recommendations  3 in 1 bedside commode;Wheelchair (measurements OT);Wheelchair cushion (measurements OT);Hospital bed (hoyer lift)    Recommendations for Other Services      Precautions / Restrictions Precautions Precautions: Other (comment) Precaution Comments: trach collar, PEG tube       Mobility Bed Mobility Overal bed mobility: Needs Assistance Bed Mobility: Rolling Rolling: Total assist            Transfers                 General transfer comment: requires a lift    Balance                                   ADL Overall ADL's : Needs assistance/impaired Eating/Feeding: Total assistance;Bed level (HOB up)                                     General ADL Comments: educated in use of bed pad to pull pt up in bed and roll      Vision                     Perception     Praxis      Cognition   Behavior During Therapy: Flat affect Overall Cognitive Status: Difficult to assess                  General  Comments: following commands inconsistently, nodding head yes      Exercises     Shoulder Instructions       General Comments      Pertinent Vitals/ Pain       Pain Assessment: Faces Faces Pain Scale: Hurts a little bit Pain Location: L shoulder with ROM Pain Descriptors / Indicators: Grimacing Pain Intervention(s): Repositioned;Limited activity within patient's tolerance  Home Living                                          Prior Functioning/Environment              Frequency  Min 2X/week        Progress Toward Goals  OT Goals(current goals can now be found in the care plan section)  Progress towards OT goals: Not progressing toward goals - comment  Acute Rehab OT Goals Patient Stated Goal: family reports goal is to take pt home from the hospital OT  Goal Formulation: With family Time For Goal Achievement: 02/03/17 Potential to Achieve Goals: [place]  Plan Discharge plan remains appropriate    Co-evaluation                 End of Session Equipment Utilized During Treatment: Oxygen (28%)  OT Visit Diagnosis: Muscle weakness (generalized) (M62.81);Cognitive communication deficit (R41.841);Other symptoms and signs involving cognitive function   Activity Tolerance Patient limited by lethargy   Patient Left in bed;with call bell/phone within reach;with family/visitor present   Nurse Communication          Time: 0813-8871 OT Time Calculation (min): 22 min  Charges:     [name] 01/29/2017, 3:44 PM  [phone]

## 2017-01-29 NOTE — Discharge Summary (Addendum)
Stroke Discharge Summary  Patient ID: April Franklin   MRN: [id number]      DOB: 1941/04/21  Date of Admission: 01/09/2017 Date of Discharge: 02/01/2017  Attending Physician:  No att. providers found, Stroke MD Consultant(s):    pulmonary/intensive care, trauma for PEG placement Patient's PCP:  [name], MD  DISCHARGE DIAGNOSIS: Acute extensive multi vascular territory supra and infratentorial infarcts. RIGHT petechial hemorrhage without hemorrhagic conversion. Initial basilar artery occlusion with restoration of flow s/p mechanical thrombectomy but multiple posterior circulation infarcts. 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  Past Medical History:  Diagnosis Date  . Arthritis   . Colon Franklin ([place])   . Diabetes mellitus   . Hyperlipidemia   . Hypertension   . UTI (urinary tract infection)    Past Surgical History:  Procedure Laterality Date  . ABDOMINAL HYSTERECTOMY    . APPENDECTOMY    . CATARACT EXTRACTION    . COLON RESECTION    . COLONOSCOPY    . ESOPHAGOGASTRODUODENOSCOPY N/A 01/16/2017   Procedure: ESOPHAGOGASTRODUODENOSCOPY (EGD);  Surgeon: Georganna Skeans, MD;  Location: [facility];  Service: General;  Laterality: N/A;  bedside  . FLEXIBLE SIGMOIDOSCOPY N/A 10/10/2015   Procedure: FLEXIBLE SIGMOIDOSCOPY;   Surgeon: Garlan Fair, MD;  Location: [facility];  Service: Endoscopy;  Laterality: N/A;  UNSEDATED  . IR GENERIC HISTORICAL  01/10/2017   IR PERCUTANEOUS ART THROMBECTOMY/INFUSION INTRACRANIAL INC DIAG ANGIO 01/10/2017 Luanne Bras, MD [facility]  . IR GENERIC HISTORICAL  01/10/2017   IR ANGIO VERTEBRAL SEL SUBCLAVIAN INNOMINATE UNI L MOD SED 01/10/2017 Luanne Bras, MD [facility]  . LEG SURGERY    . PEG PLACEMENT N/A 01/16/2017   Procedure: PERCUTANEOUS ENDOSCOPIC GASTROSTOMY (PEG) PLACEMENT;  Surgeon: Georganna Skeans, MD;  Location: [place];  Service: General;  Laterality: N/A;  . RADIOLOGY WITH ANESTHESIA N/A 01/10/2017   Procedure: RADIOLOGY WITH ANESTHESIA;  Surgeon: Medication Radiologist, MD;  Location: ;  Service: Radiology;  Laterality: N/A;    Allergies as of 01/31/2017   No Known Allergies     Medication List    STOP taking these medications   cephALEXin 500 MG capsule Commonly known as:  KEFLEX   glipiZIDE 10 MG tablet Commonly known as:  GLUCOTROL   linagliptin 5 MG Tabs tablet Commonly known as:  TRADJENTA   metFORMIN 1000 MG tablet Commonly known as:  GLUCOPHAGE   traMADol 50 MG tablet Commonly known as:  ULTRAM   TRESIBA FLEXTOUCH 200 UNIT/ML Sopn Generic drug:  Insulin Degludec     TAKE these medications   chlorhexidine 0.12 % solution Commonly known as:  PERIDEX 15 mLs by Mouth Rinse route 2 (two) times daily.   docusate 50 MG/5ML liquid Commonly known as:  COLACE Place 5 mLs (50 mg total) into feeding tube at bedtime.   enoxaparin  60 MG/0.6ML injection Commonly known as:  LOVENOX Inject 0.6 mLs (60 mg total) into the skin every 12 (twelve) hours.   free water Soln Place 240 mLs into feeding tube every 6 (six) hours.   insulin aspart 100 UNIT/ML injection Commonly known as:  novoLOG Inject 0-15 Units into the skin every 4 (four) hours.   insulin aspart 100 UNIT/ML injection Commonly known as:  novoLOG Inject 8 Units  into the skin every 4 (four) hours.   insulin glargine 100 UNIT/ML injection Commonly known as:  LANTUS Inject 0.2 mLs (20 Units total) into the skin at bedtime.   losartan 50 MG tablet Commonly known as:  COZAAR Take 50 mg by mouth daily.   magnesium hydroxide 400 MG/5ML suspension Commonly known as:  MILK OF MAGNESIA Place 30 mLs into feeding tube daily as needed for mild constipation.   pantoprazole sodium 40 mg/20 mL Pack Commonly known as:  PROTONIX Place 20 mLs (40 mg total) into feeding tube daily.   potassium chloride 20 MEQ/15ML (10%) Soln Place 22.5 mLs (30 mEq total) into feeding tube 2 (two) times daily.   sennosides 8.8 MG/5ML syrup Commonly known as:  SENOKOT Place 5 mLs into feeding tube at bedtime.   sodium phosphate 7-19 GM/118ML Enem Place 133 mLs (1 enema total) rectally daily as needed for severe constipation.     ASK your doctor about these medications   insulin starter kit- pen needles Misc 1 kit by Other route once. Ask about: Should I take this medication?       LABORATORY STUDIES CBC    Component Value Date/Time   WBC 9.1 01/26/2017 0245   RBC 3.08 (L) 01/26/2017 0245   HGB 8.4 (L) 01/26/2017 0245   HGB 12.4 01/27/2015 0939   HCT 27.1 (L) 01/26/2017 0245   HCT 37.5 01/27/2015 0939   PLT 323 01/26/2017 0245   PLT 217 01/27/2015 0939   MCV 88.0 01/26/2017 0245   MCV 86.7 01/27/2015 0939   MCH 27.3 01/26/2017 0245   MCHC 31.0 01/26/2017 0245   RDW 14.9 01/26/2017 0245   RDW 12.9 01/27/2015 0939   LYMPHSABS 1.2 01/10/2017 0526   LYMPHSABS 1.6 01/27/2015 0939   MONOABS 0.4 01/10/2017 0526   MONOABS 0.2 01/27/2015 0939   EOSABS 0.0 01/10/2017 0526   EOSABS 0.1 01/27/2015 0939   BASOSABS 0.0 01/10/2017 0526   BASOSABS 0.0 01/27/2015 0939   CMP    Component Value Date/Time   NA 145 01/29/2017 0245   NA 138 01/27/2015 0941   K 3.5 01/29/2017 0245   K 4.1 01/27/2015 0941   CL 106 01/29/2017 0245   CL 103 12/07/2012 0928   CO2 25  01/29/2017 0245   CO2 26 01/27/2015 0941   GLUCOSE 218 (H) 01/29/2017 0245   GLUCOSE 388 (H) 01/27/2015 0941   GLUCOSE 286 (H) 12/07/2012 0928   BUN <5 (L) 01/29/2017 0245   BUN 11.5 01/27/2015 0941   CREATININE 0.65 01/29/2017 0245   CREATININE 0.9 01/27/2015 0941   CALCIUM 9.8 01/29/2017 0245   CALCIUM 10.8 (H) 01/27/2015 0941   PROT 6.5 01/10/2017 0104   PROT 6.7 01/27/2015 0941   ALBUMIN 2.9 (L) 01/12/2017 0244   ALBUMIN 3.7 01/27/2015 0941   AST 79 (H) 01/10/2017 0104   AST 11 01/27/2015 0941   ALT 117 (H) 01/10/2017 0104   ALT 11 01/27/2015 0941   ALKPHOS 341 (H) 01/10/2017 0104   ALKPHOS 119 01/27/2015 0941   BILITOT 1.1 01/10/2017 0104  BILITOT 0.74 01/27/2015 0941   GFRNONAA >60 01/29/2017 0245   GFRAA >60 01/29/2017 0245   COAGS Lab Results  Component Value Date   INR 1.18 01/15/2017   INR 1.28 01/10/2017   Lipid Panel    Component Value Date/Time   CHOL 194 01/10/2017 0104   TRIG 103 01/13/2017 0534   HDL 67 01/10/2017 0104   CHOLHDL 2.9 01/10/2017 0104   VLDL 17 01/10/2017 0104   LDLCALC 110 (H) 01/10/2017 0104   HgbA1C  Lab Results  Component Value Date   HGBA1C 7.2 (H) 01/10/2017   Urinalysis    Component Value Date/Time   COLORURINE YELLOW 01/22/2017 [place] 01/22/2017 1218   LABSPEC 1.026 01/22/2017 1218   PHURINE 6.0 01/22/2017 1218   GLUCOSEU 50 (A) 01/22/2017 1218   HGBUR NEGATIVE 01/22/2017 1218   BILIRUBINUR NEGATIVE 01/22/2017 [place] 01/22/2017 1218   PROTEINUR 30 (A) 01/22/2017 1218   NITRITE NEGATIVE 01/22/2017 1218   LEUKOCYTESUR NEGATIVE 01/22/2017 1218    SIGNIFICANT DIAGNOSTIC STUDIES  Ct Abdomen Wo Contrast 01/25/2017 1. Recent percutaneous gastrostomy with Foley in place of the displaced catheter. The Foley is in good position in the stomach is apposed to the abdominal wall. Mild soft tissue reticulation and gas at the PEG site without visible fluid collection.  2. Small ascites  without pneumoperitoneum.    Ct Head Wo Contrast 01/31/2017 1. No acute finding.  2. Expected evolution of recent extensive cerebral and cerebellar infarction. No evidence of infarct progression. Known petechial hemorrhage without progression.  3. Decrease cytotoxic edema in the posterior fossa with normalized fourth ventricle.   Ct Head Wo Contrast 01/10/2017 Evolving multifocal infarcts with worsening RIGHT cerebellar cytotoxic edema partially effacing the fourth ventricle. No obstructive hydrocephalus. Contrast staining and/or petechial hemorrhage RIGHT parieto-occipital lobe and RIGHT cerebellar infarcts without frank hemorrhagic conversion.    Ct Head Wo Contrast  01/09/2017 1. Subacute infarct of the posterosuperior right parietal lobe with expected evolution since the CT of 01/04/2017. No hemorrhage or mass effect. MRI could be obtained for further characterization.  2. Chronic microvascular ischemia.     Ct Head Wo Contrast 01/04/2017 1. Small subacute to chronic infarction within the right parietal lobe. If clinically indicated this can be further assessed with MRI.  2. No acute intracranial hemorrhage, focal mass effect, or hydrocephalus.  3. Mild progression in brain parenchymal volume loss and chronic microvascular ischemic changes.    Mr [name] Head Wo Contrast 01/10/2017 MRI HEAD:  Acute extensive multi vascular territory supra and infratentorial infarcts. RIGHT petechial hemorrhage without hemorrhagic conversion.  MRA HEAD:  Emergent basilar artery and RIGHT posterior cerebral artery occlusion. Reconstitution of basilar tip and LEFT posterior cerebral artery via LEFT posterior communicating artery.      Dg Chest Port 1 View 01/25/2017 1. Well-positioned tracheostomy tube.  2. Stable mild cardiomegaly. Stable mild hazy right parahilar lung opacity, favor atelectasis and/or mild pulmonary edema.  3. Aortic atherosclerosis.   Dg Chest Port 1 View 01/24/2017 Tracheostomy  catheter as described. No pneumothorax. Partial clearing right base patchy airspace opacity. Atelectatic change remains in the right mid lung as well as in the left base. No new opacity. Stable cardiac prominence. Aortic atherosclerosis present.     Dg Chest Port 1 View 01/20/2017 Central vascular congestion without pulmonary edema. Borderline cardiomegaly. Trace bilateral pleural effusion. Hazy right basilar atelectasis or early infiltrate. Tracheostomy tube in place.  Dg Chest Port 1 View 9/98/3382 No complication following tracheostomy  tube placement.   Dg Chest Port 1 View 01/16/2017 1. Lines and tubes in stable position.  2. No acute cardiopulmonary disease    Dg Chest Port 1 View  01/14/2017 No focal lung disease. Support apparatuses as described.   Dg Chest Port 1 View 01/12/2017 1. Stable good positioning of endotracheal and orogastric tubes.  2. No evidence for active cardiopulmonary disease.   Dg Chest Port 1 View 01/11/2017 1. Endotracheal tube in satisfactory position with the tip positioned 2.5 cm above the carina.  2. No radiographic evidence for acute cardiopulmonary abnormality.   Portable Chest Xray 01/10/2017 Endotracheal tube tip projects 2.2 cm above the carina. Nasogastric tube past proximal stomach. No acute cardiopulmonary process.    Dg Ugi W/water Sol Cm 01/28/2017 Contrast administration via the indwelling PEG tube demonstrating no extraluminal contrast leakage or other complicating feature.     Dg Ugi W/water Sol Cm 01/27/2017 The Foley catheter/G tube has been appropriately placed in the stomach.    Ir Percutaneous Art Thrombectomy/infusion Intracranial Inc Diag Angio 01/14/2017 Status post endovascular complete revascularization of occluded basilar artery and the posterior cerebral arteries and the right superior cerebellar artery with 1 pass with the 4 mm x 30 mm Trevo ProVue retrieval device, and 2 passes with the Solitaire 4 mm x 40 mm retrieval  device, and 10.4 mg of super selective intracranial intra-arterial Integrilin achieving a TICI 3 reperfusion. PLAN: Patient transferred to the CT scanner for postprocedural CT scan of brain.      Carotid Doppler   There is 1-39% bilateral ICA stenosis. Vertebral artery flow is antegrade. '  Lower Extremity Venous Dopplers There is evidence of acute deep vein thrombosis involving the peroneal veins of the right lower extremity. There is no evidence of superficial vein thrombosis involving the right lower extremity. There is no evidence of deep or superficial vein thrombosis involving the left lower extremity. There is no evidence of a Baker's cyst bilaterally.  2D Echocardiogram  - Left ventricle: The cavity size was normal. Wall thickness was increased in a pattern of mild LVH. Systolic function was normal. The estimated ejection fraction was in the range of 60% to 65%. Wall motion was normal; there were no regional wall motion abnormalities. Features are consistent with a pseudonormal left ventricular filling pattern, with concomitant abnormal relaxation and increased filling pressure (grade 2 diastolic dysfunction). - Aortic valve: There was mild regurgitation. Valve area (VTI): 1.76 cm^2. Valve area (Vmax): 2.02 cm^2. Valve area (Vmean): 1.95 cm^2. - Mitral valve: Valve area by pressure half-time: 2.47 cm^2.   HISTORY OF PRESENT ILLNESS April A [name] a 76 y.o.femalewith a history of colon Franklin who had been having progressive altered mental status and confusion over the past several weeks prior to admission. It was only mild until 2 days before admission at which point it started getting progressively worse. On the morning of admission she did not arouse appropriately and therefore she was brought into the [facility] emergency department. She also had been complaining of headaches for several weeks. On CT, she was found to have an area of hypodensity in the right parietal region consistent  with a subacute infarct. She had also reported double vision. She was last known well several days prior to admission. Patient was not administered IV t-PA secondary to being outside of the window. MRA revealed basilar occlusion. Given continued progression of neurologic worsening, intervention was considered reasonable. She was sent to interventional radiology where she had TICI3 revascularization of an occluded basilar  artery with one pass of the trevo and 2 passes of the solitaire as well as intra-arterial Integrilin  She was then admitted to the neuro ICU for further evaluation and treatment.    HOSPITAL COURSE April Franklin is a 76 y.o. female with history of colon Franklin, metastatic pancreatobiliary adenocarcinoma, HTN, HLD and DM  who presented with a progressive altered mental state, HA and double vision. She did not receive IV t-PA due to delay in arrival. An MRA revealed a basilar occlusion. She was sent to interventional radiology where she had TICI3 revascularization of the occluded basilar artery and was subsequently admitted to the neuro intensive care unit for close monitoring and further treatment. The patient had a long and complicated hospital course. Palliative care was discussed during several family meetings; however, the family made a decision for home care. Appropriate equipment was obtained for the patient and the family was instructed in the patient's care. She was eventually discharged in stable condition on 01/31/2017. Significant issues from the patient's hospital course are outlined below.  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 Infarct embolic likely secondary to hypercoagulable state from metastatic colon Franklin.  Resultant unresponsive, comatose state  CT head subacute infarct R parietal lobe, no frank hematoma    MRI  extensive multi vascular supra and infratentorial infarcts. Right petechial hemorrhage.  MRA  basilar artery and R PCA occlusion, I suspect this is chronic changes due to lack of brainstem and PCA ischemia.   Cerebral angiogram TICI 3 revascularization of occluded basilar artery  2D Echo  EF 60-65%   Carotid Doppler unremarkable   LE venous doppler  - right popliteal vein DVT -> therapeutic dose Lovenox.  EEG diffuse slowing with superimposed left centrotemporal slowing   LDL 110  HgbA1c - 7.2  Diet NPO time specified  No antithrombotic prior to admission - she was placed on aspirin which was later changed to Lovenox secondary to her DVT.  Neuro prognosis poor in April Franklin. Multiple family meetings were held. Dr. Pearlean Brownie met with the family several times on the day of discharge as well. All questions were answered. Issues were addressed.   Acute respiratory failure   The patient was initially intubated for airway protection.  She was followed in the neuro intensive care unit by the critical care medicine team.  On 01/16/2017 percutaneous tracheostomy was performed by Dr. Tyson Alias.  Eventually the patient improved to the point that she was taken off the ventilator.  Home oxygen was considered at time of discharge; however, the patient did not meet criteria for supplemental oxygen.  Trach changed to #6 cuffless  The family was instructed on suctioning the patient's excess secretions prior to discharge.  Right LE DVT - likely due to hypercoagulable state secondary to metastatic colon Franklin  Not candidate for anticoagulation due to large infarct with petechial hemorrhage  Discussed with family regarding no aggressive treatment vs. IVC filter. Palliative care was discussed; however, the patient's family elected for home care.  The DVT was treated with full dose Lovenox  60 mgs every 12 hours. The patient's family was instructed regarding  administration for home use.  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  Her home blood pressure medications were resumed.   Hyperlipidemia  Home meds:  No statin  LDL 110, goal < 70  No statin due to elevated LFTs and liver metastasis   Nutrition  Initially the patient was NPO. She was eventually progressed to a diet although she was unable to take in enough calories to meet her needs.  A PEG feeding tube was placed. There were some problems with leakage at the insertion site but this was addressed by the surgeons.  The patient did go home on continuous tube feedings.   Diabetes  HgbA1c - 7.2  goal < 7.0  Consultation from the diabetic nurse specialist was requested. She made recommendations regarding the patient's tube feedings and insulin coverage. She recommended discontinuing all oral diabetic medications.   Other Active Problems  Elevated troponins - trended down, likely demand ischemia  Arthritis  Anemia - hemoglobin stable 8.8  Hypomagnesemia, resolved  Hypokalemia - treated and improved  Fever - believed secondary to aspiration pneumonia - treated with IV Unasyn. Afebrile at time of discharge with normal white blood cell count.  DISCHARGE EXAM Blood pressure (!) 145/84, pulse 95, temperature 99.5 F (37.5 C), temperature source Axillary, resp. rate 15, height _0  (1.575 m), weight 63.2 kg (139 lb 5.3 oz), SpO2 93 %. General - cachectic, on trach collar, mild respiratory distress due to secretions  Ophthalmologic - Fundi not visualized.  Cardiovascular - Regular rate and  rhythm.  Abdomen: mildly tender, non distended, +BS  Neuro - on trach collar, wsy but open eyes slightly droto voice, left gaze deviation, sluggish doll's eyes, not tracking objects. Not following commands. does blink to threat, pupil 2.5 mm bilaterally, reactive to light. Left facial droop when gimaces to pain.    Withdraw to pain stimulation in all 4 extremities, L>R.with mild generalized weakness. DTR diminished, no babinski. Sensation, coordination and gait not tested.  Discharge Diet     liquids  DISCHARGE PLAN  Disposition:  Home with family  [facility] for HH PT, OT, ST, RN, NA  and SW as well as DME (bed, w/c, trach supplies, tube feedings)  full dose lovenox for secondary stroke prevention given acute DVT and Franklin diagnosis  Ongoing risk factor control by Primary Care Physician at time of discharge  Follow-up HUSAIN,KARRAR, MD in 2 weeks. Will need labs drawn next visit.  Follow-up with Dr. Antony Contras, Stroke Clinic in 6 weeks, office to schedule an appointment.  45 minutes were spent preparing discharge.  Mikey Bussing PA-C [facility] Pager [phone] 02/01/2017, 2:58 PM I have personally examined this patient, reviewed notes, independently viewed imaging studies, participated in medical decision making and plan of care.ROS completed by me personally and pertinent positives fully documented  I have made any additions or clarifications directly to the above note. Agree with note above.   Antony Contras, MD Medical Director [facility] Pager: [phone] 02/03/2017 8:19 AM

## 2017-01-29 NOTE — Progress Notes (Signed)
Multiple family members in patient's room. They voiced several concerns regarding patient's G tube. They're concerned about the macerated/raw skin surrounding the site - WOC consult placed for skin protectant recommendations. They voiced they would like the MD to consider removing the G tube prior to d/c - I explained the importance of her needing adequate nutrition to survive. Patient has only consumed 3 bites each of lunch and supper; I explained to them that this is not adequate enough for the MD to consider removal of the tube at this time. They continued to explain they were very nervous about the continued leaking around the G tube site and stated "We're just amateurs, we feel like that's too much for [place] to deal with once we get her home. It needs to be fixed before we leave." Dr. Hulen Skains had been notified earlier today about the leaking.   I also spent time discussing trach care with the family and explaining the importance of why it needs to be done. This was not demonstrated at this time, but simply discussed with everyone at the bedside. I explained the importance of suctioning (if necessary), removing/cleaning/replacing the inner cannula, and how a mucous plug could form and cause difficulty breathing. Family members seem very anxious about all of the above and seem uncomfortable/worried about the possibility of discharge tomorrow.   Joellen Jersey, RN.

## 2017-01-29 NOTE — Progress Notes (Signed)
While administering AM medications through G tube RN noticed staining on the dressing. After removal of the dressing, there was a moderate amount of thick, tan, sour smelling drainage around the site. Skin appeared very red, raw, and macerated. Area cleaned and dried. G tube flushed without difficulty and no immediate leaking noted at the site. New dressing applied and reinforced. Dr. Hulen Skains notified - stated to continue tube feeds and he will come up to assess it.  Daughter at bedside and aware of all the above. Attempted to educate the daughter about flushing the tube, administering medications, and changing the dressing but she appeared very nervous/anxious about the drainage and proceeded to make a phone call. She seemed very hesitant about learning.   Joellen Jersey, RN.

## 2017-01-29 NOTE — Progress Notes (Signed)
STROKE TEAM PROGRESS NOTE   SUBJECTIVE (INTERVAL HISTORY) Currently undergoing FEES with ST. They do see a lot of edema, final test result pending. Lots of family at the bedside.,    OBJECTIVE Temp:  [97.8 F (36.6 C)-98.8 F (37.1 C)] 98.1 F (36.7 C) (03/07 0816) Pulse Rate:  [65-90] 87 (03/07 0816) Cardiac Rhythm: Normal sinus rhythm (03/07 0816) Resp:  [14-24] 19 (03/07 0816) BP: (142-161)/(64-70) 149/64 (03/07 0816) SpO2:  [95 %-100 %] 100 % (03/07 0816) FiO2 (%):  [28 %] 28 % (03/07 0816) Weight:  [62.8 kg (138 lb 7.2 oz)] 62.8 kg (138 lb 7.2 oz) (03/07 0428)  CBC:   Recent Labs Lab 01/25/17 0328 01/26/17 0245  WBC 9.5 9.1  HGB 7.3* 8.4*  HCT 23.5* 27.1*  MCV 89.4 88.0  PLT 317 024    Basic Metabolic Panel:   Recent Labs Lab 01/28/17 0312 01/29/17 0245  NA 146* 145  K 2.8* 3.5  CL 107 106  CO2 29 25  GLUCOSE 135* 218*  BUN <5* <5*  CREATININE 0.55 0.65  CALCIUM 9.9 9.8  MG  --  1.6*    PHYSICAL EXAM  General - cachectic, on trach collar, mild respiratory distress due to secretions  Ophthalmologic - Fundi not visualized.  Cardiovascular - Regular rate and rhythm.  Abdomen: mildly tender, non distended, +BS  Neuro - on trach collar, open eyes to voice, left gaze deviation, sluggish doll's eyes, not tracking objects. Not following commands. Did not blink to threat, pupil 2.5 mm bilaterally, reactive to light. Left facial droop when gimaces to pain.  Frown bilaterally. Withdraw to pain stimulation in all 4 extremities, L>R. DTR diminished, no babinski. Sensation, coordination and gait not tested.   ASSESSMENT/PLAN Ms. MARLY SCHULD is a 76 y.o. female with history of colon cancer, metastatic pancreatobiliary adenocarcinoma, HTN, HLD and DM presenting with progressive altered mental state, HA and double vision. She did not receive IV t-PA due to delay in arrival.   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 Infarct embolic likely secondary to hypercoagulable state from metastatic colon cancer.  Resultant not open eyes, not following commands  CT head subacute infarct R parietal lobe, no frank hematoma   MRI  extensive multi vascular supra and infratentorial infarcts. Right petechial hemorrhage.  MRA  basilar artery and R PCA occlusion, I suspect this is chronic changes due to lack of brainstem and PCA ischemia.   Cerebral angiogram TICI 3 revascularization of occluded basilar artery  2D Echo  EF 60-65%   Carotid Doppler unremarkable   LE venous doppler  - right popliteal vein DVT  EEG diffuse slowing with superimposed left centrotemporal slowing   LDL 110  HgbA1c - 7.2  Lovenox  for VTE prophylaxis Diet NPO time specified  No antithrombotic prior to admission, now on lovenox therapeutic dose.   Therapy recommendations:  SNF. Family wants to take her home - [facility] OT w/ [place] aide, 3N1, w/c, w/c cushion, hospital bed with mattress overlay, hoyer lift  Disposition:  Pending  Hold transfer to floor give PEG issues and low K  Acute respiratory failure secondary to stroke   Unable to extubated d/t comatose state and brainstem stroke  trach placement 01/16/17  On trach collar with FiO2 28%  Trach changed to #6 cuffless  Right LE DVT - likely due to hypercoagulable state secondary to metastatic colon cancer  No IVC filter placed   on lovenox therapeutic dose   Fever - resolved  Mild leukocytosis. 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 Tube insertion site leaking 3/3. Replaced by trauma 3/5. 3/6 G-tube balloon ruptured w/ tube replaced by trauma. They will check to see if leaking  C diff negative  Goal rate @ 60cc/h  (off d/d tube coming out this am)  On free water 40cc/h  ST on board - using Passe Muir  FEES today - results pending  Hypertension  SBP goal normotensive  Resume home meds - losartan increase to 50mg  bid  BP stable  Hyperlipidemia  Home meds:  No statin  LDL 110, goal < 70  No statin due to elevated LFTs and liver metastasis  Diabetes  HgbA1c - 7.2  goal < 7.0  SSI  CBG monitoring - CBGs up back on TF  Will  resume lantus   Ask diabetic nurse to help with plans for discharge home tomorrow related to home meds  home meds - metformin, glipizide, tradjenta - resume once TF starts and based on CBG monitoring  Anemia   hemoglobin stable 8.4  Close monitor  Other Stroke Risk Factors  Advanced age  Other Active Problems  Elevated troponins - trended down, likely demand ischemia  Hypokalemia, resolved - 2.8->3.5 today  Family request chang in preferred pharmacy to [facility].  Hospital day # [place] for Pager information 01/29/2017 9:28 AM  I have personally examined this patient, reviewed notes, independently viewed imaging studies, participated in medical decision making and plan of care.ROS completed by me personally and pertinent positives fully documented  I have made any additions or clarifications directly to the above note. Agree with note above. Recommend conservative diabetes nurse to help educate the patient's family members about insulin usage. If patient continues to tolerate PEG tube feeds will consider discharge tomorrow if medically  stable. Discussed with multiple family members and they voice understanding with the above plan. Greater than 50% time during this 25 minute visit was spent on counseling the family about the patient's nursing in medical care as well as coordination of her care and answering questions  Antony Contras, MD Medical Director [place] Pager: [phone] 01/29/2017 1:18 PM   To contact Stroke Continuity provider, please refer to [email or web address] After hours, contact General Neurology

## 2017-01-29 NOTE — Progress Notes (Signed)
[facility] transport paperwork completed and placed on shadow chart. Jonnie Finner RN CCM Case Mgmt phone [phone]

## 2017-01-29 NOTE — Procedures (Signed)
Objective Swallowing Evaluation: Type of Study: FEES-Fiberoptic Endoscopic Evaluation of Swallow  Patient Details  Name: April Franklin MRN: [id number] Date of Birth: 08/25/1941  Today's Date: 01/29/2017 Time: SLP Start Time (ACUTE ONLY): 0900-SLP Stop Time (ACUTE ONLY): 1000 SLP Time Calculation (min) (ACUTE ONLY): 60 min  Past Medical History:  Past Medical History:  Diagnosis Date  . Arthritis   . Colon cancer ([place])   . Diabetes mellitus   . Hyperlipidemia   . Hypertension   . UTI (urinary tract infection)    Past Surgical History:  Past Surgical History:  Procedure Laterality Date  . ABDOMINAL HYSTERECTOMY    . APPENDECTOMY    . CATARACT EXTRACTION    . COLON RESECTION    . COLONOSCOPY    . ESOPHAGOGASTRODUODENOSCOPY N/A 01/16/2017   Procedure: ESOPHAGOGASTRODUODENOSCOPY (EGD);  Surgeon: Georganna Skeans, MD;  Location: [facility];  Service: General;  Laterality: N/A;  bedside  . FLEXIBLE SIGMOIDOSCOPY N/A 10/10/2015   Procedure: FLEXIBLE SIGMOIDOSCOPY;  Surgeon: Garlan Fair, MD;  Location: [facility];  Service: Endoscopy;  Laterality: N/A;  UNSEDATED  . IR GENERIC HISTORICAL  01/10/2017   IR PERCUTANEOUS ART THROMBECTOMY/INFUSION INTRACRANIAL INC DIAG ANGIO 01/10/2017 Luanne Bras, MD [facility]  . IR GENERIC HISTORICAL  01/10/2017   IR ANGIO VERTEBRAL SEL SUBCLAVIAN INNOMINATE UNI L MOD SED 01/10/2017 Luanne Bras, MD [facility]  . LEG SURGERY    . PEG PLACEMENT N/A 01/16/2017   Procedure: PERCUTANEOUS ENDOSCOPIC GASTROSTOMY (PEG) PLACEMENT;  Surgeon: Georganna Skeans, MD;  Location: [facility];  Service: General;  Laterality: N/A;  . RADIOLOGY WITH ANESTHESIA N/A 01/10/2017   Procedure: RADIOLOGY WITH ANESTHESIA;  Surgeon: Medication Radiologist, MD;  Location: [facility];  Service: Radiology;  Laterality: N/A;   HPI: Pt is a 76 y/o female with DM, HTN, and colon cancer s/p resection 2009. She has a more recent finding of multiple liver and pancreatic lesions  s/p liver biopsy on 12/30/16 which showed new diagnosis of metastatic pancreatobilary adenocarcinoma. She was admitted on 2/15 with altered mental status and found to have multiple cerebral infarcts, basilar occusion on MR:  Patchy reduced diffusion RIGHT greater than LEFT (multivascular territory) cerebellum. Scattered subcentimeter foci of reduced diffusion midbrain, LEFT temporal lobe and RIGHT basal ganglia. Confluent RIGHT greater than LEFT occipital lobe reduced diffusion and, RIGHT mesial temporal lobe. Patchy reduced diffusion predominately within white matter bilateral frontoparietal lobes. Petechial hemorrhage RIGHT parietal and occipital lobes, to lesser extent RIGHT cerebellum. Pt was intubated from 2/16 to 2/22, trach placed, cuffless 6 trach placed 3/1.   Subjective: answering questions; family present   Assessment / Plan / Recommendation  CHL IP CLINICAL IMPRESSIONS 01/29/2017  Clinical Impression Pt presents with a primary oral dysphagia, with prolonged oral preparation and intermittent oral holding, requiring cues to sustain attention and swallow.  Larynx is c/b diffuse edema; posterior commissure hypertrophy, but no erythema and adequate vocal fold closure.  Pt's pharyngeal swallow was intact, with adequate pharyngeal clearance of POs/no residue post-swallow; no penetration nor aspiration, despite large, consecutive swallows of thin liquids.  Recommend allowing dysphagia 3 diet; thin liquids (straws are safe).  Pt must use PMV for all PO intake.  Primary barrier to successful oral alimentation will be cognitive status and endurance.  SLP will follow pending D/C.   Session also focused on toleration of PMV and family education.  Pt used valve for 60 minute session with adequate VS, no change in Sp02 or HR, RR.  Spontaneous communication is limited, requiring max  verbal cues to phonate.  Pt achieved low volume, mildly hoarse phonation when sustaining vowel sounds.  Educated husband/daughters re:  use of valve, removal for sleep, necessity of use with eating/drinking, and cleaning.  They verbalized understanding.   SLP Visit Diagnosis Dysphagia, oral phase (R13.11)  Attention and concentration deficit following --  Frontal lobe and executive function deficit following --  Impact on safety and function --      CHL IP TREATMENT RECOMMENDATION 01/29/2017  Treatment Recommendations Therapy as outlined in treatment plan below     Prognosis 01/29/2017  Prognosis for Safe Diet Advancement Good  Barriers to Reach Goals --  Barriers/Prognosis Comment --    CHL IP DIET RECOMMENDATION 01/29/2017  SLP Diet Recommendations Dysphagia 3 (Mech soft) solids;Thin liquid  Liquid Administration via Cup;Straw  Medication Administration Crushed with puree  Compensations Slow rate;Other (Comment)  Postural Changes --      CHL IP OTHER RECOMMENDATIONS 01/29/2017  Recommended Consults --  Oral Care Recommendations Oral care BID  Other Recommendations Place PMSV during PO intake      CHL IP FOLLOW UP RECOMMENDATIONS 01/29/2017  Follow up Recommendations Home health SLP      CHL IP FREQUENCY AND DURATION 01/29/2017  Speech Therapy Frequency (ACUTE ONLY) min 2x/week  Treatment Duration 2 weeks           CHL IP ORAL PHASE 01/29/2017  Oral Phase Impaired  Oral - Pudding Teaspoon --  Oral - Pudding Cup --  Oral - Honey Teaspoon --  Oral - Honey Cup --  Oral - Nectar Teaspoon --  Oral - Nectar Cup --  Oral - Nectar Straw --  Oral - Thin Teaspoon --  Oral - Thin Cup --  Oral - Thin Straw --  Oral - Puree Weak lingual manipulation;Delayed oral transit  Oral - Mech Soft --  Oral - Regular Weak lingual manipulation;Delayed oral transit  Oral - Multi-Consistency --  Oral - Pill --  Oral Phase - Comment intermittent oral holding of POs, requiring cues to swallow    CHL IP PHARYNGEAL PHASE 01/29/2017  Pharyngeal Phase WFL  Pharyngeal- Pudding Teaspoon --  Pharyngeal --  Pharyngeal- Pudding Cup --   Pharyngeal --  Pharyngeal- Honey Teaspoon --  Pharyngeal --  Pharyngeal- Honey Cup --  Pharyngeal --  Pharyngeal- Nectar Teaspoon --  Pharyngeal --  Pharyngeal- Nectar Cup --  Pharyngeal --  Pharyngeal- Nectar Straw --  Pharyngeal --  Pharyngeal- Thin Teaspoon --  Pharyngeal --  Pharyngeal- Thin Cup --  Pharyngeal --  Pharyngeal- Thin Straw --  Pharyngeal --  Pharyngeal- Puree --  Pharyngeal --  Pharyngeal- Mechanical Soft --  Pharyngeal --  Pharyngeal- Regular --  Pharyngeal --  Pharyngeal- Multi-consistency --  Pharyngeal --  Pharyngeal- Pill --  Pharyngeal --  Pharyngeal Comment --     No flowsheet data found.  No flowsheet data found.  Juan Quam Laurice 01/29/2017, 11:45 AM

## 2017-01-29 NOTE — Progress Notes (Signed)
S/W Cameron Katayama  @ [facility] # [phone]   LOVENOX 60 MG -NO   1. LOVENOX 80 MG BID  COVER- NO  PRIOR APPROVAL- YES # [id number] FOR EXCEPTION   2. ENOXAPARIN 80 MG  COVER- YES  CO-PAY- $ 5.90  TIER- 2 DRUG  PRIOR APPROVAL- NO   PREFERRED PHARMACY : [facility]

## 2017-01-30 LAB — GLUCOSE, CAPILLARY
GLUCOSE-CAPILLARY: 246 mg/dL — AB (ref 65–99)
Glucose-Capillary: 368 mg/dL — ABNORMAL HIGH (ref 65–99)
Glucose-Capillary: 324 mg/dL — ABNORMAL HIGH (ref 65–99)
Glucose-Capillary: 233 mg/dL — ABNORMAL HIGH (ref 65–99)
Glucose-Capillary: 231 mg/dL — ABNORMAL HIGH (ref 65–99)

## 2017-01-30 MED ORDER — INSULIN GLARGINE 100 UNIT/ML ~~LOC~~ SOLN
15.0000 [IU] | Freq: Every day | SUBCUTANEOUS | Status: DC
  Administered 2017-01-30: 15 [IU] via SUBCUTANEOUS
  Filled 2017-01-30 (×2): qty 0.15

## 2017-01-30 MED ORDER — INSULIN ASPART 100 UNIT/ML ~~LOC~~ SOLN
5.0000 [IU] | SUBCUTANEOUS | Status: DC
  Administered 2017-01-30 – 2017-01-31 (×6): 5 [IU] via SUBCUTANEOUS

## 2017-01-30 NOTE — Progress Notes (Signed)
Informed Dr. Grandville Silos with surgery/trauma about the thick yellow/green drainage noted from around G tube site. He stated they would come up later today to assess it.  Joellen Jersey, RN.

## 2017-01-30 NOTE — Progress Notes (Signed)
Patient's daughter Rochell administered lunch time insulin injection.  Joellen Jersey, RN.

## 2017-01-30 NOTE — Care Management Important Message (Signed)
Important Message  Patient Details  Name: April Franklin MRN: [id number] Date of Birth: [birth year]   Medicare Important Message Given:  Yes    Nathen May 01/30/2017, 3:23 PM

## 2017-01-30 NOTE — Progress Notes (Addendum)
Inpatient Diabetes Program Recommendations  AACE/ADA: New Consensus Statement on Inpatient Glycemic Control (2015)  Target Ranges:  Prepandial:   less than 140 mg/dL      Peak postprandial:   less than 180 mg/dL (1-2 hours)      Critically ill patients:  140 - 180 mg/dL   Results for April Franklin, April Franklin (MRN [id number]) as of 01/30/2017 09:34  Ref. Range 01/29/2017 04:03 01/29/2017 08:14 01/29/2017 12:37 01/29/2017 16:54 01/29/2017 20:14 01/29/2017 23:42 01/30/2017 04:54 01/30/2017 07:24  Glucose-Capillary Latest Ref Range: 65 - 99 mg/dL 223 (H) 184 (H) 267 (H) 257 (H) 254 (H) 196 (H) 368 (H) 324 (H)   Review of Glycemic Control  Diabetes history: DM2 Outpatient Diabetes medications: Glipizide 10 mg Daily, Tresiba 20 units QHS, Tradjenta 5 mg Daily, Metformin 1000 mg Daily Current orders for Inpatient glycemic control: Lantus 5 units QHS, Novolog 0-15 units Q4H  Inpatient Diabetes Program Recommendations: Insulin - Basal: Please consider increasing Lantus to 15 units QHS. Insulin - Meal Coverage: Please consider ordering Novolog 5 units Q4H for tube feeding coverage (should not be given if TF is stopped or held). Discharge Recommendations for DM control: Recommend discharging patient on Lantus 15 units QHS, Novolog 0-15 units Q4H for correction (same scale and currently ordered), and Novolog 5 units Q4H for tube feeding coverage (if patient will continue on current tube feeding rate of 60 ml/hr; however, if family will provide bolus tube feedings then recommend Novolog 5 units with each tube feeding bolus). Do NOT recommend continuing oral DM medications patient was taking prior to admission. Advise patient to contact her PCP if glucose is less than 70 mg/dl or consistently greater than 180 mg/dl for further insulin dosage adjustments. Please provide Rx for insulin pens and insulin pen needles.  Thanks, Barnie Alderman, RN, MSN, CDE Diabetes Coordinator Inpatient Diabetes Program [phone] (Team Pager from 8am to  5pm)

## 2017-01-30 NOTE — Progress Notes (Signed)
Patient ID: April Franklin, female   DOB: 1941/05/23, 77 y.o.   MRN: [id number] Asked to check foley G tube. Site has a small amount of brown fluid leaking out next to tube. No abscess or cellulitis. Skin with mild irritation but barrier cream on now. This should improve. For now, continue barrier cream and change dressing sponge BID. I spoke with her family.  Georganna Skeans, MD, MPH, FACS Trauma: [phone] General Surgery: [phone]

## 2017-01-30 NOTE — Progress Notes (Signed)
Patient's youngest daughter at bedside, along with other daughter Rochell and patient's grandson. I informed patient's youngest daughter it was her turn to administer the SQ insulin injection and she stated "I feel like momma doesn't want me to do it, I'll wait until later." She continued to refuse after much encouragement. Rochell agreed to administer the injection and did so independently.   Joellen Jersey, RN.

## 2017-01-30 NOTE — Progress Notes (Signed)
STROKE TEAM PROGRESS NOTE   SUBJECTIVE (INTERVAL HISTORY) Daughter at bedside. She is concerned about erythema around peg tube site.family has been instructed but have not yet been administering her medicines, and tube feeds OBJECTIVE Temp:  [97.3 F (36.3 C)-99.1 F (37.3 C)] 97.3 F (36.3 C) (03/08 1216) Pulse Rate:  [76-94] 86 (03/08 1216) Cardiac Rhythm: Normal sinus rhythm (03/08 1216) Resp:  [16-30] 16 (03/08 1216) BP: (132-147)/(54-70) 146/70 (03/08 1216) SpO2:  [90 %-96 %] 94 % (03/08 1222) FiO2 (%):  [21 %] 21 % (03/08 1216) Weight:  [139 lb 8.8 oz (63.3 kg)] 139 lb 8.8 oz (63.3 kg) (03/08 0455)  CBC:   Recent Labs Lab 01/25/17 0328 01/26/17 0245  WBC 9.5 9.1  HGB 7.3* 8.4*  HCT 23.5* 27.1*  MCV 89.4 88.0  PLT 317 765    Basic Metabolic Panel:   Recent Labs Lab 01/28/17 0312 01/29/17 0245 01/29/17 0902  NA 146* 145  --   K 2.8* 3.5  --   CL 107 106  --   CO2 29 25  --   GLUCOSE 135* 218*  --   BUN <5* <5*  --   CREATININE 0.55 0.65  --   CALCIUM 9.9 9.8  --   MG  --  1.6* 2.0    PHYSICAL EXAM  General - cachectic, on trach collar, mild respiratory distress due to secretions  Ophthalmologic - Fundi not visualized.  Cardiovascular - Regular rate and rhythm.  Abdomen: mildly tender, non distended, +BS  Neuro - on trach collar, open eyes to voice, left gaze deviation, sluggish doll's eyes, not tracking objects. Not following commands. does blink to threat, pupil 2.5 mm bilaterally, reactive to light. Left facial droop when gimaces to pain.    Withdraw to pain stimulation in all 4 extremities, L>R.with mild generalized weakness. DTR diminished, no babinski. Sensation, coordination and gait not tested.   ASSESSMENT/PLAN April Franklin is a 76 y.o. female with history of colon cancer, metastatic pancreatobiliary adenocarcinoma, HTN, HLD and DM presenting with progressive altered mental state, HA and double vision. She did not receive IV t-PA due to  delay in arrival.   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 Infarct embolic likely secondary to hypercoagulable state from metastatic colon cancer.  Resultant not open eyes, not following commands  CT head subacute infarct R parietal lobe, no frank hematoma   MRI  extensive multi vascular supra and infratentorial infarcts. Right petechial hemorrhage.  MRA  basilar artery and R PCA occlusion, I suspect this is chronic changes due to lack of brainstem and PCA ischemia.   Cerebral angiogram TICI 3 revascularization of occluded basilar artery  2D Echo  EF 60-65%   Carotid Doppler unremarkable   LE venous doppler  - right popliteal vein DVT  EEG diffuse slowing with superimposed left centrotemporal slowing   LDL 110  HgbA1c - 7.2  Lovenox  for VTE prophylaxis DIET DYS 3 Room service appropriate? Yes; Fluid consistency: Thin  No antithrombotic prior to admission, now on lovenox therapeutic dose.   Therapy recommendations:  SNF. Family wants to take her home - [facility] OT w/ [place] aide, 3N1, w/c, w/c cushion, hospital bed with mattress overlay, hoyer lift  Disposition:  Pending  Hold transfer to floor give PEG issues and low K  Acute respiratory failure secondary to stroke   Unable to extubated d/t comatose state  and brainstem stroke  trach placement 01/16/17  On trach collar with FiO2 28%  Trach changed to #6 cuffless  Right LE DVT - likely due to hypercoagulable state secondary to metastatic colon cancer  No IVC filter placed   on lovenox therapeutic dose   Fever - resolved  Mild leukocytosis. 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 Tube insertion site leaking 3/3. Replaced by trauma 3/5. 3/6 G-tube balloon ruptured w/ tube replaced by trauma. They will check to see if leaking  C diff negative  Goal rate @ 60cc/h  (off d/d tube coming out this am)  On free water 40cc/h  ST on board - using Passe Muir  FEES today - results pending  Hypertension  SBP goal normotensive  Resume home meds - losartan increase to 50mg  bid  BP stable  Hyperlipidemia  Home meds:  No statin  LDL 110, goal < 70  No statin due to elevated LFTs and liver metastasis  Diabetes  HgbA1c - 7.2  goal < 7.0  SSI  CBG monitoring - CBGs up back on TF  Will  increase lantus to 15 units q hs  Appreciate diabetic nurse  help with plans for discharge home tomorrow related to home meds  home meds - metformin, glipizide, tradjenta - resume once TF starts and based on CBG monitoring  Anemia   hemoglobin stable 8.4  Close monitor  Other Stroke Risk Factors  Advanced age  Other Active Problems  Elevated troponins - trended down, likely demand ischemia  Hypokalemia, resolved - 2.8->3.5 today     Hospital day # 21  I have personally examined this patient, reviewed notes, independently viewed imaging studies, participated in medical decision making and plan of care.ROS completed by me personally and pertinent positives fully documented  I have made any additions or clarifications directly to the above note.  . Consult wound nurse to help with peg tube site care and educate family on this. I have asked family member to get trained in administering patients medicines and peg tube feeding today in anticipation of discharge home  tomorrow if all is well.. I  Discussed with daughter at bedside and over phone . Greater than 50% time during this 25 minute visit was spent on counseling the family about the patient's nursing in medical care as well as coordination of her care and answering questions  Antony Contras, MD Medical Director [place] Pager: [phone] 01/30/2017 1:39 PM   To contact Stroke Continuity provider, please refer to [email or web address] After hours, contact General Neurology

## 2017-01-30 NOTE — Progress Notes (Signed)
Patient possibly being discharged to home with family today. Insulin education was ordered. Patient's daughter Zigmund Daniel at bedside this morning - RN informed her we would be administering an insulin injection before breakfast and she stated "Oh, my sister in law knows how to do that." When I questioned if the sister in law would be living with the patient, she stated "No, but she comes and goes frequently." I attempted again to get the daughter to administer the injection and she stated she had done it before and would be able to do it if necessary; declined need for education.   Per the family, respiratory would be coming to do trach care education around 0800 this morning. Multiple family members have arrived for the teaching. Confirmed this with pulmonary function team; they stated they were aware and would be coming up soon.   Joellen Jersey, RN.

## 2017-01-30 NOTE — Progress Notes (Signed)
Patient's daughter Virgilio Belling and patient's grandson at bedside during AM med pass. I informed her that she would be doing the med pass while I observed. She was able to pause tube feeds, clamp G tube, and administer crushed and liquid medications. She required coaching with the first med, but was able to complete the remaining 2 meds and flushes and resume tube feeds independently. She also administered SQ Lovenox injection with RN walking her through the process. She appeared nervous, and stated she was quite nervous about it, but did it correctly and successfully. Patient's grandson took pictures throughout the process for them to use as reminders/cues. Will continue to educate family.  Joellen Jersey, RN.

## 2017-01-30 NOTE — Progress Notes (Signed)
  Speech Language Pathology Treatment: Dysphagia;Passy Muir Speaking valve  Patient Details Name: April Franklin MRN: [id number] DOB: [birth year] Today's Date: 01/30/2017 Time: 5449-2010 SLP Time Calculation (min) (ACUTE ONLY): 17 min  Assessment / Plan / Recommendation Clinical Impression  F/u after yesterday's FEES for further education.  One of pt's daughters at bedside, who is present for training.  Reviewed PMV video re: function/purpose and precautions; dtr able to place/remove valve independently.  Reviewed results from FEES and precautions.  Pt consumed limited thin liquids with + toleration, however, she was lethargic and required mod cues to awaken and participate.  Discussed necessity of family f/u with [facility] re: balance of tube feeds vs oral intake.  SLP will continue to follow pending D/C.   HPI HPI: Pt is a 76 y/o female with DM, HTN, and colon cancer s/p resection 2009. She has a more recent finding of multiple liver and pancreatic lesions s/p liver biopsy on 12/30/16 which showed new diagnosis of metastatic pancreatobilary adenocarcinoma. She was admitted on 2/15 with altered mental status and found to have multiple cerebral infarcts, basilar occusion on MR:  Patchy reduced diffusion RIGHT greater than LEFT (multivascular territory) cerebellum. Scattered subcentimeter foci of reduced diffusion midbrain, LEFT temporal lobe and RIGHT basal ganglia. Confluent RIGHT greater than LEFT occipital lobe reduced diffusion and, RIGHT mesial temporal lobe. Patchy reduced diffusion predominately within white matter bilateral frontoparietal lobes. Petechial hemorrhage RIGHT parietal and occipital lobes, to lesser extent RIGHT cerebellum. Pt was intubated from 2/16 to 2/22, trach placed, cuffless 6 trach placed 3/1.       SLP Plan  Continue with current plan of care       Recommendations  Diet recommendations: Dysphagia 3 (mechanical soft);Thin liquid Liquids provided via:  Cup;Straw Medication Administration: Via alternative means Compensations: Slow rate;Other (Comment) Postural Changes and/or Swallow Maneuvers:  (use PMV with all POs)      PMSV Supervision: Full         Oral Care Recommendations: Oral care QID Follow up Recommendations: Home health SLP SLP Visit Diagnosis: Dysphagia, oral phase (R13.11) Plan: Continue with current plan of care       GO                Juan Quam Laurice 01/30/2017, 11:56 AM

## 2017-01-30 NOTE — Progress Notes (Signed)
Changed PT water bottle and tubing

## 2017-01-30 NOTE — Consult Note (Addendum)
[place] Nurse wound consult note Reason for Consult: PEG tube peristomal skin irritation Wound type: Moisture associated skin damage related to PEG drainage Pressure Injury POA: No Measurement: 3-4 cm skin irritation circumferentially, with denudation noted from 7-10 o'clock  Wound bed: pink, moist, partial thickness Drainage (amount, consistency, odor) thick yellow/green drainage noted at tube exit site, MD aware Periwound: intact  Dressing procedure/placement/frequency: Add Calmoseptine (zinc and calamine barrier ointment). Instructions for family for application and use.  Where to obtain OTC.  Samples provided.  Patient plans for DC to home tomorrow.  Will leave some in patient's room for use until patient DC to home.   Discussed POC with patient's caregiver and bedside nurse.  Re consult if needed, will not follow at this time. Thanks  Hulbert Branscome [employer], RN,CWOCN, CNS [phone])

## 2017-01-31 ENCOUNTER — Inpatient Hospital Stay (HOSPITAL_COMMUNITY): Payer: Medicare Other

## 2017-01-31 DIAGNOSIS — C7889 Secondary malignant neoplasm of other digestive organs: Secondary | ICD-10-CM | POA: Diagnosis not present

## 2017-01-31 DIAGNOSIS — J9600 Acute respiratory failure, unspecified whether with hypoxia or hypercapnia: Secondary | ICD-10-CM | POA: Diagnosis not present

## 2017-01-31 DIAGNOSIS — I69998 Other sequelae following unspecified cerebrovascular disease: Secondary | ICD-10-CM | POA: Diagnosis not present

## 2017-01-31 DIAGNOSIS — R1311 Dysphagia, oral phase: Secondary | ICD-10-CM | POA: Diagnosis not present

## 2017-01-31 DIAGNOSIS — E1165 Type 2 diabetes mellitus with hyperglycemia: Secondary | ICD-10-CM | POA: Diagnosis not present

## 2017-01-31 DIAGNOSIS — I69391 Dysphagia following cerebral infarction: Secondary | ICD-10-CM | POA: Diagnosis not present

## 2017-01-31 LAB — GLUCOSE, CAPILLARY
GLUCOSE-CAPILLARY: 171 mg/dL — AB (ref 65–99)
GLUCOSE-CAPILLARY: 213 mg/dL — AB (ref 65–99)
GLUCOSE-CAPILLARY: 218 mg/dL — AB (ref 65–99)
Glucose-Capillary: 206 mg/dL — ABNORMAL HIGH (ref 65–99)

## 2017-01-31 MED ORDER — INSULIN GLARGINE 100 UNIT/ML ~~LOC~~ SOLN: 20.0000 [IU] | Freq: Every day | SUBCUTANEOUS | 11 refills | Status: DC

## 2017-01-31 MED ORDER — MAGNESIUM HYDROXIDE 400 MG/5ML PO SUSP: 30.0000 mL | Freq: Every day | ORAL | 0 refills | Status: AC | PRN

## 2017-01-31 MED ORDER — INSULIN GLARGINE 100 UNIT/ML ~~LOC~~ SOLN
20.0000 [IU] | Freq: Every day | SUBCUTANEOUS | Status: DC
  Filled 2017-01-31: qty 0.2

## 2017-01-31 MED ORDER — CHLORHEXIDINE GLUCONATE 0.12 % MT SOLN: 15.0000 mL | Freq: Two times a day (BID) | OROMUCOSAL | 0 refills | Status: DC

## 2017-01-31 MED ORDER — SENNOSIDES 8.8 MG/5ML PO SYRP: 5.0000 mL | ORAL_SOLUTION | Freq: Every day | ORAL | 0 refills | Status: DC

## 2017-01-31 MED ORDER — INSULIN ASPART 100 UNIT/ML ~~LOC~~ SOLN: 0.0000 [IU] | SUBCUTANEOUS | 11 refills | Status: DC

## 2017-01-31 MED ORDER — INSULIN ASPART 100 UNIT/ML ~~LOC~~ SOLN: 8.0000 [IU] | SUBCUTANEOUS | Status: DC

## 2017-01-31 MED ORDER — PANTOPRAZOLE SODIUM 40 MG PO PACK: 40.0000 mg | PACK | Freq: Every day | ORAL | 3 refills | Status: DC

## 2017-01-31 MED ORDER — FREE WATER: 240.0000 mL | Freq: Four times a day (QID) | Status: DC

## 2017-01-31 MED ORDER — INSULIN ASPART 100 UNIT/ML ~~LOC~~ SOLN: 8.0000 [IU] | SUBCUTANEOUS | 11 refills | Status: DC

## 2017-01-31 MED ORDER — POTASSIUM CHLORIDE 20 MEQ/15ML (10%) PO SOLN: 30.0000 meq | Freq: Two times a day (BID) | ORAL | 0 refills | Status: DC

## 2017-01-31 MED ORDER — DOCUSATE SODIUM 50 MG/5ML PO LIQD: 50.0000 mg | Freq: Every day | ORAL | 0 refills | Status: AC

## 2017-01-31 MED ORDER — FLEET ENEMA 7-19 GM/118ML RE ENEM: 1.0000 | ENEMA | Freq: Every day | RECTAL | 3 refills | Status: DC | PRN

## 2017-01-31 MED ORDER — INSULIN STARTER KIT- PEN NEEDLES (ENGLISH): 1.0000 | Freq: Once | 0 refills | Status: AC

## 2017-01-31 MED ORDER — ENOXAPARIN SODIUM 60 MG/0.6ML ~~LOC~~ SOLN: 60.0000 mg | Freq: Two times a day (BID) | SUBCUTANEOUS | 3 refills | Status: DC

## 2017-01-31 NOTE — Progress Notes (Signed)
Received orders to d/c patient. Reviewed discharge instructions with daughter and husband. All questions answered verbalized understanding. [facility] called and [facility] notified that patient is in route home. Pt d/c via [facility], all belonging taken with patient.

## 2017-01-31 NOTE — Progress Notes (Signed)
Patient vomited small amount of what appears to be tube feeding consistency.  Dr. Annett Fabian text paged.  Tube feeding placed on hold.  Will continue to monitor.

## 2017-01-31 NOTE — Progress Notes (Signed)
PT Cancellation Note  Patient Details Name: April Franklin MRN: [id number] DOB: Jun 10, 1941   Cancelled Treatment:    Reason Eval/Treat Not Completed: Patient declined, no reason specified. Pt's family declining therapy services at this time as the plan is to d/c home today. PT will continue to f/u with pt as appropriate.   [place] 01/31/2017, 11:27 AM

## 2017-01-31 NOTE — Progress Notes (Signed)
Inpatient Diabetes Program Recommendations  AACE/ADA: New Consensus Statement on Inpatient Glycemic Control (2015)  Target Ranges:  Prepandial:   less than 140 mg/dL      Peak postprandial:   less than 180 mg/dL (1-2 hours)      Critically ill patients:  140 - 180 mg/dL   Results for TONJI, ELLIFF (MRN [id number]) as of 01/31/2017 10:07  Ref. Range 01/30/2017 04:54 01/30/2017 07:24 01/30/2017 12:18 01/30/2017 15:33 01/30/2017 20:04 01/30/2017 23:36 01/31/2017 04:14 01/31/2017 07:54  Glucose-Capillary Latest Ref Range: 65 - 99 mg/dL 368 (H) 324 (H) 246 (H) 233 (H) 231 (H) 213 (H) 218 (H) 206 (H)   Review of Glycemic Control  Diabetes history: DM2 Outpatient Diabetes medications: Glipizide 10 mg Daily, Tresiba 20 units QHS, Tradjenta 5 mg Daily, Metformin 1000 mg Daily Current orders for Inpatient glycemic control: Lantus 15 units QHS, Novolog 0-15 units Q4H, Novolog 5 units Q4H for tube feeding coverage  Inpatient Diabetes Program Recommendations: Insulin - Basal: Please consider increasing Lantus to 20 units QHS. Insulin - Meal Coverage: Please consider increasing tube feeding coverage to Novolog 8 units Q4H for tube feeding coverage (should not be given if TF is stopped or held). Discharge Recommendations for DM control: Recommend discharging patient on Lantus 20 units QHS, Novolog 0-15 units Q4H for correction (same scale and currently ordered), and Novolog 8 units Q4H for tube feeding coverage (if patient will continue on current tube feeding rate of 60 ml/hr; however, if family will provide bolus tube feedings then recommend Novolog 8 units with each tube feeding bolus). Do NOT recommend continuing oral DM medications patient was taking prior to admission. Advise patient's family to contact her PCP if glucose is less than 70 mg/dl or consistently greater than 180 mg/dl for further insulin dosage adjustments. Please provide Rx for insulin pens and insulin pen needles.  Thanks, Barnie Alderman, RN, MSN,  CDE Diabetes Coordinator Inpatient Diabetes Program [phone] (Team Pager from 8am to 5pm)

## 2017-01-31 NOTE — Progress Notes (Signed)
MD and RN at bedside.  Pt getting ready to go home.  Trach care and suctioning performed by daughterNeera Franklin  prior to DC, all questions answered.  Daughter performed adequate trach care and suctioning.  Home health in room now, speaking w/ family re: Resp Therapy home care to meet family when they get home today.  All questions answered, per family they have no further needs/questions for me currently.  VSS, sat 92-93% on room air.

## 2017-01-31 NOTE — Progress Notes (Signed)
Patients daughter voicing concerns over patients mentation daughter states "she is too sleepy."  Pt. Daughter asking for a CT or MRI prior to discharge pt. Daughter states "I want her healthy when she walks out of here."  Dr. [name] text paged regarding pt. Daughters concern.  Will continue to follow up.

## 2017-01-31 NOTE — Progress Notes (Signed)
STROKE TEAM PROGRESS NOTE   SUBJECTIVE (INTERVAL HISTORY) Daughter at bedside. She is concerned about patient being more sleepy today and wants a brain scan.  She has remained afebrile and vitals are stable. She vomitted a small quantity once after potassium being given. OBJECTIVE Temp:  [98.1 F (36.7 C)-100 F (37.8 C)] 99.7 F (37.6 C) (03/09 1445) Pulse Rate:  [85-97] 89 (03/09 1246) Cardiac Rhythm: Normal sinus rhythm (03/09 0830) Resp:  [18-32] 26 (03/09 1246) BP: (131-156)/(76-88) 145/84 (03/09 1246) SpO2:  [92 %-97 %] 94 % (03/09 1246) FiO2 (%):  [21 %] 21 % (03/09 1051) Weight:  [139 lb 5.3 oz (63.2 kg)] 139 lb 5.3 oz (63.2 kg) (03/09 0300)  CBC:   Recent Labs Lab 01/25/17 0328 01/26/17 0245  WBC 9.5 9.1  HGB 7.3* 8.4*  HCT 23.5* 27.1*  MCV 89.4 88.0  PLT 317 191    Basic Metabolic Panel:   Recent Labs Lab 01/28/17 0312 01/29/17 0245 01/29/17 0902  NA 146* 145  --   K 2.8* 3.5  --   CL 107 106  --   CO2 29 25  --   GLUCOSE 135* 218*  --   BUN <5* <5*  --   CREATININE 0.55 0.65  --   CALCIUM 9.9 9.8  --   MG  --  1.6* 2.0    PHYSICAL EXAM  General - cachectic, on trach collar, mild respiratory distress due to secretions  Ophthalmologic - Fundi not visualized.  Cardiovascular - Regular rate and rhythm.  Abdomen: mildly tender, non distended, +BS  Neuro - on trach collar, wsy but open eyes slightly droto voice, left gaze deviation, sluggish doll's eyes, not tracking objects. Not following commands. does blink to threat, pupil 2.5 mm bilaterally, reactive to light. Left facial droop when gimaces to pain.    Withdraw to pain stimulation in all 4 extremities, L>R.with mild generalized weakness. DTR diminished, no babinski. Sensation, coordination and gait not tested.   ASSESSMENT/PLAN [name] is a 76 y.o. female with history of colon cancer, metastatic pancreatobiliary adenocarcinoma, HTN, HLD and DM presenting with progressive altered  mental state, HA and double vision. She did not receive IV t-PA due to delay in arrival.   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 Infarct embolic likely secondary to hypercoagulable state from metastatic colon cancer.  Resultant not open eyes, not following commands  CT head subacute infarct R parietal lobe, no frank hematoma   MRI  extensive multi vascular supra and infratentorial infarcts. Right petechial hemorrhage.  MRA  basilar artery and R PCA occlusion, I suspect this is chronic changes due to lack of brainstem and PCA ischemia.   Cerebral angiogram TICI 3 revascularization of occluded basilar artery  2D Echo  EF 60-65%   Carotid Doppler unremarkable   LE venous doppler  - right popliteal vein DVT  EEG diffuse slowing with superimposed left centrotemporal slowing   LDL 110  HgbA1c - 7.2  Lovenox  for VTE prophylaxis DIET DYS 3 Room service appropriate? Yes; Fluid consistency: Thin  No antithrombotic prior to admission, now on lovenox therapeutic dose.   Therapy recommendations:  SNF. Family wants to take her home - [place] OT w/ [place] aide, 3N1, w/c, w/c cushion, hospital bed with mattress overlay, hoyer lift  Disposition:  Pending  Hold transfer to floor give PEG issues and low K  Acute  respiratory failure secondary to stroke   Unable to extubated d/t comatose state and brainstem stroke  trach placement 01/16/17  On trach collar with FiO2 28%  Trach changed to #6 cuffless  Right LE DVT - likely due to hypercoagulable state secondary to metastatic colon cancer  No IVC filter placed   on lovenox therapeutic dose   Fever - resolved  Mild leukocytosis. 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 Tube insertion site leaking 3/3. Replaced by trauma 3/5. 3/6 G-tube balloon ruptured w/ tube replaced by trauma. They will check to see if leaking  C diff negative  Goal rate @ 60cc/h  (off d/d tube coming out this am)  On free water 40cc/h  ST on board - using Passe Muir  FEES today - results pending  Hypertension  SBP goal normotensive  Resume home meds - losartan increase to 50mg  bid  BP stable  Hyperlipidemia  Home meds:  No statin  LDL 110, goal < 70  No statin due to elevated LFTs and liver metastasis  Diabetes  HgbA1c - 7.2  goal < 7.0  SSI  CBG monitoring - CBGs up back on TF  Will  increase lantus to 15 units q hs  Appreciate diabetic nurse  help with plans for discharge home tomorrow related to home meds  home meds - metformin, glipizide, tradjenta - resume once TF starts and based on CBG monitoring  Anemia   hemoglobin stable 8.4  Close monitor  Other Stroke Risk Factors  Advanced age  Other Active Problems  Elevated troponins - trended down, likely demand ischemia  Hypokalemia, resolved - 2.8->3.5 today     Hospital day # 22  I have personally examined this patient, reviewed notes, independently viewed imaging studies, participated in medical decision making and plan of care.ROS completed by me personally and pertinent positives fully documented  I have made any additions or clarifications directly to the above note.  . Patient appears sleepy today and upon request from daughter CT scan of the head was repeated which did not show any acute findings and showed  expected evolutionary changes in her multiple posterior circulation infarcts. I  discussed with daughter at bedside and over phone .  I also advised the daughter to seek help from patient's primary care physician for management of medical problems when she gets home. She voiced understanding with the plan. Plan to discharge home today  Antony Contras, MD Medical Director [place] Pager: [phone] 01/31/2017 3:10 PM   To contact Stroke Continuity provider, please refer to [email or web address] After hours, contact General Neurology

## 2017-01-31 NOTE — Progress Notes (Addendum)
Nutrition Follow-up  DOCUMENTATION CODES:   Severe malnutrition in context of acute illness/injury  INTERVENTION:    Continue Glucerna 1.2 at 60 ml/h via PEG  Provides 1728 kcal, 86 gm protein, 1159 ml free water daily  NUTRITION DIAGNOSIS:   Malnutrition (Severe) related to acute illness as evidenced by energy intake < or equal to 50% for > or equal to 5 days, severe depletion of muscle mass, percent weight loss. Ongoing.   GOAL:   Patient will meet greater than or equal to 90% of their needs Progressing.   MONITOR:   TF tolerance, Labs, I & O's  ASSESSMENT:   Pt with PMH of DM, HTN, colon cancer s/p resection 2009. 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 She underwent IR intervention with revascularization and is now in ICU on vent.  S/P trach and PEG 2/22. Remains on trach collar. Tolerating TF well, except for a small amount of vomit this AM, TF now on hold. Plans for d/c home with family today. CBG's [phone]  Diet Order:  DIET DYS 3 Room service appropriate? Yes; Fluid consistency: Thin  Skin:  Wound (see comment) (stage 1 to buttocks)  Last BM:  3/8  Height:   Ht Readings from Last 1 Encounters:  01/10/17 5\' 2"  (1.575 m)    Weight:   Wt Readings from Last 1 Encounters:  01/31/17 139 lb 5.3 oz (63.2 kg)    Ideal Body Weight:  50 kg  BMI:  Body mass index is 25.48 kg/m.  Estimated Nutritional Needs:   Kcal:  1500-1700  Protein:  85-100 gm  Fluid:  1.5-1.7 L  EDUCATION NEEDS:   No education needs identified at this time  Molli Barrows, [place], [place], [place] Pager [phone] After Hours Pager [phone]

## 2017-02-01 ENCOUNTER — Telehealth: Payer: Self-pay

## 2017-02-01 DIAGNOSIS — I69391 Dysphagia following cerebral infarction: Secondary | ICD-10-CM | POA: Diagnosis not present

## 2017-02-01 DIAGNOSIS — J9600 Acute respiratory failure, unspecified whether with hypoxia or hypercapnia: Secondary | ICD-10-CM | POA: Diagnosis not present

## 2017-02-01 DIAGNOSIS — R1311 Dysphagia, oral phase: Secondary | ICD-10-CM | POA: Diagnosis not present

## 2017-02-01 DIAGNOSIS — E1165 Type 2 diabetes mellitus with hyperglycemia: Secondary | ICD-10-CM | POA: Diagnosis not present

## 2017-02-01 DIAGNOSIS — C7889 Secondary malignant neoplasm of other digestive organs: Secondary | ICD-10-CM | POA: Diagnosis not present

## 2017-02-01 DIAGNOSIS — I69998 Other sequelae following unspecified cerebrovascular disease: Secondary | ICD-10-CM | POA: Diagnosis not present

## 2017-02-01 NOTE — Telephone Encounter (Signed)
Received call from [facility] regarding D/C meds. This was not at [facility] pt. Instructed the pharmacy to call physician on prescription

## 2017-02-04 ENCOUNTER — Encounter (HOSPITAL_COMMUNITY): Payer: Self-pay | Admitting: Emergency Medicine

## 2017-02-04 ENCOUNTER — Emergency Department (HOSPITAL_COMMUNITY): Payer: Medicare Other

## 2017-02-04 ENCOUNTER — Inpatient Hospital Stay (HOSPITAL_COMMUNITY)
Admission: EM | Admit: 2017-02-04 | Discharge: 2017-02-11 | DRG: 871 | Disposition: A | Discharge status: Home Health | Payer: Medicare Other | Attending: Internal Medicine | Admitting: Internal Medicine

## 2017-02-04 ENCOUNTER — Inpatient Hospital Stay (HOSPITAL_COMMUNITY): Payer: Medicare Other

## 2017-02-04 DIAGNOSIS — Z9049 Acquired absence of other specified parts of digestive tract: Secondary | ICD-10-CM | POA: Diagnosis not present

## 2017-02-04 DIAGNOSIS — Z4682 Encounter for fitting and adjustment of non-vascular catheter: Secondary | ICD-10-CM | POA: Diagnosis not present

## 2017-02-04 DIAGNOSIS — R778 Other specified abnormalities of plasma proteins: Secondary | ICD-10-CM | POA: Diagnosis present

## 2017-02-04 DIAGNOSIS — Z86718 Personal history of other venous thrombosis and embolism: Secondary | ICD-10-CM

## 2017-02-04 DIAGNOSIS — Z9849 Cataract extraction status, unspecified eye: Secondary | ICD-10-CM

## 2017-02-04 DIAGNOSIS — G9340 Encephalopathy, unspecified: Secondary | ICD-10-CM | POA: Diagnosis present

## 2017-02-04 DIAGNOSIS — C787 Secondary malignant neoplasm of liver and intrahepatic bile duct: Secondary | ICD-10-CM | POA: Diagnosis not present

## 2017-02-04 DIAGNOSIS — I63532 Cerebral infarction due to unspecified occlusion or stenosis of left posterior cerebral artery: Secondary | ICD-10-CM | POA: Diagnosis present

## 2017-02-04 DIAGNOSIS — Z66 Do not resuscitate: Secondary | ICD-10-CM | POA: Diagnosis present

## 2017-02-04 DIAGNOSIS — K9423 Gastrostomy malfunction: Secondary | ICD-10-CM | POA: Diagnosis present

## 2017-02-04 DIAGNOSIS — R5081 Fever presenting with conditions classified elsewhere: Secondary | ICD-10-CM | POA: Diagnosis not present

## 2017-02-04 DIAGNOSIS — Z9911 Dependence on respirator [ventilator] status: Secondary | ICD-10-CM

## 2017-02-04 DIAGNOSIS — Y92230 Patient room in hospital as the place of occurrence of the external cause: Secondary | ICD-10-CM | POA: Diagnosis not present

## 2017-02-04 DIAGNOSIS — Z515 Encounter for palliative care: Secondary | ICD-10-CM | POA: Diagnosis present

## 2017-02-04 DIAGNOSIS — Z7901 Long term (current) use of anticoagulants: Secondary | ICD-10-CM | POA: Diagnosis not present

## 2017-02-04 DIAGNOSIS — D649 Anemia, unspecified: Secondary | ICD-10-CM | POA: Diagnosis not present

## 2017-02-04 DIAGNOSIS — I10 Essential (primary) hypertension: Secondary | ICD-10-CM | POA: Diagnosis present

## 2017-02-04 DIAGNOSIS — Z930 Tracheostomy status: Secondary | ICD-10-CM | POA: Diagnosis not present

## 2017-02-04 DIAGNOSIS — J690 Pneumonitis due to inhalation of food and vomit: Secondary | ICD-10-CM | POA: Diagnosis present

## 2017-02-04 DIAGNOSIS — E1165 Type 2 diabetes mellitus with hyperglycemia: Secondary | ICD-10-CM | POA: Diagnosis present

## 2017-02-04 DIAGNOSIS — Z833 Family history of diabetes mellitus: Secondary | ICD-10-CM | POA: Diagnosis not present

## 2017-02-04 DIAGNOSIS — Z931 Gastrostomy status: Secondary | ICD-10-CM

## 2017-02-04 DIAGNOSIS — D6869 Other thrombophilia: Secondary | ICD-10-CM | POA: Diagnosis present

## 2017-02-04 DIAGNOSIS — G9349 Other encephalopathy: Secondary | ICD-10-CM | POA: Diagnosis present

## 2017-02-04 DIAGNOSIS — Z79899 Other long term (current) drug therapy: Secondary | ICD-10-CM

## 2017-02-04 DIAGNOSIS — R29703 NIHSS score 3: Secondary | ICD-10-CM | POA: Diagnosis not present

## 2017-02-04 DIAGNOSIS — Z7189 Other specified counseling: Secondary | ICD-10-CM | POA: Diagnosis not present

## 2017-02-04 DIAGNOSIS — I214 Non-ST elevation (NSTEMI) myocardial infarction: Secondary | ICD-10-CM | POA: Diagnosis present

## 2017-02-04 DIAGNOSIS — R748 Abnormal levels of other serum enzymes: Secondary | ICD-10-CM | POA: Diagnosis not present

## 2017-02-04 DIAGNOSIS — A419 Sepsis, unspecified organism: Principal | ICD-10-CM | POA: Insufficient documentation

## 2017-02-04 DIAGNOSIS — Z7401 Bed confinement status: Secondary | ICD-10-CM

## 2017-02-04 DIAGNOSIS — I639 Cerebral infarction, unspecified: Secondary | ICD-10-CM | POA: Diagnosis not present

## 2017-02-04 DIAGNOSIS — Z85038 Personal history of other malignant neoplasm of large intestine: Secondary | ICD-10-CM

## 2017-02-04 DIAGNOSIS — J9620 Acute and chronic respiratory failure, unspecified whether with hypoxia or hypercapnia: Secondary | ICD-10-CM | POA: Diagnosis present

## 2017-02-04 DIAGNOSIS — R7989 Other specified abnormal findings of blood chemistry: Secondary | ICD-10-CM

## 2017-02-04 DIAGNOSIS — J9811 Atelectasis: Secondary | ICD-10-CM | POA: Diagnosis present

## 2017-02-04 DIAGNOSIS — R55 Syncope and collapse: Secondary | ICD-10-CM | POA: Diagnosis not present

## 2017-02-04 DIAGNOSIS — R0602 Shortness of breath: Secondary | ICD-10-CM | POA: Diagnosis not present

## 2017-02-04 DIAGNOSIS — R1310 Dysphagia, unspecified: Secondary | ICD-10-CM | POA: Diagnosis present

## 2017-02-04 DIAGNOSIS — E118 Type 2 diabetes mellitus with unspecified complications: Secondary | ICD-10-CM

## 2017-02-04 DIAGNOSIS — R402431 Glasgow coma scale score 3-8, in the field [EMT or ambulance]: Secondary | ICD-10-CM | POA: Diagnosis not present

## 2017-02-04 DIAGNOSIS — I63019 Cerebral infarction due to thrombosis of unspecified vertebral artery: Secondary | ICD-10-CM

## 2017-02-04 DIAGNOSIS — I82431 Acute embolism and thrombosis of right popliteal vein: Secondary | ICD-10-CM

## 2017-02-04 DIAGNOSIS — E785 Hyperlipidemia, unspecified: Secondary | ICD-10-CM | POA: Diagnosis present

## 2017-02-04 DIAGNOSIS — Z8249 Family history of ischemic heart disease and other diseases of the circulatory system: Secondary | ICD-10-CM

## 2017-02-04 DIAGNOSIS — T360X5A Adverse effect of penicillins, initial encounter: Secondary | ICD-10-CM | POA: Diagnosis not present

## 2017-02-04 DIAGNOSIS — J9621 Acute and chronic respiratory failure with hypoxia: Secondary | ICD-10-CM | POA: Diagnosis not present

## 2017-02-04 DIAGNOSIS — Z794 Long term (current) use of insulin: Secondary | ICD-10-CM | POA: Diagnosis not present

## 2017-02-04 DIAGNOSIS — C7889 Secondary malignant neoplasm of other digestive organs: Secondary | ICD-10-CM | POA: Diagnosis present

## 2017-02-04 DIAGNOSIS — I119 Hypertensive heart disease without heart failure: Secondary | ICD-10-CM | POA: Diagnosis present

## 2017-02-04 DIAGNOSIS — R945 Abnormal results of liver function studies: Secondary | ICD-10-CM

## 2017-02-04 DIAGNOSIS — R509 Fever, unspecified: Secondary | ICD-10-CM | POA: Diagnosis present

## 2017-02-04 DIAGNOSIS — D638 Anemia in other chronic diseases classified elsewhere: Secondary | ICD-10-CM | POA: Diagnosis present

## 2017-02-04 DIAGNOSIS — R402432 Glasgow coma scale score 3-8, at arrival to emergency department: Secondary | ICD-10-CM

## 2017-02-04 DIAGNOSIS — R29810 Facial weakness: Secondary | ICD-10-CM | POA: Diagnosis present

## 2017-02-04 DIAGNOSIS — R6510 Systemic inflammatory response syndrome (SIRS) of non-infectious origin without acute organ dysfunction: Secondary | ICD-10-CM | POA: Diagnosis present

## 2017-02-04 DIAGNOSIS — I69391 Dysphagia following cerebral infarction: Secondary | ICD-10-CM

## 2017-02-04 LAB — I-STAT ARTERIAL BLOOD GAS, ED
ACID-BASE EXCESS: 6 mmol/L — AB (ref 0.0–2.0)
BICARBONATE: 29.9 mmol/L — AB (ref 20.0–28.0)
O2 SAT: 100 %
PCO2 ART: 40.2 mmHg (ref 32.0–48.0)
PO2 ART: 159 mmHg — AB (ref 83.0–108.0)
Patient temperature: 100.2
TCO2: 31 mmol/L (ref 0–100)
pH, Arterial: 7.483 — ABNORMAL HIGH (ref 7.350–7.450)

## 2017-02-04 LAB — COMPREHENSIVE METABOLIC PANEL
ALBUMIN: 2.2 g/dL — AB (ref 3.5–5.0)
ALK PHOS: 460 U/L — AB (ref 38–126)
ALT: 135 U/L — AB (ref 14–54)
ANION GAP: 7 (ref 5–15)
AST: 156 U/L — ABNORMAL HIGH (ref 15–41)
BILIRUBIN TOTAL: 1.5 mg/dL — AB (ref 0.3–1.2)
BUN: 20 mg/dL (ref 6–20)
CALCIUM: 10.8 mg/dL — AB (ref 8.9–10.3)
CO2: 27 mmol/L (ref 22–32)
CREATININE: 0.93 mg/dL (ref 0.44–1.00)
Chloride: 103 mmol/L (ref 101–111)
GFR calc non Af Amer: 59 mL/min — ABNORMAL LOW (ref >60)
GLUCOSE: 195 mg/dL — AB (ref 65–99)
Potassium: 5.1 mmol/L (ref 3.5–5.1)
Sodium: 137 mmol/L (ref 135–145)
TOTAL PROTEIN: 6.2 g/dL — AB (ref 6.5–8.1)

## 2017-02-04 LAB — CBC WITH DIFFERENTIAL/PLATELET
BASOS ABS: 0 10*3/uL (ref 0.0–0.1)
BASOS PCT: 0 %
EOS ABS: 0 10*3/uL (ref 0.0–0.7)
Eosinophils Relative: 0 %
HEMATOCRIT: 30.7 % — AB (ref 36.0–46.0)
Hemoglobin: 9.5 g/dL — ABNORMAL LOW (ref 12.0–15.0)
Lymphocytes Relative: 7 %
Lymphs Abs: 1 10*3/uL (ref 0.7–4.0)
MCH: 28.4 pg (ref 26.0–34.0)
MCHC: 30.9 g/dL (ref 30.0–36.0)
MCV: 91.6 fL (ref 78.0–100.0)
MONO ABS: 1.7 10*3/uL — AB (ref 0.1–1.0)
Monocytes Relative: 12 %
NEUTROS ABS: 11.7 10*3/uL — AB (ref 1.7–7.7)
Neutrophils Relative %: 81 %
PLATELETS: 177 10*3/uL (ref 150–400)
RBC: 3.35 MIL/uL — ABNORMAL LOW (ref 3.87–5.11)
RDW: 17.2 % — AB (ref 11.5–15.5)
WBC: 14.4 10*3/uL — ABNORMAL HIGH (ref 4.0–10.5)

## 2017-02-04 LAB — CBG MONITORING, ED
GLUCOSE-CAPILLARY: 203 mg/dL — AB (ref 65–99)
GLUCOSE-CAPILLARY: 187 mg/dL — AB (ref 65–99)

## 2017-02-04 LAB — I-STAT CHEM 8, ED
BUN: 26 mg/dL — ABNORMAL HIGH (ref 6–20)
CALCIUM ION: 1.38 mmol/L (ref 1.15–1.40)
Chloride: 105 mmol/L (ref 101–111)
Creatinine, Ser: 0.9 mg/dL (ref 0.44–1.00)
GLUCOSE: 197 mg/dL — AB (ref 65–99)
HEMATOCRIT: 29 % — AB (ref 36.0–46.0)
Hemoglobin: 9.9 g/dL — ABNORMAL LOW (ref 12.0–15.0)
Potassium: 5.1 mmol/L (ref 3.5–5.1)
SODIUM: 137 mmol/L (ref 135–145)
TCO2: 29 mmol/L (ref 0–100)

## 2017-02-04 LAB — URINALYSIS, ROUTINE W REFLEX MICROSCOPIC
BACTERIA UA: NONE SEEN
BILIRUBIN URINE: NEGATIVE
GLUCOSE, UA: 50 mg/dL — AB
HGB URINE DIPSTICK: NEGATIVE
Ketones, ur: NEGATIVE mg/dL
LEUKOCYTES UA: NEGATIVE
NITRITE: NEGATIVE
PH: 7 (ref 5.0–8.0)
Protein, ur: 30 mg/dL — AB
SPECIFIC GRAVITY, URINE: 1.023 (ref 1.005–1.030)

## 2017-02-04 LAB — I-STAT CG4 LACTIC ACID, ED
Lactic Acid, Venous: 2.23 mmol/L (ref 0.5–1.9)
Lactic Acid, Venous: 1.79 mmol/L (ref 0.5–1.9)

## 2017-02-04 LAB — GLUCOSE, CAPILLARY
GLUCOSE-CAPILLARY: 183 mg/dL — AB (ref 65–99)
GLUCOSE-CAPILLARY: 208 mg/dL — AB (ref 65–99)

## 2017-02-04 LAB — I-STAT TROPONIN, ED
TROPONIN I, POC: 4.67 ng/mL — AB (ref 0.00–0.08)
Troponin i, poc: 5.84 ng/mL (ref 0.00–0.08)

## 2017-02-04 LAB — TROPONIN I
Troponin I: 3.91 ng/mL (ref <0.03)
Troponin I: 4.98 ng/mL (ref <0.03)

## 2017-02-04 LAB — D-DIMER, QUANTITATIVE: D-Dimer, Quant: 6.09 ug/mL-FEU — ABNORMAL HIGH (ref 0.00–0.50)

## 2017-02-04 MED ORDER — HALOPERIDOL 1 MG PO TABS: 0.5000 mg | ORAL_TABLET | ORAL | Status: DC | PRN

## 2017-02-04 MED ORDER — ONDANSETRON 4 MG PO TBDP: 4.0000 mg | ORAL_TABLET | Freq: Four times a day (QID) | ORAL | Status: DC | PRN

## 2017-02-04 MED ORDER — GLYCOPYRROLATE 0.2 MG/ML IJ SOLN: 0.2000 mg | INTRAMUSCULAR | Status: DC | PRN

## 2017-02-04 MED ORDER — IPRATROPIUM-ALBUTEROL 0.5-2.5 (3) MG/3ML IN SOLN: 3.0000 mL | RESPIRATORY_TRACT | Status: DC | PRN

## 2017-02-04 MED ORDER — IOPAMIDOL (ISOVUE-300) INJECTION 61%
INTRAVENOUS | Status: AC
  Administered 2017-02-04: 50 mL
  Filled 2017-02-04: qty 50

## 2017-02-04 MED ORDER — SODIUM CHLORIDE 0.9 % IV SOLN
INTRAVENOUS | Status: DC
  Administered 2017-02-04: 125 mL/h via INTRAVENOUS
  Administered 2017-02-05 – 2017-02-11 (×5): via INTRAVENOUS

## 2017-02-04 MED ORDER — FREE WATER: 240.0000 mL | Freq: Four times a day (QID) | Status: DC

## 2017-02-04 MED ORDER — ONDANSETRON HCL 4 MG/2ML IJ SOLN: 4.0000 mg | Freq: Four times a day (QID) | INTRAMUSCULAR | Status: DC | PRN

## 2017-02-04 MED ORDER — POLYVINYL ALCOHOL 1.4 % OP SOLN: 1.0000 [drp] | Freq: Four times a day (QID) | OPHTHALMIC | Status: DC | PRN

## 2017-02-04 MED ORDER — LORAZEPAM 2 MG/ML IJ SOLN: 1.0000 mg | INTRAMUSCULAR | Status: DC | PRN

## 2017-02-04 MED ORDER — ENOXAPARIN SODIUM 40 MG/0.4ML ~~LOC~~ SOLN: 40.0000 mg | SUBCUTANEOUS | Status: DC

## 2017-02-04 MED ORDER — VANCOMYCIN HCL IN DEXTROSE 1-5 GM/200ML-% IV SOLN
1000.0000 mg | Freq: Once | INTRAVENOUS | Status: AC
  Administered 2017-02-04: 1000 mg via INTRAVENOUS
  Filled 2017-02-04: qty 200

## 2017-02-04 MED ORDER — INSULIN ASPART 100 UNIT/ML ~~LOC~~ SOLN
0.0000 [IU] | Freq: Three times a day (TID) | SUBCUTANEOUS | Status: DC
  Administered 2017-02-04 – 2017-02-05 (×2): 3 [IU] via SUBCUTANEOUS
  Administered 2017-02-05: 5 [IU] via SUBCUTANEOUS
  Administered 2017-02-05: 3 [IU] via SUBCUTANEOUS
  Administered 2017-02-06: 2 [IU] via SUBCUTANEOUS
  Administered 2017-02-06: 5 [IU] via SUBCUTANEOUS
  Administered 2017-02-06: 3 [IU] via SUBCUTANEOUS
  Administered 2017-02-07: 2 [IU] via SUBCUTANEOUS
  Administered 2017-02-07: 3 [IU] via SUBCUTANEOUS
  Administered 2017-02-07: 5 [IU] via SUBCUTANEOUS
  Administered 2017-02-08: 3 [IU] via SUBCUTANEOUS
  Administered 2017-02-08: 5 [IU] via SUBCUTANEOUS
  Administered 2017-02-09: 2 [IU] via SUBCUTANEOUS
  Administered 2017-02-09: 5 [IU] via SUBCUTANEOUS
  Administered 2017-02-09: 2 [IU] via SUBCUTANEOUS
  Administered 2017-02-10: 8 [IU] via SUBCUTANEOUS
  Administered 2017-02-10: 3 [IU] via SUBCUTANEOUS
  Administered 2017-02-10: 8 [IU] via SUBCUTANEOUS
  Administered 2017-02-11 (×2): 3 [IU] via SUBCUTANEOUS
  Administered 2017-02-11: 5 [IU] via SUBCUTANEOUS
  Filled 2017-02-04: qty 1

## 2017-02-04 MED ORDER — ACETAMINOPHEN 650 MG RE SUPP: 650.0000 mg | RECTAL | Status: DC | PRN

## 2017-02-04 MED ORDER — LORAZEPAM 1 MG PO TABS: 1.0000 mg | ORAL_TABLET | ORAL | Status: DC | PRN

## 2017-02-04 MED ORDER — INSULIN ASPART 100 UNIT/ML ~~LOC~~ SOLN
0.0000 [IU] | Freq: Every day | SUBCUTANEOUS | Status: DC
  Administered 2017-02-05: 2 [IU] via SUBCUTANEOUS
  Administered 2017-02-10: 5 [IU] via SUBCUTANEOUS

## 2017-02-04 MED ORDER — FENTANYL BOLUS VIA INFUSION
10.0000 ug | INTRAVENOUS | Status: DC | PRN
  Filled 2017-02-04: qty 10

## 2017-02-04 MED ORDER — SODIUM CHLORIDE 0.9 % IV BOLUS (SEPSIS)
1000.0000 mL | Freq: Once | INTRAVENOUS | Status: AC
  Administered 2017-02-04: 1000 mL via INTRAVENOUS

## 2017-02-04 MED ORDER — INSULIN GLARGINE 100 UNIT/ML ~~LOC~~ SOLN: 12.0000 [IU] | Freq: Every day | SUBCUTANEOUS | Status: DC

## 2017-02-04 MED ORDER — FENTANYL 2500MCG IN NS 250ML (10MCG/ML) PREMIX INFUSION: 10.0000 ug/h | INTRAVENOUS | Status: DC

## 2017-02-04 MED ORDER — ACETAMINOPHEN 325 MG PO TABS: 650.0000 mg | ORAL_TABLET | ORAL | Status: DC | PRN

## 2017-02-04 MED ORDER — PIPERACILLIN-TAZOBACTAM 3.375 G IVPB
3.3750 g | Freq: Three times a day (TID) | INTRAVENOUS | Status: DC
  Administered 2017-02-04 – 2017-02-07 (×9): 3.375 g via INTRAVENOUS
  Filled 2017-02-04 (×12): qty 50

## 2017-02-04 MED ORDER — ACETAMINOPHEN 160 MG/5ML PO SOLN
650.0000 mg | ORAL | Status: DC | PRN
  Administered 2017-02-10 (×2): 650 mg
  Filled 2017-02-04 (×2): qty 20.3

## 2017-02-04 MED ORDER — VANCOMYCIN HCL 500 MG IV SOLR
500.0000 mg | Freq: Two times a day (BID) | INTRAVENOUS | Status: DC
  Administered 2017-02-04 – 2017-02-07 (×6): 500 mg via INTRAVENOUS
  Filled 2017-02-04 (×10): qty 500

## 2017-02-04 MED ORDER — BIOTENE DRY MOUTH MT LIQD: 15.0000 mL | OROMUCOSAL | Status: DC | PRN

## 2017-02-04 MED ORDER — GUAIFENESIN ER 600 MG PO TB12
1200.0000 mg | ORAL_TABLET | Freq: Two times a day (BID) | ORAL | Status: DC
  Administered 2017-02-05 – 2017-02-11 (×13): 1200 mg via ORAL
  Filled 2017-02-04 (×13): qty 2

## 2017-02-04 MED ORDER — HALOPERIDOL LACTATE 2 MG/ML PO CONC
0.5000 mg | ORAL | Status: DC | PRN
  Filled 2017-02-04: qty 0.3

## 2017-02-04 MED ORDER — LORAZEPAM 2 MG/ML PO CONC: 1.0000 mg | ORAL | Status: DC | PRN

## 2017-02-04 MED ORDER — INSULIN ASPART 100 UNIT/ML ~~LOC~~ SOLN: 0.0000 [IU] | Freq: Three times a day (TID) | SUBCUTANEOUS | Status: DC

## 2017-02-04 MED ORDER — ACETAMINOPHEN 650 MG RE SUPP
650.0000 mg | Freq: Once | RECTAL | Status: AC
  Administered 2017-02-04: 650 mg via RECTAL
  Filled 2017-02-04: qty 1

## 2017-02-04 MED ORDER — SODIUM CHLORIDE 0.9 % IV SOLN: INTRAVENOUS | Status: DC

## 2017-02-04 MED ORDER — IOPAMIDOL (ISOVUE-300) INJECTION 61%
50.0000 mL | Freq: Once | INTRAVENOUS | Status: AC | PRN
  Administered 2017-02-04: 50 mL

## 2017-02-04 MED ORDER — PIPERACILLIN-TAZOBACTAM 3.375 G IVPB 30 MIN
3.3750 g | Freq: Once | INTRAVENOUS | Status: AC
  Administered 2017-02-04: 3.375 g via INTRAVENOUS
  Filled 2017-02-04: qty 50

## 2017-02-04 MED ORDER — SODIUM CHLORIDE 0.9 % IV SOLN: 12.5000 mg | Freq: Four times a day (QID) | INTRAVENOUS | Status: DC | PRN

## 2017-02-04 MED ORDER — INSULIN ASPART 100 UNIT/ML ~~LOC~~ SOLN: 0.0000 [IU] | Freq: Every day | SUBCUTANEOUS | Status: DC

## 2017-02-04 MED ORDER — INSULIN ASPART 100 UNIT/ML ~~LOC~~ SOLN: 0.0000 [IU] | SUBCUTANEOUS | Status: DC

## 2017-02-04 MED ORDER — LORAZEPAM 2 MG/ML IJ SOLN
1.0000 mg | INTRAMUSCULAR | Status: DC | PRN
Start: 2017-02-04 — End: 2017-02-11

## 2017-02-04 MED ORDER — DIPHENHYDRAMINE HCL 50 MG/ML IJ SOLN: 12.5000 mg | INTRAMUSCULAR | Status: DC | PRN

## 2017-02-04 MED ORDER — HALOPERIDOL LACTATE 5 MG/ML IJ SOLN: 0.5000 mg | INTRAMUSCULAR | Status: DC | PRN

## 2017-02-04 MED ORDER — GLYCOPYRROLATE 1 MG PO TABS: 1.0000 mg | ORAL_TABLET | ORAL | Status: DC | PRN

## 2017-02-04 NOTE — ED Notes (Signed)
cbg was 187

## 2017-02-04 NOTE — ED Notes (Signed)
Dressing at G-tube insertion site wet and soiled. Shin at G-tube cleaned, polysporin applied and clean dressing in place

## 2017-02-04 NOTE — ED Notes (Signed)
Santiago Glad NP at bedside for admission.

## 2017-02-04 NOTE — ED Notes (Signed)
Port abd x-ray being obtained for G-tube placement.  Per family request.

## 2017-02-04 NOTE — ED Provider Notes (Signed)
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 They report that the main reason they are here is because her PEG tube began leaking what appeared to be stool after they gave her Tylenol at home for fever. They report she has had fever for the past several days. They also report that she has not been as responsive for the past several days. She is bedbound. They deny any falls or injuries. They deny any cough. No vomiting or diarrhea. She has been having normal bowel movements without blood. She does not wear oxygen at home. Was febrile with EMS to 102. Was not hypoxic. They state this time she is a full code.  ROS: Level V caveat for altered mental status  PAST MEDICAL HISTORY/PAST SURGICAL HISTORY:  Past Medical History:  Diagnosis Date  . Arthritis   . Colon cancer ([place])   . Diabetes mellitus   . Hyperlipidemia   . Hypertension   . UTI (urinary tract infection)     MEDICATIONS:  Prior to Admission medications   Medication Sig Start Date End Date Taking? Authorizing Provider  chlorhexidine (PERIDEX) 0.12 % solution 15 mLs by Mouth Rinse route 2 (two) times daily. 01/31/17   David L Rinehuls, PA-C  docusate (COLACE) 50 MG/5ML liquid Place 5 mLs (50 mg total) into feeding tube at bedtime. 01/31/17   David L Rinehuls, PA-C  enoxaparin (LOVENOX) 60 MG/0.6ML injection Inject 0.6 mLs (60 mg total) into the skin every 12 (twelve) hours. 01/31/17   David L Rinehuls, PA-C  insulin aspart (NOVOLOG) 100 UNIT/ML injection Inject 0-15 Units into  the skin every 4 (four) hours. 01/31/17   David L Rinehuls, PA-C  insulin aspart (NOVOLOG) 100 UNIT/ML injection Inject 8 Units into the skin every 4 (four) hours. 01/31/17   David L Rinehuls, PA-C  insulin glargine (LANTUS) 100 UNIT/ML injection Inject 0.2 mLs (20 Units total) into the skin at bedtime. 01/31/17   David L Rinehuls, PA-C  losartan (COZAAR) 50 MG tablet Take 50 mg by mouth daily.    Historical Provider, MD  magnesium hydroxide (MILK OF MAGNESIA) 400 MG/5ML suspension Place 30 mLs into feeding tube daily as needed for mild constipation. 01/31/17   David L Rinehuls, PA-C  pantoprazole sodium (PROTONIX) 40 mg/20 mL PACK Place 20 mLs (40 mg total) into feeding tube daily. 02/01/17   David L Rinehuls, PA-C  potassium chloride 20 MEQ/15ML (10%) SOLN Place 22.5 mLs (30 mEq total) into feeding tube 2 (two) times daily. 01/31/17   David L Rinehuls, PA-C  sennosides (SENOKOT) 8.8 MG/5ML syrup Place 5 mLs into feeding tube at bedtime. 01/31/17   David L Rinehuls, PA-C  sodium phosphate (FLEET) 7-19 GM/118ML ENEM Place 133 mLs (1 enema total) rectally daily as needed for severe constipation. 01/31/17   David L Rinehuls, PA-C  Water For Irrigation, Sterile (FREE WATER) SOLN Place 240 mLs into feeding tube every 6 (six) hours. 01/31/17   Early Chars Rinehuls, PA-C    ALLERGIES:  No Known Allergies  SOCIAL HISTORY:  Social History  Substance Use Topics  . Smoking status: Never Smoker  .  Smokeless tobacco: Never Used  . Alcohol use No    FAMILY HISTORY: No family history on file.  EXAM: BP 96/73 (BP Location: Right Arm)   Pulse 111   Temp 100.2 F (37.9 C) (Rectal)   Resp (!) 42   Ht 5\' 2"  (1.575 m)   Wt 139 lb (63 kg)   SpO2 96%   BMI 25.42 kg/m  CONSTITUTIONAL: Alert Intermittently but does not follow commands or answer questions. Chronically ill-appearing, cachectic, elderly, febrile HEAD: Normocephalic, atraumatic EYES: Conjunctivae clear, pupils appear equal, EOMI, patient will open her eyes  intermittently but does not do so with being asked ENT: normal nose; slightly dry mucous membranes NECK: Supple, no meningismus, no nuchal rigidity, no LAD, tracheostomy in place without surrounding erythema or warmth or any drainage  CARD: Regular and tachycardic; S1 and S2 appreciated; no murmurs, no clicks, no rubs, no gallops RESP: Normal chest excursion without splinting him a patient is tachypneic, breath sounds clear and equal bilaterally; no wheezes, no rhonchi, no rales, no hypoxia with trach collar ABD/GI: Normal bowel sounds; non-distended; soft, non-tender, no rebound, no guarding, no peritoneal signs, no hepatosplenomegaly, PEG tube in place BACK:  The back appears normal and is non-tender to palpation, there is no CVA tenderness; sacral decub breakdown stage I RECTAL:  Brown stool in the rectal vault, no gross blood or melena, external nonbleeding hemorrhoids EXT: non-tender to palpation; no edema; normal capillary refill; no cyanosis, no calf tenderness or swelling; no joint effusion,     SKIN: Normal color for age and race; warm; no rash NEURO: Unresponsive. Patient will open her eyes at times intermittently. Does not follow commands. Does not move her extremities on command. Does move her left leg intermittently. Does not talk.   MEDICAL DECISION MAKING: Patient here with altered status, fever. She is tachycardic and tachypneic. She meets SIRs criteria and we will initiate code sepsis. At this time family wants her to be a full code. Will obtain labs, urine, cultures, chest x-ray, abdominal x-ray and a CT of her head. We'll give IV fluids and broad-spectrum antibiotics.  ED PROGRESS: 7:15 AM  Patient's labs show leukocytosis of 14,000. Also has elevated lactate and elevated troponin with normal EKG. This may be from demand ischemia. ABG is reassuring. Glucose is normal. CT of the head shows developing low-attenuation and sulcal effacement in the left occipital lobe suggesting a  progressing acute infarct compared to previous CT on 01/31/17. There are evolutionary changes of previous infarcts in the right occipital region and bilateral cerebellum. No hemorrhage seen or mass effect. X-rays of her chest and abdomen are unremarkable. We'll discuss with neurology on-call. Urinalysis pending. Heart rate improving. Patient's blood pressure still normal but she is still tachypneic.  7:33 AM  Pt's urine shows no obvious sign of infection. Culture is pending. Given positive troponin, low-grade fever, tachypnea, will add on a d-dimer. Currently her lungs are clear and she has no increased oxygen requirement. She is on a trach collar getting humidified air but we have not seen any hypoxia.  Will d/w hospitalist for admission. Discuss with Dr. Wendee Beavers with neurology who will see the patient in consult. We appreciate his help.  Patient appears to have worsening liver function tests compared to her previous test in March. Again I suspect all of this is multisystem organ failure from sepsis although we do not currently have a source. Blood and urine cultures are pending. I feel patient would benefit from palliative care. Have attempted to  discuss this with patient's family who does not appear ready to have this conversation yet and at this time would like her to be a full code.  7:53 AM Discussed patient's case with hospitalist, Dyanne Carrel NP.  I have recommended admission and patient (and family if present) agree with this plan. Admitting team will place admission orders.   Santiago Glad is aware d dimer is pending and will follow up on results.    I reviewed all nursing notes, vitals, pertinent previous records, EKGs, lab and urine results, imaging (as available).     EKG Interpretation  Date/Time:  Tuesday February 04 2017 06:27:09 EDT Ventricular Rate:  96 PR Interval:    QRS Duration: 68 QT Interval:  326 QTC Calculation: 412 R Axis:   3 Text Interpretation:  Sinus rhythm Short PR interval  Abnormal R-wave progression, early transition Inferior infarct, age indeterminate Confirmed by Emir Nack,  DO, Leana [place]ston ([id number]) on 02/04/2017 6:34:12 AM        CRITICAL CARE Performed by: Nyra Jabs   Total critical care time: 45 minutes  Critical care time was exclusive of separately billable procedures and treating other patients.  Critical care was necessary to treat or prevent imminent or life-threatening deterioration.  Critical care was time spent personally by me on the following activities: development of treatment plan with patient and/or surrogate as well as nursing, discussions with consultants, evaluation of patient's response to treatment, examination of patient, obtaining history from patient or surrogate, ordering and performing treatments and interventions, ordering and review of laboratory studies, ordering and review of radiographic studies, pulse oximetry and re-evaluation of patient's condition.    [place], DO 02/04/17 [phone]

## 2017-02-04 NOTE — H&P (Signed)
History and Physical    April Franklin DGU:[id number] DOB: 05/20/1941 DOA: 02/04/2017  PCP: [name], MD Patient coming from: home  Chief Complaint: leaking g tube/intermittent fever  HPI: April Franklin is a 76 y.o. 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 Initial evaluation reveals a chest dressing acute infarct and evolutionary changes of previous infarct, elevated troponin, fever.  Information is obtained from the family is at the bedside as patient is nonverbal. She was discharged 4 days ago from the hospital and daughter states she was "quite sleepy at that point". During the 4 days at home she developed intermittent fevers and worsening lethargy. Daughter reports noting leaking around her G-tube when she was "giving her Tylenol for her fever" so he came to the hospital. Does indicate they have been administering medications through her G-tube. They note she's been bedbound since her discharge they deny any diarrhea, any vomiting or indications of discomfort. Do note some increased coughing and secretion production. He reports she is not on oxygen at home. Called EMS he reports a temperature of 102.    ED Course: In the emergency department she is tachycardia and tachypnea with fever meeting sirs criteria. Code sepsis was initiated. She was provided with IV fluids and broad-spectrum antibiotics were administered. The time of my exam she is responsive to vigorous sternal rub only opens eyes briefly facial grimace spontaneous movement of left leg  Review of Systems: As per HPI  otherwise 10 point review of systems negative.   Ambulatory Status: She has been bedbound since her recent stroke  Past Medical History:  Diagnosis Date  . Arthritis   . Colon cancer ([place])   . Diabetes mellitus   . Hyperlipidemia   . Hypertension   . UTI (urinary tract infection)     Past Surgical History:  Procedure Laterality Date  . ABDOMINAL HYSTERECTOMY    . APPENDECTOMY    . CATARACT EXTRACTION    . COLON RESECTION    . COLONOSCOPY    . ESOPHAGOGASTRODUODENOSCOPY N/A 01/16/2017   Procedure: ESOPHAGOGASTRODUODENOSCOPY (EGD);  Surgeon: Georganna Skeans, MD;  Location: [facility];  Service: General;  Laterality: N/A;  bedside  . FLEXIBLE SIGMOIDOSCOPY N/A 10/10/2015   Procedure: FLEXIBLE SIGMOIDOSCOPY;  Surgeon: Garlan Fair, MD;  Location: [facility];  Service: Endoscopy;  Laterality: N/A;  UNSEDATED  . IR GENERIC HISTORICAL  01/10/2017   IR PERCUTANEOUS ART THROMBECTOMY/INFUSION INTRACRANIAL INC DIAG ANGIO 01/10/2017 Luanne Bras, MD [facility]  . IR GENERIC HISTORICAL  01/10/2017   IR ANGIO VERTEBRAL SEL SUBCLAVIAN INNOMINATE UNI L MOD SED 01/10/2017 Luanne Bras, MD [facility]  . LEG SURGERY    . PEG PLACEMENT N/A 01/16/2017   Procedure: PERCUTANEOUS ENDOSCOPIC GASTROSTOMY (PEG) PLACEMENT;  Surgeon: Georganna Skeans, MD;  Location: [facility];  Service: General;  Laterality: N/A;  . RADIOLOGY WITH ANESTHESIA N/A 01/10/2017   Procedure: RADIOLOGY WITH ANESTHESIA;  Surgeon: Medication Radiologist, MD;  Location: [place];  Service: Radiology;  Laterality: N/A;    Social History   Social History  . Marital status: Married    Spouse name: N/A  . Number of children: N/A  . Years of education: N/A   Occupational History  . Not  on file.   Social History Main Topics  . Smoking status: Never Smoker  . Smokeless tobacco: Never Used  . Alcohol use No  . Drug use: No  . Sexual activity: No   Other Topics Concern  . Not on file   Social History Narrative   . No narrative on file  Patient lives at home with her husband has done so for the last 69 years  No Known Allergies  Family History  Problem Relation Age of Onset  . Diabetes Mother   . CAD Father     Prior to Admission medications   Medication Sig Start Date End Date Taking? Authorizing Provider  chlorhexidine (PERIDEX) 0.12 % solution 15 mLs by Mouth Rinse route 2 (two) times daily. 01/31/17  Yes David L Rinehuls, PA-C  docusate (COLACE) 50 MG/5ML liquid Place 5 mLs (50 mg total) into feeding tube at bedtime. 01/31/17  Yes David L Rinehuls, PA-C  enoxaparin (LOVENOX) 60 MG/0.6ML injection Inject 0.6 mLs (60 mg total) into the skin every 12 (twelve) hours. 01/31/17  Yes David L Rinehuls, PA-C  insulin aspart (NOVOLOG) 100 UNIT/ML injection Inject 8 Units into the skin every 4 (four) hours. 01/31/17  Yes David L Rinehuls, PA-C  insulin glargine (LANTUS) 100 UNIT/ML injection Inject 0.2 mLs (20 Units total) into the skin at bedtime. 01/31/17  Yes David L Rinehuls, PA-C  losartan (COZAAR) 50 MG tablet Take 50 mg by mouth daily.   Yes Historical Provider, MD  magnesium hydroxide (MILK OF MAGNESIA) 400 MG/5ML suspension Place 30 mLs into feeding tube daily as needed for mild constipation. 01/31/17  Yes David L Rinehuls, PA-C  potassium chloride 20 MEQ/15ML (10%) SOLN Place 22.5 mLs (30 mEq total) into feeding tube 2 (two) times daily. 01/31/17  Yes David L Rinehuls, PA-C  sennosides (SENOKOT) 8.8 MG/5ML syrup Place 5 mLs into feeding tube at bedtime. 01/31/17  Yes David L Rinehuls, PA-C  sodium phosphate (FLEET) 7-19 GM/118ML ENEM Place 133 mLs (1 enema total) rectally daily as needed for severe constipation. 01/31/17  Yes David L Rinehuls, PA-C  Water For Irrigation, Sterile (FREE WATER) SOLN Place 240 mLs into feeding tube every 6 (six) hours. 01/31/17  Yes David L Rinehuls, PA-C  pantoprazole sodium (PROTONIX) 40 mg/20 mL PACK Place 20 mLs (40 mg total) into feeding tube daily. 02/01/17   Early Chars Rinehuls, PA-C     Physical Exam: Vitals:   02/04/17 0730 02/04/17 0748 02/04/17 0815 02/04/17 0844  BP: 148/59 127/62 120/65   Pulse: 93 90 99 96  Resp: 25 (!) 36 (!) 28 (!) 32  Temp:      TempSrc:      SpO2: 96% 97% 95% 96%  Weight:      Height:         General:  Appears Frail unresponsive to verbal stimuli Eyes:  PERRL, EOMI, normal lids, iris ENT:  grossly normal hearing, lips & tongue, mucous membranes of her mouth are somewhat pale slightly dry Neck:  no LAD, masses or thyromegaly trachea intact Cardiovascular:  Tachycardia but regular no m/r/g. No LE edema.  Respiratory:  Mild increased work of breathing, respiration shallow, intermittent moist productive cough fair air movement no wheeze Abdomen:  soft, ntnd, peg tube intack no guarding or rebounding Skin:  no rash or induration seen on limited exam Musculoskeletal:  grossly normal tone BUE/BLE, good ROM, no bony abnormality Psychiatric:  grossly normal mood and affect, speech fluent and appropriate, AOx3 Neurologic:  Opens eyes briefly to  sternal rub continues with facial grimace some spontaneous movement of her left leg does not follow commands  Labs on Admission: I have personally reviewed following labs and imaging studies  CBC:  Recent Labs Lab 02/04/17 0557 02/04/17 0631  WBC 14.4*  --   NEUTROABS 11.7*  --   HGB 9.5* 9.9*  HCT 30.7* 29.0*  MCV 91.6  --   PLT 177  --    Basic Metabolic Panel:  Recent Labs Lab 01/29/17 0245 01/29/17 0902 02/04/17 0557 02/04/17 0631  NA 145  --  137 137  K 3.5  --  5.1 5.1  CL 106  --  103 105  CO2 25  --  27  --   GLUCOSE 218*  --  195* 197*  BUN <5*  --  20 26*  CREATININE 0.65  --  0.93 0.90  CALCIUM 9.8  --  10.8*  --   MG 1.6* 2.0  --   --    GFR: Estimated Creatinine Clearance: 47.9 mL/min (by C-G formula based on SCr of 0.9 mg/dL). Liver Function Tests:  Recent Labs Lab 02/04/17 0557  AST 156*  ALT 135*  ALKPHOS 460*  BILITOT 1.5*  PROT 6.2*  ALBUMIN 2.2*     No results for input(s): LIPASE, AMYLASE in the last 168 hours. No results for input(s): AMMONIA in the last 168 hours. Coagulation Profile: No results for input(s): INR, PROTIME in the last 168 hours. Cardiac Enzymes:  Recent Labs Lab 02/04/17 0557  TROPONINI 3.91*   BNP (last 3 results) No results for input(s): PROBNP in the last 8760 hours. HbA1C: No results for input(s): HGBA1C in the last 72 hours. CBG:  Recent Labs Lab 01/30/17 2004 01/30/17 2336 01/31/17 0414 01/31/17 0754 01/31/17 1248  GLUCAP 231* 213* 218* 206* 171*   Lipid Profile: No results for input(s): CHOL, HDL, LDLCALC, TRIG, CHOLHDL, LDLDIRECT in the last 72 hours. Thyroid Function Tests: No results for input(s): TSH, T4TOTAL, FREET4, T3FREE, THYROIDAB in the last 72 hours. Anemia Panel: No results for input(s): VITAMINB12, FOLATE, FERRITIN, TIBC, IRON, RETICCTPCT in the last 72 hours. Urine analysis:    Component Value Date/Time   COLORURINE AMBER (A) 02/04/2017 0706   APPEARANCEUR CLEAR 02/04/2017 0706   LABSPEC 1.023 02/04/2017 0706   PHURINE 7.0 02/04/2017 0706   GLUCOSEU 50 (A) 02/04/2017 0706   HGBUR NEGATIVE 02/04/2017 0706   BILIRUBINUR NEGATIVE 02/04/2017 0706   KETONESUR NEGATIVE 02/04/2017 0706   PROTEINUR 30 (A) 02/04/2017 0706   NITRITE NEGATIVE 02/04/2017 0706   LEUKOCYTESUR NEGATIVE 02/04/2017 0706    Creatinine Clearance: Estimated Creatinine Clearance: 47.9 mL/min (by C-G formula based on SCr of 0.9 mg/dL).  Sepsis Labs: @LABRCNTIP (procalcitonin:4,lacticidven:4) )No results found for this or any previous visit (from the past 240 hour(s)).   Radiological Exams on Admission: Ct Head Wo Contrast  Result Date: 02/04/2017 CLINICAL DATA:  Sepsis.  Unresponsive for several days. EXAM: CT HEAD WITHOUT CONTRAST TECHNIQUE: Contiguous axial images were obtained from the base of the skull through the vertex without intravenous contrast. COMPARISON:  01/31/2017 FINDINGS: Brain:  Diffuse cerebral atrophy. Mild ventricular dilatation consistent with central atrophy. Low-attenuation changes in the deep white matter with old lacunar infarcts consistent with small vessel ischemic change. Area of developing encephalomalacia in the right occipital lobe and right greater than left cerebellar hemispheres consistent with evolving infarcts. There is increased low-attenuation change in sulcal effacement in the left occipital lobe since previous study suggesting developing subacute infarct. The area of involvement appears  more prominent than on the recent MRI. No midline shift. No abnormal extra-axial fluid collections. Gray-white matter junctions are distinct. Basal cisterns are not effaced. No acute intracranial hemorrhage. Vascular: Vascular calcifications are present. Skull: Calvarium appears intact. Sinuses/Orbits: Visualized paranasal sinuses and mastoid air cells are not opacified. Other: None. IMPRESSION: Developing low-attenuation and sulcal effacement in the left occipital lobe suggesting progressing acute infarct. Expected evolutionary changes of previous infarct in the right occipital region and bilateral cerebellum. No acute intracranial hemorrhage or significant mass effect. Electronically Signed   By: Lucienne Capers M.D.   On: 02/04/2017 06:42   Dg Chest Port 1 View  Result Date: 02/04/2017 CLINICAL DATA:  Sepsis, shortness of breath. History of colon cancer, recent for percutaneous gastrostomy tube placement. EXAM: PORTABLE CHEST - 1 VIEW; PORTABLE ABDOMEN - 1 VIEW COMPARISON:  CT abdomen and pelvis January 25, 2017 inch chest radiograph January 25, 2017 FINDINGS: Cardiac silhouette is upper limits of normal, mediastinal silhouette is nonsuspicious for this low inspiratory examination with crowded vasculature markings. Patient rotated to the RIGHT, unfurling the aorta. RIGHT mid lung zone bandlike density. Pleural effusions or focal consolidations. Trachea projects midline and there is  no pneumothorax. Tracheostomy tube tip projects 3 cm above the carina. Included soft tissue planes and osseous structures are non-suspicious. Paucity of bowel gas on this single upright view. Single gas-filled nondistended loops below bowel in the LEFT lower quadrant. No intra-abdominal mass effect or pathologic calcifications. No intraperitoneal free air. Soft tissue planes and included osseous structures are nonsuspicious. IMPRESSION: RIGHT midlung zone atelectasis/scarring. Borderline cardiomegaly. Nonspecific bowel gas pattern. Electronically Signed   By: Elon Alas M.D.   On: 02/04/2017 06:18   Dg Abd Portable 1 View  Result Date: 02/04/2017 CLINICAL DATA:  Sepsis, shortness of breath. History of colon cancer, recent for percutaneous gastrostomy tube placement. EXAM: PORTABLE CHEST - 1 VIEW; PORTABLE ABDOMEN - 1 VIEW COMPARISON:  CT abdomen and pelvis January 25, 2017 inch chest radiograph January 25, 2017 FINDINGS: Cardiac silhouette is upper limits of normal, mediastinal silhouette is nonsuspicious for this low inspiratory examination with crowded vasculature markings. Patient rotated to the RIGHT, unfurling the aorta. RIGHT mid lung zone bandlike density. Pleural effusions or focal consolidations. Trachea projects midline and there is no pneumothorax. Tracheostomy tube tip projects 3 cm above the carina. Included soft tissue planes and osseous structures are non-suspicious. Paucity of bowel gas on this single upright view. Single gas-filled nondistended loops below bowel in the LEFT lower quadrant. No intra-abdominal mass effect or pathologic calcifications. No intraperitoneal free air. Soft tissue planes and included osseous structures are nonsuspicious. IMPRESSION: RIGHT midlung zone atelectasis/scarring. Borderline cardiomegaly. Nonspecific bowel gas pattern. Electronically Signed   By: Elon Alas M.D.   On: 02/04/2017 06:18    EKG: Independently reviewed. Sinus rhythm Short PR interval  Abnormal R-wave progression, early transition Inferior infarct, age indeterminate  Assessment/Plan Principal Problem:   SIRS (systemic inflammatory response syndrome) (HCC) Active Problems:   Hypertension   Elevated troponin   Type 2 diabetes mellitus with hyperglycemia, with long-term current use of insulin (HCC)   Acute on chronic respiratory failure ([place]) s/p Feinstein Tracheostomy 01/16/17   Metastatic adenocarcinoma to liver ([place])   Anemia of critical lillness   Tracheostomy status ([place])   Fever   Encephalopathy   Stroke ([place])    #1. SIRS. Fever, leukocytosis, elevated lactic acid, tachypnea, tachycardia,  elevated troponin with normal EKG, worsening LFTs. Code sepsis initiated. Fluids provided broad-spectrum antibiotics initiated. Urine shows  no signs of infection chest x-ray shows no infiltrate currently. suspect respiratory source given AMS in the setting of PEG tube concern for aspiration.  Discussed goals of care with family. Reviewed current/worsening condition. Family agreed to limited code initially and then agreed to comfort care after speaking with dr Marily Memos -Follow blood cultures -Continue antibiotics -end of life protocol with fentanyl drip -palliative care consult  #2. Acute on chronic respiratory failure. Status post tracheostomy last hospitalization. Also treated for aspiration pneumonia last hospitalization No oxygen needs at home. Trach collar Patient with tachypnea oxygen saturation level 92% with venti mask. X-ray without infiltrate. -oxygen supplementation for comfort -comfort care only  3. Acute on chronic encephalopathy. Likely related to acute stroke with extended stroke in setting of possible aspiration pna and MI.  Patient discharged 4 days ago after being hospitalized for stroke as noted above. Last hospitalization underwent mechanical thrombectomy. CT today shows progressing acute infarct on left and evolutionary changes of previous infarct on right. Recent  echo with an EF of 60-65%. Recent carotid Doppler with right stenosis.  She is evaluated by neurology who recommends CTA head -comfort care -no further work up at this time   #4. Elevated troponin concern for myocardial infarction. EKG as noted above. May be related to demand -cycle troponin -comfort care  5. Hypertension. Blood pressure the low end of normal upon presentation. Home medications include losartan. -We'll hold losartan for now -IV fluids as noted above -Monitor  #6. Diabetes. Serum glucose 197 on admission. Recent hemoglobin A1c 7.2 -Sliding scale insulin every 4 hours -hold lantus  #7. Metastatic adenocarcinoma to the liver. Increase in LGT   DVT prophylaxis: lovenox Code Status: dnr  Family Communication: daughters and husband at bedside  Disposition Plan: dnr hospice  Consults called: vega with neuro Admission status: inpatient    Radene Gunning MD [facility]  If 7PM-7AM, please contact night-coverage [email or web address] Password [email or web address]  02/04/2017, 8:50 AM

## 2017-02-04 NOTE — Consult Note (Signed)
Neurology Consultation Reason for Consult: New Stroke Referring Physician: ED  CC: poorly responsive  History is obtained from: patient's daughters  HPI: April Franklin is a 76 y.o. female who was recently cared for by the stroke service due to a basilar thrombus.  She is also known to have a history of metastatic cancer and a prior DVT.  During last admission last month, she underwent a basilar thrombectomy, and was sent home on lovenox for a right leg DVT.  Her new baseline after the stroke unfortunately was very poor and patient ended bed-bound, trached and pegged.  She was ultimately discharged to home care and her daughters have been caring for her.  At best she could utter a couple of words and open her eyes.  In this setting, she became less interactive since last Friday.  Family noted an evolving cough and some discomfort and today, when they attempted to feed her she had "brown stuff" coming out of her feeding tube.  Thus, family decided to bring patient to the hospital.  CT head in the ED showed a new left PCA stroke affecting the occipital lobe and we were called to consult.  Unfortunately I cannot obtain further specifics from the pt as she is not currently able to interact with me.   ROS:  Unable to obtain due to altered mental status.   Past Medical History:  Diagnosis Date  . Arthritis   . Colon cancer ([place])   . Diabetes mellitus   . Hyperlipidemia   . Hypertension   . UTI (urinary tract infection)     History reviewed. No pertinent family history.  Social History:  reports that she has never smoked. She has never used smokeless tobacco. She reports that she does not drink alcohol or use drugs.  Exam: Current vital signs: BP 127/62   Pulse 90   Temp 100.2 F (37.9 C) (Rectal)   Resp (!) 36   Ht 5\' 2"  (1.575 m)   Wt 63 kg (139 lb)   SpO2 97%   BMI 25.42 kg/m  Vital signs in last 24 hours: Temp:  [100 F (37.8 C)-100.2 F (37.9 C)] 100.2 F (37.9 C) (03/13  0549) Pulse Rate:  [90-111] 90 (03/13 0748) Resp:  [16-42] 36 (03/13 0748) BP: (96-138)/(58-79) 127/62 (03/13 0748) SpO2:  [93 %-99 %] 97 % (03/13 0748) FiO2 (%):  [30 %-40 %] 30 % (03/13 0651) Weight:  [63 kg (139 lb)] 63 kg (139 lb) (03/13 0541)   Physical Exam  Constitutional: Appears well-developed and well-nourished.  Psych: Affect appropriate to situation Eyes: No scleral injection HENT: No OP obstrucion Head: Normocephalic.  Cardiovascular: Normal rate and regular rhythm.  Respiratory: Effort normal and breath sounds normal to anterior ascultation GI: Soft.  No distension. There is no tenderness.  Skin: WDI  Neuro: Mental Status: Patient keeps eyes closed and appears somewhat uncomfortable, coughing.  Does not respond to voice but does not some extent  To pain. Cranial Nerves: II: Unable to test III,IV, VI: gaze appears conjugate but cannot fully assess V: There is an obvious left sided facial droop VII: Facial movement is not symmetric.  VIII: unable to test X: unable to test XI: unable to test QAS:TMHDQQ to test Motor: She is flaccid. No movement is seen in any extremity.  Minimal attempts to withdraw to pain from both feet Sensory: Minimal grimace to pain Deep Tendon Reflexes: Unable to test Plantars: Toes are equivocal Cerebellar: Unable to test    I have  reviewed labs in epic and the results pertinent to this consultation are: Preserved renal function.    I have reviewed the images obtained CXR shows RIGHT midlung zone atelectasis/scarring and Borderline.  New CT head shows developing low-attenuation and sulcal effacement in the left occipital lobe suggesting progressing acute infarct and expected evolutionary changes of previous infarct in the right occipital region and bilateral cerebellum. No acute intracranial hemorrhage or significant mass effect.  Troponin is elevated at 4.7   Impression: new subacute left occipital infarct in the setting of  evolving PNA and possible myocardial infarction.  Recommendations: 1)  I am going to order a CTA head as her renal function tolerates this. She is of course not a tPA candidate as it is unclear when her last known well was in reference to the present stroke.  I would continue lovenox and decide whether it will makes sense to add an antiplatelet to her [facility] in she has any intracranial disease.  I will of course defer medical management of pulmonary, cardiac and GI issues to the primary team.  The patient will be passed on to the stroke service later today.

## 2017-02-04 NOTE — ED Notes (Signed)
Dr. Leonides Schanz notified of critical repeat Troponin 3.91

## 2017-02-04 NOTE — ED Triage Notes (Signed)
Pt brought to ED by [facility] from home mostly unresponsive, pt only responding to pain, per family pt was having fever all day long yesterday they treated her with Tylenol, been AMS since last Thursday with a GCS of 3, significant amount of brown secretion coming through her G tube per family and EMS. VS for EMS SBP 98, R 28-30, Temp 105, HR 115 SPO2 90% on 6L through the trach. CBG 155.

## 2017-02-04 NOTE — ED Notes (Signed)
Palliative care RN in for patient assessment and to speak with family

## 2017-02-04 NOTE — ED Notes (Signed)
RT at bedside to perform trach care.

## 2017-02-04 NOTE — Progress Notes (Signed)
Pharmacy Antibiotic Note  April Franklin is a 76 y.o. female admitted on 02/04/2017 with sepsis.  Pharmacy has been consulted for Vancomycin and Zosyn dosing. Noted pt with recent admission (12/2016) for acute encephalopathy.  Plan: Zosyn 3.375gm IV now over 30 min then 3.375gm IV q8h - subsequent doses over 4 hours Vancomycin 1gm now then 500mg  I Vq12h Will f/u micro data, renal function, and pt's clinical condition Vanc trough prn   Height: 5\' 2"  (157.5 cm) Weight: 139 lb (63 kg) IBW/kg (Calculated) : 50.1  Temp (24hrs), Avg:100.1 F (37.8 C), Min:100 F (37.8 C), Max:100.2 F (37.9 C)   Recent Labs Lab 01/29/17 0245  CREATININE 0.65    Estimated Creatinine Clearance: 53.9 mL/min (by C-G formula based on SCr of 0.65 mg/dL).    No Known Allergies  Antimicrobials this admission: 3/13 Vanc >>  3/13 Zosyn >>   Dose adjustments this admission: n/a  Microbiology results: 3/13 BCx x2:   UCx:    Thank you for allowing pharmacy to be a part of this patient's care.  Sherlon Handing, PharmD, BCPS Clinical pharmacist, pager [phone] 02/04/2017 5:58 AM

## 2017-02-04 NOTE — Progress Notes (Signed)
Palliative Medicine RN Note: Consult order noted. Discussed April Franklin with Dr Barbaraann Faster. Met with April Franklin's daughter and husband. April Franklin was not responsive to voice during my assessment. Unable to palpate pedal pulses. Leakage from PEG is thin, rust colored, foul smelling. Trach collar in place. Family reports that April Franklin had eyes open and was moving her legs, but this was not witnessed by staff.   Per Dr Barbaraann Faster, April Franklin will be admitted for IV fluids and abx and will have fentanyl started. Plan with family to meet with Dr Domingo Cocking with PMT tomorrow am at 0900 to f/u on how April Franklin does overnight with treatments.  Marjie Skiff Grayson Pfefferle, RN, BSN, [facility] 02/04/2017 1:44 PM Cell [phone] 8:00-4:00 Monday-Friday Office [phone]

## 2017-02-04 NOTE — ED Notes (Signed)
Pt's daughter questioning substance in G-tube.  Explained to her that this would have to be discussed with MD.  Pt's daughter st's she would like to know if the G-tube is where it is supposed to be.  Discussed this with Dr. Kathrynn Humble.   Dr. Jaclynn Major in to talk to family.  Abd x-ray ordered.

## 2017-02-05 DIAGNOSIS — R5081 Fever presenting with conditions classified elsewhere: Secondary | ICD-10-CM

## 2017-02-05 DIAGNOSIS — Z930 Tracheostomy status: Secondary | ICD-10-CM

## 2017-02-05 DIAGNOSIS — C787 Secondary malignant neoplasm of liver and intrahepatic bile duct: Secondary | ICD-10-CM

## 2017-02-05 DIAGNOSIS — R748 Abnormal levels of other serum enzymes: Secondary | ICD-10-CM

## 2017-02-05 DIAGNOSIS — I82431 Acute embolism and thrombosis of right popliteal vein: Secondary | ICD-10-CM

## 2017-02-05 DIAGNOSIS — J9621 Acute and chronic respiratory failure with hypoxia: Secondary | ICD-10-CM

## 2017-02-05 DIAGNOSIS — D649 Anemia, unspecified: Secondary | ICD-10-CM

## 2017-02-05 DIAGNOSIS — Z515 Encounter for palliative care: Secondary | ICD-10-CM

## 2017-02-05 DIAGNOSIS — Z7189 Other specified counseling: Secondary | ICD-10-CM

## 2017-02-05 DIAGNOSIS — R6510 Systemic inflammatory response syndrome (SIRS) of non-infectious origin without acute organ dysfunction: Secondary | ICD-10-CM

## 2017-02-05 DIAGNOSIS — G9340 Encephalopathy, unspecified: Secondary | ICD-10-CM

## 2017-02-05 DIAGNOSIS — Z931 Gastrostomy status: Secondary | ICD-10-CM

## 2017-02-05 DIAGNOSIS — E118 Type 2 diabetes mellitus with unspecified complications: Secondary | ICD-10-CM

## 2017-02-05 DIAGNOSIS — R7989 Other specified abnormal findings of blood chemistry: Secondary | ICD-10-CM

## 2017-02-05 LAB — URINE CULTURE: Culture: NO GROWTH

## 2017-02-05 LAB — GLUCOSE, CAPILLARY
GLUCOSE-CAPILLARY: 223 mg/dL — AB (ref 65–99)
GLUCOSE-CAPILLARY: 180 mg/dL — AB (ref 65–99)
GLUCOSE-CAPILLARY: 143 mg/dL — AB (ref 65–99)
Glucose-Capillary: 159 mg/dL — ABNORMAL HIGH (ref 65–99)

## 2017-02-05 MED ORDER — ENOXAPARIN SODIUM 40 MG/0.4ML ~~LOC~~ SOLN
40.0000 mg | SUBCUTANEOUS | Status: DC
  Administered 2017-02-05: 40 mg via SUBCUTANEOUS
  Filled 2017-02-05: qty 0.4

## 2017-02-05 MED ORDER — FENTANYL CITRATE (PF) 100 MCG/2ML IJ SOLN
12.5000 ug | INTRAMUSCULAR | Status: DC | PRN
Start: 2017-02-05 — End: 2017-02-11

## 2017-02-05 MED ORDER — ASPIRIN 81 MG PO CHEW
81.0000 mg | CHEWABLE_TABLET | Freq: Every day | ORAL | Status: DC
Start: 2017-02-05 — End: 2017-02-06

## 2017-02-05 NOTE — Progress Notes (Signed)
STROKE TEAM PROGRESS NOTE   HISTORY OF PRESENT ILLNESS (per record) April Franklin is a 76 y.o. female who was recently cared for by the stroke service due to a basilar thrombus.  She is also known to have a history of metastatic cancer and a prior DVT.  During admission last month, she underwent a basilar thrombectomy, and was sent home on lovenox for a right leg DVT.  Her new baseline after the stroke unfortunately was very poor and patient ended bed-bound, trached and pegged.  She was ultimately discharged to home care and her daughters have been caring for her.  At best she could utter a couple of words and open her eyes.  In this setting, she became less interactive since last Friday.  Family noted an evolving cough and some discomfort and today, when they attempted to feed her she had "brown stuff" coming out of her feeding tube.  Thus, family decided to bring patient to the hospital.  CT head in the ED showed a new left PCA stroke affecting the occipital lobe and we were called to consult.  Unfortunately Dr. Wendee Franklin cannot obtain further specifics from the pt as she is not currently able to interact. Patient was not administered IV t-PA secondary to recent stroke, on lovenox. She was admitted for further evaluation and treatment.   SUBJECTIVE (INTERVAL HISTORY) Daughter is at bedside. Discussed with her about palliative care which I think is very appropriate and reasonable for this pt. Daughter stated that they will have Friday meeting with Dr. Domingo Franklin and will decide at that time. Pt daughter understood and leaning towards comfort care at this time but she said she has to talk with other family members especially his brother.    OBJECTIVE Temp:  [98.8 F (37.1 C)-99.1 F (37.3 C)] 99.1 F (37.3 C) (03/14 0444) Pulse Rate:  [89-109] 90 (03/14 1145) Resp:  [16-41] 16 (03/14 1145) BP: (112-143)/(54-80) 115/54 (03/14 0444) SpO2:  [93 %-99 %] 95 % (03/14 1145) FiO2 (%):  [28 %-30 %] 28 % (03/14  1145) Weight:  [65.2 kg (143 lb 11.2 oz)] 65.2 kg (143 lb 11.2 oz) (03/13 1846)  CBC:  Recent Labs Lab 02/04/17 0557 02/04/17 0631  WBC 14.4*  --   NEUTROABS 11.7*  --   HGB 9.5* 9.9*  HCT 30.7* 29.0*  MCV 91.6  --   PLT 177  --     Basic Metabolic Panel:  Recent Labs Lab 02/04/17 0557 02/04/17 0631  NA 137 137  K 5.1 5.1  CL 103 105  CO2 27  --   GLUCOSE 195* 197*  BUN 20 26*  CREATININE 0.93 0.90  CALCIUM 10.8*  --     IMAGING I have personally reviewed the radiological images below and agree with the radiology interpretations.  Ct Head Wo Contrast 02/04/2017 Developing low-attenuation and sulcal effacement in the left occipital lobe suggesting progressing acute infarct. Expected evolutionary changes of previous infarct in the right occipital region and bilateral cerebellum. No acute intracranial hemorrhage or significant mass effect.    PHYSICAL EXAM Temp:  [98.8 F (37.1 C)-99.1 F (37.3 C)] 99.1 F (37.3 C) (03/14 0444) Pulse Rate:  [89-108] 91 (03/14 1515) Resp:  [16-36] 18 (03/14 1515) BP: (115-137)/(54-80) 115/54 (03/14 0444) SpO2:  [93 %-99 %] 95 % (03/14 1515) FiO2 (%):  [28 %-30 %] 28 % (03/14 1515) Weight:  [143 lb 11.2 oz (65.2 kg)] 143 lb 11.2 oz (65.2 kg) (03/13 1846)   Physical exam is very  limited due to partial comfort care status  General - cachectic, well developed, poorly responsive.  Ophthalmologic - Fundi not visualized.  Cardiovascular - Regular rate and rhythm.  Neuro - poorly responsive, eyes closed, not open to voice. Spontaneous breathing, on trach collar, intermittent cough with secretions. Moving both LEs intermittently, at least 3-/5, did not move UEs during encounter. Rest of the exam is limited due to partial comfort care status   ASSESSMENT/PLAN [name] is a 76 y.o. female with history of recent stroke admission with BA occlusion s/p revasuclarization (B MCA, R>L cerebellar infarcts) felt to be hypercoagulable  from metastatic colon cancer, HTN, HLD, DM, acute DVTdischarged home with family as they refused SNF now presenting with decreased responsiveness, PEG tube with brown discharge and cough. At baseline she was poorly interactive.  She did not receive IV t-PA due to recent stroke, on lovenox for DVT. CT head showed acute left occipital infarct.  Stroke:  New L occipital lobe infarct in setting of B MCA and R>L cerebellar infarcts last month. Current and previous infarcts embolic secondary to hypercoagulable state from metastatic colon cancer   CT  New L occipital lobe infarct. Evolution of previous R occipital and B cerebellar infarcts  No further stroke workup indicated  PEG in place, NPO  full dose lovenox for acute DVT in setting of DVT and colon cancer prior to admission, now on ASA 81mg  and prophylaxis dose of lovenox  Patient had home health through [place], SW, PT, OT, ST  Overall, neuro prognosis remains grim. Palliative care asked to reconsult - family was not receptive during last admission.   Discussed with daughter today about palliative care which I think is very appropriate and reasonable for this pt. Pt daughter understood and leaning towards comfort care at this time but she said she has to talk with other family members especially his brother. They are looking forward to a Friday meeting with Dr. Domingo Franklin and will decide at that time.    Disposition:  pending   ? UGIB  Daughter stated coffee ground color material coming out from PEG yesterday  TF on hold  lovenox therapeutic treatment discontinued  On lovenox prophylaxis dose  However, Hb stable and actually improved from last discharge  NSTEMI  Elevated troponin  Cardiology on board  On ASA 81mg   Not recommend invasive therapy  SIRS  Fever, leukocytosis, increased HR, RR  On zosyn and vanco  Right LE DVT - likely due to hypercoagulable state secondary to metastatic colon cancer  On  lovenox bid PTA  Therapeutic lovenox discontinued due to concern of UGIB  Recommend comfort care measures  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 s/p liver bx 12/30/16  Likely the source of hypercoagulable state  Recommend comfort care measures  Hypertension  Stable   Hyperlipidemia  Home meds:  No statin d/t liver mets  Dysphagia  Secondary to stroke  PEG placed during last admission, replaced with foley when became dislodged  Confirmed placement with xray this admission  TF on hold due to concerning of UGIB  Recommend comfort care measures  Diabetes  HgbA1c 7.2, goal < 7.0  Home on insulin  SSI  Other Stroke Risk Factors  Advanced age  Hx stroke/TIA  Other Active Problems  Acute on chronic respiratory failure  Acute on chronic encephalopathy  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 Pt daughter understood and leaning towards comfort care at this time but she said she has to talk with other family members especially his brother. Family will have Friday meeting with Dr. Domingo Franklin and will decide at that time. I also discussed with Dr. [name]. Recommend comfort care.  Neurology will follow in distance but will be available for any further questions. Thanks for the consult.  Rosalin Hawking, MD PhD Stroke Neurology 02/05/2017 5:58 PM  To contact Stroke Continuity provider, please refer to [email or web address] After hours, contact General Neurology

## 2017-02-05 NOTE — Progress Notes (Signed)
[facility]  Patient Status: Active (receiving services up to time of hospitalization)  [facility] is providing the following services: RN, PT, OT, ST and MSW  If patient discharges after hours, please call [phone].   April Franklin 02/05/2017, 10:42 AM

## 2017-02-05 NOTE — Consult Note (Signed)
Consultation Note Date: 02/05/2017   Patient Name: April Franklin  DOB: 1941-03-16  MRN: [id number]  Age / Sex: 76 y.o., female  PCP: [name], MD Referring Physician: Donne Hazel, MD  Reason for Consultation: Establishing goals of care  HPI/Patient Profile: 76 y.o. female  with past medical history of adenocarcinoma with metastases to the liver, hypertension, hyperlipidemia, recent cerebellar embolism with cerebral infarction on the right and bilateral cerebellar infarcts on Lovenox, hypertension, diabetes, chronic respiratory failure status post trach from last hospitalization, status post PEG tube from last hospitalization admitted on 02/04/2017 with SIRS, resp failure, and new CVA.   Clinical Assessment and Goals of Care:  I met today with April Franklin family including her husband and three of her daughters.  She has 5 children total.   The reports the most important thing to her is her family and her faith (she is devout [employer]).  Her family also reports that they have a good understanding of her conditions and the doctors have been doing a good job explaining things to them.  We discussed clinical course as well as wishes moving forward.  We specifically reviewed her multiple advanced comorbid conditions including metastatic cancer, recent stroke complicated by another stroke this admission, and NSTEMI  Concepts specific to code status and current care during hospitalization discussed.    We discussed difference between a aggressive medical intervention path and a palliative, comfort focused care path.  Values and goals of care important to patient and family were attempted to be elicited.  Her family is clear that they place her faith in God that he will care for April Franklin as she reaches the end of her life. They're still hopeful for a miracle and therefore are not wanting to  transition to comfort care at this time.  Plan for no escalation of care and continue current therapy      SUMMARY OF RECOMMENDATIONS   - DNR/DNI - Her family is still struggling emotionally with the fact that she is reaching the end of her life. They're able to verbalize back to me understanding that her medical team is concerned that she is dying, however, they report having a strong faith that God will heal her if it is His will.  Initially I met with 3 daughters and the patient's husband. They were all clear that goal is not strict comfort care (though they do want her to be comfortable and receive medications for comfort as needed regardless of side effects) and family desires to continue with current therapies for another 48 hours to see if she responds to gentle medical therapy. We also discussed limitation of care to current interventions without a plan to escalate to ICU level interventions as her condition continues to deteriorate.   - Her family was concerned about leakage around G-tube and had discussed removing it. I met again with her family including her son, daughters, and husband. We discussed option for removing feeding tube with understanding this is not something that would be replaced.  Her family then reports that they misunderstood regarding possibility of removing feeding tube and this is not something that they want to do at this time. - Her daughters and husband seemed open to the fact that she is likely reaching the end of her life but want to continue with current therapies (" God will take her when it is her time").  When I met later with her son in the room also, he was adamant that he does not feel that she is reaching end of life and feel strongly we need to continue with current therapies "to give her a chance." - Plan for follow up meeting on Friday.  Code Status/Advance Care Planning:  DNR   Symptom Management:   Pain/shortness of breath: Initially, fentanyl infusion  had been ordered on admission. Family had refused this to after she arrived to the floor. She is currently not receiving any opioids. I discussed again the goal of medications to control pain and shortness of breath. Will plan for initiation of fentanyl as needed. Family is opposed to using morphine at any point moving forward. We discussed nonverbal indicators of distress and reasons for using opioids to control pain or shortness of breath.  Otherwise, symptom management per order set ordered on admission.  Palliative Prophylaxis:   Aspiration, Delirium Protocol and Frequent Pain Assessment  Additional Recommendations (Limitations, Scope, Preferences):  Continue current therapies. No escalation of care to ICU level care in the event decompensation.  Psycho-social/Spiritual:   Desire for further Chaplaincy support: Did not address today  Additional Recommendations: Education on Hospice  Prognosis:   Unable to determine  Discharge Planning: To Be Determined      Primary Diagnoses: Present on Admission: . Elevated troponin . Fever . Hypertension . Metastatic adenocarcinoma to liver ([place]) . SIRS (systemic inflammatory response syndrome) ([facility]) . Acute on chronic respiratory failure ([place]) s/p Feinstein Tracheostomy 01/16/17 . Encephalopathy . Anemia of critical lillness . Stroke [facility])   I have reviewed the medical record, interviewed the patient and family, and examined the patient. The following aspects are pertinent.  Past Medical History:  Diagnosis Date  . Arthritis   . Colon cancer ([place])   . Diabetes mellitus   . Hyperlipidemia   . Hypertension   . UTI (urinary tract infection)    Social History   Social History  . Marital status: Married    Spouse name: N/A  . Number of children: N/A  . Years of education: N/A   Social History Main Topics  . Smoking status: Never Smoker  . Smokeless tobacco: Never Used  . Alcohol use No  . Drug use: No  . Sexual  activity: No   Other Topics Concern  . None   Social History Narrative  . None   Family History  Problem Relation Age of Onset  . Diabetes Mother   . CAD Father    Scheduled Meds: . guaiFENesin  1,200 mg Oral BID  . insulin aspart  0-15 Units Subcutaneous TID WC  . insulin aspart  0-5 Units Subcutaneous QHS  . piperacillin-tazobactam (ZOSYN)  IV  3.375 g Intravenous Q8H  . vancomycin  500 mg Intravenous Q12H   Continuous Infusions: . sodium chloride 50 mL/hr at 02/05/17 0600   PRN Meds:.acetaminophen **OR** acetaminophen (TYLENOL) oral liquid 160 mg/5 mL **OR** acetaminophen, antiseptic oral rinse, chlorproMAZINE (THORAZINE) IV, diphenhydrAMINE, fentaNYL (SUBLIMAZE) injection, glycopyrrolate **OR** glycopyrrolate **OR** glycopyrrolate, haloperidol **OR** haloperidol **OR** haloperidol lactate, ipratropium-albuterol, LORazepam **OR** LORazepam **OR** LORazepam, LORazepam, ondansetron **OR** ondansetron (  ZOFRAN) IV, polyvinyl alcohol Medications Prior to Admission:  Prior to Admission medications   Medication Sig Start Date End Date Taking? Authorizing Provider  chlorhexidine (PERIDEX) 0.12 % solution 15 mLs by Mouth Rinse route 2 (two) times daily. 01/31/17  Yes David L Rinehuls, PA-C  docusate (COLACE) 50 MG/5ML liquid Place 5 mLs (50 mg total) into feeding tube at bedtime. 01/31/17  Yes David L Rinehuls, PA-C  enoxaparin (LOVENOX) 60 MG/0.6ML injection Inject 0.6 mLs (60 mg total) into the skin every 12 (twelve) hours. 01/31/17  Yes David L Rinehuls, PA-C  insulin aspart (NOVOLOG) 100 UNIT/ML injection Inject 8 Units into the skin every 4 (four) hours. 01/31/17  Yes David L Rinehuls, PA-C  insulin glargine (LANTUS) 100 UNIT/ML injection Inject 0.2 mLs (20 Units total) into the skin at bedtime. 01/31/17  Yes David L Rinehuls, PA-C  losartan (COZAAR) 50 MG tablet Take 50 mg by mouth daily.   Yes Historical Provider, MD  magnesium hydroxide (MILK OF MAGNESIA) 400 MG/5ML suspension Place 30 mLs  into feeding tube daily as needed for mild constipation. 01/31/17  Yes David L Rinehuls, PA-C  potassium chloride 20 MEQ/15ML (10%) SOLN Place 22.5 mLs (30 mEq total) into feeding tube 2 (two) times daily. 01/31/17  Yes David L Rinehuls, PA-C  sennosides (SENOKOT) 8.8 MG/5ML syrup Place 5 mLs into feeding tube at bedtime. 01/31/17  Yes David L Rinehuls, PA-C  sodium phosphate (FLEET) 7-19 GM/118ML ENEM Place 133 mLs (1 enema total) rectally daily as needed for severe constipation. 01/31/17  Yes David L Rinehuls, PA-C  Water For Irrigation, Sterile (FREE WATER) SOLN Place 240 mLs into feeding tube every 6 (six) hours. 01/31/17  Yes David L Rinehuls, PA-C  pantoprazole sodium (PROTONIX) 40 mg/20 mL PACK Place 20 mLs (40 mg total) into feeding tube daily. 02/01/17   David L Rinehuls, PA-C   No Known Allergies Review of Systems Unable to obtain  Physical Exam  General: Somnolent, does not respond to verbal or tactile stimulation, in no acute distress.  HEENT: No bruits, no goiter, no JVD, tracheostomy in place Heart: Regular rate and rhythm. No murmur appreciated. Lungs: Fair air movement, scattered crackles Abdomen: Soft, nontender, mild distended, PEG tube in place with brown discharge noted. positive bowel sounds.  Ext: No significant edema Skin: Warm and dry Neuro: Unresponsive   Vital Signs: BP (!) 115/54 (BP Location: Right Arm)   Pulse 91   Temp 99.1 F (37.3 C) (Axillary)   Resp 18   Ht 5' 2"  (1.575 m)   Wt 65.2 kg (143 lb 11.2 oz)   SpO2 95%   BMI 26.28 kg/m  Pain Assessment: CPOT   Pain Score: 0-No pain   SpO2: SpO2: 95 % O2 Device:SpO2: 95 % O2 Flow Rate: .O2 Flow Rate (L/min): 6 L/min  IO: Intake/output summary:  Intake/Output Summary (Last 24 hours) at 02/05/17 1546 Last data filed at 02/05/17 0600  Gross per 24 hour  Intake              400 ml  Output                0 ml  Net              400 ml    LBM: Last BM Date: 02/04/17 Baseline Weight: Weight: 63 kg (139  lb) Most recent weight: Weight: 65.2 kg (143 lb 11.2 oz)     Palliative Assessment/Data:   Flowsheet Rows   Flowsheet Row Most Recent Value  Intake Tab  Referral Department  Hospitalist  Unit at Time of Referral  ER  Palliative Care Primary Diagnosis  Neurology  Date Notified  02/04/17  Palliative Care Type  New Palliative care  Reason for referral  End of Life Care Assistance, Clarify Goals of Care  Date of Admission  02/04/17  Date first seen by Palliative Care  02/05/17  # of days IP prior to Palliative referral  0  Clinical Assessment  Palliative Performance Scale Score  10%  Pain Max last 24 hours  Not able to report  Pain Min Last 24 hours  Not able to report  Psychosocial & Spiritual Assessment  Palliative Care Outcomes  Patient/Family meeting held?  Yes  Who was at the meeting?  husband, 3 daughters, son  Palliative Care Outcomes  Clarified goals of care      Time in: 0830 Time out: 1000 Time Total: 90 minutes Greater than 50%  of this time was spent counseling and coordinating care related to the above assessment and plan.  Signed by: Micheline Rough, MD   Please contact Palliative Medicine Team phone at [phone] for questions and concerns.  For individual provider: See Shea Evans

## 2017-02-05 NOTE — Progress Notes (Signed)
Nutrition Brief Note  Chart reviewed. Pt now transitioning to comfort care.  No further nutrition interventions warranted at this time.  Please re-consult as needed.   Ranisha Allaire A. Kilah Drahos, RD, LDN, CDE Pager: [phone] After hours Pager: [phone]  

## 2017-02-05 NOTE — Progress Notes (Signed)
PROGRESS NOTE    April Franklin  POE:[id number] DOB: 01-07-1941 DOA: 02/04/2017 PCP: [name], MD    Brief Narrative:  76 y.o. 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 Initial evaluation reveals a chest dressing acute infarct and evolutionary changes of previous infarct, elevated troponin, fever.  Information is obtained from the family is at the bedside as patient is nonverbal. She was discharged 4 days ago from the hospital and daughter states she was "quite sleepy at that point". During the 4 days at home she developed intermittent fevers and worsening lethargy. Daughter reports noting leaking around her G-tube when she was "giving her Tylenol for her fever" so he came to the hospital. Does indicate they have been administering medications through her G-tube. They note she's been bedbound since her discharge they deny any diarrhea, any vomiting or indications of discomfort. Do note some increased coughing and secretion production. He reports she is not on oxygen at home. Called EMS he reports a temperature of 102.  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 SIRS. Fever, leukocytosis, elevated lactic  acid, tachypnea, tachycardia,  elevated troponin with normal EKG, worsening LFTs. Code sepsis initiated. Fluids provided broad-spectrum antibiotics initiated. Urine shows no signs of infection chest x-ray shows no infiltrate currently. suspect respiratory source given AMS in the setting of PEG tube concern for aspiration. Family has been made aware of worsening condition. -Follow blood cultures -Continue empiric antibiotics for now -palliative care consulted. Appreciate input. See below  #2. Acute on chronic respiratory failure. Status post tracheostomy last hospitalization. Also treated for aspiration pneumonia last hospitalization No oxygen needs at home. Trach collar in place. Patient with tachypnea oxygen saturation level 92% with venti mask. X-ray without obvious infiltrate on review -stable at present  3. Acute on chronic encephalopathy. Likely related to acute stroke with extended stroke in setting of possible aspiration pna and MI.  Patient discharged 4 days ago after being hospitalized for stroke as noted above. Last hospitalization underwent mechanical thrombectomy. CT on admit demonstrated progressing acute infarct on left and evolutionary changes of previous infarct on right. Recent echo with an EF of 60-65%. Recent carotid Doppler with right stenosis. -Stroke team consulted. Discussed case with Dr. Erlinda Hong. Overall poor prognosis.  #4. Elevated troponin concern for NSTEMI. EKG without ST changes evident. -Troponin has peaked to 5.84 -Most recent trop 4.98 -Discussed with on-call Cardiologist, Dr. Gwenlyn Found, who states that patient is not a candidate for aggressive intervention given severity of CVA and other medical issues. Unable to give beta blocker given soft BP in the setting of worsening CVA. Discussed with Neurology. OK to give ASA  5. Hypertension. Blood pressure the low end of normal upon presentation. Home medications include losartan. -Holding losartan for now -IV fluids as noted  above -Stable at present  #6. Diabetes. Serum  glucose 197 on admission. Recent hemoglobin A1c 7.2 -Sliding scale insulin every 4 hours ordered -holding lantus  #7. Metastatic adenocarcinoma to the liver. Increase in LFT's on admit - Will repeat CMP in AM  #8. End of Life: - Appreciate input by Palliative Care. Discussed case with Dr. Domingo Cocking. Overall poor prognosis. After lengthy family discussion, decision was made for DNR but with no escalation of care. Pt to be re-evaluated in 48hrs. If unchanged or worse, consider comfort at that time.  #9. DVT - LE DVT noted at last admit - Will continue lovenox. Discussed with Neurology, Dr. Erlinda Hong, who states OK to continue.  DVT prophylaxis: Lovenox subQ Code Status: DNR Family Communication: Pt in room, family at bedside Disposition Plan: Uncertain at this time  Consultants:   Palliative Care  Neurology  Procedures:     Antimicrobials: Anti-infectives    Start     Dose/Rate Route Frequency Ordered Stop   02/04/17 2000  vancomycin (VANCOCIN) 500 mg in sodium chloride 0.9 % 100 mL IVPB     500 mg 100 mL/hr over 60 Minutes Intravenous Every 12 hours 02/04/17 0602     02/04/17 1400  piperacillin-tazobactam (ZOSYN) IVPB 3.375 g     3.375 g 12.5 mL/hr over 240 Minutes Intravenous Every 8 hours 02/04/17 0602     02/04/17 0600  piperacillin-tazobactam (ZOSYN) IVPB 3.375 g     3.375 g 100 mL/hr over 30 Minutes Intravenous  Once 02/04/17 0556 02/04/17 0741   02/04/17 0600  vancomycin (VANCOCIN) IVPB 1000 mg/200 mL premix     1,000 mg 200 mL/hr over 60 Minutes Intravenous  Once 02/04/17 0556 02/04/17 0810      Subjective: No complaints  Objective: Vitals:   02/05/17 0444 02/05/17 0909 02/05/17 1145 02/05/17 1515  BP: (!) 115/54     Pulse: 91 95 90 91  Resp: 18 17 16 18   Temp: 99.1 F (37.3 C)     TempSrc: Axillary     SpO2: 95% 93% 95% 95%  Weight:      Height:        Intake/Output Summary (Last 24 hours) at 02/05/17  1740 Last data filed at 02/05/17 0600  Gross per 24 hour  Intake              400 ml  Output                0 ml  Net              400 ml   Filed Weights   02/04/17 0541 02/04/17 1846  Weight: 63 kg (139 lb) 65.2 kg (143 lb 11.2 oz)    Examination:  General exam: Appears calm and comfortable  Respiratory system: Clear to auscultation. Respiratory effort normal. Cardiovascular system: S1 & S2 heard, RRR Gastrointestinal system: Abdomen is nondistended, soft and nontender. No organomegaly or masses felt. Normal bowel sounds heard. Central nervous system: Alert and oriented. No focal neurological deficits. Extremities: Symmetric 5 x 5 power. Skin: No rashes, lesions Psychiatry:Non-verbal. Difficult to assess.  Data Reviewed: I have personally reviewed following labs and imaging studies  CBC:  Recent Labs Lab 02/04/17 0557 02/04/17 0631  WBC 14.4*  --   NEUTROABS 11.7*  --   HGB 9.5* 9.9*  HCT 30.7* 29.0*  MCV 91.6  --   PLT 177  --    Basic Metabolic Panel:  Recent Labs Lab 02/04/17 0557 02/04/17 0631  NA 137 137  K 5.1 5.1  CL 103 105  CO2 27  --   GLUCOSE 195* 197*  BUN 20 26*  CREATININE 0.93 0.90  CALCIUM 10.8*  --    GFR: Estimated Creatinine Clearance: 48.6 mL/min (by C-G formula based on SCr of 0.9 mg/dL). Liver Function Tests:  Recent Labs Lab 02/04/17 0557  AST 156*  ALT 135*  ALKPHOS 460*  BILITOT 1.5*  PROT 6.2*  ALBUMIN 2.2*   No results for input(s): LIPASE, AMYLASE in the last 168 hours. No results for input(s): AMMONIA in the last 168 hours. Coagulation Profile: No results for input(s): INR, PROTIME in the last 168 hours. Cardiac Enzymes:  Recent Labs Lab 02/04/17 0557 02/04/17 1211  TROPONINI 3.91* 4.98*   BNP (last 3 results) No results for input(s): PROBNP in the last 8760 hours. HbA1C: No results for input(s): HGBA1C in the last 72 hours. CBG:  Recent Labs Lab 02/04/17 1223 02/04/17 1841 02/04/17 2249  02/05/17 0830 02/05/17 1151  GLUCAP 187* 183* 208* 223* 159*   Lipid Profile: No results for input(s): CHOL, HDL, LDLCALC, TRIG, CHOLHDL, LDLDIRECT in the last 72 hours. Thyroid Function Tests: No results for input(s): TSH, T4TOTAL, FREET4, T3FREE, THYROIDAB in the last 72 hours. Anemia Panel: No results for input(s): VITAMINB12, FOLATE, FERRITIN, TIBC, IRON, RETICCTPCT in the last 72 hours. Sepsis Labs:  Recent Labs Lab 02/04/17 0607 02/04/17 0859  LATICACIDVEN 2.23* 1.79    Recent Results (from the past 240 hour(s))  Blood Culture (routine x 2)     Status: None (Preliminary result)   Collection Time: 02/04/17  5:40 AM  Result Value Ref Range Status   Specimen Description BLOOD RIGHT ARM  Final   Special Requests AEROBIC BOTTLE ONLY 10ML  Final   Culture NO GROWTH 1 DAY  Final   Report Status PENDING  Incomplete  Blood Culture (routine x 2)     Status: None (Preliminary result)   Collection Time: 02/04/17  5:57 AM  Result Value Ref Range Status   Specimen Description BLOOD RIGHT HAND  Final   Special Requests BOTTLES DRAWN AEROBIC AND ANAEROBIC 5ML  Final   Culture NO GROWTH 1 DAY  Final   Report Status PENDING  Incomplete  Urine culture     Status: None   Collection Time: 02/04/17  7:06 AM  Result Value Ref Range Status   Specimen Description URINE, RANDOM  Final   Special Requests NONE  Final   Culture NO GROWTH  Final   Report Status 02/05/2017 FINAL  Final     Radiology Studies: Ct Head Wo Contrast  Result Date: 02/04/2017 CLINICAL DATA:  Sepsis.  Unresponsive for several days. EXAM: CT HEAD WITHOUT CONTRAST TECHNIQUE: Contiguous axial images were obtained from the base of the skull through the vertex without intravenous contrast. COMPARISON:  01/31/2017 FINDINGS: Brain: Diffuse cerebral atrophy. Mild ventricular dilatation consistent with central atrophy. Low-attenuation changes in the deep white matter with old lacunar infarcts consistent with small vessel  ischemic change. Area of developing encephalomalacia in the right occipital lobe and right greater than left cerebellar hemispheres consistent with evolving infarcts. There is increased low-attenuation change in sulcal effacement in the left occipital lobe since previous study suggesting developing subacute infarct. The area of involvement appears more prominent than on the recent MRI. No midline shift. No abnormal extra-axial fluid collections. Gray-white matter junctions are distinct. Basal cisterns are not effaced. No acute intracranial hemorrhage. Vascular: Vascular calcifications are present. Skull: Calvarium appears intact. Sinuses/Orbits: Visualized paranasal sinuses and mastoid air cells are not opacified. Other: None.  IMPRESSION: Developing low-attenuation and sulcal effacement in the left occipital lobe suggesting progressing acute infarct. Expected evolutionary changes of previous infarct in the right occipital region and bilateral cerebellum. No acute intracranial hemorrhage or significant mass effect. Electronically Signed   By: Lucienne Capers M.D.   On: 02/04/2017 06:42   Dg Abdomen Peg Tube Location  Result Date: 02/04/2017 CLINICAL DATA:  Gastrostomy catheter placement EXAM: ABDOMEN - 1 VIEW COMPARISON:  Study obtained earlier in the day. FINDINGS: Contrast, 50 mL Isovue-300, was injected into a gastrostomy catheter. The gastrostomy catheter is positioned in the gastric antrum. Contrast flows into the stomach without contrast extravasation. Bowel gas pattern is normal. No free air. IMPRESSION: Gastrostomy catheter positioning gastric antrum. No contrast extravasation evident. Electronically Signed   By: Lowella Grip III M.D.   On: 02/04/2017 17:09   Dg Chest Port 1 View  Result Date: 02/04/2017 CLINICAL DATA:  Sepsis, shortness of breath. History of colon cancer, recent for percutaneous gastrostomy tube placement. EXAM: PORTABLE CHEST - 1 VIEW; PORTABLE ABDOMEN - 1 VIEW COMPARISON:  CT  abdomen and pelvis January 25, 2017 inch chest radiograph January 25, 2017 FINDINGS: Cardiac silhouette is upper limits of normal, mediastinal silhouette is nonsuspicious for this low inspiratory examination with crowded vasculature markings. Patient rotated to the RIGHT, unfurling the aorta. RIGHT mid lung zone bandlike density. Pleural effusions or focal consolidations. Trachea projects midline and there is no pneumothorax. Tracheostomy tube tip projects 3 cm above the carina. Included soft tissue planes and osseous structures are non-suspicious. Paucity of bowel gas on this single upright view. Single gas-filled nondistended loops below bowel in the LEFT lower quadrant. No intra-abdominal mass effect or pathologic calcifications. No intraperitoneal free air. Soft tissue planes and included osseous structures are nonsuspicious. IMPRESSION: RIGHT midlung zone atelectasis/scarring. Borderline cardiomegaly. Nonspecific bowel gas pattern. Electronically Signed   By: Elon Alas M.D.   On: 02/04/2017 06:18   Dg Abd Portable 1 View  Result Date: 02/04/2017 CLINICAL DATA:  Sepsis, shortness of breath. History of colon cancer, recent for percutaneous gastrostomy tube placement. EXAM: PORTABLE CHEST - 1 VIEW; PORTABLE ABDOMEN - 1 VIEW COMPARISON:  CT abdomen and pelvis January 25, 2017 inch chest radiograph January 25, 2017 FINDINGS: Cardiac silhouette is upper limits of normal, mediastinal silhouette is nonsuspicious for this low inspiratory examination with crowded vasculature markings. Patient rotated to the RIGHT, unfurling the aorta. RIGHT mid lung zone bandlike density. Pleural effusions or focal consolidations. Trachea projects midline and there is no pneumothorax. Tracheostomy tube tip projects 3 cm above the carina. Included soft tissue planes and osseous structures are non-suspicious. Paucity of bowel gas on this single upright view. Single gas-filled nondistended loops below bowel in the LEFT lower quadrant. No  intra-abdominal mass effect or pathologic calcifications. No intraperitoneal free air. Soft tissue planes and included osseous structures are nonsuspicious. IMPRESSION: RIGHT midlung zone atelectasis/scarring. Borderline cardiomegaly. Nonspecific bowel gas pattern. Electronically Signed   By: Elon Alas M.D.   On: 02/04/2017 06:18    Scheduled Meds: . aspirin  81 mg Per Tube Daily  . enoxaparin (LOVENOX) injection  40 mg Subcutaneous Q24H  . guaiFENesin  1,200 mg Oral BID  . insulin aspart  0-15 Units Subcutaneous TID WC  . insulin aspart  0-5 Units Subcutaneous QHS  . piperacillin-tazobactam (ZOSYN)  IV  3.375 g Intravenous Q8H  . vancomycin  500 mg Intravenous Q12H   Continuous Infusions: . sodium chloride 50 mL/hr at 02/05/17 0600     LOS:  1 day   CHIU, Orpah Melter, MD [facility] Pager [phone]  If 7PM-7AM, please contact night-coverage [email or web address] Password [facility] 02/05/2017, 5:40 PM

## 2017-02-05 NOTE — Care Management Note (Signed)
Case Management Note  Patient Details  Name: April Franklin MRN: [id number] Date of Birth: [birth year]  Subjective/Objective:                    Action/Plan:  Prior to admission patient was at home with family and [place]  Expected Discharge Date:                  Expected Discharge Plan:  [place]  In-House Referral:     Discharge planning Services     Post Acute Care Choice:    Choice offered to:     DME Arranged:    DME Agency:     HH Arranged:    [place] Agency:     Status of Service:     If discussed at [employer], dates discussed:    Additional Comments:  Marilu Favre, RN 02/05/2017, 12:15 PM

## 2017-02-06 LAB — CBC
HEMATOCRIT: 26.2 % — AB (ref 36.0–46.0)
Hemoglobin: 7.9 g/dL — ABNORMAL LOW (ref 12.0–15.0)
MCH: 27.4 pg (ref 26.0–34.0)
MCHC: 29.8 g/dL — AB (ref 30.0–36.0)
MCV: 91.9 fL (ref 78.0–100.0)
Platelets: 130 10*3/uL — ABNORMAL LOW (ref 150–400)
RBC: 2.85 MIL/uL — ABNORMAL LOW (ref 3.87–5.11)
RDW: 16 % — AB (ref 11.5–15.5)
WBC: 6.9 10*3/uL (ref 4.0–10.5)

## 2017-02-06 LAB — BASIC METABOLIC PANEL
Anion gap: 9 (ref 5–15)
BUN: 17 mg/dL (ref 6–20)
CO2: 26 mmol/L (ref 22–32)
Calcium: 9.9 mg/dL (ref 8.9–10.3)
Chloride: 109 mmol/L (ref 101–111)
Creatinine, Ser: 0.83 mg/dL (ref 0.44–1.00)
GFR calc Af Amer: >60 mL/min (ref >60)
GLUCOSE: 192 mg/dL — AB (ref 65–99)
POTASSIUM: 3.6 mmol/L (ref 3.5–5.1)
Sodium: 144 mmol/L (ref 135–145)

## 2017-02-06 LAB — GLUCOSE, CAPILLARY
GLUCOSE-CAPILLARY: 128 mg/dL — AB (ref 65–99)
GLUCOSE-CAPILLARY: 143 mg/dL — AB (ref 65–99)
Glucose-Capillary: 225 mg/dL — ABNORMAL HIGH (ref 65–99)
Glucose-Capillary: 152 mg/dL — ABNORMAL HIGH (ref 65–99)

## 2017-02-06 NOTE — Progress Notes (Signed)
PROGRESS NOTE    April Franklin  KGU:[id number] DOB: September 09, 1941 DOA: 02/04/2017 PCP: [name], MD    Brief Narrative:  76 y.o. 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 Initial evaluation reveals a chest dressing acute infarct and evolutionary changes of previous infarct, elevated troponin, fever.  Information is obtained from the family is at the bedside as patient is nonverbal. She was discharged 4 days ago from the hospital and daughter states she was "quite sleepy at that point". During the 4 days at home she developed intermittent fevers and worsening lethargy. Daughter reports noting leaking around her G-tube when she was "giving her Tylenol for her fever" so he came to the hospital. Does indicate they have been administering medications through her G-tube. They note she's been bedbound since her discharge they deny any diarrhea, any vomiting or indications of discomfort. Do note some increased coughing and secretion production. He reports she is not on oxygen at home. Called EMS he reports a temperature of 102.  Assessment & Plan:   Principal Problem:   SIRS (systemic inflammatory response syndrome) (HCC) Active Problems:   Hypertension   Elevated troponin   Diabetes mellitus with complication (HCC)   Acute on chronic respiratory failure ([place]) s/p Feinstein Tracheostomy 01/16/17   Metastatic adenocarcinoma to liver (HCC)   Anemia of critical lillness   Tracheostomy status (HCC)   Fever   Encephalopathy   Stroke ([place])   S/P percutaneous endoscopic gastrostomy (PEG)  tube placement ([place])   Acute deep vein thrombosis (DVT) of popliteal vein of right lower extremity ([place])   Palliative care encounter   #1. SIRS. Fever, leukocytosis, elevated lactic acid, tachypnea, tachycardia,  elevated troponin with normal EKG, worsening LFTs. Code sepsis initiated. Fluids provided broad-spectrum antibiotics initiated. Urine shows no signs of infection chest x-ray shows no infiltrate currently. suspect respiratory source given AMS in the setting of PEG tube concern for aspiration. Family has been made aware of worsening condition. -Follow blood cultures -For now, will continue empiric antibiotics for now -palliative care following. Appreciate input. See below  #2. Acute on chronic respiratory failure. Status post tracheostomy last hospitalization. Also treated for aspiration pneumonia last hospitalization No oxygen needs at home. Trach collar in place. Patient with tachypnea oxygen saturation level 92% with venti mask. X-ray without obvious infiltrate on review -currently stable on 6L  3. Acute on chronic encephalopathy. Likely related to acute stroke with extended stroke in setting of possible aspiration pna and MI.  Patient discharged 4 days ago after being hospitalized for stroke as noted above. Last hospitalization underwent mechanical thrombectomy. CT on admit demonstrated progressing acute infarct on left and evolutionary changes of previous infarct on right. Recent echo with an EF of 60-65%. Recent carotid Doppler with right stenosis. -Stroke team consulted. Discussed case with Dr. Erlinda Hong. Overall poor prognosis. -Have stopped aspirin. See below  #4. Elevated troponin concern for NSTEMI. EKG without ST changes evident. -Troponin has peaked to 5.84 -Most recent trop 4.98 -Discussed with on-call Cardiologist, Dr. Gwenlyn Found, who states that patient is not a candidate for aggressive intervention given severity of CVA and other medical issues. Unable to give beta blocker given  soft BP in the setting of worsening CVA. -  Initially continued on ASA, now stopped secondary to hgb in the 7 range today (was over 9 on 3/14).   5. Hypertension. Blood pressure the low end of normal upon presentation. Home medications include losartan. -Holding losartan for now -IV fluids as noted above -Currently  #6. Diabetes. Serum glucose 197 on admission. Recent hemoglobin A1c 7.2 -Sliding scale insulin every 4 hours ordered -holding lantus for now  #7. Metastatic adenocarcinoma to the liver. Increase in LFT's on admit - Will recheck CMP in AM  #8. End of Life: - Appreciate input by Palliative Care. Discussed case with Dr. Domingo Cocking. Overall poor prognosis. After lengthy family discussion, decision was made for DNR but with no escalation of care. - Hgb dropped to the 7 range, thus Lovenox and ASA stopped. No obvious signs of bleeding at this time. Family is aware of stopping antiplatelet and anticoaguation given concerns of drop in hgb  #9. DVT - LE DVT noted at last admit - Had started lovenox, however hgb now in the 7 range with no signs of obvious bleed. - Lovenox stopped. Family is aware and agrees with stopping lovenox.  DVT prophylaxis: Lovenox subQ Code Status: DNR Family Communication: Pt in room, family at bedside Disposition Plan: Uncertain at this time  Consultants:   Palliative Care  Neurology  Procedures:     Antimicrobials: Anti-infectives    Start     Dose/Rate Route Frequency Ordered Stop   02/04/17 2000  vancomycin (VANCOCIN) 500 mg in sodium chloride 0.9 % 100 mL IVPB     500 mg 100 mL/hr over 60 Minutes Intravenous Every 12 hours 02/04/17 0602     02/04/17 1400  piperacillin-tazobactam (ZOSYN) IVPB 3.375 g     3.375 g 12.5 mL/hr over 240 Minutes Intravenous Every 8 hours 02/04/17 0602     02/04/17 0600  piperacillin-tazobactam (ZOSYN) IVPB 3.375 g     3.375 g 100 mL/hr over 30 Minutes Intravenous  Once 02/04/17 0556 02/04/17 0741   02/04/17  0600  vancomycin (VANCOCIN) IVPB 1000 mg/200 mL premix     1,000 mg 200 mL/hr over 60 Minutes Intravenous  Once 02/04/17 0556 02/04/17 0810      Subjective: Unable to assess  Objective: Vitals:   02/06/17 0452 02/06/17 0940 02/06/17 1152 02/06/17 1514  BP: 135/73     Pulse: 82 87 82 85  Resp: 18 18 16 18   Temp: 98 F (36.7 C)     TempSrc: Axillary     SpO2: 97% 95% 96% 96%  Weight:      Height:       No intake or output data in the 24 hours ending 02/06/17 1525 Filed Weights   02/04/17 0541 02/04/17 1846  Weight: 63 kg (139 lb) 65.2 kg (143 lb 11.2 oz)    Examination:  General exam: Laying in bed, asleep, poorly arousable with sternal rub Respiratory system: Trach in place, no audible wheezing Cardiovascular system: regular rate, s1-2 Gastrointestinal system: soft, nondistended, pos BS Central nervous system: no seizures, no tremors Extremities: no clubbing, no cyanosis Skin: normal skin turgor no notable skin lesions seen Psychiatry:Remains non-verbal. Unable to fully assess.  Data Reviewed: I have personally reviewed following labs and imaging studies  CBC:  Recent Labs Lab 02/04/17 0557 02/04/17 0631 02/06/17 0409  WBC 14.4*  --  6.9  NEUTROABS 11.7*  --   --   HGB 9.5* 9.9* 7.9*  HCT 30.7* 29.0* 26.2*  MCV 91.6  --  91.9  PLT 177  --  263*   Basic Metabolic Panel:  Recent Labs Lab 02/04/17 0557 02/04/17 0631 02/06/17 0409  NA 137 137 144  K 5.1 5.1 3.6  CL 103 105 109  CO2 27  --  26  GLUCOSE 195* 197* 192*  BUN 20 26* 17  CREATININE 0.93 0.90 0.83  CALCIUM 10.8*  --  9.9   GFR: Estimated Creatinine Clearance: 52.7 mL/min (by C-G formula based on SCr of 0.83 mg/dL). Liver Function Tests:  Recent Labs Lab 02/04/17 0557  AST 156*  ALT 135*  ALKPHOS 460*  BILITOT 1.5*  PROT 6.2*  ALBUMIN 2.2*   No results for input(s): LIPASE, AMYLASE in the last 168 hours. No results for input(s): AMMONIA in the last 168 hours. Coagulation  Profile: No results for input(s): INR, PROTIME in the last 168 hours. Cardiac Enzymes:  Recent Labs Lab 02/04/17 0557 02/04/17 1211  TROPONINI 3.91* 4.98*   BNP (last 3 results) No results for input(s): PROBNP in the last 8760 hours. HbA1C: No results for input(s): HGBA1C in the last 72 hours. CBG:  Recent Labs Lab 02/05/17 1151 02/05/17 1758 02/05/17 2239 02/06/17 0811 02/06/17 1208  GLUCAP 159* 180* 143* 225* 152*   Lipid Profile: No results for input(s): CHOL, HDL, LDLCALC, TRIG, CHOLHDL, LDLDIRECT in the last 72 hours. Thyroid Function Tests: No results for input(s): TSH, T4TOTAL, FREET4, T3FREE, THYROIDAB in the last 72 hours. Anemia Panel: No results for input(s): VITAMINB12, FOLATE, FERRITIN, TIBC, IRON, RETICCTPCT in the last 72 hours. Sepsis Labs:  Recent Labs Lab 02/04/17 0607 02/04/17 0859  LATICACIDVEN 2.23* 1.79    Recent Results (from the past 240 hour(s))  Blood Culture (routine x 2)     Status: None (Preliminary result)   Collection Time: 02/04/17  5:40 AM  Result Value Ref Range Status   Specimen Description BLOOD RIGHT ARM  Final   Special Requests AEROBIC BOTTLE ONLY 10ML  Final   Culture NO GROWTH 2 DAYS  Final   Report Status PENDING  Incomplete  Blood Culture (routine x 2)     Status: None (Preliminary result)   Collection Time: 02/04/17  5:57 AM  Result Value Ref Range Status   Specimen Description BLOOD RIGHT HAND  Final   Special Requests BOTTLES DRAWN AEROBIC AND ANAEROBIC 5ML  Final   Culture NO GROWTH 2 DAYS  Final   Report Status PENDING  Incomplete  Urine culture     Status: None   Collection Time: 02/04/17  7:06 AM  Result Value Ref Range Status   Specimen Description URINE, RANDOM  Final   Special Requests NONE  Final   Culture NO GROWTH  Final   Report Status 02/05/2017 FINAL  Final     Radiology Studies: Dg Abdomen Peg Tube Location  Result Date: 02/04/2017 CLINICAL DATA:  Gastrostomy catheter placement EXAM: ABDOMEN  - 1 VIEW COMPARISON:  Study obtained earlier in the day. FINDINGS: Contrast, 50 mL Isovue-300, was injected into a gastrostomy catheter. The gastrostomy catheter is positioned in the gastric antrum. Contrast flows into the stomach without contrast extravasation. Bowel gas pattern is normal. No free air. IMPRESSION: Gastrostomy catheter positioning gastric antrum. No contrast extravasation evident. Electronically Signed   By: Lowella Grip III M.D.   On: 02/04/2017 17:09    Scheduled Meds: . guaiFENesin  1,200 mg Oral BID  . insulin aspart  0-15 Units Subcutaneous TID WC  . insulin aspart  0-5 Units Subcutaneous QHS  . piperacillin-tazobactam (ZOSYN)  IV  3.375 g Intravenous Q8H  .  vancomycin  500 mg Intravenous Q12H   Continuous Infusions: . sodium chloride 50 mL/hr at 02/06/17 0600     LOS: 2 days   Leilanny Fluitt, Orpah Melter, MD [facility] Pager [phone]  If 7PM-7AM, please contact night-coverage [email or web address] Password [facility] 02/06/2017, 3:25 PM

## 2017-02-07 DIAGNOSIS — I63532 Cerebral infarction due to unspecified occlusion or stenosis of left posterior cerebral artery: Secondary | ICD-10-CM

## 2017-02-07 DIAGNOSIS — I639 Cerebral infarction, unspecified: Secondary | ICD-10-CM

## 2017-02-07 LAB — GLUCOSE, CAPILLARY
GLUCOSE-CAPILLARY: 211 mg/dL — AB (ref 65–99)
Glucose-Capillary: 158 mg/dL — ABNORMAL HIGH (ref 65–99)
Glucose-Capillary: 137 mg/dL — ABNORMAL HIGH (ref 65–99)
Glucose-Capillary: 131 mg/dL — ABNORMAL HIGH (ref 65–99)

## 2017-02-07 NOTE — Progress Notes (Signed)
Patient in bed resting.  Family in room visiting.  Patient shows no signs of pain noted.  Family stayed overnight and stated that she displayed no signs or symptoms of pain.

## 2017-02-07 NOTE — Progress Notes (Signed)
PROGRESS NOTE    April Franklin  QQI:[id number] DOB: 1941-10-20 DOA: 02/04/2017 PCP: [name], MD    Brief Narrative:  76 y.o. 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 Initial evaluation reveals a chest dressing acute infarct and evolutionary changes of previous infarct, elevated troponin, fever.  Information is obtained from the family is at the bedside as patient is nonverbal. She was discharged 4 days ago from the hospital and daughter states she was "quite sleepy at that point". During the 4 days at home she developed intermittent fevers and worsening lethargy. Daughter reports noting leaking around her G-tube when she was "giving her Tylenol for her fever" so he came to the hospital. Does indicate they have been administering medications through her G-tube. They note she's been bedbound since her discharge they deny any diarrhea, any vomiting or indications of discomfort. Do note some increased coughing and secretion production. He reports sshe is not on oxygen at home. Called EMS he reports a temperature of 102.  Assessment & Plan:   Principal Problem:   SIRS (systemic inflammatory response syndrome) (HCC) Active Problems:   Hypertension   Elevated troponin   Diabetes mellitus with complication (HCC)   Acute on chronic respiratory failure ([place]) s/p Feinstein Tracheostomy 01/16/17   Metastatic adenocarcinoma to liver (HCC)   Anemia of critical lillness   Tracheostomy status (HCC)   Fever   Encephalopathy   Stroke ([place])   S/P percutaneous endoscopic gastrostomy (PEG)  tube placement ([place])   Acute deep vein thrombosis (DVT) of popliteal vein of right lower extremity ([place])   Palliative care encounter   #1. SIRS. Fever, leukocytosis, elevated lactic acid, tachypnea, tachycardia,  elevated troponin with normal EKG, worsening LFTs. Code sepsis initiated. Fluids provided broad-spectrum antibiotics initiated. Urine shows no signs of infection chest x-ray shows no infiltrate currently. suspect respiratory source given AMS in the setting of PEG tube concern for aspiration. Family is aware of worsened condition. -Blood cx neg -Pt had been continued on empiric abx. Will discontinue given negative source of infection -Palliative care is following. Appreciate input. See below  #2. Acute on chronic respiratory failure. Status post tracheostomy last hospitalization. Also treated for aspiration pneumonia last hospitalization No oxygen needs at home. Trach collar in place. Patient with tachypnea oxygen saturation level 92% with venti mask. X-ray without obvious infiltrate per my review -Patient remains on 6L via trach collar  3. Acute on chronic encephalopathy. Likely related to acute stroke with extended stroke in setting of possible aspiration pna and MI.  Patient discharged 4 days ago after being hospitalized for stroke as noted above. Last hospitalization underwent mechanical thrombectomy. CT on admit demonstrated progressing acute infarct on left and evolutionary changes of previous infarct on right. Recent echo with an EF of 60-65%. Recent carotid Doppler with right stenosis. -Stroke team consulted. Discussed case with Dr. Erlinda Hong. Overall poor prognosis. -Have stopped aspirin. See below for details - Have further discussed with Dr. Erlinda Hong on 3/16 who agreed to stop aspirin  #4. Elevated troponin concern for NSTEMI. EKG without ST changes evident. -Troponin has peaked to 5.84 -Most recent trop 4.98 -Discussed with on-call Cardiologist, Dr. Gwenlyn Found, who states that patient is  not a candidate  for aggressive intervention given severity of CVA and other medical issues. Unable to give beta blocker given soft BP in the setting of worsening CVA. -Patient was initially continued on ASA.  -Aspirin now stopped secondary to hgb in the 7 range today (was over 9 on 3/14).   5. Hypertension. Blood pressure the low end of normal upon presentation. Home medications include losartan. -Holding losartan for now given soft BP -IV fluids as noted above -Currently 128/58  #6. Diabetes. Serum glucose 197 on admission. Recent hemoglobin A1c 7.2 -Sliding scale insulin every 4 hours has been ordered -lantus on hold for now  #7. Metastatic adenocarcinoma to the liver. Increase in LFT's on admit - Likely secondary to metastatic colon cancer  #8. End of Life: - Appreciate input by Palliative Care. Discussed case with Dr. Domingo Cocking. Overall poor prognosis. After lengthy family discussion, decision was made for DNR but with no escalation of care. - Hgb dropped to the 7 range, thus Lovenox and ASA stopped. No obvious signs of bleeding at this time. Family has been aware of stopping antiplatelet and anticoaguation given concerns of drop in hgb - Family meeting initially planned for 3/16, however family members did not show. Rescheduled meeting on 3/17  #9. DVT - LE DVT noted at last admit - Had started lovenox, however hgb now in the 7 range with no signs of obvious bleed. - Lovenox was stopped. Family has been aware and agrees with stopping lovenox.  DVT prophylaxis: Lovenox subQ Code Status: DNR Family Communication: Pt in room, family at bedside Disposition Plan: Uncertain at this time  Consultants:   Palliative Care  Neurology  Procedures:     Antimicrobials: Anti-infectives    Start     Dose/Rate Route Frequency Ordered Stop   02/04/17 2000  vancomycin (VANCOCIN) 500 mg in sodium chloride 0.9 % 100 mL IVPB     500 mg 100 mL/hr over 60 Minutes Intravenous Every 12  hours 02/04/17 0602     02/04/17 1400  piperacillin-tazobactam (ZOSYN) IVPB 3.375 g     3.375 g 12.5 mL/hr over 240 Minutes Intravenous Every 8 hours 02/04/17 0602     02/04/17 0600  piperacillin-tazobactam (ZOSYN) IVPB 3.375 g     3.375 g 100 mL/hr over 30 Minutes Intravenous  Once 02/04/17 0556 02/04/17 0741   02/04/17 0600  vancomycin (VANCOCIN) IVPB 1000 mg/200 mL premix     1,000 mg 200 mL/hr over 60 Minutes Intravenous  Once 02/04/17 0556 02/04/17 0810      Subjective: Unable to fully assess  Objective: Vitals:   02/07/17 0522 02/07/17 0852 02/07/17 1133 02/07/17 1454  BP: (!) 128/58     Pulse: 80 81 86 89  Resp: 18 18 18 20   Temp: 98.4 F (36.9 C)     TempSrc: Axillary     SpO2: 97% 96% 98% 99%  Weight:      Height:        Intake/Output Summary (Last 24 hours) at 02/07/17 1812 Last data filed at 02/07/17 0700  Gross per 24 hour  Intake               60 ml  Output                0 ml  Net               60 ml   Filed Weights   02/04/17 0541 02/04/17 1846  Weight: 63 kg (139 lb) 65.2 kg (143 lb 11.2 oz)  Examination:  General exam: poor response to sternal rub, eyes close, appears comfortable Respiratory system: normal resp effort, no wheezing on auscultation Cardiovascular system: regular rhythm, s1-2 on auscultation Gastrointestinal system: pos BS, soft, nondistended Central nervous system: cn2-12 appears grossly intact, no tremors at present Extremities: no joint deformities, no cyanosis Skin: no rashes, no pallor Psychiatry:Currently non-verbal. Cannot fully assess.  Data Reviewed: I have personally reviewed following labs and imaging studies  CBC:  Recent Labs Lab 02/04/17 0557 02/04/17 0631 02/06/17 0409  WBC 14.4*  --  6.9  NEUTROABS 11.7*  --   --   HGB 9.5* 9.9* 7.9*  HCT 30.7* 29.0* 26.2*  MCV 91.6  --  91.9  PLT 177  --  124*   Basic Metabolic Panel:  Recent Labs Lab 02/04/17 0557 02/04/17 0631 02/06/17 0409  NA 137 137 144    K 5.1 5.1 3.6  CL 103 105 109  CO2 27  --  26  GLUCOSE 195* 197* 192*  BUN 20 26* 17  CREATININE 0.93 0.90 0.83  CALCIUM 10.8*  --  9.9   GFR: Estimated Creatinine Clearance: 52.7 mL/min (by C-G formula based on SCr of 0.83 mg/dL). Liver Function Tests:  Recent Labs Lab 02/04/17 0557  AST 156*  ALT 135*  ALKPHOS 460*  BILITOT 1.5*  PROT 6.2*  ALBUMIN 2.2*   No results for input(s): LIPASE, AMYLASE in the last 168 hours. No results for input(s): AMMONIA in the last 168 hours. Coagulation Profile: No results for input(s): INR, PROTIME in the last 168 hours. Cardiac Enzymes:  Recent Labs Lab 02/04/17 0557 02/04/17 1211  TROPONINI 3.91* 4.98*   BNP (last 3 results) No results for input(s): PROBNP in the last 8760 hours. HbA1C: No results for input(s): HGBA1C in the last 72 hours. CBG:  Recent Labs Lab 02/06/17 1621 02/06/17 2214 02/07/17 0713 02/07/17 1150 02/07/17 1614  GLUCAP 128* 143* 211* 158* 137*   Lipid Profile: No results for input(s): CHOL, HDL, LDLCALC, TRIG, CHOLHDL, LDLDIRECT in the last 72 hours. Thyroid Function Tests: No results for input(s): TSH, T4TOTAL, FREET4, T3FREE, THYROIDAB in the last 72 hours. Anemia Panel: No results for input(s): VITAMINB12, FOLATE, FERRITIN, TIBC, IRON, RETICCTPCT in the last 72 hours. Sepsis Labs:  Recent Labs Lab 02/04/17 0607 02/04/17 0859  LATICACIDVEN 2.23* 1.79    Recent Results (from the past 240 hour(s))  Blood Culture (routine x 2)     Status: None (Preliminary result)   Collection Time: 02/04/17  5:40 AM  Result Value Ref Range Status   Specimen Description BLOOD RIGHT ARM  Final   Special Requests AEROBIC BOTTLE ONLY 10ML  Final   Culture NO GROWTH 3 DAYS  Final   Report Status PENDING  Incomplete  Blood Culture (routine x 2)     Status: None (Preliminary result)   Collection Time: 02/04/17  5:57 AM  Result Value Ref Range Status   Specimen Description BLOOD RIGHT HAND  Final   Special  Requests BOTTLES DRAWN AEROBIC AND ANAEROBIC 5ML  Final   Culture NO GROWTH 3 DAYS  Final   Report Status PENDING  Incomplete  Urine culture     Status: None   Collection Time: 02/04/17  7:06 AM  Result Value Ref Range Status   Specimen Description URINE, RANDOM  Final   Special Requests NONE  Final   Culture NO GROWTH  Final   Report Status 02/05/2017 FINAL  Final     Radiology Studies: No results found.  Scheduled  Meds: . guaiFENesin  1,200 mg Oral BID  . insulin aspart  0-15 Units Subcutaneous TID WC  . insulin aspart  0-5 Units Subcutaneous QHS  . piperacillin-tazobactam (ZOSYN)  IV  3.375 g Intravenous Q8H  . vancomycin  500 mg Intravenous Q12H   Continuous Infusions: . sodium chloride 50 mL/hr at 02/06/17 1753     LOS: 3 days   Yordan Martindale, Orpah Melter, MD [facility] Pager [phone]  If 7PM-7AM, please contact night-coverage [email or web address] Password [facility] 02/07/2017, 6:12 PM

## 2017-02-07 NOTE — Progress Notes (Signed)
Daily Progress Note   Patient Name: April Franklin       Date: 02/07/2017 DOB: Nov 02, 1941  Age: 76 y.o. MRN#: [id number] Attending Physician: Donne Hazel, MD Primary Care Physician: [name], MD Admit Date: 02/04/2017  Reason for Consultation/Follow-up: Establishing goals of care  Subjective: Patient remains unresponsive.  Met with her daughter at the bedside.  We reviewed clinical course over the past 48 hours.  She reports that her family was unable to make it to the hospital for meeting today.  Length of Stay: 3  Current Medications: Scheduled Meds:  . guaiFENesin  1,200 mg Oral BID  . insulin aspart  0-15 Units Subcutaneous TID WC  . insulin aspart  0-5 Units Subcutaneous QHS    Continuous Infusions: . sodium chloride 50 mL/hr at 02/06/17 1753    PRN Meds: acetaminophen **OR** acetaminophen (TYLENOL) oral liquid 160 mg/5 mL **OR** acetaminophen, antiseptic oral rinse, chlorproMAZINE (THORAZINE) IV, diphenhydrAMINE, fentaNYL (SUBLIMAZE) injection, glycopyrrolate **OR** glycopyrrolate **OR** glycopyrrolate, haloperidol **OR** haloperidol **OR** haloperidol lactate, ipratropium-albuterol, LORazepam **OR** LORazepam **OR** LORazepam, LORazepam, ondansetron **OR** ondansetron (ZOFRAN) IV, polyvinyl alcohol  Physical Exam       General: Somnolent, does not respond to verbal or tactile stimulation, in no acute distress.  HEENT: No bruits, no goiter, no JVD, tracheostomy in place Heart: Regular rate and rhythm. No murmur appreciated. Lungs: Fair air movement, scattered crackles Abdomen: Soft, nontender, mild distended, PEG tube in place with brown discharge noted. positive bowel sounds.  Ext: No significant edema Skin: Warm and dry Neuro: Unresponsive    Vital Signs: BP  (!) 128/58 (BP Location: Left Arm)   Pulse 90   Temp 98.4 F (36.9 C) (Axillary)   Resp 17   Ht 5' 2" (1.575 m)   Wt 65.2 kg (143 lb 11.2 oz)   SpO2 95%   BMI 26.28 kg/m  SpO2: SpO2: 95 % O2 Device: O2 Device: Tracheostomy Collar O2 Flow Rate: O2 Flow Rate (L/min): 6 L/min  Intake/output summary:  Intake/Output Summary (Last 24 hours) at 02/07/17 2321 Last data filed at 02/07/17 2300  Gross per 24 hour  Intake               30 ml  Output                0 ml  Net               30 ml   LBM: Last BM Date: 02/05/17 Baseline Weight: Weight: 63 kg (139 lb) Most recent weight: Weight: 65.2 kg (143 lb 11.2 oz)       Palliative Assessment/Data:    Flowsheet Rows     Most Recent Value  Intake Tab  Referral Department  Hospitalist  Unit at Time of Referral  ER  Palliative Care Primary Diagnosis  Neurology  Date Notified  02/04/17  Palliative Care Type  New Palliative care  Reason for referral  End of Life Care Assistance, Clarify Goals of Care  Date of Admission  02/04/17  Date first seen by Palliative Care  02/05/17  # of days IP prior to Palliative referral  0  Clinical Assessment  Palliative Performance Scale Score  10%  Pain Max last 24 hours  Not able to report  Pain Min Last 24 hours  Not able to report  Psychosocial & Spiritual Assessment  Palliative Care Outcomes  Patient/Family meeting held?  Yes  Who was at the meeting?  husband, 3 daughters, son  Palliative Care Outcomes  Clarified goals of care      Patient Active Problem List   Diagnosis Date Noted  . S/P percutaneous endoscopic gastrostomy (PEG) tube placement ([place])   . Acute deep vein thrombosis (DVT) of popliteal vein of right lower extremity (HCC)   . Palliative care encounter   . SIRS (systemic inflammatory response syndrome) ([place]) 02/04/2017  . Encephalopathy 02/04/2017  . Stroke ([place]) 02/04/2017  . Acute CVA (cerebrovascular accident) ([place])   . NSTEMI (non-ST elevated myocardial infarction)  ([place])   . Sepsis ([place])   . Pressure injury of skin 01/24/2017  . Aspiration pneumonia ([place])   . Dizziness   . Fever   . Tracheostomy status ([place])   . Ventilator dependent ([place])   . Anemia of critical lillness 01/17/2017  . Hypomagnesemia 01/17/2017  . Cerebrovascular accident (CVA) due to thrombosis of precerebral artery ([place])   . Acute on chronic respiratory failure ([place]) s/p Feinstein Tracheostomy 01/16/17   . Cerebral embolism with cerebral infarction 01/10/2017  . Acute encephalopathy with coma 01/09/2017  . Elevated troponin 01/09/2017  . Diabetes mellitus with complication ([place]) 46/80/3212  . Hypercalcemia 01/09/2017  . Nonintractable headache   . Metastatic adenocarcinoma to liver ([place]) 12/30/2016  . Hypertension 02/21/2012  . At risk for hyperglycemia 02/21/2012  . Colon cancer ([place]) 02/21/2012    Palliative Care Assessment & Plan   Recommendations/Plan:  Continue current therapies.  Plan for family meeting tomorrow at [facility].  Code Status:    Code Status Orders        Start     Ordered   02/04/17 0920  Do not attempt resuscitation (DNR)  Continuous    Question Answer Comment  In the event of cardiac or respiratory ARREST Do not call a "code blue"   In the event of cardiac or respiratory ARREST Do not perform Intubation, CPR, defibrillation or ACLS   In the event of cardiac or respiratory ARREST Use medication by any route, position, wound care, and other measures to relive pain and suffering. May use oxygen, suction and manual treatment of airway obstruction as needed for comfort.      02/04/17 0926    Code Status History    Date Active Date Inactive Code Status Order ID Comments User Context   02/04/2017  9:13 AM 02/04/2017  9:26 AM DNR [id number]  Radene Gunning, NP ED   02/04/2017  8:31 AM 02/04/2017  9:13 AM Partial Code [id number]  Radene Gunning, NP ED   01/14/2017 11:24 AM 01/31/2017  9:53 PM DNR [id number]  Corey Harold, NP Inpatient   01/10/2017  4:39 AM  01/14/2017 11:24 AM Full Code [id number]  Luanne Bras, MD Inpatient   01/09/2017  9:40 PM 01/10/2017  4:39 AM Full Code [id number]  Orson Eva, MD Inpatient       Prognosis:   Poor  Discharge Planning:  To Be Determined  Care plan was discussed with daughter  Thank you for allowing the Palliative Medicine Team to assist in the care of this patient.   Total Time 15 Prolonged Time Billed No      Greater than 50%  of this time was spent counseling and coordinating care related to the above assessment and plan.  Micheline Rough, MD  Please contact Palliative Medicine Team phone at [phone] for questions and concerns.

## 2017-02-08 LAB — GLUCOSE, CAPILLARY
Glucose-Capillary: 211 mg/dL — ABNORMAL HIGH (ref 65–99)
Glucose-Capillary: 129 mg/dL — ABNORMAL HIGH (ref 65–99)
Glucose-Capillary: 158 mg/dL — ABNORMAL HIGH (ref 65–99)
Glucose-Capillary: 143 mg/dL — ABNORMAL HIGH (ref 65–99)

## 2017-02-08 MED ORDER — METOCLOPRAMIDE HCL 5 MG/5ML PO SOLN
10.0000 mg | Freq: Three times a day (TID) | ORAL | Status: DC
  Administered 2017-02-08 – 2017-02-11 (×11): 10 mg via ORAL
  Filled 2017-02-08 (×11): qty 10

## 2017-02-08 MED ORDER — AMOXICILLIN-POT CLAVULANATE 875-125 MG PO TABS
1.0000 | ORAL_TABLET | Freq: Two times a day (BID) | ORAL | Status: DC
  Administered 2017-02-08 – 2017-02-11 (×7): 1
  Filled 2017-02-08 (×7): qty 1

## 2017-02-08 NOTE — Progress Notes (Signed)
Daily Progress Note   Patient Name: April Franklin       Date: 02/08/2017 DOB: 10-02-1941  Age: 76 y.o. MRN#: [id number] Attending Physician: Donne Hazel, MD Primary Care Physician: [name], MD Admit Date: 02/04/2017  Reason for Consultation/Follow-up: Establishing goals of care  Subjective: I met today with 5 of the patient's children.  Her husband was at the hospital, but deferred meeting with [place] today.  See below.  Length of Stay: 4  Current Medications: Scheduled Meds:  . amoxicillin-clavulanate  1 tablet Per Tube Q12H  . guaiFENesin  1,200 mg Oral BID  . insulin aspart  0-15 Units Subcutaneous TID WC  . insulin aspart  0-5 Units Subcutaneous QHS  . metoCLOPramide  10 mg Oral TID AC & HS    Continuous Infusions: . sodium chloride 50 mL/hr at 02/08/17 0256    PRN Meds: acetaminophen **OR** acetaminophen (TYLENOL) oral liquid 160 mg/5 mL **OR** acetaminophen, antiseptic oral rinse, chlorproMAZINE (THORAZINE) IV, diphenhydrAMINE, fentaNYL (SUBLIMAZE) injection, glycopyrrolate **OR** glycopyrrolate **OR** glycopyrrolate, haloperidol **OR** haloperidol **OR** haloperidol lactate, ipratropium-albuterol, LORazepam **OR** LORazepam **OR** LORazepam, LORazepam, ondansetron **OR** ondansetron (ZOFRAN) IV, polyvinyl alcohol  Physical Exam      General:Somnolent, does not respond to verbal or tactile stimulation,in no acute distress.  HEENT:No bruits, no goiter, no JVD, tracheostomy in place Heart:Regular rate and rhythm. No murmur appreciated. Lungs: [place] movement, scattered crackles Abdomen: Soft, nontender, mild distended, PEG tube in place with brown discharge noted.positive bowel sounds.  Ext: No significant edema Skin:Warm and dry Neuro:Unresponsive     Vital Signs: BP (!) 138/56   Pulse 79   Temp 98.4 F (36.9 C) (Axillary)   Resp 16   Ht _0  (1.575 m)   Wt 65.2 kg (143 lb 11.2 oz)   SpO2 98%   BMI 26.28 kg/m  SpO2: SpO2: 98 % O2 Device: O2 Device: Tracheostomy Collar O2 Flow Rate: O2 Flow Rate (L/min): 6 L/min  Intake/output summary:  Intake/Output Summary (Last 24 hours) at 02/08/17 1927 Last data filed at 02/08/17 1229  Gross per 24 hour  Intake              530 ml  Output                0 ml  Net  530 ml   LBM: Last BM Date: 02/07/17 Baseline Weight: Weight: 63 kg (139 lb) Most recent weight: Weight: 65.2 kg (143 lb 11.2 oz)       Palliative Assessment/Data:    Flowsheet Rows     Most Recent Value  Intake Tab  Referral Department  Hospitalist  Unit at Time of Referral  ER  Palliative Care Primary Diagnosis  Neurology  Date Notified  02/04/17  Palliative Care Type  New Palliative care  Reason for referral  End of Life Care Assistance, Clarify Goals of Care  Date of Admission  02/04/17  Date first seen by Palliative Care  02/05/17  # of days IP prior to Palliative referral  0  Clinical Assessment  Palliative Performance Scale Score  10%  Pain Max last 24 hours  Not able to report  Pain Min Last 24 hours  Not able to report  Psychosocial & Spiritual Assessment  Palliative Care Outcomes  Patient/Family meeting held?  Yes  Who was at the meeting?  husband, 3 daughters, son  Palliative Care Outcomes  Clarified goals of care      Patient Active Problem List   Diagnosis Date Noted  . S/P percutaneous endoscopic gastrostomy (PEG) tube placement ([place])   . Acute deep vein thrombosis (DVT) of popliteal vein of right lower extremity (HCC)   . Palliative care encounter   . SIRS (systemic inflammatory response syndrome) ([place]) 02/04/2017  . Encephalopathy 02/04/2017  . Stroke ([place]) 02/04/2017  . Acute CVA (cerebrovascular accident) ([place])   . NSTEMI (non-ST elevated myocardial infarction) ([place])    . Sepsis ([place])   . Pressure injury of skin 01/24/2017  . Aspiration pneumonia ([place])   . Dizziness   . Fever   . Tracheostomy status ([place])   . Ventilator dependent ([place])   . Anemia of critical lillness 01/17/2017  . Hypomagnesemia 01/17/2017  . Cerebrovascular accident (CVA) due to thrombosis of precerebral artery ([place])   . Acute on chronic respiratory failure ([place]) s/p Feinstein Tracheostomy 01/16/17   . Cerebral embolism with cerebral infarction 01/10/2017  . Acute encephalopathy with coma 01/09/2017  . Elevated troponin 01/09/2017  . Diabetes mellitus with complication ([place]) 90/24/0973  . Hypercalcemia 01/09/2017  . Nonintractable headache   . Metastatic adenocarcinoma to liver ([place]) 12/30/2016  . Hypertension 02/21/2012  . At risk for hyperglycemia 02/21/2012  . Colon cancer ([place]) 02/21/2012    Palliative Care Assessment & Plan   Patient Profile: 76 y.o. 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   Recommendations/Plan:  I met today with 5 of the patient's children.  We reviewed her clinical course in depth, including metastatic cancer, DVT, CVAs, decreasing hgb, and NSTEMI this admission.  Her family reports understanding that her medical team is concerned that she is dying.  They remain steadfast that God will heal her if it is His will and want to preserve her life for as long as possible to give time for this to occur.  We discussed that we are in a difficult position with no further medical interventions that are likely to add additional benefit.  They understand that she is reaching end of  hospitalization and want to take her home again with  support of home health.    Attempted to initiate discussion that if her goal is to be at home, she would likely benefit from hospice services rather than repeated trips to the hospital (as there really is limited interventions available that she will tolerate or benefit from).  Family want not in a place to engage in this conversation.  They are interested only in talking about her fully recovering.  It may be beneficial for palliative to consult with family again during her next admission.  Her son believes that she will be returning to the hospital "walking in the door to say 'Hello' to you" rather than for medical needs.  Family reports having poor discharge communication last time she was discharged.  Please ensure that family is fully educated on needs at discharge.  Plan for addition of reglan as leakage around g tube may be due to poor stomach emptying due to gastroparesis.  Goals of Care and Additional Recommendations:  Limitations on Scope of Treatment: Full Scope Treatment until point of cardiac or respiratory arrest  Code Status:    Code Status Orders        Start     Ordered   02/04/17 0920  Do not attempt resuscitation (DNR)  Continuous    Question Answer Comment  In the event of cardiac or respiratory ARREST Do not call a "code blue"   In the event of cardiac or respiratory ARREST Do not perform Intubation, CPR, defibrillation or ACLS   In the event of cardiac or respiratory ARREST Use medication by any route, position, wound care, and other measures to relive pain and suffering. May use oxygen, suction and manual treatment of airway obstruction as needed for comfort.      02/04/17 0926    Code Status History    Date Active Date Inactive Code Status Order ID Comments User Context   02/04/2017  9:13 AM 02/04/2017  9:26 AM DNR [id number]  Radene Gunning, NP ED   02/04/2017  8:31 AM 02/04/2017  9:13 AM Partial Code [id number]   Radene Gunning, NP ED   01/14/2017 11:24 AM 01/31/2017  9:53 PM DNR [id number]  Corey Harold, NP Inpatient   01/10/2017  4:39 AM 01/14/2017 11:24 AM Full Code [id number]  Luanne Bras, MD Inpatient   01/09/2017  9:40 PM 01/10/2017  4:39 AM Full Code [id number]  Orson Eva, MD Inpatient       Prognosis:   Unable to determine, but poor overall  Discharge Planning:  Home with Home Health.  She is at extremely high risk for readmission.  Care plan was discussed with family, Dr. [name]  Thank you for allowing the Palliative Medicine Team to assist in the care of this patient.   Total Time 50 Prolonged Time Billed No      Greater than 50%  of this time was spent counseling and coordinating care related to the above assessment and plan.  Micheline Rough, MD  Please contact Palliative Medicine Team phone at [phone] for questions and concerns.

## 2017-02-08 NOTE — Evaluation (Signed)
Physical Therapy Evaluation Patient Details Name: April Franklin MRN: [id number] DOB: [birth year] Today's Date: 02/08/2017   History of Present Illness  Pt is a 76 y/o female admit with SIRS.  PMH with DM, HTN, and colon cancer s/p resection 2009. She has a more recent finding of multiple liver and pancreatic lesions s/p liver biopsy on 12/30/16 which showed new diagnosis of metastatic pancreatobilary adenocarcinoma. [facility] admit in Feb with multiple cerebral infarcts, basilar occusion on MRI with trach and PEG.  Family took pt home to care for her.   Clinical Impression  Pt admitted with above diagnosis. Pt currently with functional limitations due to the deficits listed below (see PT Problem List). Will give trial of PT to determine if services beneficial for pt.  Will follow acutely.  Pt will benefit from skilled PT to increase their independence and safety with mobility to allow discharge to the venue listed below.      Follow Up Recommendations Home health PT;Supervision/Assistance - 24 hour    Equipment Recommendations  None recommended by PT    Recommendations for Other Services       Precautions / Restrictions Precautions Precautions: Other (comment);Fall Precaution Comments: trach collar, PEG tube Restrictions Weight Bearing Restrictions: No      Mobility  Bed Mobility Overal bed mobility: Needs Assistance Bed Mobility: Rolling;Sidelying to Sit Rolling: Total assist;+2 for physical assistance         General bed mobility comments: Pt positioned in bed on her right side as she had been on her left side.  No verbalizations and no movement to command.   Transfers                    Ambulation/Gait             General Gait Details: unable  Stairs            Wheelchair Mobility    Modified Rankin (Stroke Patients Only) Modified Rankin (Stroke Patients Only) Pre-Morbid Rankin Score: No symptoms Modified Rankin: Severe disability     Balance                                              Pertinent Vitals/Pain Pain Assessment: No/denies pain  VSS  Home Living Family/patient expects to be discharged to:: Private residence Living Arrangements: Spouse/significant other Available Help at Discharge: Family;Available 24 hours/day Type of Home: House Home Access: Level entry     Home Layout: One level Home Equipment: Hospital bed;Wheelchair - manual (hoyer lift) Additional Comments: daughter is refusing SNF, plans to take pt home and care for her    Prior Function Level of Independence: Needs assistance   Gait / Transfers Assistance Needed: total care   ADL's / Homemaking Assistance Needed: total care  Comments: Has been dependent since Feb 2018     Hand Dominance   Dominant Hand: Right    Extremity/Trunk Assessment   Upper Extremity Assessment Upper Extremity Assessment: Defer to OT evaluation RUE Deficits / Details: active elbow flexion and extension only RUE Coordination: decreased fine motor;decreased gross motor LUE Deficits / Details: no active movement noted, full PROM LUE Coordination: decreased gross motor;decreased fine motor    Lower Extremity Assessment Lower Extremity Assessment: LLE deficits/detail;RLE deficits/detail RLE Deficits / Details: noted involuntary movement of ankle at times.  tone with ROM  LLE Deficits / Details: Noted involuntary  movement of ankle while pt lying in bed.  Tone with movement       Communication   Communication: Tracheostomy  Cognition Arousal/Alertness: Awake/alert Behavior During Therapy: Flat affect Overall Cognitive Status: Difficult to assess                 General Comments: following commands inconsistently, nodding head yes    General Comments General comments (skin integrity, edema, etc.): Talked with pts daughters who want therapy to try.  Daughter states that pt was wide awake earlier and responsive but that she is fatigued  currently.  No response during session for PT.     Exercises General Exercises - Upper Extremity Shoulder Flexion: PROM;Supine;Both Shoulder ABduction: PROM;Both;Supine Elbow Flexion: PROM;Both;Supine General Exercises - Lower Extremity Ankle Circles/Pumps: PROM;Both;Supine Heel Slides: PROM;Both;Supine Hip ABduction/ADduction: PROM;Both;Supine   Assessment/Plan    PT Assessment Patient needs continued PT services  PT Problem List Decreased strength;Decreased activity tolerance;Decreased balance;Decreased mobility;Decreased coordination;Decreased cognition;Decreased safety awareness       PT Treatment Interventions Functional mobility training;Therapeutic activities;Therapeutic exercise;Balance training;Neuromuscular re-education;Cognitive remediation;Patient/family education;DME instruction    PT Goals (Current goals can be found in the Care Plan section)  Acute Rehab PT Goals Patient Stated Goal: family reports goal is to take pt home from the hospital PT Goal Formulation: With family Time For Goal Achievement: 02/23/17 Potential to Achieve Goals: Poor    Frequency Min 2X/week   Barriers to discharge        Co-evaluation               End of Session Equipment Utilized During Treatment: Oxygen (trach at 28%) Activity Tolerance: Patient limited by fatigue Patient left: in bed;with call bell/phone within reach;with family/visitor present;with bed alarm set Nurse Communication: Mobility status;Need for lift equipment PT Visit Diagnosis: Other abnormalities of gait and mobility (R26.89)         Time: 9242-6834 PT Time Calculation (min) (ACUTE ONLY): 19 min   Charges:   PT Evaluation $PT Eval Moderate Complexity: 1 Procedure     PT G CodesDenice Paradise February 11, 2017, 4:43 PM [facility] Acute Rehabilitation [phone] (pager)

## 2017-02-08 NOTE — Progress Notes (Signed)
PROGRESS NOTE    April Franklin  ION:[id number] DOB: 1941-08-05 DOA: 02/04/2017 PCP: [name], MD    Brief Narrative:  76 y.o. 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 Initial evaluation reveals a chest dressing acute infarct and evolutionary changes of previous infarct, elevated troponin, fever.  Information is obtained from the family is at the bedside as patient is nonverbal. She was discharged 4 days ago from the hospital and daughter states she was "quite sleepy at that point". During the 4 days at home she developed intermittent fevers and worsening lethargy. Daughter reports noting leaking around her G-tube when she was "giving her Tylenol for her fever" so he came to the hospital. Does indicate they have been administering medications through her G-tube. They note she's been bedbound since her discharge they deny any diarrhea, any vomiting or indications of discomfort. Do note some increased coughing and secretion production. He reports she is not on oxygen at home. Called EMS he reports a temperature of 102.  Assessment & Plan:   Principal Problem:   SIRS (systemic inflammatory response syndrome) (HCC) Active Problems:   Hypertension   Elevated troponin   Diabetes mellitus with complication (HCC)   Acute on chronic respiratory failure ([place]) s/p Feinstein Tracheostomy 01/16/17   Metastatic adenocarcinoma to liver (HCC)   Anemia of critical lillness   Tracheostomy status (HCC)   Fever   Encephalopathy   Stroke ([place])   S/P percutaneous endoscopic gastrostomy (PEG)  tube placement ([place])   Acute deep vein thrombosis (DVT) of popliteal vein of right lower extremity ([place])   Palliative care encounter   #1. SIRS. Fever, leukocytosis, elevated lactic acid, tachypnea, tachycardia,  elevated troponin with normal EKG, worsening LFTs. Code sepsis initiated. Fluids provided broad-spectrum antibiotics initiated. Urine shows no signs of infection chest x-ray shows no infiltrate currently. suspect respiratory source given AMS in the setting of PEG tube concern for aspiration. Family is aware of worsened condition. -Blood cx neg -Pt had been continued on empiric abx. Have transitioned to empiric per tube augmentin to complete course -Palliative care is following. Greatly appreciate assistance. See below  #2. Acute on chronic respiratory failure. Status post tracheostomy last hospitalization. Also treated for aspiration pneumonia last hospitalization No oxygen needs at home. Trach collar remains in place. Patient with tachypnea oxygen saturation level 92% with venti mask. X-ray without obvious infiltrate per my review -Patient remains on 6L via trach collar. Wean as tolerated  3. Acute on chronic encephalopathy. Likely related to acute stroke with extended stroke in setting of possible aspiration pna and MI.  Patient discharged 4 days ago after being hospitalized for stroke as noted above. Last hospitalization underwent mechanical thrombectomy. CT on admit demonstrated progressing acute infarct on left and evolutionary changes of previous infarct on right. Recent echo with an EF of 60-65%. Recent carotid Doppler with right stenosis. -Stroke team consulted. Discussed case with Dr. Erlinda Hong. Overall poor prognosis. -Have stopped aspirin. See below for details - Have further discussed with Dr. Erlinda Hong on 3/16 who agreed to stop aspirin - Currently stable  #4. Elevated troponin concern for NSTEMI. EKG without ST changes evident. -Troponin has peaked to 5.84 -Most recent trop  4.98 -Discussed with on-call  Cardiologist, Dr. Gwenlyn Found, who states that patient is not a candidate for aggressive intervention given severity of CVA and other medical issues. Unable to give beta blocker given soft BP in the setting of worsening CVA. -Patient was initially continued on ASA.  -Aspirin now stopped secondary to hgb in the 7 range today (was over 9 on 3/14). - Seems stable at present   5. Hypertension. Blood pressure the low end of normal upon presentation. Home medications include losartan. -Holding losartan for now given soft BP at presentation -IV fluids as noted above -Currently 138/56 -Given concerns for NSTEMI and CVA, will start low dose metoprolol per tube  #6. Diabetes. Serum glucose 197 on admission. Recent hemoglobin A1c 7.2 -Sliding scale insulin every 4 hours has been ordered -lantus on hold for now -Stable at present  #7. Metastatic adenocarcinoma to the liver. Increase in LFT's on admit - Likely secondary to metastatic colon cancer - Presently seems stable  #8. End of Life: - Appreciate input by Palliative Care. Discussed case with Dr. Domingo Cocking. Overall poor prognosis. After lengthy family discussion, decision was made for DNR but with no escalation of care. - Hgb dropped to the 7 range, thus Lovenox and ASA stopped. No obvious signs of bleeding at this time. Family has been aware of stopping antiplatelet and anticoaguation given concerns of drop in hgb - Discussed case with Dr. Domingo Cocking. Greatly appreciate assistance. See input by Palliative Care. Pt DNR. Plan now for discharge planning with home health  #9. DVT - LE DVT noted at last admit - Had started lovenox, however hgb now in the 7 range with no signs of obvious bleed. - Lovenox was stopped. Family has been aware and agrees with stopping lovenox given bleeding risk - Seems stable  #10 Suspected acute blood loss anemia - Unclear source - Sudden drop in hgb noted after starting asa and prophylactic  dosed lovenox, both now stopped per above - Have discussed case with GI who does not recommend endoscopic intervention given significant cardiovascular risk involved`  DVT prophylaxis: Lovenox subQ Code Status: DNR Family Communication: Pt in room, family at bedside Disposition Plan: Uncertain at this time  Consultants:   Palliative Care  Neurology  Procedures:     Antimicrobials: Anti-infectives    Start     Dose/Rate Route Frequency Ordered Stop   02/08/17 1315  amoxicillin-clavulanate (AUGMENTIN) 875-125 MG per tablet 1 tablet     1 tablet Per Tube Every 12 hours 02/08/17 1300     02/04/17 2000  vancomycin (VANCOCIN) 500 mg in sodium chloride 0.9 % 100 mL IVPB  Status:  Discontinued     500 mg 100 mL/hr over 60 Minutes Intravenous Every 12 hours 02/04/17 0602 02/07/17 1815   02/04/17 1400  piperacillin-tazobactam (ZOSYN) IVPB 3.375 g  Status:  Discontinued     3.375 g 12.5 mL/hr over 240 Minutes Intravenous Every 8 hours 02/04/17 0602 02/07/17 1815   02/04/17 0600  piperacillin-tazobactam (ZOSYN) IVPB 3.375 g     3.375 g 100 mL/hr over 30 Minutes Intravenous  Once 02/04/17 0556 02/04/17 0741   02/04/17 0600  vancomycin (VANCOCIN) IVPB 1000 mg/200 mL premix     1,000 mg 200 mL/hr over 60 Minutes Intravenous  Once 02/04/17 0556 02/04/17 0810      Subjective: Cannot fully assess  Objective: Vitals:   02/08/17 0656 02/08/17 0939 02/08/17 1212 02/08/17 1500  BP: (!) 138/56     Pulse: 77 79 76 79  Resp:  15 18 16   Temp:  98.4 F (36.9 C)     TempSrc: Axillary     SpO2: 97% 98% 98% 98%  Weight:      Height:        Intake/Output Summary (Last 24 hours) at 02/08/17 1615 Last data filed at 02/08/17 1229  Gross per 24 hour  Intake              530 ml  Output                0 ml  Net              530 ml   Filed Weights   02/04/17 0541 02/04/17 1846  Weight: 63 kg (139 lb) 65.2 kg (143 lb 11.2 oz)    Examination:  General exam: appears comfortable, in  nad Respiratory system: normal chest rise, no audible wheezing Cardiovascular system: regular rate, s1-2 noted Gastrointestinal system: PEG in place, soft, nondistedned Central nervous system: no seizures, no tremors Extremities: no clubbing, no cyanosis Skin: normal skin turgor, no notable skin lesions seen Psychiatry:remains non-verbal. Unable tofully assess.  Data Reviewed: I have personally reviewed following labs and imaging studies  CBC:  Recent Labs Lab 02/04/17 0557 02/04/17 0631 02/06/17 0409  WBC 14.4*  --  6.9  NEUTROABS 11.7*  --   --   HGB 9.5* 9.9* 7.9*  HCT 30.7* 29.0* 26.2*  MCV 91.6  --  91.9  PLT 177  --  474*   Basic Metabolic Panel:  Recent Labs Lab 02/04/17 0557 02/04/17 0631 02/06/17 0409  NA 137 137 144  K 5.1 5.1 3.6  CL 103 105 109  CO2 27  --  26  GLUCOSE 195* 197* 192*  BUN 20 26* 17  CREATININE 0.93 0.90 0.83  CALCIUM 10.8*  --  9.9   GFR: Estimated Creatinine Clearance: 52.7 mL/min (by C-G formula based on SCr of 0.83 mg/dL). Liver Function Tests:  Recent Labs Lab 02/04/17 0557  AST 156*  ALT 135*  ALKPHOS 460*  BILITOT 1.5*  PROT 6.2*  ALBUMIN 2.2*   No results for input(s): LIPASE, AMYLASE in the last 168 hours. No results for input(s): AMMONIA in the last 168 hours. Coagulation Profile: No results for input(s): INR, PROTIME in the last 168 hours. Cardiac Enzymes:  Recent Labs Lab 02/04/17 0557 02/04/17 1211  TROPONINI 3.91* 4.98*   BNP (last 3 results) No results for input(s): PROBNP in the last 8760 hours. HbA1C: No results for input(s): HGBA1C in the last 72 hours. CBG:  Recent Labs Lab 02/07/17 1150 02/07/17 1614 02/07/17 2148 02/08/17 0813 02/08/17 1207  GLUCAP 158* 137* 131* 211* 129*   Lipid Profile: No results for input(s): CHOL, HDL, LDLCALC, TRIG, CHOLHDL, LDLDIRECT in the last 72 hours. Thyroid Function Tests: No results for input(s): TSH, T4TOTAL, FREET4, T3FREE, THYROIDAB in the last 72  hours. Anemia Panel: No results for input(s): VITAMINB12, FOLATE, FERRITIN, TIBC, IRON, RETICCTPCT in the last 72 hours. Sepsis Labs:  Recent Labs Lab 02/04/17 0607 02/04/17 0859  LATICACIDVEN 2.23* 1.79    Recent Results (from the past 240 hour(s))  Blood Culture (routine x 2)     Status: None (Preliminary result)   Collection Time: 02/04/17  5:40 AM  Result Value Ref Range Status   Specimen Description BLOOD RIGHT ARM  Final   Special Requests AEROBIC BOTTLE ONLY 10ML  Final   Culture NO GROWTH 4 DAYS  Final   Report Status PENDING  Incomplete  Blood Culture (routine x 2)  Status: None (Preliminary result)   Collection Time: 02/04/17  5:57 AM  Result Value Ref Range Status   Specimen Description BLOOD RIGHT HAND  Final   Special Requests BOTTLES DRAWN AEROBIC AND ANAEROBIC 5ML  Final   Culture NO GROWTH 4 DAYS  Final   Report Status PENDING  Incomplete  Urine culture     Status: None   Collection Time: 02/04/17  7:06 AM  Result Value Ref Range Status   Specimen Description URINE, RANDOM  Final   Special Requests NONE  Final   Culture NO GROWTH  Final   Report Status 02/05/2017 FINAL  Final     Radiology Studies: No results found.  Scheduled Meds: . amoxicillin-clavulanate  1 tablet Per Tube Q12H  . guaiFENesin  1,200 mg Oral BID  . insulin aspart  0-15 Units Subcutaneous TID WC  . insulin aspart  0-5 Units Subcutaneous QHS   Continuous Infusions: . sodium chloride 50 mL/hr at 02/08/17 0256     LOS: 4 days   CHIU, Orpah Melter, MD [facility] Pager [phone]  If 7PM-7AM, please contact night-coverage [email or web address] Password [facility] 02/08/2017, 4:15 PM

## 2017-02-09 LAB — CULTURE, BLOOD (ROUTINE X 2)
Culture: NO GROWTH
Culture: NO GROWTH

## 2017-02-09 LAB — GLUCOSE, CAPILLARY
GLUCOSE-CAPILLARY: 221 mg/dL — AB (ref 65–99)
GLUCOSE-CAPILLARY: 138 mg/dL — AB (ref 65–99)
Glucose-Capillary: 135 mg/dL — ABNORMAL HIGH (ref 65–99)
Glucose-Capillary: 177 mg/dL — ABNORMAL HIGH (ref 65–99)

## 2017-02-09 NOTE — Progress Notes (Signed)
Per RN, MD wants pt weaned to room air ATC.  Sat 93% on room air currently, no distress noted, VSS.  RN aware.

## 2017-02-09 NOTE — Progress Notes (Signed)
PROGRESS NOTE    April Franklin  WUJ:[id number] DOB: 08/31/1941 DOA: 02/04/2017 PCP: [name], MD    Brief Narrative:  76 y.o. 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 Initial evaluation reveals a chest dressing acute infarct and evolutionary changes of previous infarct, elevated troponin, fever.  Information is obtained from the family is at the bedside as patient is nonverbal. She was discharged 4 days ago from the hospital and daughter states she was "quite sleepy at that point". During the 4 days at home she developed intermittent fevers and worsening lethargy. Daughter reports noting leaking around her G-tube when she was "giving her Tylenol for her fever" so he came to the hospital. Does indicate they have been administering medications through her G-tube. They note she's been bedbound since her discharge they deny any diarrhea, any vomiting or indications of discomfort. Do note some increased coughing and secretion production. He reports she is not on oxygen at home. Called EMS he reports a temperature of 102.  Assessment & Plan:   Principal Problem:   SIRS (systemic inflammatory response syndrome) (HCC) Active Problems:   Hypertension   Elevated troponin   Diabetes mellitus with complication (HCC)   Acute on chronic respiratory failure ([place]) s/p Feinstein Tracheostomy 01/16/17   Metastatic adenocarcinoma to liver (HCC)   Anemia of critical lillness   Tracheostomy status (HCC)   Fever   Encephalopathy   Stroke ([place])   S/P percutaneous endoscopic gastrostomy (PEG)  tube placement ([place])   Acute deep vein thrombosis (DVT) of popliteal vein of right lower extremity ([place])   Palliative care encounter  #1. SIRS. Fever, leukocytosis, elevated lactic acid, tachypnea, tachycardia,  elevated troponin with normal EKG, worsening LFTs. Code sepsis initiated. Fluids provided broad-spectrum antibiotics initiated. Urine shows no signs of infection chest x-ray shows no infiltrate currently. suspect respiratory source given AMS in the setting of PEG tube concern for aspiration. Family is aware of worsened condition. -Blood cx noted to be neg -Pt had been continued on empiric abx. Have transitioned to empiric per tube augmentin to complete course -Palliative care is following. Greatly appreciate assistance. See below  #2. Acute on chronic respiratory failure. Status post tracheostomy last hospitalization. Also treated for aspiration pneumonia last hospitalization No oxygen needs at home. Trach collar remains in place. Patient with tachypnea oxygen saturation level 92% with venti mask. X-ray without obvious infiltrate per my review -Patient remains on 6L via trach collar. Wean to minimal O2 as tolerated, as per previous discharge  3. Acute on chronic encephalopathy. Likely related to acute stroke with extended stroke in setting of possible aspiration pna and MI.  Patient discharged 4 days ago after being hospitalized for stroke as noted above. Last hospitalization underwent mechanical thrombectomy. CT on admit demonstrated progressing acute infarct on left and evolutionary changes of previous infarct on right. Recent echo with an EF of 60-65%. Recent carotid Doppler with right stenosis. -Stroke team consulted. Discussed case with Dr. Erlinda Hong. Overall poor prognosis. -Have stopped aspirin. See below for details - Have further discussed with Dr. Erlinda Hong on 3/16 who agreed to stop aspirin - Currently stable  #4. Elevated troponin concern for NSTEMI. EKG without ST changes  evident. -Troponin has peaked  to 5.84 -Most recent trop 4.98 -Discussed with on-call Cardiologist, Dr. Gwenlyn Found, who states that patient is not a candidate for aggressive intervention given severity of CVA and other medical issues. Unable to give beta blocker given soft BP in the setting of worsening CVA. -Patient was initially continued on ASA.  -Aspirin now stopped secondary to hgb in the 7 range today (was over 9 on 3/14). - Seems stable at present   5. Hypertension. Blood pressure the low end of normal upon presentation. Home medications include losartan. -Holding losartan for now given soft BP at presentation -IV fluids as noted above -Currently stable -Given concerns for NSTEMI and CVA, will start low dose metoprolol per tube  #6. Diabetes. Serum glucose 197 on admission. Recent hemoglobin A1c 7.2 -Sliding scale insulin every 4 hours has been ordered -lantus on hold for now -Stable at present  #7. Metastatic adenocarcinoma to the liver. Increase in LFT's on admit - Likely secondary to metastatic colon cancer - Presently seems stable  #8. End of Life: - Appreciate input by Palliative Care. Discussed case with Dr. Domingo Cocking. Overall poor prognosis. After lengthy family discussion, decision was made for DNR but with no escalation of care. - Hgb dropped to the 7 range, thus Lovenox and ASA stopped. No obvious signs of bleeding at this time. Family has been aware of stopping antiplatelet and anticoaguation given concerns of drop in hgb - Discussed case with Dr. Domingo Cocking. Greatly appreciate assistance. See input by Palliative Care. Pt DNR. Plan now for discharge planning with home health  #9. DVT - LE DVT noted at last admit - Had started lovenox, however hgb now in the 7 range with no signs of obvious bleed. - Lovenox was stopped. Family has been aware and agrees with stopping lovenox given bleeding risk - Seems stable  #10 Suspected acute blood loss anemia - Unclear source - Sudden  drop in hgb noted after starting asa and prophylactic dosed lovenox, both now stopped per above - Have discussed case with GI who does not recommend endoscopic intervention given significant cardiovascular risk involved`  DVT prophylaxis: Lovenox subQ Code Status: DNR Family Communication: Pt in room, family at bedside Disposition Plan: Uncertain at this time  Consultants:   Palliative Care  Neurology  Procedures:     Antimicrobials: Anti-infectives    Start     Dose/Rate Route Frequency Ordered Stop   02/08/17 1315  amoxicillin-clavulanate (AUGMENTIN) 875-125 MG per tablet 1 tablet     1 tablet Per Tube Every 12 hours 02/08/17 1300     02/04/17 2000  vancomycin (VANCOCIN) 500 mg in sodium chloride 0.9 % 100 mL IVPB  Status:  Discontinued     500 mg 100 mL/hr over 60 Minutes Intravenous Every 12 hours 02/04/17 0602 02/07/17 1815   02/04/17 1400  piperacillin-tazobactam (ZOSYN) IVPB 3.375 g  Status:  Discontinued     3.375 g 12.5 mL/hr over 240 Minutes Intravenous Every 8 hours 02/04/17 0602 02/07/17 1815   02/04/17 0600  piperacillin-tazobactam (ZOSYN) IVPB 3.375 g     3.375 g 100 mL/hr over 30 Minutes Intravenous  Once 02/04/17 0556 02/04/17 0741   02/04/17 0600  vancomycin (VANCOCIN) IVPB 1000 mg/200 mL premix     1,000 mg 200 mL/hr over 60 Minutes Intravenous  Once 02/04/17 0556 02/04/17 0810      Subjective: Unable to fully assess  Objective: Vitals:   02/09/17 0500 02/09/17 0910 02/09/17 1159 02/09/17 1510  BP: (!) 145/59     Pulse: 77 76  87 89  Resp: (!) 22 20 (!) 21 20  Temp: 97.9 F (36.6 C)     TempSrc: Axillary     SpO2: 98% 98% 98% 93%  Weight:      Height:        Intake/Output Summary (Last 24 hours) at 02/09/17 1804 Last data filed at 02/09/17 1529  Gross per 24 hour  Intake             1000 ml  Output                0 ml  Net             1000 ml   Filed Weights   02/04/17 0541 02/04/17 1846  Weight: 63 kg (139 lb) 65.2 kg (143 lb 11.2 oz)     Examination:  General exam: laying in bed, turning head occasionally, in nad Respiratory system: normal chest rise, no audible wheezing Cardiovascular system: regular rate, s1-2 noted Gastrointestinal system: PEG in place, soft, nondistedned Central nervous system: no seizures, no tremors Extremities: no clubbing, no cyanosis Skin: normal skin turgor, no notable skin lesions seen Psychiatry:remains non-verbal. Unable tofully assess.  Data Reviewed: I have personally reviewed following labs and imaging studies  CBC:  Recent Labs Lab 02/04/17 0557 02/04/17 0631 02/06/17 0409  WBC 14.4*  --  6.9  NEUTROABS 11.7*  --   --   HGB 9.5* 9.9* 7.9*  HCT 30.7* 29.0* 26.2*  MCV 91.6  --  91.9  PLT 177  --  063*   Basic Metabolic Panel:  Recent Labs Lab 02/04/17 0557 02/04/17 0631 02/06/17 0409  NA 137 137 144  K 5.1 5.1 3.6  CL 103 105 109  CO2 27  --  26  GLUCOSE 195* 197* 192*  BUN 20 26* 17  CREATININE 0.93 0.90 0.83  CALCIUM 10.8*  --  9.9   GFR: Estimated Creatinine Clearance: 52.7 mL/min (by C-G formula based on SCr of 0.83 mg/dL). Liver Function Tests:  Recent Labs Lab 02/04/17 0557  AST 156*  ALT 135*  ALKPHOS 460*  BILITOT 1.5*  PROT 6.2*  ALBUMIN 2.2*   No results for input(s): LIPASE, AMYLASE in the last 168 hours. No results for input(s): AMMONIA in the last 168 hours. Coagulation Profile: No results for input(s): INR, PROTIME in the last 168 hours. Cardiac Enzymes:  Recent Labs Lab 02/04/17 0557 02/04/17 1211  TROPONINI 3.91* 4.98*   BNP (last 3 results) No results for input(s): PROBNP in the last 8760 hours. HbA1C: No results for input(s): HGBA1C in the last 72 hours. CBG:  Recent Labs Lab 02/08/17 1654 02/08/17 2132 02/09/17 0809 02/09/17 1203 02/09/17 1725  GLUCAP 158* 143* 221* 135* 138*   Lipid Profile: No results for input(s): CHOL, HDL, LDLCALC, TRIG, CHOLHDL, LDLDIRECT in the last 72 hours. Thyroid Function  Tests: No results for input(s): TSH, T4TOTAL, FREET4, T3FREE, THYROIDAB in the last 72 hours. Anemia Panel: No results for input(s): VITAMINB12, FOLATE, FERRITIN, TIBC, IRON, RETICCTPCT in the last 72 hours. Sepsis Labs:  Recent Labs Lab 02/04/17 0160 02/04/17 0859  LATICACIDVEN 2.23* 1.79    Recent Results (from the past 240 hour(s))  Blood Culture (routine x 2)     Status: None   Collection Time: 02/04/17  5:40 AM  Result Value Ref Range Status   Specimen Description BLOOD RIGHT ARM  Final   Special Requests AEROBIC BOTTLE ONLY 10ML  Final   Culture NO GROWTH 5 DAYS  Final  Report Status 02/09/2017 FINAL  Final  Blood Culture (routine x 2)     Status: None   Collection Time: 02/04/17  5:57 AM  Result Value Ref Range Status   Specimen Description BLOOD RIGHT HAND  Final   Special Requests BOTTLES DRAWN AEROBIC AND ANAEROBIC 5ML  Final   Culture NO GROWTH 5 DAYS  Final   Report Status 02/09/2017 FINAL  Final  Urine culture     Status: None   Collection Time: 02/04/17  7:06 AM  Result Value Ref Range Status   Specimen Description URINE, RANDOM  Final   Special Requests NONE  Final   Culture NO GROWTH  Final   Report Status 02/05/2017 FINAL  Final     Radiology Studies: No results found.  Scheduled Meds: . amoxicillin-clavulanate  1 tablet Per Tube Q12H  . guaiFENesin  1,200 mg Oral BID  . insulin aspart  0-15 Units Subcutaneous TID WC  . insulin aspart  0-5 Units Subcutaneous QHS  . metoCLOPramide  10 mg Oral TID AC & HS   Continuous Infusions: . sodium chloride 50 mL/hr at 02/09/17 1529     LOS: 5 days   Mafalda Mcginniss, Orpah Melter, MD [facility] Pager [phone]  If 7PM-7AM, please contact night-coverage [email or web address] Password [facility] 02/09/2017, 6:04 PM

## 2017-02-10 LAB — GLUCOSE, CAPILLARY
Glucose-Capillary: 271 mg/dL — ABNORMAL HIGH (ref 65–99)
Glucose-Capillary: 268 mg/dL — ABNORMAL HIGH (ref 65–99)
Glucose-Capillary: 171 mg/dL — ABNORMAL HIGH (ref 65–99)
Glucose-Capillary: 225 mg/dL — ABNORMAL HIGH (ref 65–99)

## 2017-02-10 MED ORDER — NYSTATIN 100000 UNIT/GM EX CREA
TOPICAL_CREAM | Freq: Two times a day (BID) | CUTANEOUS | Status: DC
  Administered 2017-02-10 – 2017-02-11 (×3): via TOPICAL
  Filled 2017-02-10 (×2): qty 15

## 2017-02-10 MED ORDER — JEVITY 1.2 CAL PO LIQD: 1000.0000 mL | ORAL | Status: DC

## 2017-02-10 MED ORDER — METOPROLOL TARTRATE 25 MG/10 ML ORAL SUSPENSION
12.5000 mg | Freq: Two times a day (BID) | ORAL | Status: DC
  Administered 2017-02-11 (×2): 12.5 mg
  Filled 2017-02-10 (×3): qty 5

## 2017-02-10 MED ORDER — INSULIN GLARGINE 100 UNIT/ML ~~LOC~~ SOLN
20.0000 [IU] | Freq: Every day | SUBCUTANEOUS | Status: DC
  Administered 2017-02-10: 20 [IU] via SUBCUTANEOUS
  Filled 2017-02-10 (×2): qty 0.2

## 2017-02-10 MED ORDER — GLUCERNA 1.2 CAL PO LIQD
1000.0000 mL | ORAL | Status: DC
  Administered 2017-02-10: 1000 mL
  Filled 2017-02-10 (×4): qty 1000

## 2017-02-10 NOTE — Progress Notes (Signed)
Daily Progress Note   Patient Name: April Franklin       Date: 02/10/2017 DOB: [birth year]  Age: 76 y.o. MRN#: [id number] Attending Physician: Donne Hazel, MD Primary Care Physician: [name], MD Admit Date: 02/04/2017  Reason for Consultation/Follow-up: Establishing goals of care  Subjective: I met today with son, April Franklin, at bedside.  See below.  Length of Stay: 6  Current Medications: Scheduled Meds:  . amoxicillin-clavulanate  1 tablet Per Tube Q12H  . guaiFENesin  1,200 mg Oral BID  . insulin aspart  0-15 Units Subcutaneous TID WC  . insulin aspart  0-5 Units Subcutaneous QHS  . metoCLOPramide  10 mg Oral TID AC & HS  . nystatin cream   Topical BID    Continuous Infusions: . sodium chloride 50 mL/hr at 02/09/17 1529  . feeding supplement (GLUCERNA 1.2 CAL) 1,000 mL (02/10/17 1216)    PRN Meds: acetaminophen **OR** acetaminophen (TYLENOL) oral liquid 160 mg/5 mL **OR** acetaminophen, antiseptic oral rinse, chlorproMAZINE (THORAZINE) IV, diphenhydrAMINE, fentaNYL (SUBLIMAZE) injection, glycopyrrolate **OR** glycopyrrolate **OR** glycopyrrolate, haloperidol **OR** haloperidol **OR** haloperidol lactate, ipratropium-albuterol, LORazepam **OR** LORazepam **OR** LORazepam, LORazepam, ondansetron **OR** ondansetron (ZOFRAN) IV, polyvinyl alcohol  Physical Exam      General:Eyes open, tracks in room but remains nonverbal,in no acute distress.  HEENT:No bruits, no goiter, no JVD, tracheostomy in place Heart:Regular rate and rhythm. No murmur appreciated. Lungs: [place] movement, scattered crackles Abdomen: Soft, nontender, mild distended, PEG tube in place.positive bowel sounds.  Ext: No significant edema Skin:Warm and dry  Vital Signs: BP (!) 155/64 (BP Location:  Right Arm)   Pulse 81   Temp 98.1 F (36.7 C) (Axillary)   Resp 18   Ht _0  (1.575 m)   Wt 65.2 kg (143 lb 11.2 oz)   SpO2 94%   BMI 26.28 kg/m  SpO2: SpO2: 94 % O2 Device: O2 Device: Tracheostomy Collar O2 Flow Rate: O2 Flow Rate (L/min): 6 L/min  Intake/output summary:   Intake/Output Summary (Last 24 hours) at 02/10/17 1434 Last data filed at 02/10/17 1239  Gross per 24 hour  Intake                0 ml  Output                0 ml  Net  0 ml   LBM: Last BM Date: 02/09/17 Baseline Weight: Weight: 63 kg (139 lb) Most recent weight: Weight: 65.2 kg (143 lb 11.2 oz)       Palliative Assessment/Data:    Flowsheet Rows     Most Recent Value  Intake Tab  Referral Department  Hospitalist  Unit at Time of Referral  ER  Palliative Care Primary Diagnosis  Neurology  Date Notified  02/04/17  Palliative Care Type  New Palliative care  Reason for referral  End of Life Care Assistance, Clarify Goals of Care  Date of Admission  02/04/17  Date first seen by Palliative Care  02/05/17  # of days IP prior to Palliative referral  0  Clinical Assessment  Palliative Performance Scale Score  10%  Pain Max last 24 hours  Not able to report  Pain Min Last 24 hours  Not able to report  Psychosocial & Spiritual Assessment  Palliative Care Outcomes  Patient/Family meeting held?  Yes  Who was at the meeting?  husband, 3 daughters, son  Palliative Care Outcomes  Clarified goals of care      Patient Active Problem List   Diagnosis Date Noted  . S/P percutaneous endoscopic gastrostomy (PEG) tube placement ([place])   . Acute deep vein thrombosis (DVT) of popliteal vein of right lower extremity (H[facility])   . Palliative care encounter   . SIRS (systemic inflammatory response syndrome) ([place]) 02/04/2017  . Encephalopathy 02/04/2017  . Stroke ([place]) 02/04/2017  . Acute CVA (cerebrovascular accident) ([place])   . NSTEMI (non-ST elevated myocardial infarction) ([place])   . Sepsis ([place])     . Pressure injury of skin 01/24/2017  . Aspiration pneumonia ([place])   . Dizziness   . Fever   . Tracheostomy status ([place])   . Ventilator dependent ([place])   . Anemia of critical lillness 01/17/2017  . Hypomagnesemia 01/17/2017  . Cerebrovascular accident (CVA) due to thrombosis of precerebral artery ([place])   . Acute on chronic respiratory failure ([place]) s/p Feinstein Tracheostomy 01/16/17   . Cerebral embolism with cerebral infarction 01/10/2017  . Acute encephalopathy with coma 01/09/2017  . Elevated troponin 01/09/2017  . Diabetes mellitus with complication ([place]) 10/93/2355  . Hypercalcemia 01/09/2017  . Nonintractable headache   . Metastatic adenocarcinoma to liver ([place]) 12/30/2016  . Hypertension 02/21/2012  . At risk for hyperglycemia 02/21/2012  . Colon cancer ([place]) 02/21/2012    Palliative Care Assessment & Plan   Patient Profile: 76 y.o. 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   Recommendations/Plan:  I met today with patient son, April Franklin, at bedside  We discussed that we remain in a difficult position with no further medical interventions that are likely to add additional benefit.  He disagrees that her prognosis is poor.  He understand that she is reaching end of hospitalization and wants to take her home again with support of home health.  Discussed restarting tube feeds as last barrier to discharge.  RD consulted to assist with this.  Family reports having poor discharge communication last time she was discharged.  Please ensure that family is fully educated on needs at discharge.   Goals of  Care and Additional Recommendations:  Limitations on Scope  of Treatment: Full Scope Treatment until point of cardiac or respiratory arrest  Code Status:    Code Status Orders        Start     Ordered   02/04/17 0920  Do not attempt resuscitation (DNR)  Continuous    Question Answer Comment  In the event of cardiac or respiratory ARREST Do not call a "code blue"   In the event of cardiac or respiratory ARREST Do not perform Intubation, CPR, defibrillation or ACLS   In the event of cardiac or respiratory ARREST Use medication by any route, position, wound care, and other measures to relive pain and suffering. May use oxygen, suction and manual treatment of airway obstruction as needed for comfort.      02/04/17 0926    Code Status History    Date Active Date Inactive Code Status Order ID Comments User Context   02/04/2017  9:13 AM 02/04/2017  9:26 AM DNR [id number]  Radene Gunning, NP ED   02/04/2017  8:31 AM 02/04/2017  9:13 AM Partial Code [id number]  Radene Gunning, NP ED   01/14/2017 11:24 AM 01/31/2017  9:53 PM DNR [id number]  Corey Harold, NP Inpatient   01/10/2017  4:39 AM 01/14/2017 11:24 AM Full Code [id number]  Luanne Bras, MD Inpatient   01/09/2017  9:40 PM 01/10/2017  4:39 AM Full Code [id number]  Orson Eva, MD Inpatient       Prognosis:   Unable to determine, but poor overall  Discharge Planning:  Home with Home Health.  She is at extremely high risk for readmission.  Care plan was discussed with pt son April Franklin), Dr. [name]  Thank you for allowing the Palliative Medicine Team to assist in the care of this patient.   Total Time 20 Prolonged Time Billed No      Greater than 50%  of this time was spent counseling and coordinating care related to the above assessment and plan.  Micheline Rough, MD  Please contact Palliative Medicine Team phone at [phone] for questions and concerns.

## 2017-02-10 NOTE — Progress Notes (Signed)
PT Cancellation Note  Patient Details Name: April Franklin MRN: [id number] DOB: Dec 23, 1940   Cancelled Treatment:    Reason Eval/Treat Not Completed: Other (comment) (Family declined therapy.  Will check back Wed. )   [name] [name] 02/10/2017, 2:04 PM [name],PT Acute Rehabilitation [phone] (pager)

## 2017-02-10 NOTE — Care Management Important Message (Signed)
Important Message  Patient Details  Name: April Franklin MRN: [id number] Date of Birth: 02-15-1941   Medicare Important Message Given:  Yes    [name] 02/10/2017, 12:35 PM

## 2017-02-10 NOTE — Clinical Social Work Note (Addendum)
CSW consulted for residential hospice. CSW staffed with MD and RNCM. Family declined hospice residential. P/T & O/T recommending home with home health. RNCM to follow pt for d/c needs. CSW signing off as no further SW needs identified. Please reconsult if new needs arise.   Oretha Ellis, [place], [place] Work [phone]

## 2017-02-10 NOTE — Progress Notes (Signed)
Initial Nutrition Assessment  DOCUMENTATION CODES:   Not applicable  INTERVENTION:   -Initiate Glucerna 1.2 @ 20 ml/hr via PEG and increase by 10 ml every 4 hours to goal rate of 60 ml/hr.   Tube feeding regimen provides 1728 kcal (100% of needs), 86 grams of protein, and 1159 ml of H2O.   NUTRITION DIAGNOSIS:   Inadequate oral intake related to inability to eat as evidenced by NPO status.  GOAL:   Patient will meet greater than or equal to 90% of their needs  MONITOR:   Labs, TF tolerance, Skin, I & O's, Weight trends  REASON FOR ASSESSMENT:   Consult Enteral/tube feeding initiation and management  ASSESSMENT:   76 y.o. 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 Initial evaluation reveals a chest dressing acute infarct and evolutionary changes of previous infarct, elevated troponin, fever.  Pt admitted with SIRS.   Case discussed with RN. Plan to initiate TF today; if pt able to tolerate, likely discharge home later on today.   Spoke with pt's son [name]) at bedside. He confirmed pt was tolerating TF well at home PTA, however, came to the hospital after PEG started leaking. He is mainly concerned that PEG is functioning correctly (per RN, PEG is functioning without difficulty). He is unsure of TF regimen at home, but reports pt was receiving Glucerna 1.2. Called [facility] to confirm home regimen; pt was receiving continuous feedings of Glucerna 1.2 @ 60 ml/hr and 240 ml free water flush 4 times daily. Complete regimen provides  Case discussed with RN, who  reports Reglan has been ordered to assist with gastric emptying.   Palliative care following; family declining hospice services at this time and are hopeful for full recovery.   Nutrition-Focused physical exam completed. Findings are no fat depletion, mild to moderate muscle depletion, and mild edema.   Labs reviewed: CBGS: 138-271.  Diet Order:   NPO  Skin:  Reviewed, no issues  Last BM:  02/09/17  Height:   Ht Readings from Last 1 Encounters:  02/04/17 5\' 2"  (1.575 m)    Weight:   Wt Readings from Last 1 Encounters:  02/04/17 143 lb 11.2 oz (65.2 kg)    Ideal Body Weight:  50 kg   BMI:  Body mass index is 26.28 kg/m.  Estimated Nutritional Needs:   Kcal:  1600-1800  Protein:  80-95 grams  Fluid:  1.6-1.8 L  EDUCATION NEEDS:   No education needs identified at this time  Rusty Villella A. Jimmye Norman, RD, LDN, CDE Pager: [phone] After hours Pager: [phone]

## 2017-02-10 NOTE — Care Management Note (Signed)
Case Management Note  Patient Details  Name: LORELIE BIERMANN MRN: [id number] Date of Birth: 1941-02-26  Subjective/Objective:                    Action/Plan:  Spoke to patient's son [name] [phone] at bedside. He plans to take patient home with [facility] and trach and tube feedings. They have all supplies at home already. HH re ordered , Santiago Glad with [facility] aware .   [name] gave home address is: [address] [place],  [address]  Patient will need ambulance transportation home. Expected Discharge Date:                  Expected Discharge Plan:  [place]  In-House Referral:     Discharge planning Services  CM Consult  Post Acute Care Choice:  Home Health, Durable Medical Equipment Choice offered to:  Adult Children  DME Arranged:  Tube feeding DME Agency:  [place] Arranged:  RN, PT, OT, Nurse's Aide, Speech Therapy, Social Work [employer] Agency:  [place]  Status of Service:  In process, will continue to follow  If discussed at Long Length of Stay Meetings, dates discussed:    Additional Comments:  Marilu Favre, RN 02/10/2017, 11:18 AM

## 2017-02-10 NOTE — Progress Notes (Signed)
April Franklin  KPT:[id number] DOB: [birth year] DOA: 02/04/2017 PCP: [name], MD    Brief Narrative:  76 y.o. 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 Initial evaluation reveals a chest dressing acute infarct and evolutionary changes of previous infarct, elevated troponin, fever.  Information is obtained from the family is at the bedside as patient is nonverbal. She was discharged 4 days ago from the hospital and daughter states she was "quite sleepy at that point". During the 4 days at home she developed intermittent fevers and worsening lethargy. Daughter reports noting leaking around her G-tube when she was "giving her Tylenol for her fever" so he came to the hospital. Does indicate they have been administering medications through her G-tube. They note she's been bedbound since her discharge they deny any diarrhea, any vomiting or indications of discomfort. Do note some increased coughing and secretion production. He reports she is not on oxygen at home. Called EMS he reports a temperature of 102.  Assessment & Plan:   Principal Problem:   SIRS (systemic inflammatory response syndrome) (HCC) Active Problems:   Hypertension   Elevated troponin   Diabetes mellitus with complication (HCC)   Acute on chronic respiratory failure ([place]) s/p Feinstein Tracheostomy 01/16/17   Metastatic adenocarcinoma to liver (HCC)   Anemia of critical lillness   Tracheostomy status (HCC)   Fever   Encephalopathy   Stroke ([place])   S/P percutaneous endoscopic gastrostomy (PEG)  tube placement ([place])   Acute deep vein thrombosis (DVT) of popliteal vein of right lower extremity ([place])   Palliative care encounter  #1. Suspected aspiration pneumonitis with sepsis present on admission. Fever, leukocytosis, elevated lactic acid, tachypnea, tachycardia,  elevated troponin with normal EKG, worsening LFTs. Code sepsis initiated. Fluids provided broad-spectrum antibiotics initiated. Urine shows no signs of infection chest x-ray shows no infiltrate currently. suspect respiratory source given AMS in the setting of PEG tube concern for aspiration. Family is aware of worsened condition. -Blood cx noted to be neg -Pt had been continued on empiric abx. Have transitioned to empiric per tube augmentin to complete course -Palliative care is following. Greatly appreciate assistance. See below  #2. Acute on chronic respiratory failure. Status post tracheostomy last hospitalization. Also treated for aspiration pneumonia last hospitalization No oxygen needs at home. Trach collar remains in place. Patient with tachypnea oxygen saturation level 92% with venti mask. X-ray without obvious infiltrate per my review -Patient successfully weaned to minimal O2 support for discharge  3. Acute on chronic encephalopathy. Likely related to acute stroke with extended stroke in setting of possible aspiration pna and MI.  Patient discharged 4 days ago after being hospitalized for stroke as noted above. Last hospitalization underwent mechanical thrombectomy. CT on admit demonstrated progressing acute infarct on left and evolutionary changes of previous infarct on right. Recent echo with an EF of 60-65%. Recent carotid Doppler with right stenosis. -Stroke team consulted. During this course, had discussed case with Dr. Erlinda Hong. Overall poor prognosis. -Have stopped aspirin. See below for details - Have further discussed with Dr. Erlinda Hong on 3/16 who agreed to stop aspirin - Eyes now open, pt looking around room  #4.  Elevated troponin concern for NSTEMI.  EKG without ST changes evident. -Troponin has peaked to 5.84 -Most recent trop 4.98 -Had earlier discussed with on-call Cardiologist, Dr. Gwenlyn Found, who states that patient is not a candidate for aggressive intervention, such as heart cath, given severity of CVA and other medical issues.  -Patient was initially continued on ASA.  -Aspirin now stopped secondary to hgb in the 7 range (was over 9 on 3/14). - Seems stable at present  - As sbp is now in the 140 range, will start low dose beta blocker  5. Hypertension. Blood pressure the low end of normal upon presentation. Home medications include losartan. -Held losartan initially given soft BP at presentation -IV fluids as noted above -Currently stable -Given concerns for NSTEMI and CVA, will start low dose metoprolol per tube  #6. Diabetes. Serum glucose 197 on admission. Recent hemoglobin A1c 7.2 -Sliding scale insulin every 4 hours has been ordered -lantus initially on hold.  -Stable at present - As tube feeds are to be resumed, will resume lantus  #7. Metastatic adenocarcinoma to the liver. Increase in LFT's on admit - Likely secondary to metastatic colon cancer - Presently seems stable  #8. End of Life: - Appreciate input by Palliative Care. Discussed case with Dr. Domingo Cocking. Overall poor prognosis. After lengthy family discussion, decision was made for DNR but with no escalation of care. - Hgb dropped to the 7 range, thus Lovenox and ASA stopped. No obvious signs of bleeding at this time. Family has been aware of stopping antiplatelet and anticoaguation given concerns of drop in hgb - Discussed case with Dr. Domingo Cocking. Greatly appreciate assistance. See input by Palliative Care. Pt DNR. Plan now for discharge planning with home health  #9. DVT - LE DVT noted at last admit - Had started lovenox, however hgb now in the 7 range with no signs of obvious bleed. - Lovenox was stopped. Family has been  aware and agrees with stopping lovenox given bleeding risk - Seems stable  #10 Suspected acute blood loss anemia - Unclear source - Sudden drop in hgb noted after starting asa and prophylactic dosed lovenox, both now stopped per above - Have discussed case with GI who does not recommend endoscopic intervention given significant cardiovascular risk involved  DVT prophylaxis: Lovenox subQ Code Status: DNR Family Communication: Pt in room, family at bedside Disposition Plan: Uncertain at this time  Consultants:   Palliative Care  Neurology  Procedures:     Antimicrobials: Anti-infectives    Start     Dose/Rate Route Frequency Ordered Stop   02/08/17 1315  amoxicillin-clavulanate (AUGMENTIN) 875-125 MG per tablet 1 tablet     1 tablet Per Tube Every 12 hours 02/08/17 1300     02/04/17 2000  vancomycin (VANCOCIN) 500 mg in sodium chloride 0.9 % 100 mL IVPB  Status:  Discontinued     500 mg 100 mL/hr over 60 Minutes Intravenous Every 12 hours 02/04/17 0602 02/07/17 1815   02/04/17 1400  piperacillin-tazobactam (ZOSYN) IVPB 3.375 g  Status:  Discontinued     3.375 g 12.5 mL/hr over 240 Minutes Intravenous Every 8 hours 02/04/17 0602 02/07/17 1815   02/04/17 0600  piperacillin-tazobactam (ZOSYN) IVPB 3.375 g     3.375 g 100 mL/hr over 30 Minutes Intravenous  Once 02/04/17 0556 02/04/17 0741   02/04/17 0600  vancomycin (VANCOCIN) IVPB 1000 mg/200 mL premix     1,000 mg 200 mL/hr over 60 Minutes Intravenous  Once 02/04/17 0556 02/04/17 0810      Subjective: Cannot assess as  pt is nonverbal  Objective: Vitals:   02/10/17 0030 02/10/17 0811 02/10/17 1246 02/10/17 1522  BP:      Pulse: 81 82 81 91  Resp: 18 18 18 18   Temp:      TempSrc:      SpO2: 95% 94% 94% 93%  Weight:      Height:        Intake/Output Summary (Last 24 hours) at 02/10/17 1625 Last data filed at 02/10/17 1239  Gross per 24 hour  Intake                0 ml  Output                0 ml  Net                 0 ml   Filed Weights   02/04/17 0541 02/04/17 1846  Weight: 63 kg (139 lb) 65.2 kg (143 lb 11.2 oz)    Examination:  General exam: in nad, laying in bed, non-verbal Respiratory system: normal resp effort, no wheezing Cardiovascular system: regular rhythm, s1-s2 Gastrointestinal system: PEG remains in place, nondistended Central nervous system: no active tremors, L sided facial droop Extremities: perfused, no joint deformities Skin: no rashes, no pallor Psychiatry:unable to determine as patient is nonverbal at baseline  Data Reviewed: I have personally reviewed following labs and imaging studies  CBC:  Recent Labs Lab 02/04/17 0557 02/04/17 0631 02/06/17 0409  WBC 14.4*  --  6.9  NEUTROABS 11.7*  --   --   HGB 9.5* 9.9* 7.9*  HCT 30.7* 29.0* 26.2*  MCV 91.6  --  91.9  PLT 177  --  662*   Basic Metabolic Panel:  Recent Labs Lab 02/04/17 0557 02/04/17 0631 02/06/17 0409  NA 137 137 144  K 5.1 5.1 3.6  CL 103 105 109  CO2 27  --  26  GLUCOSE 195* 197* 192*  BUN 20 26* 17  CREATININE 0.93 0.90 0.83  CALCIUM 10.8*  --  9.9   GFR: Estimated Creatinine Clearance: 52.7 mL/min (by C-G formula based on SCr of 0.83 mg/dL). Liver Function Tests:  Recent Labs Lab 02/04/17 0557  AST 156*  ALT 135*  ALKPHOS 460*  BILITOT 1.5*  PROT 6.2*  ALBUMIN 2.2*   No results for input(s): LIPASE, AMYLASE in the last 168 hours. No results for input(s): AMMONIA in the last 168 hours. Coagulation Profile: No results for input(s): INR, PROTIME in the last 168 hours. Cardiac Enzymes:  Recent Labs Lab 02/04/17 0557 02/04/17 1211  TROPONINI 3.91* 4.98*   BNP (last 3 results) No results for input(s): PROBNP in the last 8760 hours. HbA1C: No results for input(s): HGBA1C in the last 72 hours. CBG:  Recent Labs Lab 02/09/17 1203 02/09/17 1725 02/09/17 2155 02/10/17 0825 02/10/17 1127  GLUCAP 135* 138* 177* 271* 268*   Lipid Profile: No results for input(s):  CHOL, HDL, LDLCALC, TRIG, CHOLHDL, LDLDIRECT in the last 72 hours. Thyroid Function Tests: No results for input(s): TSH, T4TOTAL, FREET4, T3FREE, THYROIDAB in the last 72 hours. Anemia Panel: No results for input(s): VITAMINB12, FOLATE, FERRITIN, TIBC, IRON, RETICCTPCT in the last 72 hours. Sepsis Labs:  Recent Labs Lab 02/04/17 9476 02/04/17 0859  LATICACIDVEN 2.23* 1.79    Recent Results (from the past 240 hour(s))  Blood Culture (routine x 2)     Status: None   Collection Time: 02/04/17  5:40 AM  Result Value Ref Range Status   Specimen  Description BLOOD RIGHT ARM  Final   Special Requests AEROBIC BOTTLE ONLY 10ML  Final   Culture NO GROWTH 5 DAYS  Final   Report Status 02/09/2017 FINAL  Final  Blood Culture (routine x 2)     Status: None   Collection Time: 02/04/17  5:57 AM  Result Value Ref Range Status   Specimen Description BLOOD RIGHT HAND  Final   Special Requests BOTTLES DRAWN AEROBIC AND ANAEROBIC 5ML  Final   Culture NO GROWTH 5 DAYS  Final   Report Status 02/09/2017 FINAL  Final  Urine culture     Status: None   Collection Time: 02/04/17  7:06 AM  Result Value Ref Range Status   Specimen Description URINE, RANDOM  Final   Special Requests NONE  Final   Culture NO GROWTH  Final   Report Status 02/05/2017 FINAL  Final     Radiology Studies: No results found.  Scheduled Meds: . amoxicillin-clavulanate  1 tablet Per Tube Q12H  . guaiFENesin  1,200 mg Oral BID  . insulin aspart  0-15 Units Subcutaneous TID WC  . insulin aspart  0-5 Units Subcutaneous QHS  . metoCLOPramide  10 mg Oral TID AC & HS  . nystatin cream   Topical BID   Continuous Infusions: . sodium chloride 50 mL/hr at 02/09/17 1529  . feeding supplement (GLUCERNA 1.2 CAL) 1,000 mL (02/10/17 1216)     LOS: 6 days   Courtlynn Holloman, Orpah Melter, MD [facility] Pager [phone]  If 7PM-7AM, please contact night-coverage [email or web address] Password [facility] 02/10/2017, 4:25 PM

## 2017-02-11 ENCOUNTER — Encounter (HOSPITAL_COMMUNITY): Payer: Self-pay

## 2017-02-11 LAB — GLUCOSE, CAPILLARY
GLUCOSE-CAPILLARY: 228 mg/dL — AB (ref 65–99)
GLUCOSE-CAPILLARY: 154 mg/dL — AB (ref 65–99)
Glucose-Capillary: 168 mg/dL — ABNORMAL HIGH (ref 65–99)

## 2017-02-11 MED ORDER — INSULIN DEGLUDEC 100 UNIT/ML ~~LOC~~ SOPN: 20.0000 [IU] | PEN_INJECTOR | Freq: Every day | SUBCUTANEOUS | 0 refills | Status: DC

## 2017-02-11 MED ORDER — GUAIFENESIN ER 600 MG PO TB12: 1200.0000 mg | ORAL_TABLET | Freq: Two times a day (BID) | ORAL | 0 refills | Status: AC

## 2017-02-11 MED ORDER — INSULIN GLARGINE 100 UNIT/ML SOLOSTAR PEN: 20.0000 [IU] | PEN_INJECTOR | Freq: Every day | SUBCUTANEOUS | 0 refills | Status: DC

## 2017-02-11 MED ORDER — PEN NEEDLES 32G X 5 MM MISC: 1.0000 | 0 refills | Status: AC

## 2017-02-11 MED ORDER — MUPIROCIN 2 % EX OINT
TOPICAL_OINTMENT | CUTANEOUS | Status: AC
  Administered 2017-02-11: 13:00:00
  Filled 2017-02-11: qty 22

## 2017-02-11 MED ORDER — INSULIN ASPART 100 UNIT/ML FLEXPEN: 8.0000 [IU] | PEN_INJECTOR | SUBCUTANEOUS | 0 refills | Status: AC

## 2017-02-11 MED ORDER — PANTOPRAZOLE SODIUM 40 MG PO PACK: 40.0000 mg | PACK | Freq: Every day | ORAL | 0 refills | Status: AC

## 2017-02-11 MED ORDER — CHLORHEXIDINE GLUCONATE 0.12 % MT SOLN: 15.0000 mL | Freq: Two times a day (BID) | OROMUCOSAL | 0 refills | Status: AC

## 2017-02-11 MED ORDER — LOSARTAN POTASSIUM 25 MG PO TABS: 25.0000 mg | ORAL_TABLET | Freq: Every day | ORAL | 0 refills | Status: DC

## 2017-02-11 MED ORDER — METOPROLOL TARTRATE 25 MG PO TABS: 12.5000 mg | ORAL_TABLET | Freq: Two times a day (BID) | ORAL | 0 refills | Status: DC

## 2017-02-11 NOTE — Discharge Summary (Addendum)
Physician Discharge Summary  April Franklin CBU:[id number] DOB: [birth year] DOA: 02/04/2017  PCP: [name], MD  Admit date: 02/04/2017 Discharge date: 02/11/2017  Admitted From: Home Disposition:  Home  Recommendations for Outpatient Follow-up:  1. Follow up with PCP in 1-2 weeks 2. Follow up with Neurology as scheduled  Dietitian recommendations for tube feeds: -Initiate Glucerna 1.2 @ 20 ml/hr via PEG and increase by 10 ml every 4 hours to goal rate of 60 ml/hr.   Tube feeding regimen provides 1728 kcal (100% of needs), 86 grams of protein, and 1159 ml of H2O.     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 Initial evaluation reveals a chest dressing acute infarct and evolutionary changes of previous infarct, elevated troponin, fever.  Information is obtained from the family is at the bedside as patient is nonverbal. She was discharged 4 days ago from the hospital and daughter states she was "quite sleepy at that point". During the 4 days at home she developed intermittent fevers and worsening lethargy. Daughter reports noting leaking around her G-tube when she was "giving her Tylenol for her fever" so he came to the hospital. Does indicate they have been administering medications through her G-tube.They note she's been bedbound since her discharge they deny any diarrhea, any vomiting or indications of discomfort. Do note some increased coughing and  secretion production. He reports she is not on oxygen at home. Called EMS he reports a temperature of 102.  #1. Aspiration pneumonitis with sepsis present on admission. Fever, leukocytosis, elevated lactic acid, tachypnea, tachycardia, elevated troponin with normal EKG, worsening LFTs. Code sepsis initiated. Fluids provided broad-spectrum antibiotics initiated. Urine shows no signs of infection chest x-ray shows no infiltrate. Suspectrespiratory source given AMS in the setting of PEG tube concern for aspiration. Family is aware of worsened condition. -Blood cx noted to be neg -Pt had been continued on empiric abx. Have transitioned to empiric per tube augmentin to complete course. Patient noted to have diarrhea, likely secondary to augmentin. Discussed with pharmacy. Patient received one dose of levaquin to complete 8 days of tx -Palliative care had been following. Greatly appreciate assistance. See below  #2. Acute on chronic respiratory failure. Status post tracheostomy last hospitalization. Also treated for aspiration pneumonia last hospitalization No oxygen needs at home. Trach collar remains in place.Patient with tachypnea oxygen saturation level 92% with ventimask. X-ray without obvious infiltrate per my review -Patient successfully continued on minimal O2 support for discharge  3. Acute on chronicencephalopathy. Likely related to acute stroke with extended stroke in setting of possible aspiration pna and MI. Patient discharged 4 days ago after being hospitalized for stroke as noted above. Last hospitalization underwent mechanical thrombectomy.CT on admit demonstrated progressing acute infarct on left and evolutionary changes of previous infarct on right. Recent echo with an EF of 60-65%. Recent carotid Doppler with right stenosis. -Stroke team consulted. During this course, had discussed case with Dr. Erlinda Hong. Overall poor prognosis. -Have stopped aspirin. See below for details - Have  further discussed with Dr. Erlinda Hong on 3/16 who agreed to stop aspirin - Eyes now open, pt looking around room  #4. Elevated  troponin, NSTEMI. EKG without ST changes evident. -Troponin has peaked to 5.84 -Most recent trop 4.98 -Had earlier discussed with on-call Cardiologist, Dr. Gwenlyn Found, who states that patient is not a candidate for aggressive intervention, such as heart cath, given severity of CVA and other medical issues.  -Patient was initially continued on ASA.  -Aspirin now stopped secondary to hgb in the 7 range (was over 9 on 3/14). - Seems stable at present  - Have resumed lower dosed ARB with addition of beta blocker as blood pressure now tolerates  5. Hypertension. Blood pressure the low end of normal upon presentation. Home medications include losartan. -Held losartan initially given soft BP at presentation -IV fluids as noted above -Currently stable -Given concerns for NSTEMI and CVA, have started low dose metoprolol per tube - Will continue lower dosed losartan at time of discharge  #6. Diabetes. Serum glucose 197 on admission. Recent hemoglobin A1c 7.2 -Sliding scale insulin every 4 hours has been ordered -lantus initially on hold.  -Stable at present - As tube feeds are resumed, have resumed home insulin  #7. Metastatic adenocarcinoma to the liver. Increase in LFT's on admit - Likely secondary to metastatic colon cancer - Presently seems stable  #8. End of Life: - Appreciate input by Palliative Care. Discussed case with Dr. Domingo Cocking. Overall poor prognosis. After lengthy family discussion, decision was made for DNR but with no escalation of care. - Hgb dropped to the 7 range, thus Lovenox and ASA stopped. No obvious signs of bleeding at this time. Family has been aware of stopping antiplatelet and anticoaguation given concerns of drop in hgb - Discussed case with Dr. Domingo Cocking. Greatly appreciate assistance. See input by Palliative Care. Pt DNR. Plan now for discharge  planning with home health  #9. DVT - LE DVT noted at last admit - Had started lovenox, however hgb now in the 7 range with no signs of obvious bleed. - Lovenox was stopped. Family has been aware and agrees with stopping lovenox given bleeding risk - Seems stable  #10 Suspected acute blood loss anemia - Unclear source - Sudden drop in hgb noted after starting asa and prophylactic dosed lovenox, both now stopped per above - Have discussed case with GI who does not recommend endoscopic evaluation given significant cardiovascular risk involved  Discharge Diagnoses:  Principal Problem:   SIRS (systemic inflammatory response syndrome) (HCC) Active Problems:   Hypertension   Elevated troponin   Diabetes mellitus with complication (HCC)   Acute on chronic respiratory failure ([place]) s/p Feinstein Tracheostomy 01/16/17   Metastatic adenocarcinoma to liver ([place])   Anemia of critical lillness   Tracheostomy status (HCC)   Fever   Encephalopathy   Stroke ([place])   S/P percutaneous endoscopic gastrostomy (PEG) tube placement ([place])   Acute deep vein thrombosis (DVT) of popliteal vein of right lower extremity [facility])   Palliative care encounter    Discharge Instructions  Discharge Instructions    Ambulatory referral to Neurology    Complete by:  As directed    An appointment is requested in approximately: 4 weeks     Allergies as of 02/11/2017   No Known Allergies     Medication List    STOP taking these medications   enoxaparin 60 MG/0.6ML injection Commonly known as:  LOVENOX   insulin aspart 100 UNIT/ML injection Commonly known as:  novoLOG Replaced by:  insulin aspart 100 UNIT/ML FlexPen   insulin glargine 100 UNIT/ML injection Commonly known as:  LANTUS   potassium chloride  20 MEQ/15ML (10%) Soln   sennosides 8.8 MG/5ML syrup Commonly known as:  SENOKOT   sodium phosphate 7-19 GM/118ML Enem     TAKE these medications   chlorhexidine 0.12 % solution Commonly known  as:  PERIDEX 15 mLs by Mouth Rinse route 2 (two) times daily.   docusate 50 MG/5ML liquid Commonly known as:  COLACE Place 5 mLs (50 mg total) into feeding tube at bedtime.   free water Soln Place 240 mLs into feeding tube every 6 (six) hours.   guaiFENesin 600 MG 12 hr tablet Commonly known as:  MUCINEX Take 2 tablets (1,200 mg total) by mouth 2 (two) times daily.   insulin aspart 100 UNIT/ML FlexPen Commonly known as:  NOVOLOG Inject 8 Units into the skin every 4 (four) hours. Replaces:  insulin aspart 100 UNIT/ML injection   insulin degludec 100 UNIT/ML Sopn FlexTouch Pen Commonly known as:  TRESIBA Inject 0.2 mLs (20 Units total) into the skin daily at 10 pm. PCP to assist with prior authorization if needed   losartan 25 MG tablet Commonly known as:  COZAAR Place 1 tablet (25 mg total) into feeding tube daily. What changed:  medication strength  how much to take  how to take this   magnesium hydroxide 400 MG/5ML suspension Commonly known as:  MILK OF MAGNESIA Place 30 mLs into feeding tube daily as needed for mild constipation.   metoprolol tartrate 25 MG tablet Commonly known as:  LOPRESSOR Place 0.5 tablets (12.5 mg total) into feeding tube 2 (two) times daily.   pantoprazole sodium 40 mg/20 mL Pack Commonly known as:  PROTONIX Place 20 mLs (40 mg total) into feeding tube daily.   Pen Needles 32G X 5 MM Misc 1 Device by Does not apply route See admin instructions. For use with insulin pens. Additional refills per PCP            Durable Medical Equipment        Start     Ordered   02/10/17 1101  For home use only DME Tube feeding  Once    Comments:  continuous feedings of Glucerna 1.2 @ 60 ml/hr and 240 ml free water flush 4 times daily  -Initiate Glucerna 1.2 @ 20 ml/hr via PEG and increase by 10 ml every 4 hours to goal rate of 60 ml/hr.   Tube feeding regimen provides 1728 kcal (100% of needs), 86 grams of protein, and 1159 ml of H2O.    NUTRITION DIAGNOSIS:   Inadequate oral intake related to inability to eat as evidenced by NPO status.  GOAL:   Patient will meet greater than or equal to 90% of their needs Patient will need tube feedings greater than 90 days   02/10/17 1103     Follow-up Information    HUSAIN,KARRAR, MD. Schedule an appointment as soon as possible for a visit in 1 week(s).   Specialty:  Internal Medicine Contact information: [address] [place] [address] [phone]        Antony Contras, MD. Schedule an appointment as soon as possible for a visit.   Specialties:  Neurology, Radiology Why:  as scheduled Contact information: [address] [place] [address] [phone]          No Known Allergies  Consultations:  Palliative Care  Neurology  Discussed with Cardiology  Discussed with GI  Procedures/Studies: Ct Abdomen Wo Contrast  Result Date: 01/25/2017 CLINICAL DATA:  Recent PEG placement and removal by patient. A  Foley catheter has been placed. Evaluate PEG site. EXAM: CT ABDOMEN WITHOUT CONTRAST TECHNIQUE: Multidetector CT imaging of the abdomen was performed following the standard protocol without IV contrast. COMPARISON:  Abdominal MRI 12/09/2016 FINDINGS: Lower chest: Small pleural effusions, greater on the right. Atelectasis at the bases. Hepatobiliary: Multiple hepatic metastases. Cholelithiasis without signs of acute cholecystitis. Pancreas: Negative Spleen: Splenomegaly. When accounting for streak artifact there is no definite focal abnormality. Adrenals/Urinary Tract: No definite renal abnormality. No hydronephrosis. Stomach/Bowel: Percutaneous gastrostomy site has been cannulated with a Foley catheter which is in good position. The stomach is appropriately apposed to the abdominal wall. Abdominal wall reticulation around the gastrostomy site, with minimal gas, not unexpected given the history. There is no evidence of abdominal  wall fluid collection. No dissecting soft tissue gas. Vascular/Lymphatic: Trace ascites. No pneumoperitoneum. No visualized bowel obstruction. Other: Negative Musculoskeletal: No acute finding IMPRESSION: 1. Recent percutaneous gastrostomy with Foley in place of the displaced catheter. The Foley is in good position in the stomach is apposed to the abdominal wall. Mild soft tissue reticulation and gas at the PEG site without visible fluid collection. 2. Small ascites without pneumoperitoneum. Electronically Signed   By: Monte Fantasia M.D.   On: 01/25/2017 17:08   Ct Head Wo Contrast  Result Date: 02/04/2017 CLINICAL DATA:  Sepsis.  Unresponsive for several days. EXAM: CT HEAD WITHOUT CONTRAST TECHNIQUE: Contiguous axial images were obtained from the base of the skull through the vertex without intravenous contrast. COMPARISON:  01/31/2017 FINDINGS: Brain: Diffuse cerebral atrophy. Mild ventricular dilatation consistent with central atrophy. Low-attenuation changes in the deep white matter with old lacunar infarcts consistent with small vessel ischemic change. Area of developing encephalomalacia in the right occipital lobe and right greater than left cerebellar hemispheres consistent with evolving infarcts. There is increased low-attenuation change in sulcal effacement in the left occipital lobe since previous study suggesting developing subacute infarct. The area of involvement appears more prominent than on the recent MRI. No midline shift. No abnormal extra-axial fluid collections. Gray-white matter junctions are distinct. Basal cisterns are not effaced. No acute intracranial hemorrhage. Vascular: Vascular calcifications are present. Skull: Calvarium appears intact. Sinuses/Orbits: Visualized paranasal sinuses and mastoid air cells are not opacified. Other: None. IMPRESSION: Developing low-attenuation and sulcal effacement in the left occipital lobe suggesting progressing acute infarct. Expected evolutionary  changes of previous infarct in the right occipital region and bilateral cerebellum. No acute intracranial hemorrhage or significant mass effect. Electronically Signed   By: Lucienne Capers M.D.   On: 02/04/2017 06:42   Ct Head Wo Contrast  Result Date: 01/31/2017 CLINICAL DATA:  Change in mental status. EXAM: CT HEAD WITHOUT CONTRAST TECHNIQUE: Contiguous axial images were obtained from the base of the skull through the vertex without intravenous contrast. COMPARISON:  01/10/2017 FINDINGS: Brain: Expected progressive low-density and loss of mass-effect in the areas of late subacute infarct seen previously. The most confluent infarcts are in the right cerebellum and right occipital lobe, where there is also fading petechial hemorrhage. Small infarcts along the cerebral watershed at the vertex are largely underestimated relative to prior MRI. Small patchy subacute infarcts in the left cerebellum. Improved patency of the fourth ventricle. No hydrocephalus. No detected new infarct. Vascular: No asymmetric hyperdense vessel. Atherosclerotic calcification. Skull: No acute or aggressive finding Sinuses/Orbits: Gaze to the left. IMPRESSION: 1. No acute finding. 2. Expected evolution of recent extensive cerebral and cerebellar infarction. No evidence of infarct progression. Known petechial hemorrhage without progression. 3. Decrease cytotoxic edema in the  posterior fossa with normalized fourth ventricle. Electronically Signed   By: Monte Fantasia M.D.   On: 01/31/2017 13:16   Dg Abdomen Peg Tube Location  Result Date: 02/04/2017 CLINICAL DATA:  Gastrostomy catheter placement EXAM: ABDOMEN - 1 VIEW COMPARISON:  Study obtained earlier in the day. FINDINGS: Contrast, 50 mL Isovue-300, was injected into a gastrostomy catheter. The gastrostomy catheter is positioned in the gastric antrum. Contrast flows into the stomach without contrast extravasation. Bowel gas pattern is normal. No free air. IMPRESSION: Gastrostomy  catheter positioning gastric antrum. No contrast extravasation evident. Electronically Signed   By: Lowella Grip III M.D.   On: 02/04/2017 17:09   Dg Chest Port 1 View  Result Date: 02/04/2017 CLINICAL DATA:  Sepsis, shortness of breath. History of colon cancer, recent for percutaneous gastrostomy tube placement. EXAM: PORTABLE CHEST - 1 VIEW; PORTABLE ABDOMEN - 1 VIEW COMPARISON:  CT abdomen and pelvis January 25, 2017 inch chest radiograph January 25, 2017 FINDINGS: Cardiac silhouette is upper limits of normal, mediastinal silhouette is nonsuspicious for this low inspiratory examination with crowded vasculature markings. Patient rotated to the RIGHT, unfurling the aorta. RIGHT mid lung zone bandlike density. Pleural effusions or focal consolidations. Trachea projects midline and there is no pneumothorax. Tracheostomy tube tip projects 3 cm above the carina. Included soft tissue planes and osseous structures are non-suspicious. Paucity of bowel gas on this single upright view. Single gas-filled nondistended loops below bowel in the LEFT lower quadrant. No intra-abdominal mass effect or pathologic calcifications. No intraperitoneal free air. Soft tissue planes and included osseous structures are nonsuspicious. IMPRESSION: RIGHT midlung zone atelectasis/scarring. Borderline cardiomegaly. Nonspecific bowel gas pattern. Electronically Signed   By: Elon Alas M.D.   On: 02/04/2017 06:18   Dg Chest Port 1 View  Result Date: 01/25/2017 CLINICAL DATA:  Respiratory failure EXAM: PORTABLE CHEST 1 VIEW COMPARISON:  Chest radiograph from one day prior. FINDINGS: Tracheostomy tube tip overlies the tracheal air column 3.2 cm above the carina. Stable cardiomediastinal silhouette with mild cardiomegaly and aortic atherosclerosis. No pneumothorax. No pleural effusion. Mild hazy right parahilar lung opacity appears stable. No new consolidative airspace disease. IMPRESSION: 1. Well-positioned tracheostomy tube. 2. Stable  mild cardiomegaly. Stable mild hazy right parahilar lung opacity, favor atelectasis and/or mild pulmonary edema. 3. Aortic atherosclerosis. Electronically Signed   By: Ilona Sorrel M.D.   On: 01/25/2017 08:59   Dg Chest Port 1 View  Result Date: 01/24/2017 CLINICAL DATA:  Respiratory distress EXAM: PORTABLE CHEST 1 VIEW COMPARISON:  January 20, 2017 FINDINGS: Tracheostomy catheter tip is 2.7 cm above the carina. No pneumothorax. There has been partial clearing of airspace consolidation from the right base. Atelectatic appearing opacity is noted in the right mid lung. There is also mild atelectasis in the left base. No new opacity evident. Heart is mildly enlarged with pulmonary vascularity within normal limits. No adenopathy. There is atherosclerotic calcification in the aorta. No bone lesions peer IMPRESSION: Tracheostomy catheter as described. No pneumothorax. Partial clearing right base patchy airspace opacity. Atelectatic change remains in the right mid lung as well as in the left base. No new opacity. Stable cardiac prominence. Aortic atherosclerosis present. Electronically Signed   By: Lowella Grip III M.D.   On: 01/24/2017 07:29   Dg Chest Port 1 View  Result Date: 01/20/2017 CLINICAL DATA:  Fever, history of tracheostomy EXAM: PORTABLE CHEST 1 VIEW COMPARISON:  01/16/2017 FINDINGS: Borderline cardiomegaly. Central vascular congestion without convincing pulmonary edema. Tracheostomy tube in place. Trace bilateral pleural effusion.  Hazy right basilar atelectasis or infiltrate. IMPRESSION: Central vascular congestion without pulmonary edema. Borderline cardiomegaly. Trace bilateral pleural effusion. Hazy right basilar atelectasis or early infiltrate. Tracheostomy tube in place. Electronically Signed   By: Lahoma Crocker M.D.   On: 01/20/2017 07:50   Dg Chest Port 1 View  Result Date: 01/16/2017 CLINICAL DATA:  Tracheostomy placement. Its patient has sustained a CVA. EXAM: PORTABLE CHEST 1 VIEW  COMPARISON:  Portable chest x-ray of earlier today FINDINGS: The endotracheal tube has been removed and a tracheostomy appliance placed today. The tip of the tube lies approximately 12 mm below the inferior margin of the clavicular heads. The lungs are reasonably well inflated. There is no infiltrate or atelectasis. There is no pleural effusion. The heart and pulmonary vascularity are normal. IMPRESSION: No complication following tracheostomy tube placement. Electronically Signed   By: David  Martinique M.D.   On: 01/16/2017 10:55   Dg Chest Port 1 View  Result Date: 01/16/2017 CLINICAL DATA:  Intubation. EXAM: PORTABLE CHEST 1 VIEW COMPARISON:  01/14/2017 . FINDINGS: Endotracheal tube and NG tube in stable position. Heart size stable. Normal pulmonary vascularity. No focal infiltrate. No pleural effusion or pneumothorax. IMPRESSION: 1. Lines and tubes in stable position. 2.  No acute cardiopulmonary disease P Electronically Signed   By: Marcello Moores  Register   On: 01/16/2017 07:24   Dg Chest Port 1 View  Result Date: 01/14/2017 CLINICAL DATA:  Respiratory failure. EXAM: PORTABLE CHEST 1 VIEW COMPARISON:  01/12/2017 FINDINGS: Endotracheal tube is 2.4 cm above the carina. Nasogastric tube extends into the abdomen. Both lungs are clear without airspace disease or pulmonary edema. Negative for a pneumothorax. Heart size is normal. Atherosclerotic calcifications at the aortic arch. IMPRESSION: No focal lung disease. Support apparatuses as described. Electronically Signed   By: Markus Daft M.D.   On: 01/14/2017 07:55   Dg Abd Portable 1 View  Result Date: 02/04/2017 CLINICAL DATA:  Sepsis, shortness of breath. History of colon cancer, recent for percutaneous gastrostomy tube placement. EXAM: PORTABLE CHEST - 1 VIEW; PORTABLE ABDOMEN - 1 VIEW COMPARISON:  CT abdomen and pelvis January 25, 2017 inch chest radiograph January 25, 2017 FINDINGS: Cardiac silhouette is upper limits of normal, mediastinal silhouette is nonsuspicious  for this low inspiratory examination with crowded vasculature markings. Patient rotated to the RIGHT, unfurling the aorta. RIGHT mid lung zone bandlike density. Pleural effusions or focal consolidations. Trachea projects midline and there is no pneumothorax. Tracheostomy tube tip projects 3 cm above the carina. Included soft tissue planes and osseous structures are non-suspicious. Paucity of bowel gas on this single upright view. Single gas-filled nondistended loops below bowel in the LEFT lower quadrant. No intra-abdominal mass effect or pathologic calcifications. No intraperitoneal free air. Soft tissue planes and included osseous structures are nonsuspicious. IMPRESSION: RIGHT midlung zone atelectasis/scarring. Borderline cardiomegaly. Nonspecific bowel gas pattern. Electronically Signed   By: Elon Alas M.D.   On: 02/04/2017 06:18   Dg Duanne Limerick W/water Sol Cm  Result Date: 01/28/2017 CLINICAL DATA:  Peg tube placed via endoscopy on 02/23, pulled out. Replaced yesterday with leakage. 30 French Foley placed today. EXAM: UPPER GI SERIES WITHOUT KUB TECHNIQUE: Routine upper GI series was performed with water-soluble barium. FLUOROSCOPY TIME:  Fluoroscopy Time:  42 seconds COMPARISON:  None. FINDINGS: 60 cc of Isovue-300 was administered via the indwelling PEG tube. Contrast filled the stomach. No extraluminal contrast leakage about the stomach or PEG tube. IMPRESSION: Contrast administration via the indwelling PEG tube demonstrating no extraluminal contrast leakage  or other complicating feature. Electronically Signed   By: Franki Cabot M.D.   On: 01/28/2017 14:10   Dg Duanne Limerick W/water Sol Cm  Result Date: 01/27/2017 CLINICAL DATA:  Evaluate G-tube placement. EXAM: WATER SOLUBLE UPPER GI SERIES CONTRAST:  60 mL of Isovue-300 FLUOROSCOPY TIME:  Fluoroscopy Time:  36 seconds Number of Acquired Spot Images: 0 COMPARISON:  None. FINDINGS: The Foley catheter distal tip terminates in the stomach with an associated  retention balloon. The stomach is normal in location and shape. There is normal emptying into the duodenum. IMPRESSION: The Foley catheter/G tube has been appropriately placed in the stomach. Electronically Signed   By: Dorise Bullion III M.D   On: 01/27/2017 14:34    Subjective: Unable to assess  Discharge Exam: Vitals:   02/11/17 1211 02/11/17 1535  BP:    Pulse: 91 79  Resp: 20 20  Temp:     Vitals:   02/11/17 0345 02/11/17 0817 02/11/17 1211 02/11/17 1535  BP:      Pulse: 96 96 91 79  Resp: 16 20 20 20   Temp:      TempSrc:      SpO2: 92% 93% 95% 95%  Weight:      Height:        General: Pt is awake, not in acute distress Cardiovascular: RRR, S1/S2 +, no rubs, no gallops Respiratory: CTA bilaterally, no wheezing, no rhonchi Abdominal: Soft, NT, ND, bowel sounds + Extremities: no edema, no cyanosis   The results of significant diagnostics from this hospitalization (including imaging, microbiology, ancillary and laboratory) are listed below for reference.     Microbiology: Recent Results (from the past 240 hour(s))  Blood Culture (routine x 2)     Status: None   Collection Time: 02/04/17  5:40 AM  Result Value Ref Range Status   Specimen Description BLOOD RIGHT ARM  Final   Special Requests AEROBIC BOTTLE ONLY 10ML  Final   Culture NO GROWTH 5 DAYS  Final   Report Status 02/09/2017 FINAL  Final  Blood Culture (routine x 2)     Status: None   Collection Time: 02/04/17  5:57 AM  Result Value Ref Range Status   Specimen Description BLOOD RIGHT HAND  Final   Special Requests BOTTLES DRAWN AEROBIC AND ANAEROBIC 5ML  Final   Culture NO GROWTH 5 DAYS  Final   Report Status 02/09/2017 FINAL  Final  Urine culture     Status: None   Collection Time: 02/04/17  7:06 AM  Result Value Ref Range Status   Specimen Description URINE, RANDOM  Final   Special Requests NONE  Final   Culture NO GROWTH  Final   Report Status 02/05/2017 FINAL  Final     Labs: BNP (last 3  results) No results for input(s): BNP in the last 8760 hours. Basic Metabolic Panel:  Recent Labs Lab 02/06/17 0409  NA 144  K 3.6  CL 109  CO2 26  GLUCOSE 192*  BUN 17  CREATININE 0.83  CALCIUM 9.9   Liver Function Tests: No results for input(s): AST, ALT, ALKPHOS, BILITOT, PROT, ALBUMIN in the last 168 hours. No results for input(s): LIPASE, AMYLASE in the last 168 hours. No results for input(s): AMMONIA in the last 168 hours. CBC:  Recent Labs Lab 02/06/17 0409  WBC 6.9  HGB 7.9*  HCT 26.2*  MCV 91.9  PLT 130*   Cardiac Enzymes: No results for input(s): CKTOTAL, CKMB, CKMBINDEX, TROPONINI in the last 168 hours. BNP:  Invalid input(s): POCBNP CBG:  Recent Labs Lab 02/10/17 1729 02/10/17 2243 02/11/17 0801 02/11/17 1207 02/11/17 1556  GLUCAP 171* 225* 228* 154* 168*   D-Dimer No results for input(s): DDIMER in the last 72 hours. Hgb A1c No results for input(s): HGBA1C in the last 72 hours. Lipid Profile No results for input(s): CHOL, HDL, LDLCALC, TRIG, CHOLHDL, LDLDIRECT in the last 72 hours. Thyroid function studies No results for input(s): TSH, T4TOTAL, T3FREE, THYROIDAB in the last 72 hours.  Invalid input(s): FREET3 Anemia work up No results for input(s): VITAMINB12, FOLATE, FERRITIN, TIBC, IRON, RETICCTPCT in the last 72 hours. Urinalysis    Component Value Date/Time   COLORURINE AMBER (A) 02/04/2017 0706   APPEARANCEUR CLEAR 02/04/2017 0706   LABSPEC 1.023 02/04/2017 0706   PHURINE 7.0 02/04/2017 0706   GLUCOSEU 50 (A) 02/04/2017 0706   HGBUR NEGATIVE 02/04/2017 0706   BILIRUBINUR NEGATIVE 02/04/2017 0706   KETONESUR NEGATIVE 02/04/2017 0706   PROTEINUR 30 (A) 02/04/2017 0706   NITRITE NEGATIVE 02/04/2017 0706   LEUKOCYTESUR NEGATIVE 02/04/2017 0706   Sepsis Labs Invalid input(s): PROCALCITONIN,  WBC,  LACTICIDVEN Microbiology Recent Results (from the past 240 hour(s))  Blood Culture (routine x 2)     Status: None   Collection Time:  02/04/17  5:40 AM  Result Value Ref Range Status   Specimen Description BLOOD RIGHT ARM  Final   Special Requests AEROBIC BOTTLE ONLY 10ML  Final   Culture NO GROWTH 5 DAYS  Final   Report Status 02/09/2017 FINAL  Final  Blood Culture (routine x 2)     Status: None   Collection Time: 02/04/17  5:57 AM  Result Value Ref Range Status   Specimen Description BLOOD RIGHT HAND  Final   Special Requests BOTTLES DRAWN AEROBIC AND ANAEROBIC 5ML  Final   Culture NO GROWTH 5 DAYS  Final   Report Status 02/09/2017 FINAL  Final  Urine culture     Status: None   Collection Time: 02/04/17  7:06 AM  Result Value Ref Range Status   Specimen Description URINE, RANDOM  Final   Special Requests NONE  Final   Culture NO GROWTH  Final   Report Status 02/05/2017 FINAL  Final     SIGNED:   Donne Hazel, MD  [facility] 02/11/2017, 6:33 PM  If 7PM-7AM, please contact night-coverage [email or web address] Password [email or web address]

## 2017-02-11 NOTE — Progress Notes (Signed)
April Franklin to be D/C'd  per MD order. Discussed with the family and all questions fully answered.   IV catheter discontinued intact. Site without signs and symptoms of complications. Dressing and pressure applied.  An After Visit Summary was printed and given to the famliy. famliy received prescription.  D/c education completed with family including follow up instructions, medication list, d/c activities limitations if indicated, with other d/c instructions as indicated by MD - patient able to verbalize understanding, all questions fully answered.   Patient instructed to return to ED, call 911, or call MD for any changes in condition.

## 2017-02-11 NOTE — Progress Notes (Signed)
Patient started on tube feeding, per nutrition note start at 32ml/hr and q4h increase rate by 72ml/hr until goal of 62ml/hr. Will increase at approximately 1630 to 25ml/hr if patient tolerates well.

## 2017-02-11 NOTE — Discharge Instructions (Signed)
Tube feed recommendations per dietician: -Initiate Glucerna 1.2 @ 20 ml/hr via PEG and increase by 10 ml every 4 hours to goal rate of 60 ml/hr.   Tube feeding regimen provides 1728 kcal (100% of needs), 86 grams of protein, and 1159 ml of H2O.

## 2017-02-11 NOTE — Progress Notes (Signed)
PT Cancellation Note  Patient Details Name: April Franklin MRN: [id number] DOB: [birth year]   Cancelled Treatment:    Reason Eval/Treat Not Completed: Other (comment)   Discussed pt with RN; she will be dc'ing shortly to home; will defer further PT needs to HHPT;   Roney Marion, [place] Pager [phone] Office [phone]    Colletta [place] 02/11/2017, 1:28 PM

## 2017-02-11 NOTE — Progress Notes (Signed)
Tube feeding rate increased from 30cc/hr to 40cc/hr at 2048 per order.  Residual was less than 100cc's. At [place], pt's daughter stated that she didn't think tube feeding should be decreased, not increased because of patient's liquid stools. I asked the daughter if she wanted me to decrease the rate. She said "yes".  I decreased rate to 30cc/hr per her request.

## 2017-02-12 DIAGNOSIS — I69998 Other sequelae following unspecified cerebrovascular disease: Secondary | ICD-10-CM | POA: Diagnosis not present

## 2017-02-12 DIAGNOSIS — C7889 Secondary malignant neoplasm of other digestive organs: Secondary | ICD-10-CM | POA: Diagnosis not present

## 2017-02-12 DIAGNOSIS — E1165 Type 2 diabetes mellitus with hyperglycemia: Secondary | ICD-10-CM | POA: Diagnosis not present

## 2017-02-12 DIAGNOSIS — J9600 Acute respiratory failure, unspecified whether with hypoxia or hypercapnia: Secondary | ICD-10-CM | POA: Diagnosis not present

## 2017-02-12 DIAGNOSIS — R1311 Dysphagia, oral phase: Secondary | ICD-10-CM | POA: Diagnosis not present

## 2017-02-12 DIAGNOSIS — I69391 Dysphagia following cerebral infarction: Secondary | ICD-10-CM | POA: Diagnosis not present

## 2017-02-14 DIAGNOSIS — J9600 Acute respiratory failure, unspecified whether with hypoxia or hypercapnia: Secondary | ICD-10-CM | POA: Diagnosis not present

## 2017-02-14 DIAGNOSIS — I69391 Dysphagia following cerebral infarction: Secondary | ICD-10-CM | POA: Diagnosis not present

## 2017-02-14 DIAGNOSIS — I69998 Other sequelae following unspecified cerebrovascular disease: Secondary | ICD-10-CM | POA: Diagnosis not present

## 2017-02-14 DIAGNOSIS — E1165 Type 2 diabetes mellitus with hyperglycemia: Secondary | ICD-10-CM | POA: Diagnosis not present

## 2017-02-14 DIAGNOSIS — C7889 Secondary malignant neoplasm of other digestive organs: Secondary | ICD-10-CM | POA: Diagnosis not present

## 2017-02-14 DIAGNOSIS — R1311 Dysphagia, oral phase: Secondary | ICD-10-CM | POA: Diagnosis not present

## 2017-02-15 DIAGNOSIS — Z86718 Personal history of other venous thrombosis and embolism: Secondary | ICD-10-CM | POA: Diagnosis not present

## 2017-02-15 DIAGNOSIS — C787 Secondary malignant neoplasm of liver and intrahepatic bile duct: Secondary | ICD-10-CM | POA: Diagnosis not present

## 2017-02-15 DIAGNOSIS — I10 Essential (primary) hypertension: Secondary | ICD-10-CM | POA: Diagnosis not present

## 2017-02-15 DIAGNOSIS — L89159 Pressure ulcer of sacral region, unspecified stage: Secondary | ICD-10-CM | POA: Diagnosis not present

## 2017-02-15 DIAGNOSIS — E1165 Type 2 diabetes mellitus with hyperglycemia: Secondary | ICD-10-CM | POA: Diagnosis not present

## 2017-02-15 DIAGNOSIS — Z931 Gastrostomy status: Secondary | ICD-10-CM | POA: Diagnosis not present

## 2017-02-15 DIAGNOSIS — I639 Cerebral infarction, unspecified: Secondary | ICD-10-CM | POA: Diagnosis not present

## 2017-02-15 DIAGNOSIS — Z930 Tracheostomy status: Secondary | ICD-10-CM | POA: Diagnosis not present

## 2017-02-17 ENCOUNTER — Emergency Department (HOSPITAL_COMMUNITY): Payer: Medicare Other

## 2017-02-17 ENCOUNTER — Observation Stay (HOSPITAL_COMMUNITY): Admit: 2017-02-17 | Payer: Medicare Other

## 2017-02-17 ENCOUNTER — Encounter (HOSPITAL_COMMUNITY): Payer: Self-pay | Admitting: Emergency Medicine

## 2017-02-17 ENCOUNTER — Inpatient Hospital Stay (HOSPITAL_COMMUNITY)
Admission: EM | Admit: 2017-02-17 | Discharge: 2017-03-05 | DRG: 871 | Disposition: A | Discharge status: Home Health | Payer: Medicare Other | Attending: Internal Medicine | Admitting: Internal Medicine

## 2017-02-17 DIAGNOSIS — J9811 Atelectasis: Secondary | ICD-10-CM | POA: Diagnosis present

## 2017-02-17 DIAGNOSIS — Z794 Long term (current) use of insulin: Secondary | ICD-10-CM

## 2017-02-17 DIAGNOSIS — G9340 Encephalopathy, unspecified: Secondary | ICD-10-CM

## 2017-02-17 DIAGNOSIS — E118 Type 2 diabetes mellitus with unspecified complications: Secondary | ICD-10-CM | POA: Diagnosis present

## 2017-02-17 DIAGNOSIS — I639 Cerebral infarction, unspecified: Secondary | ICD-10-CM | POA: Diagnosis not present

## 2017-02-17 DIAGNOSIS — Z515 Encounter for palliative care: Secondary | ICD-10-CM

## 2017-02-17 DIAGNOSIS — C799 Secondary malignant neoplasm of unspecified site: Secondary | ICD-10-CM

## 2017-02-17 DIAGNOSIS — I82401 Acute embolism and thrombosis of unspecified deep veins of right lower extremity: Secondary | ICD-10-CM | POA: Diagnosis present

## 2017-02-17 DIAGNOSIS — R109 Unspecified abdominal pain: Secondary | ICD-10-CM | POA: Diagnosis not present

## 2017-02-17 DIAGNOSIS — E785 Hyperlipidemia, unspecified: Secondary | ICD-10-CM | POA: Diagnosis present

## 2017-02-17 DIAGNOSIS — A419 Sepsis, unspecified organism: Principal | ICD-10-CM | POA: Diagnosis present

## 2017-02-17 DIAGNOSIS — R140 Abdominal distension (gaseous): Secondary | ICD-10-CM | POA: Diagnosis not present

## 2017-02-17 DIAGNOSIS — R0600 Dyspnea, unspecified: Secondary | ICD-10-CM | POA: Diagnosis not present

## 2017-02-17 DIAGNOSIS — R609 Edema, unspecified: Secondary | ICD-10-CM | POA: Diagnosis not present

## 2017-02-17 DIAGNOSIS — N179 Acute kidney failure, unspecified: Secondary | ICD-10-CM | POA: Diagnosis present

## 2017-02-17 DIAGNOSIS — G9341 Metabolic encephalopathy: Secondary | ICD-10-CM | POA: Diagnosis present

## 2017-02-17 DIAGNOSIS — K560 Paralytic ileus: Secondary | ICD-10-CM | POA: Diagnosis present

## 2017-02-17 DIAGNOSIS — R402441 Other coma, without documented Glasgow coma scale score, or with partial score reported, in the field [EMT or ambulance]: Secondary | ICD-10-CM | POA: Diagnosis not present

## 2017-02-17 DIAGNOSIS — K567 Ileus, unspecified: Secondary | ICD-10-CM

## 2017-02-17 DIAGNOSIS — R0602 Shortness of breath: Secondary | ICD-10-CM

## 2017-02-17 DIAGNOSIS — R627 Adult failure to thrive: Secondary | ICD-10-CM | POA: Diagnosis not present

## 2017-02-17 DIAGNOSIS — Z9911 Dependence on respirator [ventilator] status: Secondary | ICD-10-CM | POA: Diagnosis not present

## 2017-02-17 DIAGNOSIS — I10 Essential (primary) hypertension: Secondary | ICD-10-CM | POA: Diagnosis present

## 2017-02-17 DIAGNOSIS — R0902 Hypoxemia: Secondary | ICD-10-CM

## 2017-02-17 DIAGNOSIS — Z930 Tracheostomy status: Secondary | ICD-10-CM

## 2017-02-17 DIAGNOSIS — Z8673 Personal history of transient ischemic attack (TIA), and cerebral infarction without residual deficits: Secondary | ICD-10-CM

## 2017-02-17 DIAGNOSIS — R6510 Systemic inflammatory response syndrome (SIRS) of non-infectious origin without acute organ dysfunction: Secondary | ICD-10-CM | POA: Diagnosis not present

## 2017-02-17 DIAGNOSIS — E119 Type 2 diabetes mellitus without complications: Secondary | ICD-10-CM | POA: Diagnosis present

## 2017-02-17 DIAGNOSIS — I4891 Unspecified atrial fibrillation: Secondary | ICD-10-CM

## 2017-02-17 DIAGNOSIS — Z86718 Personal history of other venous thrombosis and embolism: Secondary | ICD-10-CM

## 2017-02-17 DIAGNOSIS — I4892 Unspecified atrial flutter: Secondary | ICD-10-CM | POA: Diagnosis present

## 2017-02-17 DIAGNOSIS — D649 Anemia, unspecified: Secondary | ICD-10-CM | POA: Diagnosis present

## 2017-02-17 DIAGNOSIS — I483 Typical atrial flutter: Secondary | ICD-10-CM | POA: Diagnosis not present

## 2017-02-17 DIAGNOSIS — M1990 Unspecified osteoarthritis, unspecified site: Secondary | ICD-10-CM | POA: Diagnosis present

## 2017-02-17 DIAGNOSIS — K9423 Gastrostomy malfunction: Secondary | ICD-10-CM

## 2017-02-17 DIAGNOSIS — J9620 Acute and chronic respiratory failure, unspecified whether with hypoxia or hypercapnia: Secondary | ICD-10-CM | POA: Diagnosis not present

## 2017-02-17 DIAGNOSIS — Y833 Surgical operation with formation of external stoma as the cause of abnormal reaction of the patient, or of later complication, without mention of misadventure at the time of the procedure: Secondary | ICD-10-CM | POA: Diagnosis present

## 2017-02-17 DIAGNOSIS — Z9189 Other specified personal risk factors, not elsewhere classified: Secondary | ICD-10-CM

## 2017-02-17 DIAGNOSIS — I252 Old myocardial infarction: Secondary | ICD-10-CM

## 2017-02-17 DIAGNOSIS — I480 Paroxysmal atrial fibrillation: Secondary | ICD-10-CM

## 2017-02-17 DIAGNOSIS — Z7401 Bed confinement status: Secondary | ICD-10-CM | POA: Diagnosis not present

## 2017-02-17 DIAGNOSIS — Z79899 Other long term (current) drug therapy: Secondary | ICD-10-CM

## 2017-02-17 DIAGNOSIS — C189 Malignant neoplasm of colon, unspecified: Secondary | ICD-10-CM | POA: Diagnosis not present

## 2017-02-17 DIAGNOSIS — C787 Secondary malignant neoplasm of liver and intrahepatic bile duct: Secondary | ICD-10-CM | POA: Diagnosis not present

## 2017-02-17 DIAGNOSIS — Z66 Do not resuscitate: Secondary | ICD-10-CM | POA: Diagnosis not present

## 2017-02-17 DIAGNOSIS — F520 Hypoactive sexual desire disorder: Secondary | ICD-10-CM | POA: Diagnosis not present

## 2017-02-17 DIAGNOSIS — R6520 Severe sepsis without septic shock: Secondary | ICD-10-CM | POA: Diagnosis present

## 2017-02-17 DIAGNOSIS — E1165 Type 2 diabetes mellitus with hyperglycemia: Secondary | ICD-10-CM | POA: Diagnosis not present

## 2017-02-17 DIAGNOSIS — Z452 Encounter for adjustment and management of vascular access device: Secondary | ICD-10-CM | POA: Diagnosis not present

## 2017-02-17 DIAGNOSIS — E875 Hyperkalemia: Secondary | ICD-10-CM | POA: Diagnosis not present

## 2017-02-17 DIAGNOSIS — J9621 Acute and chronic respiratory failure with hypoxia: Secondary | ICD-10-CM | POA: Diagnosis not present

## 2017-02-17 DIAGNOSIS — R509 Fever, unspecified: Secondary | ICD-10-CM | POA: Diagnosis not present

## 2017-02-17 DIAGNOSIS — R6889 Other general symptoms and signs: Secondary | ICD-10-CM

## 2017-02-17 DIAGNOSIS — E8770 Fluid overload, unspecified: Secondary | ICD-10-CM | POA: Diagnosis present

## 2017-02-17 DIAGNOSIS — J690 Pneumonitis due to inhalation of food and vomit: Secondary | ICD-10-CM | POA: Diagnosis not present

## 2017-02-17 DIAGNOSIS — E860 Dehydration: Secondary | ICD-10-CM | POA: Diagnosis present

## 2017-02-17 DIAGNOSIS — J80 Acute respiratory distress syndrome: Secondary | ICD-10-CM | POA: Diagnosis not present

## 2017-02-17 DIAGNOSIS — L89150 Pressure ulcer of sacral region, unstageable: Secondary | ICD-10-CM

## 2017-02-17 HISTORY — DX: Systemic inflammatory response syndrome (sirs) of non-infectious origin without acute organ dysfunction: R65.10

## 2017-02-17 HISTORY — DX: Type 2 diabetes mellitus without complications: E11.9

## 2017-02-17 HISTORY — DX: Acute embolism and thrombosis of unspecified deep veins of unspecified lower extremity: I82.409

## 2017-02-17 HISTORY — DX: Cerebral infarction, unspecified: I63.9

## 2017-02-17 LAB — PROTIME-INR
INR: 1.39
Prothrombin Time: 17.2 seconds — ABNORMAL HIGH (ref 11.4–15.2)

## 2017-02-17 LAB — URINALYSIS, ROUTINE W REFLEX MICROSCOPIC
Bacteria, UA: NONE SEEN
Glucose, UA: 50 mg/dL — AB
Ketones, ur: NEGATIVE mg/dL
Leukocytes, UA: NEGATIVE
Nitrite: NEGATIVE
Protein, ur: 30 mg/dL — AB
SPECIFIC GRAVITY, URINE: 1.028 (ref 1.005–1.030)
pH: 5 (ref 5.0–8.0)

## 2017-02-17 LAB — COMPREHENSIVE METABOLIC PANEL
ALT: 65 U/L — ABNORMAL HIGH (ref 14–54)
AST: 89 U/L — ABNORMAL HIGH (ref 15–41)
Albumin: 2 g/dL — ABNORMAL LOW (ref 3.5–5.0)
Alkaline Phosphatase: 533 U/L — ABNORMAL HIGH (ref 38–126)
Anion gap: 15 (ref 5–15)
BUN: 24 mg/dL — ABNORMAL HIGH (ref 6–20)
CO2: 24 mmol/L (ref 22–32)
Calcium: 9.9 mg/dL (ref 8.9–10.3)
Chloride: 97 mmol/L — ABNORMAL LOW (ref 101–111)
Creatinine, Ser: 1.24 mg/dL — ABNORMAL HIGH (ref 0.44–1.00)
GFR calc Af Amer: 48 mL/min — ABNORMAL LOW (ref >60)
GFR calc non Af Amer: 42 mL/min — ABNORMAL LOW (ref >60)
Glucose, Bld: 155 mg/dL — ABNORMAL HIGH (ref 65–99)
Potassium: 4.9 mmol/L (ref 3.5–5.1)
Sodium: 136 mmol/L (ref 135–145)
Total Bilirubin: 1.9 mg/dL — ABNORMAL HIGH (ref 0.3–1.2)
Total Protein: 6.5 g/dL (ref 6.5–8.1)

## 2017-02-17 LAB — CBC WITH DIFFERENTIAL/PLATELET
Basophils Absolute: 0 10*3/uL (ref 0.0–0.1)
Basophils Relative: 0 %
Eosinophils Absolute: 0 10*3/uL (ref 0.0–0.7)
Eosinophils Relative: 0 %
HCT: 33 % — ABNORMAL LOW (ref 36.0–46.0)
Hemoglobin: 10.3 g/dL — ABNORMAL LOW (ref 12.0–15.0)
Lymphocytes Relative: 4 %
Lymphs Abs: 1.3 10*3/uL (ref 0.7–4.0)
MCH: 28 pg (ref 26.0–34.0)
MCHC: 31.2 g/dL (ref 30.0–36.0)
MCV: 89.7 fL (ref 78.0–100.0)
Monocytes Absolute: 2.3 10*3/uL — ABNORMAL HIGH (ref 0.1–1.0)
Monocytes Relative: 7 %
Neutro Abs: 29.9 10*3/uL — ABNORMAL HIGH (ref 1.7–7.7)
Neutrophils Relative %: 89 %
Platelets: 141 10*3/uL — ABNORMAL LOW (ref 150–400)
RBC: 3.68 MIL/uL — ABNORMAL LOW (ref 3.87–5.11)
RDW: 18.4 % — ABNORMAL HIGH (ref 11.5–15.5)
WBC: 33.5 10*3/uL — ABNORMAL HIGH (ref 4.0–10.5)

## 2017-02-17 LAB — APTT: aPTT: 34 seconds (ref 24–36)

## 2017-02-17 LAB — I-STAT CG4 LACTIC ACID, ED: Lactic Acid, Venous: 4.59 mmol/L (ref 0.5–1.9)

## 2017-02-17 LAB — I-STAT ARTERIAL BLOOD GAS, ED
Bicarbonate: 21.7 mmol/L (ref 20.0–28.0)
O2 Saturation: 98 %
Patient temperature: 98.6
TCO2: 22 mmol/L (ref 0–100)
pCO2 arterial: 26.7 mmHg — ABNORMAL LOW (ref 32.0–48.0)
pH, Arterial: 7.518 — ABNORMAL HIGH (ref 7.350–7.450)
pO2, Arterial: 87 mmHg (ref 83.0–108.0)

## 2017-02-17 LAB — LACTIC ACID, PLASMA: LACTIC ACID, VENOUS: 3.5 mmol/L — AB (ref 0.5–1.9)

## 2017-02-17 LAB — PROCALCITONIN: Procalcitonin: 17.8 ng/mL

## 2017-02-17 LAB — CREATININE, SERUM
CREATININE: 1.39 mg/dL — AB (ref 0.44–1.00)
GFR, EST AFRICAN AMERICAN: 42 mL/min — AB (ref >60)
GFR, EST NON AFRICAN AMERICAN: 36 mL/min — AB (ref >60)

## 2017-02-17 LAB — CK: CK TOTAL: 43 U/L (ref 38–234)

## 2017-02-17 LAB — GLUCOSE, CAPILLARY
GLUCOSE-CAPILLARY: 137 mg/dL — AB (ref 65–99)
Glucose-Capillary: 118 mg/dL — ABNORMAL HIGH (ref 65–99)

## 2017-02-17 MED ORDER — INSULIN ASPART 100 UNIT/ML ~~LOC~~ SOLN: 0.0000 [IU] | Freq: Every day | SUBCUTANEOUS | Status: DC

## 2017-02-17 MED ORDER — MAGNESIUM HYDROXIDE 400 MG/5ML PO SUSP
30.0000 mL | Freq: Every day | ORAL | Status: DC | PRN
Start: 2017-02-17 — End: 2017-02-20

## 2017-02-17 MED ORDER — MORPHINE SULFATE (PF) 4 MG/ML IV SOLN: 1.0000 mg | INTRAVENOUS | Status: DC | PRN

## 2017-02-17 MED ORDER — ALBUTEROL SULFATE (2.5 MG/3ML) 0.083% IN NEBU
2.5000 mg | INHALATION_SOLUTION | Freq: Four times a day (QID) | RESPIRATORY_TRACT | Status: DC
  Administered 2017-02-17 – 2017-02-18 (×4): 2.5 mg via RESPIRATORY_TRACT
  Filled 2017-02-17 (×4): qty 3

## 2017-02-17 MED ORDER — DOCUSATE SODIUM 50 MG/5ML PO LIQD
50.0000 mg | Freq: Every day | ORAL | Status: DC
  Administered 2017-02-17 – 2017-02-18 (×2): 50 mg
  Filled 2017-02-17 (×2): qty 10

## 2017-02-17 MED ORDER — VANCOMYCIN HCL IN DEXTROSE 1-5 GM/200ML-% IV SOLN
1000.0000 mg | Freq: Once | INTRAVENOUS | Status: DC
  Filled 2017-02-17: qty 200

## 2017-02-17 MED ORDER — HEPARIN SODIUM (PORCINE) 5000 UNIT/ML IJ SOLN
5000.0000 [IU] | Freq: Three times a day (TID) | INTRAMUSCULAR | Status: DC
  Administered 2017-02-17 – 2017-02-18 (×3): 5000 [IU] via SUBCUTANEOUS
  Filled 2017-02-17 (×3): qty 1

## 2017-02-17 MED ORDER — LIDOCAINE HCL (PF) 1 % IJ SOLN
INTRAMUSCULAR | Status: AC
  Filled 2017-02-17: qty 10

## 2017-02-17 MED ORDER — SCOPOLAMINE 1 MG/3DAYS TD PT72
1.0000 | MEDICATED_PATCH | TRANSDERMAL | Status: DC
  Administered 2017-02-17: 1.5 mg via TRANSDERMAL
  Filled 2017-02-17: qty 1

## 2017-02-17 MED ORDER — PIPERACILLIN-TAZOBACTAM 3.375 G IVPB 30 MIN
3.3750 g | Freq: Once | INTRAVENOUS | Status: AC
  Administered 2017-02-17: 3.375 g via INTRAVENOUS
  Filled 2017-02-17: qty 50

## 2017-02-17 MED ORDER — VANCOMYCIN HCL IN DEXTROSE 1-5 GM/200ML-% IV SOLN
1000.0000 mg | INTRAVENOUS | Status: AC
  Administered 2017-02-18 – 2017-02-19 (×2): 1000 mg via INTRAVENOUS
  Filled 2017-02-17 (×2): qty 200

## 2017-02-17 MED ORDER — ACETAMINOPHEN 650 MG RE SUPP
650.0000 mg | Freq: Four times a day (QID) | RECTAL | Status: DC | PRN
  Administered 2017-02-17: 650 mg via RECTAL
  Filled 2017-02-17: qty 1

## 2017-02-17 MED ORDER — ACETAMINOPHEN 325 MG PO TABS: 650.0000 mg | ORAL_TABLET | Freq: Once | ORAL | Status: DC

## 2017-02-17 MED ORDER — SODIUM CHLORIDE 0.9% FLUSH
3.0000 mL | Freq: Two times a day (BID) | INTRAVENOUS | Status: DC
  Administered 2017-02-17 – 2017-03-05 (×25): 3 mL via INTRAVENOUS

## 2017-02-17 MED ORDER — INSULIN ASPART 100 UNIT/ML ~~LOC~~ SOLN
0.0000 [IU] | Freq: Three times a day (TID) | SUBCUTANEOUS | Status: DC
  Administered 2017-02-17 – 2017-02-18 (×3): 1 [IU] via SUBCUTANEOUS
  Administered 2017-02-19: 2 [IU] via SUBCUTANEOUS
  Administered 2017-02-19: 1 [IU] via SUBCUTANEOUS
  Administered 2017-02-20 (×2): 2 [IU] via SUBCUTANEOUS

## 2017-02-17 MED ORDER — METOPROLOL TARTRATE 12.5 MG HALF TABLET
12.5000 mg | ORAL_TABLET | Freq: Two times a day (BID) | ORAL | Status: DC
  Administered 2017-02-17 – 2017-02-18 (×2): 12.5 mg
  Filled 2017-02-17 (×3): qty 1

## 2017-02-17 MED ORDER — VANCOMYCIN HCL 10 G IV SOLR
1250.0000 mg | Freq: Once | INTRAVENOUS | Status: AC
  Administered 2017-02-17: 1250 mg via INTRAVENOUS
  Filled 2017-02-17 (×2): qty 1250

## 2017-02-17 MED ORDER — ONDANSETRON HCL 4 MG PO TABS: 4.0000 mg | ORAL_TABLET | Freq: Four times a day (QID) | ORAL | Status: DC | PRN

## 2017-02-17 MED ORDER — METOPROLOL TARTRATE 25 MG PO TABS: 12.5000 mg | ORAL_TABLET | Freq: Two times a day (BID) | ORAL | Status: DC

## 2017-02-17 MED ORDER — SODIUM CHLORIDE 0.9 % IV BOLUS (SEPSIS)
1000.0000 mL | Freq: Once | INTRAVENOUS | Status: AC
Start: 2017-02-17 — End: 2017-02-17
  Administered 2017-02-17: 1000 mL via INTRAVENOUS

## 2017-02-17 MED ORDER — PIPERACILLIN-TAZOBACTAM 3.375 G IVPB 30 MIN: 3.3750 g | Freq: Once | INTRAVENOUS | Status: DC

## 2017-02-17 MED ORDER — PIPERACILLIN-TAZOBACTAM 3.375 G IVPB
3.3750 g | Freq: Three times a day (TID) | INTRAVENOUS | Status: DC
Start: 2017-02-17 — End: 2017-02-21
  Administered 2017-02-17 – 2017-02-21 (×11): 3.375 g via INTRAVENOUS
  Filled 2017-02-17 (×13): qty 50

## 2017-02-17 MED ORDER — FREE WATER
240.0000 mL | Freq: Four times a day (QID) | Status: DC
  Administered 2017-02-17 – 2017-02-18 (×4): 240 mL

## 2017-02-17 MED ORDER — ACETAMINOPHEN 325 MG PO TABS
650.0000 mg | ORAL_TABLET | Freq: Four times a day (QID) | ORAL | Status: DC | PRN
  Filled 2017-02-17: qty 2

## 2017-02-17 MED ORDER — SODIUM CHLORIDE 0.9 % IV SOLN
INTRAVENOUS | Status: AC
  Administered 2017-02-17: 16:00:00 via INTRAVENOUS

## 2017-02-17 MED ORDER — PANTOPRAZOLE SODIUM 40 MG PO PACK
40.0000 mg | PACK | Freq: Every day | ORAL | Status: DC
  Administered 2017-02-18: 40 mg
  Filled 2017-02-17: qty 20

## 2017-02-17 MED ORDER — VANCOMYCIN HCL IN DEXTROSE 1-5 GM/200ML-% IV SOLN: 1000.0000 mg | Freq: Once | INTRAVENOUS | Status: DC

## 2017-02-17 MED ORDER — SODIUM CHLORIDE 0.9 % IV BOLUS (SEPSIS)
1000.0000 mL | Freq: Once | INTRAVENOUS | Status: AC
  Administered 2017-02-17: 1000 mL via INTRAVENOUS

## 2017-02-17 MED ORDER — LOSARTAN POTASSIUM 25 MG PO TABS: 25.0000 mg | ORAL_TABLET | Freq: Every day | ORAL | Status: DC

## 2017-02-17 MED ORDER — ONDANSETRON HCL 4 MG/2ML IJ SOLN: 4.0000 mg | Freq: Four times a day (QID) | INTRAMUSCULAR | Status: DC | PRN

## 2017-02-17 NOTE — Progress Notes (Signed)
Pharmacy Antibiotic Note  April Franklin is a 76 y.o. female admitted on 02/17/2017 with sepsis.  Pharmacy has been consulted for Zosyn and vancomycin dosing.  Febrile up to 102.2. One time doses of Zosyn and vancomycin ordered in the ED. SCr elevated at 1.24, CrCl ~70ml/min  Plan: Start Zosyn 3.375 gm IV q8h (4 hour infusion) Give vancomycin 1,250mg  IV x 1, then start vancomycin 1g IV Q24h Monitor clinical picture, renal function, VT at Css F/U C&S, abx deescalation / LOT  Height: 5\' 2"  (157.5 cm) Weight: 143 lb (64.9 kg) IBW/kg (Calculated) : 50.1  No data recorded.  No results for input(s): WBC, CREATININE, LATICACIDVEN, VANCOTROUGH, VANCOPEAK, VANCORANDOM, GENTTROUGH, GENTPEAK, GENTRANDOM, TOBRATROUGH, TOBRAPEAK, TOBRARND, AMIKACINPEAK, AMIKACINTROU, AMIKACIN in the last 168 hours.  Estimated Creatinine Clearance: 52.6 mL/min (by C-G formula based on SCr of 0.83 mg/dL).    No Known Allergies  Antimicrobials this admission: Zosyn 3/26 >>  Vancomycin 3/26 >>   Dose adjustments this admission: n/a  Microbiology results: 3/26 BCx: sent  Thank you for allowing pharmacy to be a part of this patient's care.  Reginia Naas 02/17/2017 9:54 AM

## 2017-02-17 NOTE — ED Triage Notes (Addendum)
Pt in from home via [facility] with reports of fever, vomitting and decreased LOC since Friday. Pt has hx of trach, PEG tube (with increased brown output "smelling like feces" since Friday). GCS of 9 on ED arrival. RR 45, sats 100% on 28% collar at 6L, lungs CTA. Rectal temp 102.2, HR 120, BP 101/58, given 285ml's NS PTA

## 2017-02-17 NOTE — H&P (Signed)
History and Physical    April Franklin DQQ:[id number] DOB: [birth year] DOA: 02/17/2017  PCP: [name], MD Patient coming from: home  Chief Complaint: fever/nausea/vomiting/ams/leaking g tube  HPI: April Franklin is a 76 y.o. 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 Initial evaluation concerning for sepsis and reveals acute kidney injury.  Information is obtained from the daughter who is at the bedside. She states patient was discharged to home approximately a week ago and did "well" until 3 days ago when she developed intermittent fever. Max temp noted at being 102.2. Associated symptoms include emesis. Denies any coffee ground emesis. In addition they reported leaking around the G-tube is smells like "cc". No reports of increased secretions. No reports of diarrhea. They did report not receiving Protonix until yesterday. The medication was ordered at discharge. Daughter states it was "shortly after that that the G-tube started leaking".   ED Course: Emergency department she has a temperature 102.2 rectally she is tachycardic tachypnea. She is provided with vigorous IV fluids vancomycin and zosyn  Review of Systems: As per HPI otherwise 10 point review of systems negative. Unable to perform as patient is nonverbal  Ambulatory Status: Nonambulatory  Past Medical History:  Diagnosis Date  . Arthritis   . Colon cancer ([place])   . Diabetes mellitus   . Hyperlipidemia   . Hypertension   . UTI (urinary tract infection)     Past Surgical History:  Procedure Laterality Date  . ABDOMINAL HYSTERECTOMY    .  APPENDECTOMY    . CATARACT EXTRACTION    . COLON RESECTION    . COLONOSCOPY    . ESOPHAGOGASTRODUODENOSCOPY N/A 01/16/2017   Procedure: ESOPHAGOGASTRODUODENOSCOPY (EGD);  Surgeon: Georganna Skeans, MD;  Location: [facility];  Service: General;  Laterality: N/A;  bedside  . FLEXIBLE SIGMOIDOSCOPY N/A 10/10/2015   Procedure: FLEXIBLE SIGMOIDOSCOPY;  Surgeon: Garlan Fair, MD;  Location: [facility];  Service: Endoscopy;  Laterality: N/A;  UNSEDATED  . IR GENERIC HISTORICAL  01/10/2017   IR PERCUTANEOUS ART THROMBECTOMY/INFUSION INTRACRANIAL INC DIAG ANGIO 01/10/2017 Luanne Bras, MD [facility]  . IR GENERIC HISTORICAL  01/10/2017   IR ANGIO VERTEBRAL SEL SUBCLAVIAN INNOMINATE UNI L MOD SED 01/10/2017 Luanne Bras, MD [facility]  . LEG SURGERY    . PEG PLACEMENT N/A 01/16/2017   Procedure: PERCUTANEOUS ENDOSCOPIC GASTROSTOMY (PEG) PLACEMENT;  Surgeon: Georganna Skeans, MD;  Location: [facility];  Service: General;  Laterality: N/A;  . RADIOLOGY WITH ANESTHESIA N/A 01/10/2017   Procedure: RADIOLOGY WITH ANESTHESIA;  Surgeon: Medication Radiologist, MD;  Location: [facility];  Service: Radiology;  Laterality: N/A;    Social History   Social History  . Marital status: Married    Spouse name: N/A  . Number of children: N/A  . Years of education: N/A   Occupational History  . Not on file.   Social History Main Topics  . Smoking status: Never Smoker  . Smokeless tobacco: Never Used  . Alcohol use No  . Drug use: No  . Sexual activity: No   Other Topics Concern  . Not on file   Social History Narrative  . No narrative on file   Patient lives at home with her husband her family provides care.  No Known Allergies  Family History  Problem Relation Age of Onset  . Diabetes Mother   . CAD Father     Prior to Admission medications   Medication Sig Start Date End Date Taking? Authorizing Provider  chlorhexidine (PERIDEX) 0.12 % solution 15 mLs by Mouth Rinse route 2 (two)  times daily. 02/11/17  Yes Donne Hazel, MD  docusate (COLACE) 50 MG/5ML liquid Place 5 mLs (50 mg total) into feeding tube at bedtime. 01/31/17  Yes David L Rinehuls, PA-C  guaiFENesin (MUCINEX) 600 MG 12 hr tablet Take 2 tablets (1,200 mg total) by mouth 2 (two) times daily. 02/11/17  Yes Donne Hazel, MD  insulin aspart (NOVOLOG) 100 UNIT/ML FlexPen Inject 8 Units into the skin every 4 (four) hours. 02/11/17  Yes Donne Hazel, MD  insulin degludec (TRESIBA) 100 UNIT/ML SOPN FlexTouch Pen Inject 0.2 mLs (20 Units total) into the skin daily at 10 pm. PCP to assist with prior authorization if needed 02/11/17  Yes Donne Hazel, MD  Insulin Pen Needle (PEN NEEDLES) 32G X 5 MM MISC 1 Device by Does not apply route See admin instructions. For use with insulin pens. Additional refills per PCP 02/11/17  Yes Donne Hazel, MD  losartan (COZAAR) 25 MG tablet Place 1 tablet (25 mg total) into feeding tube daily. 02/11/17  Yes Donne Hazel, MD  magnesium hydroxide (MILK OF MAGNESIA) 400 MG/5ML suspension Place 30 mLs into feeding tube daily as needed for mild constipation. 01/31/17  Yes David L Rinehuls, PA-C  metoprolol tartrate (LOPRESSOR) 25 MG tablet Place 0.5 tablets (12.5 mg total) into feeding tube 2 (two) times daily. 02/11/17  Yes Donne Hazel, MD  pantoprazole sodium (PROTONIX) 40 mg/20 mL PACK Place 20 mLs (40 mg total) into feeding tube daily. 02/11/17  Yes Donne Hazel, MD  Water For Irrigation, Sterile (FREE WATER) SOLN Place 240 mLs into feeding tube every 6 (six) hours. 01/31/17  Yes Dwaine Gale, PA-C    Physical Exam: Vitals:   02/17/17 1200 02/17/17 1230 02/17/17 1245 02/17/17 1300  BP: 103/60 112/78 118/69 (!) 126/55  Pulse:  (!) 49    Resp: (!) 48 (!) 39 (!) 43 (!) 49  Temp:      TempSrc:      SpO2:  98%    Weight:      Height:         General:  Appears Quite lethargic and nonresponsive chronically ill appearing Eyes:  PERRL, EOMI, normal lids, iris not tracking ENT:   grossly normal hearing, lips & tongue, slightly pale moist Neck:  no LAD, masses or thyromegaly Cardiovascular:  RRR, no m/r/g. 1+ lower extremity edema on right trace lower extremity edema on left bilateral feet cool and slightly cyanotic. Pedal pulses present   Respiratory:  Tachypnea. Trach intact with moderate amount of clear secretions. Breath sounds coarse throughout hear rhonchi throughout no crackles no wheeze Abdomen:  soft, positive bowel sounds g tube intact insertion site clean and dry Skin:  no rash or induration seen on limited exam Musculoskeletal:  Poor muscle tone. Right knee with swelling. No heat no erythema other joints without erythema  Psychiatric:   Neurological  Does not open eyes to painful stimuli. Does not follow commands.   Labs on Admission: I have personally reviewed following labs and imaging studies  CBC:  Recent Labs Lab 02/17/17 1012  WBC 33.5*  NEUTROABS 29.9*  HGB 10.3*  HCT 33.0*  MCV 89.7  PLT  024*   Basic Metabolic Panel:  Recent Labs Lab 02/17/17 1012  NA 136  K 4.9  CL 97*  CO2 24  GLUCOSE 155*  BUN 24*  CREATININE 1.24*  CALCIUM 9.9   GFR: Estimated Creatinine Clearance: 35.2 mL/min (A) (by C-G formula based on SCr of 1.24 mg/dL (H)). Liver Function Tests:  Recent Labs Lab 02/17/17 1012  AST 89*  ALT 65*  ALKPHOS 533*  BILITOT 1.9*  PROT 6.5  ALBUMIN 2.0*   No results for input(s): LIPASE, AMYLASE in the last 168 hours. No results for input(s): AMMONIA in the last 168 hours. Coagulation Profile: No results for input(s): INR, PROTIME in the last 168 hours. Cardiac Enzymes: No results for input(s): CKTOTAL, CKMB, CKMBINDEX, TROPONINI in the last 168 hours. BNP (last 3 results) No results for input(s): PROBNP in the last 8760 hours. HbA1C: No results for input(s): HGBA1C in the last 72 hours. CBG:  Recent Labs Lab 02/10/17 1729 02/10/17 2243 02/11/17 0801 02/11/17 1207 02/11/17 1556  GLUCAP 171* 225* 228*  154* 168*   Lipid Profile: No results for input(s): CHOL, HDL, LDLCALC, TRIG, CHOLHDL, LDLDIRECT in the last 72 hours. Thyroid Function Tests: No results for input(s): TSH, T4TOTAL, FREET4, T3FREE, THYROIDAB in the last 72 hours. Anemia Panel: No results for input(s): VITAMINB12, FOLATE, FERRITIN, TIBC, IRON, RETICCTPCT in the last 72 hours. Urine analysis:    Component Value Date/Time   COLORURINE AMBER (A) 02/04/2017 0706   APPEARANCEUR CLEAR 02/04/2017 0706   LABSPEC 1.023 02/04/2017 0706   PHURINE 7.0 02/04/2017 0706   GLUCOSEU 50 (A) 02/04/2017 0706   HGBUR NEGATIVE 02/04/2017 0706   BILIRUBINUR NEGATIVE 02/04/2017 0706   KETONESUR NEGATIVE 02/04/2017 0706   PROTEINUR 30 (A) 02/04/2017 0706   NITRITE NEGATIVE 02/04/2017 0706   LEUKOCYTESUR NEGATIVE 02/04/2017 0706    Creatinine Clearance: Estimated Creatinine Clearance: 35.2 mL/min (A) (by C-G formula based on SCr of 1.24 mg/dL (H)).  Sepsis Labs: @LABRCNTIP (procalcitonin:4,lacticidven:4) ) Recent Results (from the past 240 hour(s))  Body fluid culture     Status: None (Preliminary result)   Collection Time: 02/17/17 12:48 PM  Result Value Ref Range Status   Specimen Description SYNOVIAL RIGHT KNEE  Final   Special Requests NONE  Final   Gram Stain   Final    MODERATE WBC PRESENT, PREDOMINANTLY MONONUCLEAR RARE WBC PRESENT, PREDOMINANTLY PMN NO ORGANISMS SEEN    Culture PENDING  Incomplete   Report Status PENDING  Incomplete     Radiological Exams on Admission: Dg Chest Portable 1 View  Result Date: 02/17/2017 CLINICAL DATA:  Fever and tachypnea. EXAM: PORTABLE CHEST 1 VIEW COMPARISON:  02/04/2017 FINDINGS: Tracheostomy tube tip is above the carina. The heart size is mildly enlarged. There is mild interstitial edema. No airspace opacities. IMPRESSION: 1. Cardiac enlargement and mild edema. Electronically Signed   By: Kerby Moors M.D.   On: 02/17/2017 10:53    EKG: Independently reviewed. Sinus tachycardia  Multiform ventricular premature complexes Inferior infarct, age indeterminate Tall R wave in V2, consider RVH or PMI Consider anterolateral infarct  Assessment/Plan Principal Problem:   SIRS (systemic inflammatory response syndrome) (HCC) Active Problems:   Hypertension   At risk for hyperglycemia   Colon cancer (HCC)   Diabetes mellitus with complication (HCC)   Acute on chronic respiratory failure ([place]) s/p Feinstein Tracheostomy 01/16/17   Tracheostomy status ([place])   Encephalopathy   Sepsis ([place])   Acute kidney injury ([place])   1.SIRS/Sepsis. Source unknown. Patient is febrile with tachycardia  and tachypnea, leukocytosis, elevated lactic acid and acute kidney injury, ams normal EKG. She was provided with fluid resuscitation and broad antibiotics and the emergency department. Chest x-ray with cardiac enlargement and mild edema. Urinalysis pending. Discussed goals of care with family who confirm DNR. -Admit to telemetry -Continue IV fluids -Continue broad-spectrum antibiotics -Follow blood cultures -Follow urinalysis -Obtain a CK level -Follow lactic acid -Monitor urine output -Palliative care consult  #2. Acute kidney injury. Likely related to dehydration secondary to decreased G-tube intake and emesis. Creatinine 1.24 on admission. Chart review indicates creatinine within the limits of normal last hospitalization -Continue IV fluids -Hold nephrotoxins -Monitor urine output -Follow urinalysis  #3. Acute on chronic respiratory failure. Status post tracheostomy 2 hospitalizations ago. At that time he was also treated for aspiration pneumonia. Patient with tachypnea but no hypoxia. Chest x-ray as noted above. ABG with pH of 7.51, PCO2 26.7, PO2 87 bicarb 21.7. -See #1 -anti-tussive -scopolamine -oxygen supplementation as indicated -Antibiotics as noted above  #4. Acute on chronic encephalopathy. Likely related to above in setting of recent stroke. Recent CT showed progressing of  acute infarct on the left. Dilated last hospitalization neurology. Chart review/notes indicate opined very poor prognosis. Discharge summary dated 02/11/2017 indicates on the day of discharge patient's eyes were open and she was looking around the room. Patient non-responsive at time of admission -recommend comfort care -palliative care consult -Aspirin stopped last hospitalization  #5. Hypertension. Only fair control emergency department. Home meds include losartan and beta blocker. -Hold losartan for now secondary to #2 -Continue beta blocker with parameters  #6. Diabetes. Serum glucose 155 on admission. Recent hemoglobin A1c is 7.2. Home medications include Lantus -Hold Lantus for now -Sliding scale insulin for optimal control  #7. Lower extremity edema. DVT 2 hospitalizations ago. Anticoagulation stopped last hospitalization due to anemia. Synovial fluid aspirated in ED -follow fluid studies -follow dopplar    DVT prophylaxis: lovenox  Code Status: dnr  Family Communication: daughters at bedside  Disposition Plan: home would benefit snf  Consults called: palliative care  Admission status: obs    Radene Gunning MD [facility]  If 7PM-7AM, please contact night-coverage [email or web address] Password [email or web address]  02/17/2017, 2:06 PM

## 2017-02-17 NOTE — Progress Notes (Signed)
CRITICAL VALUE ALERT  Critical value received:  Lactic 3.5  Date of notification:  02/17/17  Time of notification: 17:08  Critical value read back:  yes   Nurse who received alert:  Alex Gardener  MD notified (1st page):  Dr. Marily Memos  Time of first page:  17:08  MD notified (2nd page):  Time of second page:  Responding MD:  Dyanne Carrel, NP  Time MD responded: 17:14

## 2017-02-17 NOTE — ED Notes (Signed)
This RN called Pharmacy, since IV Vanc not yet received. Per Pharmacist, dose will be tubed shortly.

## 2017-02-17 NOTE — ED Notes (Signed)
Attempted to call report x 1  

## 2017-02-17 NOTE — ED Provider Notes (Signed)
[facility] DEPT Provider Note   CSN: [id number] Arrival date & time: 02/17/17  2993  By signing my name below, I, Sonum Patel, attest that this documentation has been prepared under the direction and in the presence of Virgel Manifold, MD. Electronically Signed: Ludger Nutting, Scribe. 02/17/17. 9:44 AM.  History   Chief Complaint Chief Complaint  Patient presents with  . Code Sepsis    The history is provided by a relative and a caregiver. The history is limited by the condition of the patient. No language interpreter was used.    LEVEL 5 CAVEAT: Patient is nonverbal  HPI Comments: April Franklin is a 76 y.o. female brought in by ambulance, who presents to the Emergency Department with intermittent fever and vomiting for the past 3 days. Family states patient has had a few episodes of vomiting daily. She also had leakage from her feeding tube, states today it had a light odor but last week it smelled like feces. She has had a cough for which she is taking Mucinex. Family denies increased secretions from her tracheostomy site. 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 Family states she is currently taking anti-coagulants and started Protonix yesterday.   Past Medical History:  Diagnosis Date  . Arthritis   . Colon cancer ([place])   . Diabetes mellitus   . Hyperlipidemia   . Hypertension   . UTI (urinary tract infection)     Patient Active Problem List   Diagnosis Date Noted  . S/P percutaneous endoscopic gastrostomy (PEG) tube placement ([place])   . Acute deep vein thrombosis (DVT) of popliteal vein of right lower extremity (HCC)   . Palliative care encounter   . SIRS (systemic inflammatory response syndrome) ([place]) 02/04/2017  . Encephalopathy 02/04/2017  . Stroke ([place]) 02/04/2017  . Acute CVA (cerebrovascular accident) ([place])   . NSTEMI (non-ST elevated myocardial infarction) ([place])   . Sepsis ([place])   . Pressure injury  of skin 01/24/2017  . Aspiration pneumonia ([place])   . Dizziness   . Fever   . Tracheostomy status ([place])   . Ventilator dependent ([place])   . Anemia of critical lillness 01/17/2017  . Hypomagnesemia 01/17/2017  . Cerebrovascular accident (CVA) due to thrombosis of precerebral artery ([place])   . 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 Cerebral embolism with cerebral infarction 01/10/2017  . Acute encephalopathy with coma 01/09/2017  . Elevated troponin 01/09/2017  . Diabetes mellitus with complication ([place]) 71/69/6789  . Hypercalcemia 01/09/2017  . Nonintractable headache   . Metastatic adenocarcinoma to liver ([place]) 12/30/2016  . Hypertension 02/21/2012  . At risk for hyperglycemia 02/21/2012  . Colon cancer ([place]) 02/21/2012    Past Surgical History:  Procedure Laterality Date  . ABDOMINAL HYSTERECTOMY    . APPENDECTOMY    . CATARACT EXTRACTION    . COLON RESECTION    . COLONOSCOPY    . ESOPHAGOGASTRODUODENOSCOPY N/A 01/16/2017   Procedure: ESOPHAGOGASTRODUODENOSCOPY (EGD);  Surgeon: Georganna Skeans, MD;  Location: [facility];  Service: General;  Laterality: N/A;  bedside  . FLEXIBLE SIGMOIDOSCOPY N/A 10/10/2015   Procedure: FLEXIBLE SIGMOIDOSCOPY;  Surgeon: Garlan Fair, MD;  Location: [facility];  Service: Endoscopy;  Laterality: N/A;  UNSEDATED  . IR GENERIC HISTORICAL  01/10/2017   IR PERCUTANEOUS ART THROMBECTOMY/INFUSION INTRACRANIAL INC DIAG ANGIO 01/10/2017 Luanne Bras, MD [facility]  . IR GENERIC HISTORICAL  01/10/2017   IR  ANGIO VERTEBRAL SEL SUBCLAVIAN INNOMINATE UNI L MOD SED 01/10/2017 Luanne Bras, MD [facility]  . LEG SURGERY    . PEG PLACEMENT N/A 01/16/2017   Procedure: PERCUTANEOUS ENDOSCOPIC GASTROSTOMY (PEG) PLACEMENT;  Surgeon: Georganna Skeans, MD;  Location: [facility];  Service: General;  Laterality: N/A;  . RADIOLOGY WITH ANESTHESIA N/A 01/10/2017   Procedure: RADIOLOGY WITH ANESTHESIA;  Surgeon:  Medication Radiologist, MD;  Location: [facility];  Service: Radiology;  Laterality: N/A;    OB History    No data available       Home Medications    Prior to Admission medications   Medication Sig Start Date End Date Taking? Authorizing Provider  chlorhexidine (PERIDEX) 0.12 % solution 15 mLs by Mouth Rinse route 2 (two) times daily. 02/11/17   Donne Hazel, MD  docusate (COLACE) 50 MG/5ML liquid Place 5 mLs (50 mg total) into feeding tube at bedtime. 01/31/17   David L Rinehuls, PA-C  guaiFENesin (MUCINEX) 600 MG 12 hr tablet Take 2 tablets (1,200 mg total) by mouth 2 (two) times daily. 02/11/17   Donne Hazel, MD  insulin aspart (NOVOLOG) 100 UNIT/ML FlexPen Inject 8 Units into the skin every 4 (four) hours. 02/11/17   Donne Hazel, MD  insulin degludec (TRESIBA) 100 UNIT/ML SOPN FlexTouch Pen Inject 0.2 mLs (20 Units total) into the skin daily at 10 pm. PCP to assist with prior authorization if needed 02/11/17   Donne Hazel, MD  Insulin Pen Needle (PEN NEEDLES) 32G X 5 MM MISC 1 Device by Does not apply route See admin instructions. For use with insulin pens. Additional refills per PCP 02/11/17   Donne Hazel, MD  losartan (COZAAR) 25 MG tablet Place 1 tablet (25 mg total) into feeding tube daily. 02/11/17   Donne Hazel, MD  magnesium hydroxide (MILK OF MAGNESIA) 400 MG/5ML suspension Place 30 mLs into feeding tube daily as needed for mild constipation. 01/31/17   David L Rinehuls, PA-C  metoprolol tartrate (LOPRESSOR) 25 MG tablet Place 0.5 tablets (12.5 mg total) into feeding tube 2 (two) times daily. 02/11/17   Donne Hazel, MD  pantoprazole sodium (PROTONIX) 40 mg/20 mL PACK Place 20 mLs (40 mg total) into feeding tube daily. 02/11/17   Donne Hazel, MD  Water For Irrigation, Sterile (FREE WATER) SOLN Place 240 mLs into feeding tube every 6 (six) hours. 01/31/17   Early Chars Rinehuls, PA-C    Family History Family History  Problem Relation Age of Onset  . Diabetes Mother   . CAD  Father     Social History Social History  Substance Use Topics  . Smoking status: Never Smoker  . Smokeless tobacco: Never Used  . Alcohol use No     Allergies   Patient has no known allergies.   Review of Systems Review of Systems  Unable to perform ROS: Patient nonverbal     Physical Exam Updated Vital Signs Ht 5\' 2"  (1.575 m)   Wt 143 lb (64.9 kg)   BMI 26.16 kg/m   Physical Exam  Constitutional: She is oriented to person, place, and time. She appears well-developed and well-nourished. No distress.  Chronically ill-appearing   HENT:  Head: Normocephalic and atraumatic.  Eyes: EOM are normal.  Neck: Normal range of motion.  Cardiovascular: Normal rate, regular rhythm and normal heart sounds.   Pulmonary/Chest: Effort normal and breath sounds normal. Tachypnea noted.  Tracheostomy, very tachypneic   Abdominal: Soft. She exhibits no distension. There is no  tenderness.  G-tube present, insertion site looks unremarkable.  Musculoskeletal: Normal range of motion.  Neurological: She is alert and oriented to person, place, and time.  Skin: Skin is warm and dry.  Psychiatric: She has a normal mood and affect. Judgment normal.  Nursing note and vitals reviewed.    ED Treatments / Results  Labs (all labs ordered are listed, but only abnormal results are displayed) Labs Reviewed  COMPREHENSIVE METABOLIC PANEL - Abnormal; Notable for the following:       Result Value   Chloride 97 (*)    Glucose, Bld 155 (*)    BUN 24 (*)    Creatinine, Ser 1.24 (*)    Albumin 2.0 (*)    AST 89 (*)    ALT 65 (*)    Alkaline Phosphatase 533 (*)    Total Bilirubin 1.9 (*)    GFR calc non Af Amer 42 (*)    GFR calc Af Amer 48 (*)    All other components within normal limits  CBC WITH DIFFERENTIAL/PLATELET - Abnormal; Notable for the following:    WBC 33.5 (*)    RBC 3.68 (*)    Hemoglobin 10.3 (*)    HCT 33.0 (*)    RDW 18.4 (*)    Platelets 141 (*)    Neutro Abs 29.9 (*)     Monocytes Absolute 2.3 (*)    All other components within normal limits  URINALYSIS, ROUTINE W REFLEX MICROSCOPIC - Abnormal; Notable for the following:    Color, Urine AMBER (*)    APPearance HAZY (*)    Glucose, UA 50 (*)    Hgb urine dipstick SMALL (*)    Bilirubin Urine SMALL (*)    Protein, ur 30 (*)    Squamous Epithelial / LPF 0-5 (*)    All other components within normal limits  LACTIC ACID, PLASMA - Abnormal; Notable for the following:    Lactic Acid, Venous 3.5 (*)    All other components within normal limits  PROTIME-INR - Abnormal; Notable for the following:    Prothrombin Time 17.2 (*)    All other components within normal limits  CREATININE, SERUM - Abnormal; Notable for the following:    Creatinine, Ser 1.39 (*)    GFR calc non Af Amer 36 (*)    GFR calc Af Amer 42 (*)    All other components within normal limits  BASIC METABOLIC PANEL - Abnormal; Notable for the following:    Potassium 5.4 (*)    Glucose, Bld 158 (*)    BUN 48 (*)    Creatinine, Ser 1.90 (*)    Calcium 8.4 (*)    GFR calc non Af Amer 25 (*)    GFR calc Af Amer 29 (*)    All other components within normal limits  GLUCOSE, CAPILLARY - Abnormal; Notable for the following:    Glucose-Capillary 137 (*)    All other components within normal limits  GLUCOSE, CAPILLARY - Abnormal; Notable for the following:    Glucose-Capillary 118 (*)    All other components within normal limits  GLUCOSE, CAPILLARY - Abnormal; Notable for the following:    Glucose-Capillary 134 (*)    All other components within normal limits  GLUCOSE, CAPILLARY - Abnormal; Notable for the following:    Glucose-Capillary 139 (*)    All other components within normal limits  HEPARIN LEVEL (UNFRACTIONATED) - Abnormal; Notable for the following:    Heparin Unfractionated 0.26 (*)    All other  components within normal limits  CBC - Abnormal; Notable for the following:    WBC 13.7 (*)    RBC 2.59 (*)    Hemoglobin 7.3 (*)     HCT 22.8 (*)    RDW 18.8 (*)    All other components within normal limits  BASIC METABOLIC PANEL - Abnormal; Notable for the following:    Potassium 5.5 (*)    CO2 20 (*)    Glucose, Bld 159 (*)    BUN 55 (*)    Creatinine, Ser 1.94 (*)    Calcium 8.1 (*)    GFR calc non Af Amer 24 (*)    GFR calc Af Amer 28 (*)    All other components within normal limits  GLUCOSE, CAPILLARY - Abnormal; Notable for the following:    Glucose-Capillary 122 (*)    All other components within normal limits  GLUCOSE, CAPILLARY - Abnormal; Notable for the following:    Glucose-Capillary 130 (*)    All other components within normal limits  HEPARIN LEVEL (UNFRACTIONATED) - Abnormal; Notable for the following:    Heparin Unfractionated 0.14 (*)    All other components within normal limits  CBC - Abnormal; Notable for the following:    WBC 13.7 (*)    RBC 2.50 (*)    Hemoglobin 7.0 (*)    HCT 22.0 (*)    RDW 18.5 (*)    All other components within normal limits  CBC - Abnormal; Notable for the following:    WBC 14.4 (*)    RBC 2.70 (*)    Hemoglobin 7.4 (*)    HCT 23.6 (*)    RDW 18.9 (*)    All other components within normal limits  GLUCOSE, CAPILLARY - Abnormal; Notable for the following:    Glucose-Capillary 158 (*)    All other components within normal limits  GLUCOSE, CAPILLARY - Abnormal; Notable for the following:    Glucose-Capillary 129 (*)    All other components within normal limits  HEPARIN LEVEL (UNFRACTIONATED) - Abnormal; Notable for the following:    Heparin Unfractionated 0.18 (*)    All other components within normal limits  HEPARIN LEVEL (UNFRACTIONATED) - Abnormal; Notable for the following:    Heparin Unfractionated 0.28 (*)    All other components within normal limits  BASIC METABOLIC PANEL - Abnormal; Notable for the following:    CO2 18 (*)    Glucose, Bld 172 (*)    BUN 65 (*)    Creatinine, Ser 2.04 (*)    Calcium 7.6 (*)    GFR calc non Af Amer 23 (*)    GFR  calc Af Amer 26 (*)    All other components within normal limits  GLUCOSE, CAPILLARY - Abnormal; Notable for the following:    Glucose-Capillary 51 (*)    All other components within normal limits  GLUCOSE, CAPILLARY - Abnormal; Notable for the following:    Glucose-Capillary 111 (*)    All other components within normal limits  CBC - Abnormal; Notable for the following:    WBC 16.8 (*)    RBC 2.89 (*)    Hemoglobin 8.0 (*)    HCT 25.4 (*)    RDW 19.3 (*)    All other components within normal limits  CBC - Abnormal; Notable for the following:    WBC 19.2 (*)    RBC 2.63 (*)    Hemoglobin 7.3 (*)    HCT 22.9 (*)    RDW 19.0 (*)  All other components within normal limits  GLUCOSE, CAPILLARY - Abnormal; Notable for the following:    Glucose-Capillary 153 (*)    All other components within normal limits  HEPARIN LEVEL (UNFRACTIONATED) - Abnormal; Notable for the following:    Heparin Unfractionated 0.23 (*)    All other components within normal limits  GLUCOSE, CAPILLARY - Abnormal; Notable for the following:    Glucose-Capillary 151 (*)    All other components within normal limits  BASIC METABOLIC PANEL - Abnormal; Notable for the following:    CO2 17 (*)    Glucose, Bld 160 (*)    BUN 73 (*)    Creatinine, Ser 2.22 (*)    Calcium 7.8 (*)    GFR calc non Af Amer 21 (*)    GFR calc Af Amer 24 (*)    All other components within normal limits  GLUCOSE, CAPILLARY - Abnormal; Notable for the following:    Glucose-Capillary 149 (*)    All other components within normal limits  GLUCOSE, CAPILLARY - Abnormal; Notable for the following:    Glucose-Capillary 135 (*)    All other components within normal limits  CBC - Abnormal; Notable for the following:    WBC 21.1 (*)    RBC 2.59 (*)    Hemoglobin 7.1 (*)    HCT 22.5 (*)    RDW 18.9 (*)    All other components within normal limits  GLUCOSE, CAPILLARY - Abnormal; Notable for the following:    Glucose-Capillary 138 (*)     All other components within normal limits  GLUCOSE, CAPILLARY - Abnormal; Notable for the following:    Glucose-Capillary 155 (*)    All other components within normal limits  GLUCOSE, CAPILLARY - Abnormal; Notable for the following:    Glucose-Capillary 170 (*)    All other components within normal limits  GLUCOSE, CAPILLARY - Abnormal; Notable for the following:    Glucose-Capillary 158 (*)    All other components within normal limits  GLUCOSE, CAPILLARY - Abnormal; Notable for the following:    Glucose-Capillary 153 (*)    All other components within normal limits  CBC - Abnormal; Notable for the following:    WBC 22.5 (*)    RBC 2.72 (*)    Hemoglobin 7.5 (*)    HCT 24.1 (*)    RDW 19.8 (*)    All other components within normal limits  BASIC METABOLIC PANEL - Abnormal; Notable for the following:    CO2 14 (*)    Glucose, Bld 290 (*)    BUN 79 (*)    Creatinine, Ser 2.26 (*)    Calcium 7.5 (*)    GFR calc non Af Amer 20 (*)    GFR calc Af Amer 23 (*)    Anion gap 18 (*)    All other components within normal limits  HEMOGLOBIN AND HEMATOCRIT, BLOOD - Abnormal; Notable for the following:    Hemoglobin 8.7 (*)    HCT 27.4 (*)    All other components within normal limits  GLUCOSE, CAPILLARY - Abnormal; Notable for the following:    Glucose-Capillary 165 (*)    All other components within normal limits  GLUCOSE, CAPILLARY - Abnormal; Notable for the following:    Glucose-Capillary 156 (*)    All other components within normal limits  GLUCOSE, CAPILLARY - Abnormal; Notable for the following:    Glucose-Capillary 171 (*)    All other components within normal limits  GLUCOSE, CAPILLARY - Abnormal; Notable  for the following:    Glucose-Capillary 182 (*)    All other components within normal limits  GLUCOSE, CAPILLARY - Abnormal; Notable for the following:    Glucose-Capillary 163 (*)    All other components within normal limits  GLUCOSE, CAPILLARY - Abnormal; Notable for  the following:    Glucose-Capillary 162 (*)    All other components within normal limits  CBC - Abnormal; Notable for the following:    WBC 25.1 (*)    RBC 2.95 (*)    Hemoglobin 8.1 (*)    HCT 24.7 (*)    RDW 20.1 (*)    All other components within normal limits  BASIC METABOLIC PANEL - Abnormal; Notable for the following:    CO2 9 (*)    Glucose, Bld 207 (*)    BUN 84 (*)    Creatinine, Ser 2.56 (*)    Calcium 7.6 (*)    GFR calc non Af Amer 17 (*)    GFR calc Af Amer 20 (*)    Anion gap 19 (*)    All other components within normal limits  GLUCOSE, CAPILLARY - Abnormal; Notable for the following:    Glucose-Capillary 183 (*)    All other components within normal limits  GLUCOSE, CAPILLARY - Abnormal; Notable for the following:    Glucose-Capillary 198 (*)    All other components within normal limits  GLUCOSE, CAPILLARY - Abnormal; Notable for the following:    Glucose-Capillary 210 (*)    All other components within normal limits  GLUCOSE, CAPILLARY - Abnormal; Notable for the following:    Glucose-Capillary 199 (*)    All other components within normal limits  GLUCOSE, CAPILLARY - Abnormal; Notable for the following:    Glucose-Capillary 217 (*)    All other components within normal limits  CBC - Abnormal; Notable for the following:    WBC 28.2 (*)    RBC 3.03 (*)    Hemoglobin 8.2 (*)    HCT 25.2 (*)    RDW 20.1 (*)    All other components within normal limits  BASIC METABOLIC PANEL - Abnormal; Notable for the following:    CO2 12 (*)    Glucose, Bld 244 (*)    BUN 90 (*)    Creatinine, Ser 2.94 (*)    Calcium 7.9 (*)    GFR calc non Af Amer 15 (*)    GFR calc Af Amer 17 (*)    Anion gap 19 (*)    All other components within normal limits  GLUCOSE, CAPILLARY - Abnormal; Notable for the following:    Glucose-Capillary 179 (*)    All other components within normal limits  GLUCOSE, CAPILLARY - Abnormal; Notable for the following:    Glucose-Capillary 192 (*)      All other components within normal limits  GLUCOSE, CAPILLARY - Abnormal; Notable for the following:    Glucose-Capillary 214 (*)    All other components within normal limits  GLUCOSE, CAPILLARY - Abnormal; Notable for the following:    Glucose-Capillary 234 (*)    All other components within normal limits  GLUCOSE, CAPILLARY - Abnormal; Notable for the following:    Glucose-Capillary 158 (*)    All other components within normal limits  GLUCOSE, CAPILLARY - Abnormal; Notable for the following:    Glucose-Capillary 250 (*)    All other components within normal limits  CBC - Abnormal; Notable for the following:    WBC 28.3 (*)    RBC 3.21 (*)  Hemoglobin 8.7 (*)    HCT 26.8 (*)    RDW 19.8 (*)    All other components within normal limits  GLUCOSE, CAPILLARY - Abnormal; Notable for the following:    Glucose-Capillary 249 (*)    All other components within normal limits  GLUCOSE, CAPILLARY - Abnormal; Notable for the following:    Glucose-Capillary 288 (*)    All other components within normal limits  GLUCOSE, CAPILLARY - Abnormal; Notable for the following:    Glucose-Capillary 241 (*)    All other components within normal limits  GLUCOSE, CAPILLARY - Abnormal; Notable for the following:    Glucose-Capillary 266 (*)    All other components within normal limits  GLUCOSE, CAPILLARY - Abnormal; Notable for the following:    Glucose-Capillary 223 (*)    All other components within normal limits  GLUCOSE, CAPILLARY - Abnormal; Notable for the following:    Glucose-Capillary 288 (*)    All other components within normal limits  GLUCOSE, CAPILLARY - Abnormal; Notable for the following:    Glucose-Capillary 270 (*)    All other components within normal limits  HEPARIN LEVEL (UNFRACTIONATED) - Abnormal; Notable for the following:    Heparin Unfractionated <0.10 (*)    All other components within normal limits  CBC - Abnormal; Notable for the following:    WBC 26.4 (*)    RBC  3.06 (*)    Hemoglobin 8.1 (*)    HCT 25.8 (*)    RDW 20.2 (*)    All other components within normal limits  BASIC METABOLIC PANEL - Abnormal; Notable for the following:    Chloride 113 (*)    CO2 12 (*)    Glucose, Bld 207 (*)    BUN 102 (*)    Creatinine, Ser 3.69 (*)    Calcium 8.2 (*)    GFR calc non Af Amer 11 (*)    GFR calc Af Amer 13 (*)    Anion gap 18 (*)    All other components within normal limits  GLUCOSE, CAPILLARY - Abnormal; Notable for the following:    Glucose-Capillary 257 (*)    All other components within normal limits  GLUCOSE, CAPILLARY - Abnormal; Notable for the following:    Glucose-Capillary 208 (*)    All other components within normal limits  GLUCOSE, CAPILLARY - Abnormal; Notable for the following:    Glucose-Capillary 207 (*)    All other components within normal limits  GLUCOSE, CAPILLARY - Abnormal; Notable for the following:    Glucose-Capillary 238 (*)    All other components within normal limits  GLUCOSE, CAPILLARY - Abnormal; Notable for the following:    Glucose-Capillary 224 (*)    All other components within normal limits  CBC - Abnormal; Notable for the following:    WBC 29.8 (*)    RBC 3.08 (*)    Hemoglobin 8.1 (*)    HCT 25.8 (*)    RDW 20.5 (*)    All other components within normal limits  BASIC METABOLIC PANEL - Abnormal; Notable for the following:    CO2 13 (*)    Glucose, Bld 265 (*)    BUN 105 (*)    Creatinine, Ser 4.12 (*)    Calcium 8.1 (*)    GFR calc non Af Amer 10 (*)    GFR calc Af Amer 11 (*)    Anion gap 18 (*)    All other components within normal limits  GLUCOSE, CAPILLARY - Abnormal; Notable for  the following:    Glucose-Capillary 302 (*)    All other components within normal limits  GLUCOSE, CAPILLARY - Abnormal; Notable for the following:    Glucose-Capillary 325 (*)    All other components within normal limits  GLUCOSE, CAPILLARY - Abnormal; Notable for the following:    Glucose-Capillary 305 (*)      All other components within normal limits  GLUCOSE, CAPILLARY - Abnormal; Notable for the following:    Glucose-Capillary 246 (*)    All other components within normal limits  GLUCOSE, CAPILLARY - Abnormal; Notable for the following:    Glucose-Capillary 266 (*)    All other components within normal limits  GLUCOSE, CAPILLARY - Abnormal; Notable for the following:    Glucose-Capillary 249 (*)    All other components within normal limits  GLUCOSE, CAPILLARY - Abnormal; Notable for the following:    Glucose-Capillary 255 (*)    All other components within normal limits  GLUCOSE, CAPILLARY - Abnormal; Notable for the following:    Glucose-Capillary 232 (*)    All other components within normal limits  GLUCOSE, CAPILLARY - Abnormal; Notable for the following:    Glucose-Capillary 209 (*)    All other components within normal limits  GLUCOSE, CAPILLARY - Abnormal; Notable for the following:    Glucose-Capillary 226 (*)    All other components within normal limits  GLUCOSE, CAPILLARY - Abnormal; Notable for the following:    Glucose-Capillary 227 (*)    All other components within normal limits  GLUCOSE, CAPILLARY - Abnormal; Notable for the following:    Glucose-Capillary 179 (*)    All other components within normal limits  GLUCOSE, CAPILLARY - Abnormal; Notable for the following:    Glucose-Capillary 124 (*)    All other components within normal limits  GLUCOSE, CAPILLARY - Abnormal; Notable for the following:    Glucose-Capillary 122 (*)    All other components within normal limits  GLUCOSE, CAPILLARY - Abnormal; Notable for the following:    Glucose-Capillary 155 (*)    All other components within normal limits  GLUCOSE, CAPILLARY - Abnormal; Notable for the following:    Glucose-Capillary 162 (*)    All other components within normal limits  GLUCOSE, CAPILLARY - Abnormal; Notable for the following:    Glucose-Capillary 116 (*)    All other components within normal limits   GLUCOSE, CAPILLARY - Abnormal; Notable for the following:    Glucose-Capillary 119 (*)    All other components within normal limits  GLUCOSE, CAPILLARY - Abnormal; Notable for the following:    Glucose-Capillary 130 (*)    All other components within normal limits  GLUCOSE, CAPILLARY - Abnormal; Notable for the following:    Glucose-Capillary 165 (*)    All other components within normal limits  GLUCOSE, CAPILLARY - Abnormal; Notable for the following:    Glucose-Capillary 183 (*)    All other components within normal limits  GLUCOSE, CAPILLARY - Abnormal; Notable for the following:    Glucose-Capillary 154 (*)    All other components within normal limits  GLUCOSE, CAPILLARY - Abnormal; Notable for the following:    Glucose-Capillary 122 (*)    All other components within normal limits  GLUCOSE, CAPILLARY - Abnormal; Notable for the following:    Glucose-Capillary 129 (*)    All other components within normal limits  GLUCOSE, CAPILLARY - Abnormal; Notable for the following:    Glucose-Capillary 137 (*)    All other components within normal limits  GLUCOSE, CAPILLARY -  Abnormal; Notable for the following:    Glucose-Capillary 130 (*)    All other components within normal limits  GLUCOSE, CAPILLARY - Abnormal; Notable for the following:    Glucose-Capillary 100 (*)    All other components within normal limits  GLUCOSE, CAPILLARY - Abnormal; Notable for the following:    Glucose-Capillary 141 (*)    All other components within normal limits  GLUCOSE, CAPILLARY - Abnormal; Notable for the following:    Glucose-Capillary 133 (*)    All other components within normal limits  GLUCOSE, CAPILLARY - Abnormal; Notable for the following:    Glucose-Capillary 129 (*)    All other components within normal limits  I-STAT CG4 LACTIC ACID, ED - Abnormal; Notable for the following:    Lactic Acid, Venous 4.59 (*)    All other components within normal limits  I-STAT ARTERIAL BLOOD GAS, ED -  Abnormal; Notable for the following:    pH, Arterial 7.518 (*)    pCO2 arterial 26.7 (*)    All other components within normal limits  CULTURE, BLOOD (ROUTINE X 2)  CULTURE, BLOOD (ROUTINE X 2)  BODY FLUID CULTURE  MRSA PCR SCREENING  C DIFFICILE QUICK SCREEN W PCR REFLEX  PROCALCITONIN  APTT  CK  VANCOMYCIN, RANDOM  HEPARIN LEVEL (UNFRACTIONATED)  HEPARIN LEVEL (UNFRACTIONATED)  HEPARIN LEVEL (UNFRACTIONATED)  HEPARIN LEVEL (UNFRACTIONATED)  HEPARIN LEVEL (UNFRACTIONATED)  HEPARIN LEVEL (UNFRACTIONATED)  HEPARIN LEVEL (UNFRACTIONATED)  HEPARIN LEVEL (UNFRACTIONATED)  GLUCOSE, CAPILLARY  GLUCOSE, CAPILLARY  GLUCOSE, CAPILLARY  GLUCOSE, CAPILLARY  GLUCOSE, CAPILLARY  GLUCOSE, CAPILLARY  I-STAT CG4 LACTIC ACID, ED  TYPE AND SCREEN  PREPARE RBC (CROSSMATCH)  ABO/RH    EKG  EKG Interpretation  Date/Time:  Monday February 17 2017 09:18:55 EDT Ventricular Rate:  115 PR Interval:    QRS Duration: 74 QT Interval:  352 QTC Calculation: 485 R Axis:     Text Interpretation:  Sinus tachycardia Multiform ventricular premature complexes Inferior infarct, age indeterminate Tall R wave in V2, consider RVH or PMI Consider anterolateral infarct Baseline wander in lead(s) I III aVL Technically poor tracing Confirmed by LIU MD, DANA ([id number]) on 02/18/2017 12:16:15 PM       Radiology No results found.  Procedures Procedures (including critical care time)  Medications Ordered in ED Medications - No data to display   Initial Impression / Assessment and Plan / ED Course  I have reviewed the triage vital signs and the nursing notes.  Pertinent labs & imaging results that were available during my care of the patient were reviewed by me and considered in my medical decision making (see chart for details).      Final Clinical Impressions(s) / ED Diagnoses   Final diagnoses:  Hypoxia  SOB (shortness of breath)  Hypoactive  Ileus (HCC)  Dyspnea  PEG tube malfunction (HCC)    Atrial fibrillation (HCC)  Atrial fibrillation, unspecified type [facility])    New Prescriptions New Prescriptions   No medications on file   I personally preformed the services scribed in my presence. The recorded information has been reviewed is accurate. Virgel Manifold, MD.    Virgel Manifold, MD 03/04/17 [phone]

## 2017-02-18 ENCOUNTER — Inpatient Hospital Stay (HOSPITAL_COMMUNITY): Payer: Medicare Other

## 2017-02-18 DIAGNOSIS — Z66 Do not resuscitate: Secondary | ICD-10-CM

## 2017-02-18 DIAGNOSIS — R609 Edema, unspecified: Secondary | ICD-10-CM

## 2017-02-18 DIAGNOSIS — Z515 Encounter for palliative care: Secondary | ICD-10-CM

## 2017-02-18 DIAGNOSIS — C189 Malignant neoplasm of colon, unspecified: Secondary | ICD-10-CM

## 2017-02-18 LAB — GLUCOSE, CAPILLARY
GLUCOSE-CAPILLARY: 122 mg/dL — AB (ref 65–99)
Glucose-Capillary: 134 mg/dL — ABNORMAL HIGH (ref 65–99)
Glucose-Capillary: 139 mg/dL — ABNORMAL HIGH (ref 65–99)
Glucose-Capillary: 130 mg/dL — ABNORMAL HIGH (ref 65–99)

## 2017-02-18 LAB — BASIC METABOLIC PANEL
Anion gap: 12 (ref 5–15)
BUN: 48 mg/dL — AB (ref 6–20)
CHLORIDE: 103 mmol/L (ref 101–111)
CO2: 22 mmol/L (ref 22–32)
CREATININE: 1.9 mg/dL — AB (ref 0.44–1.00)
Calcium: 8.4 mg/dL — ABNORMAL LOW (ref 8.9–10.3)
GFR calc Af Amer: 29 mL/min — ABNORMAL LOW (ref >60)
GFR calc non Af Amer: 25 mL/min — ABNORMAL LOW (ref >60)
GLUCOSE: 158 mg/dL — AB (ref 65–99)
POTASSIUM: 5.4 mmol/L — AB (ref 3.5–5.1)
SODIUM: 137 mmol/L (ref 135–145)

## 2017-02-18 LAB — MRSA PCR SCREENING: MRSA by PCR: NEGATIVE

## 2017-02-18 MED ORDER — ORAL CARE MOUTH RINSE
15.0000 mL | Freq: Two times a day (BID) | OROMUCOSAL | Status: DC
  Administered 2017-02-18 – 2017-03-04 (×24): 15 mL via OROMUCOSAL

## 2017-02-18 MED ORDER — MORPHINE SULFATE (PF) 2 MG/ML IV SOLN
2.0000 mg | Freq: Once | INTRAVENOUS | Status: AC
  Administered 2017-02-18: 2 mg via INTRAVENOUS
  Filled 2017-02-18: qty 1

## 2017-02-18 MED ORDER — ACETAMINOPHEN 650 MG RE SUPP
650.0000 mg | Freq: Four times a day (QID) | RECTAL | Status: DC | PRN
  Administered 2017-02-18 – 2017-02-19 (×2): 650 mg via RECTAL
  Filled 2017-02-18 (×2): qty 1

## 2017-02-18 MED ORDER — ACETAMINOPHEN 325 MG PO TABS
650.0000 mg | ORAL_TABLET | Freq: Four times a day (QID) | ORAL | Status: DC | PRN
  Administered 2017-02-18: 650 mg

## 2017-02-18 MED ORDER — HEPARIN (PORCINE) IN NACL 100-0.45 UNIT/ML-% IJ SOLN
1750.0000 [IU]/h | INTRAMUSCULAR | Status: DC
  Administered 2017-02-18: 1000 [IU]/h via INTRAVENOUS
  Administered 2017-02-19: 1300 [IU]/h via INTRAVENOUS
  Administered 2017-02-21 – 2017-02-27 (×7): 1750 [IU]/h via INTRAVENOUS
  Filled 2017-02-18 (×13): qty 250

## 2017-02-18 MED ORDER — SODIUM CHLORIDE 0.9 % IV SOLN
INTRAVENOUS | Status: DC
  Administered 2017-02-18 – 2017-02-22 (×6): via INTRAVENOUS

## 2017-02-18 MED ORDER — MORPHINE SULFATE (PF) 2 MG/ML IV SOLN
0.5000 mg | INTRAVENOUS | Status: DC | PRN
  Administered 2017-02-18 – 2017-02-27 (×2): 0.5 mg via INTRAVENOUS
  Filled 2017-02-18 (×2): qty 1

## 2017-02-18 MED ORDER — ALBUTEROL SULFATE (2.5 MG/3ML) 0.083% IN NEBU: 2.5000 mg | INHALATION_SOLUTION | RESPIRATORY_TRACT | Status: DC | PRN

## 2017-02-18 NOTE — Progress Notes (Addendum)
*  PRELIMINARY RESULTS* Vascular Ultrasound Lower extremity venous duplex has been completed.  Preliminary findings: DVT noted in the Right common femoral, femoral, popliteal, gastroc, posterior tibial, and peroneal veins.  More proximal veins were evaluated which revealed DVT in the distal right iliac vein as well. The mid right iliac appears patent without occlusive thrombus, although non-occlusive thrombus cannot be ruled out. Could not image IVC due to bandages on abdomen. Short segment of DVT noted in the Left soleal vein.    Called results to [place], [place], RDMS, RVT  02/18/2017, 2:16 PM

## 2017-02-18 NOTE — Progress Notes (Signed)
Notified Dr. Eliseo Squires that the patient's BP is dropping and her respirations are high. BP 87/44 resp 41, temp 100.5. She has not voided all day. Bladder scanned her with 326ml. She said we will move her to a step-down unit. She wants to wait to place a foley until she reaches 564ml because she feels like she is really dehydrated.

## 2017-02-18 NOTE — Progress Notes (Signed)
Late Entry  Patient arrived to 4E14 from 57W. Patient noted to be slightly hypotensive, tachycardic, and tachypneic (labored breathing, RR >40, and using accessory muscles). Rectal temp 100.8. O2 sats stable >95% on trach collar. Rhonchi present bilaterally. Trach supplies all placed at bedside. Abdomen with peg tube present - thick, tan drainage noted around site. Unable to auscultate bowel sounds, family unsure of LBM - Dr. Eliseo Squires notified, KUB ordered and completed. Patient has no urine output documented since yesterday, bladder scanned 365ml - Dr. Eliseo Squires notified, orders to place foley catheter if bladder scan >500. Sacrum/buttocks noted to have an unstageable wound, questionable DTI - foam dressing placed and WOC consult ordered (Dr. Eliseo Squires aware). Patient with severe RLE DVT, heparin drip infusing, unable to palpate pulses on right foot - PT pulse located with doppler, but unable to locate DP pulse, even with doppler - Dr. Eliseo Squires aware. Patient is minimally responsive to pain, able to open eyes, pupils equal and reactive, but unable to follow commands or move any extremities. This is comparable to recent baseline, per patient's family. Family at bedside and updated about all of the above and the current plan of care. Oncoming nurse informed of all of the above.   Joellen Jersey, RN.

## 2017-02-18 NOTE — Progress Notes (Signed)
ANTICOAGULATION CONSULT NOTE Pharmacy Consult for heparin  Indication: DVT  No Known Allergies  Patient Measurements: Height: 5\' 2"  (157.5 cm) Weight: 153 lb 14.1 oz (69.8 kg) IBW/kg (Calculated) : 50.1 Heparin Dosing Weight: 64 kg  Vital Signs: Temp: 100.5 F (38.1 C) (03/27 1435) Temp Source: Axillary (03/27 1435) BP: 87/44 (03/27 1435) Pulse Rate: 83 (03/27 1435)  Labs:  Recent Labs  02/17/17 1012 02/17/17 1621 02/18/17 1246  HGB 10.3*  --   --   HCT 33.0*  --   --   PLT 141*  --   --   APTT  --  34  --   LABPROT  --  17.2*  --   INR  --  1.39  --   CREATININE 1.24* 1.39* 1.90*  CKTOTAL  --  43  --     Estimated Creatinine Clearance: 23.8 mL/min (A) (by C-G formula based on SCr of 1.9 mg/dL (H)).   Medical History: Past Medical History:  Diagnosis Date  . Arthritis   . Colon cancer ([place]) 2009  . DVT (deep venous thrombosis) ([place]) 01/2017   RLE  . Hyperlipidemia   . Hypertension   . SIRS (systemic inflammatory response syndrome) ([place]) 02/17/2017  . Stroke [facility])    "was told she had strokes in 12/2016 & 01/2017" (02/17/2017)  . Type II diabetes mellitus ([place])   . UTI (urinary tract infection)     Assessment: 76 yo female to start on heparin for new DVT in setting of metastatic colon cancer. Hgb 10.3 and PLTc 141.   Goal of Therapy:  Heparin level 0.3-0.7 units/ml Monitor platelets by anticoagulation protocol: Yes   Plan:  Begin heparin infusion at 1000 units/hr  Heparin level in 8 hours and daily  Follow up on long term a/c plans   Vincenza Hews, PharmD, BCPS 02/18/2017, 4:20 PM

## 2017-02-18 NOTE — Progress Notes (Signed)
@  approx 2300 [address] of [place] paged regarding pt's sustained RR in 40s-50s, JVD, LA of 3.5 on 3/26, and ABD Xray indicating possible ileus. Orders received for (1) 2mg  IV Morphine to hopefully help slow pt's RR, and (2) to hold Free H2O flushes through the night. Morphine administered and update given to pt's eldest daughter at bedside who is amenable with plan. Will continue to monitor and assess.

## 2017-02-18 NOTE — Progress Notes (Addendum)
Called by nursing to report increase in respirations.  Will plan to transfer to SDU. Family still wants to continue abx and treat "treatable conditions.  Chest xray pending.  Family also wants to treat DVT in right leg.  Discussed why previous anticoagulation was stopped last admission due to anemia and she may become anemic again but they feel that the clot is causing her discomfort. Will start heparin gtt.  Eulogio Bear DO

## 2017-02-18 NOTE — Progress Notes (Signed)
Nutrition Brief Note  Chart reviewed. Pt now transitioning to comfort care.  No further nutrition interventions warranted at this time.  Please re-consult as needed.   Jaydrian Corpening A. Aireona Torelli, RD, LDN, CDE Pager: [phone] After hours Pager: [phone]  

## 2017-02-18 NOTE — Progress Notes (Signed)
[facility]  Patient Status: Active (receiving services up to time of hospitalization)  [facility] is providing the following services: RN, PT, OT, ST, MSW and HHA  If patient discharges after hours, please call [phone].   April Franklin 02/18/2017, 9:46 AM

## 2017-02-18 NOTE — Progress Notes (Signed)
PROGRESS NOTE    April Franklin  GHW:[id number] DOB: 1941/07/13 DOA: 02/17/2017 PCP: [name], MD   Outpatient Specialists:     Brief Narrative:  April Franklin is a 76 y.o. female who presents with sepsis likely from respiratory source w/ concern for aspiration.Of note pt overall prognosis very poor and family has vascilated w/ regards to her overall GOC. Currently pt is DNR and ok w/o escelating of current therapy and aware that pt may pass from current acute illness. Palliative care consult will be key in determining pts overall prognosis and in establishing [place]. Recommending comfort care and hospice.   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 Patient is febrile with tachycardia and tachypnea, leukocytosis, elevated lactic acid and acute kidney injury, ams  -resume IV fluids -Continue broad-spectrum antibiotics -Follow blood cultures -lactic acid elevated -Monitor urine output -Palliative care consult for [place] -patient has an overall poor prognosis -plan to repeat x ray after hydration  Acute kidney injury. 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 Status post tracheostomy 2 hospitalizations ago. -anti-tussive -scopolamine -oxygen supplementation as indicated -Antibiotics as noted above  Acute on chronic encephalopathy -infection vs worsening CVA  -Chart review/notes indicate opined very poor prognosis.  -recommend comfort care to  family -palliative care consult -Aspirin stopped last hospitalization   Hypertension. -patient currently hypotension -holding parameters on BP meds  Diabetes.  -hold lantus -SSI  Lower extremity edema- R>L -DVT 2 hospitalizations ago.  -Anticoagulation stopped last hospitalization due to anemia -Synovial fluid aspirated in ED-- culture NGTD-- fluid studies pending   Recent NSTEMI - no echo done since increased troponin -no intervention due to patient's poor prognosis/current state  Pressure Injury 02/17/17 Stage II - Partial thickness loss of dermis presenting as a shallow open ulcer with a red, pink wound bed without slough.   DVT prophylaxis:  SQ Heparin  Code Status: DNR   Family Communication: Daughter at bedside  Disposition Plan:     Consultants:   Palliative care     Subjective: Patient not able to converse  Objective: Vitals:   02/18/17 0427 02/18/17 0544 02/18/17 0800 02/18/17 0806  BP:  (!) 95/49  (!) 94/48  Pulse: (!) 113 (!) 111 (!) 107 (!) 108  Resp: (!) 30 (!) 30 (!) 32 (!) 33  Temp:  97.6 F (36.4 C)  98.5 F (36.9 C)  TempSrc:  Oral  Oral  SpO2: 100%  100% 100%  Weight:      Height:        Intake/Output Summary (Last 24 hours) at 02/18/17 1054 Last data filed at 02/18/17 0656  Gross per 24 hour  Intake              530 ml  Output              225 ml  Net              305 ml   Filed Weights   02/17/17 0925 02/17/17 1542  Weight:  64.9 kg (143 lb) 69.8 kg (153 lb 14.1 oz)    Examination:  General exam: ill appearing- trach and PEG in place Respiratory system: Coarse breath sounds Cardiovascular system: tachy  Gastrointestinal system: peg in place Central nervous system: Not able to cooperate with exam Skin: decubitus Psychiatry: unable to assess    Data Reviewed: I have personally reviewed following labs and imaging studies  CBC:  Recent Labs Lab 02/17/17 1012  WBC 33.5*  NEUTROABS 29.9*  HGB 10.3*  HCT  33.0*  MCV 89.7  PLT 025*   Basic Metabolic Panel:  Recent Labs Lab 02/17/17 1012 02/17/17 1621  NA 136  --   K 4.9  --   CL 97*  --   CO2 24  --   GLUCOSE 155*  --   BUN 24*  --   CREATININE 1.24* 1.39*  CALCIUM 9.9  --    GFR: Estimated Creatinine Clearance: 32.5 mL/min (A) (by C-G formula based on SCr of 1.39 mg/dL (H)). Liver Function Tests:  Recent Labs Lab 02/17/17 1012  AST 89*  ALT 65*  ALKPHOS 533*  BILITOT 1.9*  PROT 6.5  ALBUMIN 2.0*   No results for input(s): LIPASE, AMYLASE in the last 168 hours. No results for input(s): AMMONIA in the last 168 hours. Coagulation Profile:  Recent Labs Lab 02/17/17 1621  INR 1.39   Cardiac Enzymes:  Recent Labs Lab 02/17/17 1621  CKTOTAL 43   BNP (last 3 results) No results for input(s): PROBNP in the last 8760 hours. HbA1C: No results for input(s): HGBA1C in the last 72 hours. CBG:  Recent Labs Lab 02/11/17 1207 02/11/17 1556 02/17/17 1740 02/17/17 2116 02/18/17 0802  GLUCAP 154* 168* 137* 118* 134*   Lipid Profile: No results for input(s): CHOL, HDL, LDLCALC, TRIG, CHOLHDL, LDLDIRECT in the last 72 hours. Thyroid Function Tests: No results for input(s): TSH, T4TOTAL, FREET4, T3FREE, THYROIDAB in the last 72 hours. Anemia Panel: No results for input(s): VITAMINB12, FOLATE, FERRITIN, TIBC, IRON, RETICCTPCT in the last 72 hours. Urine analysis:    Component Value Date/Time   COLORURINE AMBER (A) 02/17/2017 1315   APPEARANCEUR HAZY (A) 02/17/2017 1315   LABSPEC 1.028 02/17/2017 1315   PHURINE 5.0 02/17/2017 1315   GLUCOSEU 50 (A) 02/17/2017 1315   HGBUR SMALL (A) 02/17/2017 1315   BILIRUBINUR SMALL (A) 02/17/2017 1315   KETONESUR NEGATIVE 02/17/2017 1315   PROTEINUR 30 (A) 02/17/2017 1315   NITRITE NEGATIVE 02/17/2017 1315   LEUKOCYTESUR NEGATIVE 02/17/2017 1315      Recent Results (from the past 240 hour(s))  Blood Culture (routine x 2)     Status: None (Preliminary result)    Collection Time: 02/17/17 10:13 AM  Result Value Ref Range Status   Specimen Description BLOOD LEFT ANTECUBITAL  Final   Special Requests IN PEDIATRIC BOTTLE 4CC  Final   Culture NO GROWTH < 12 HOURS  Final   Report Status PENDING  Incomplete  Blood Culture (routine x 2)     Status: None (Preliminary result)   Collection Time: 02/17/17 10:15 AM  Result Value Ref Range Status   Specimen Description BLOOD LEFT HAND  Final   Special Requests AEROBIC BOTTLE ONLY 6CC  Final   Culture NO GROWTH < 12 HOURS  Final   Report Status PENDING  Incomplete  Body fluid culture     Status: None (Preliminary result)   Collection Time: 02/17/17 12:48 PM  Result Value Ref Range Status   Specimen Description SYNOVIAL RIGHT KNEE  Final   Special Requests NONE  Final   Gram Stain   Final    MODERATE WBC PRESENT, PREDOMINANTLY MONONUCLEAR RARE WBC PRESENT, PREDOMINANTLY PMN NO ORGANISMS SEEN    Culture NO GROWTH < 24 HOURS  Final   Report Status PENDING  Incomplete      Anti-infectives    Start     Dose/Rate Route Frequency Ordered Stop   02/18/17 1200  vancomycin (VANCOCIN) IVPB 1000 mg/200 mL premix     1,000 mg 200 mL/hr over 60 Minutes Intravenous Every 24 hours 02/17/17 1108     02/17/17 1600  piperacillin-tazobactam (ZOSYN) IVPB 3.375 g     3.375 g 12.5 mL/hr over 240 Minutes Intravenous Every 8 hours 02/17/17 1108     02/17/17 1330  piperacillin-tazobactam (ZOSYN) IVPB 3.375 g  Status:  Discontinued     3.375 g 100 mL/hr over 30 Minutes Intravenous  Once 02/17/17 1317 02/17/17 1322   02/17/17 1330  vancomycin (VANCOCIN) IVPB 1000 mg/200 mL premix  Status:  Discontinued     1,000 mg 200 mL/hr over 60 Minutes Intravenous  Once 02/17/17 1317 02/17/17 1322   02/17/17 1000  piperacillin-tazobactam (ZOSYN) IVPB 3.375 g     3.375 g 100 mL/hr over 30 Minutes Intravenous  Once 02/17/17 0945 02/17/17 1038   02/17/17 1000  vancomycin (VANCOCIN) IVPB 1000 mg/200 mL premix  Status:  Discontinued      1,000 mg 200 mL/hr over 60 Minutes Intravenous  Once 02/17/17 0945 02/17/17 0953   02/17/17 1000  vancomycin (VANCOCIN) 1,250 mg in sodium chloride 0.9 % 250 mL IVPB     1,250 mg 166.7 mL/hr over 90 Minutes Intravenous  Once 02/17/17 0953 02/17/17 1306       Radiology Studies: Dg Chest Portable 1 View  Result Date: 02/17/2017 CLINICAL DATA:  Fever and tachypnea. EXAM: PORTABLE CHEST 1 VIEW COMPARISON:  02/04/2017 FINDINGS: Tracheostomy tube tip is above the carina. The heart size is mildly enlarged. There is mild interstitial edema. No airspace opacities. IMPRESSION: 1. Cardiac enlargement and mild edema. Electronically Signed   By: Kerby Moors M.D.   On: 02/17/2017 10:53        Scheduled Meds: . acetaminophen  650 mg Per Tube Once  . albuterol  2.5 mg Nebulization Q6H  . docusate  50 mg Per Tube QHS  . free water  240 mL Per Tube Q6H  . heparin  5,000 Units Subcutaneous Q8H  . insulin aspart  0-5 Units Subcutaneous QHS  . insulin aspart  0-9 Units Subcutaneous TID WC  . metoprolol tartrate  12.5 mg Per Tube BID  . pantoprazole sodium  40 mg Per Tube Daily  . piperacillin-tazobactam (ZOSYN)  IV  3.375 g Intravenous Q8H  . scopolamine  1 patch Transdermal Q72H  . sodium chloride flush  3 mL Intravenous Q12H  . vancomycin  1,000 mg Intravenous Q24H   Continuous Infusions: . sodium chloride       LOS: 1 day    Time spent: 35 min    [place], DO [facility] Pager [phone]  If 7PM-7AM, please contact night-coverage [email or web address] Password [facility] 02/18/2017, 10:54 AM

## 2017-02-18 NOTE — Consult Note (Signed)
Consultation Note Date: 02/18/2017   Patient Name: April Franklin  DOB: 1941/10/27  MRN: [id number]  Age / Sex: 76 y.o., female  PCP: [name], MD Referring Physician: Geradine Girt, DO  Reason for Consultation: Establishing goals of care and Psychosocial/spiritual support  HPI/Patient Profile: 76 y.o. 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   Per family patient was discharged to home approximately a week ago and did "well" until 3 days ago when she developed intermittent fever. Max temp noted at being 102.2. Associated symptoms include emesis. Denies any coffee ground emesis. In addition they reported leaking around the G-tube   In the  Emergency department she had a temperature 102.2 rectally she is tachycardic tachypnea. She is provided with vigorous IV fluids vancomycin and zosyn  Family continues to face EOL decisions as it relates to continuation of medical interventions in an attempt to prolong life.  They have meet with PMT in the past and have been very clear that they desire all offered and available medical interventions to prolong life.     Clinical Assessment and Goals of Care:    This NP [name] reviewed medical records, received report from team, assessed the patient and then meet at the patient's bedside along with her husband and daughter/Sandra  to discuss diagnosis, prognosis, GOC, EOL wishes disposition and options.  Family quickly interuppted any information from me and  then  clearly and adamantly verbalized  their desires are based in a strong "Holiness faith" and until they receive the message to release her this will remain their plan.    Questions and concerns addressed.   Family did verbalize their concerns regarding previous hospitalizations in feeling "pushed out" and "poor communication regarding medication prescriptions and administration"  I offered support, recommended they seek out  a PCP who would come to the home.    I will try to help family in  navigating disposition home when/if  patient is discharged form the hospital.  Family encouraged to call with questions or concerns.  PMT will continue to support holistically.     SUMMARY OF RECOMMENDATIONS    Code Status/Advance Care Planning:  DNR   Palliative Prophylaxis:   Aspiration, Bowel Regimen, Delirium Protocol and Oral Care  Additional Recommendations (Limitations, Scope, Preferences):  Full Scope Treatment  Psycho-social/Spiritual:   Desire for further Chaplaincy support:no- strong family faith support  Prognosis:   < 6 weeks  Discharge Planning: Home with Home Health      Primary Diagnoses: Present on Admission: . Colon cancer ([place]) . Encephalopathy . Diabetes mellitus with complication ([place]) . Hypertension . SIRS (systemic inflammatory response syndrome) ([facility]) . Sepsis ([place]) . Acute kidney injury ([place]) . Acute on chronic respiratory  failure [facility]) s/p Feinstein Tracheostomy 01/16/17   I have reviewed the medical record, interviewed the patient and family, and examined the patient. The following aspects are pertinent.  Past Medical History:  Diagnosis Date  . Arthritis   . Colon cancer ([place]) 2009  . DVT (deep venous thrombosis) ([place]) 01/2017   RLE  . Hyperlipidemia   . Hypertension   . SIRS (systemic inflammatory response syndrome) ([place]) 02/17/2017  . Stroke [facility])    "was told she had strokes in 12/2016 & 01/2017" (02/17/2017)  . Type II diabetes mellitus  ([place])   . UTI (urinary tract infection)    Social History   Social History  . Marital status: Married    Spouse name: N/A  . Number of children: N/A  . Years of education: N/A   Social History Main Topics  . Smoking status: Never Smoker  . Smokeless tobacco: Never Used  . Alcohol use No  . Drug use: No  . Sexual activity: No   Other Topics Concern  . None   Social History Narrative  . None   Family History  Problem Relation Age of Onset  . Diabetes Mother   . CAD Father    Scheduled Meds: . acetaminophen  650 mg Per Tube Once  . albuterol  2.5 mg Nebulization Q6H  . docusate  50 mg Per Tube QHS  . free water  240 mL Per Tube Q6H  . heparin  5,000 Units Subcutaneous Q8H  . insulin aspart  0-5 Units Subcutaneous QHS  . insulin aspart  0-9 Units Subcutaneous TID WC  . mouth rinse  15 mL Mouth Rinse q12n4p  . metoprolol tartrate  12.5 mg Per Tube BID  . pantoprazole sodium  40 mg Per Tube Daily  . piperacillin-tazobactam (ZOSYN)  IV  3.375 g Intravenous Q8H  . scopolamine  1 patch Transdermal Q72H  . sodium chloride flush  3 mL Intravenous Q12H  . vancomycin  1,000 mg Intravenous Q24H   Continuous Infusions: . sodium chloride 50 mL/hr at 02/18/17 1104   PRN Meds:.acetaminophen **OR** acetaminophen, magnesium hydroxide, morphine injection, ondansetron **OR** ondansetron (ZOFRAN) IV Medications Prior to Admission:  Prior to Admission medications   Medication Sig Start Date End Date Taking? Authorizing Provider  chlorhexidine (PERIDEX) 0.12 % solution 15 mLs by Mouth Rinse route 2 (two) times daily. 02/11/17  Yes Donne Hazel, MD  docusate (COLACE) 50 MG/5ML liquid Place 5 mLs (50 mg total) into feeding tube at bedtime. 01/31/17  Yes David L Rinehuls, PA-C  guaiFENesin (MUCINEX) 600 MG 12 hr tablet Take 2 tablets (1,200 mg total) by mouth 2 (two) times daily. 02/11/17  Yes Donne Hazel, MD  insulin aspart (NOVOLOG) 100 UNIT/ML FlexPen Inject 8 Units into the skin every  4 (four) hours. 02/11/17  Yes Donne Hazel, MD  insulin degludec (TRESIBA) 100 UNIT/ML SOPN FlexTouch Pen Inject 0.2 mLs (20 Units total) into the skin daily at 10 pm. PCP to assist with prior authorization if needed 02/11/17  Yes Donne Hazel, MD  Insulin Pen Needle (PEN NEEDLES) 32G X 5 MM MISC 1 Device by Does not apply route See admin instructions. For use with insulin pens. Additional refills per PCP 02/11/17  Yes Donne Hazel, MD  losartan (COZAAR) 25 MG tablet Place 1 tablet (25 mg total) into feeding tube daily. 02/11/17  Yes Donne Hazel, MD  magnesium hydroxide (MILK OF MAGNESIA) 400 MG/5ML suspension Place 30 mLs into feeding tube daily as needed  for mild constipation. 01/31/17  Yes David L Rinehuls, PA-C  metoprolol tartrate (LOPRESSOR) 25 MG tablet Place 0.5 tablets (12.5 mg total) into feeding tube 2 (two) times daily. 02/11/17  Yes Donne Hazel, MD  pantoprazole sodium (PROTONIX) 40 mg/20 mL PACK Place 20 mLs (40 mg total) into feeding tube daily. 02/11/17  Yes Donne Hazel, MD  Water For Irrigation, Sterile (FREE WATER) SOLN Place 240 mLs into feeding tube every 6 (six) hours. 01/31/17  Yes David L Rinehuls, PA-C   No Known Allergies Review of Systems  Unable to perform ROS: Patient unresponsive    Physical Exam  Constitutional: She appears lethargic. She appears cachectic. She appears ill.  Cardiovascular: Normal rate, regular rhythm and normal heart sounds.   Pulmonary/Chest: She has decreased breath sounds in the right lower field and the left lower field.  Neurological: She appears lethargic.  Skin: Skin is warm and dry.    Vital Signs: BP (!) 87/44 (BP Location: Left Arm)   Pulse 83   Temp (!) 100.5 F (38.1 C) (Axillary)   Resp (!) 41   Ht 5\' 2"  (1.575 m)   Wt 69.8 kg (153 lb 14.1 oz)   SpO2 100%   BMI 28.15 kg/m  Pain Assessment: PAINAD       SpO2: SpO2: 100 % O2 Device:SpO2: 100 % O2 Flow Rate: .O2 Flow Rate (L/min): 10 L/min  IO: Intake/output  summary:  Intake/Output Summary (Last 24 hours) at 02/18/17 1452 Last data filed at 02/18/17 1015  Gross per 24 hour  Intake              770 ml  Output                0 ml  Net              770 ml    LBM: Last BM Date: 02/17/17 Baseline Weight: Weight: 64.9 kg (143 lb) Most recent weight: Weight: 69.8 kg (153 lb 14.1 oz)      Palliative Assessment/Data: 20%   Flowsheet Rows     Most Recent Value  Intake Tab  Referral Department  Hospitalist  Unit at Time of Referral  ER  Palliative Care Primary Diagnosis  Sepsis/Infectious Disease  Date Notified  02/17/17  Palliative Care Type  Return patient Palliative Care  Reason for referral  Clarify Goals of Care, End of Life Care Assistance  Date of Admission  02/17/17  # of days IP prior to Palliative referral  0  Clinical Assessment  Psychosocial & Spiritual Assessment  Palliative Care Outcomes     Discussed with Dr Eliseo Squires Time In: 1200 Time Out: 1315 Time Total: 75 min Greater than 50%  of this time was spent counseling and coordinating care related to the above assessment and plan.  Signed by: [name], NP   Please contact Palliative Medicine Team phone at [phone] for questions and concerns.  For individual provider: See Shea Evans

## 2017-02-18 NOTE — Progress Notes (Signed)
Noted pt.has not voided since yesterday ;bladder scan done 265 cc noted.B/P=95/49.Called MD on call [place] .Waiting for new orders.Will continue to monitor pt.& will endorse to next shift nurse.

## 2017-02-19 ENCOUNTER — Inpatient Hospital Stay (HOSPITAL_COMMUNITY): Payer: Medicare Other

## 2017-02-19 DIAGNOSIS — Z515 Encounter for palliative care: Secondary | ICD-10-CM

## 2017-02-19 DIAGNOSIS — L89150 Pressure ulcer of sacral region, unstageable: Secondary | ICD-10-CM

## 2017-02-19 DIAGNOSIS — Z66 Do not resuscitate: Secondary | ICD-10-CM

## 2017-02-19 DIAGNOSIS — K567 Ileus, unspecified: Secondary | ICD-10-CM

## 2017-02-19 LAB — CBC
HCT: 22.8 % — ABNORMAL LOW (ref 36.0–46.0)
HCT: 22 % — ABNORMAL LOW (ref 36.0–46.0)
HCT: 23.6 % — ABNORMAL LOW (ref 36.0–46.0)
HEMOGLOBIN: 7.4 g/dL — AB (ref 12.0–15.0)
Hemoglobin: 7.3 g/dL — ABNORMAL LOW (ref 12.0–15.0)
Hemoglobin: 7 g/dL — ABNORMAL LOW (ref 12.0–15.0)
MCH: 28.2 pg (ref 26.0–34.0)
MCH: 28 pg (ref 26.0–34.0)
MCH: 27.4 pg (ref 26.0–34.0)
MCHC: 31.8 g/dL (ref 30.0–36.0)
MCHC: 32 g/dL (ref 30.0–36.0)
MCHC: 31.4 g/dL (ref 30.0–36.0)
MCV: 88 fL (ref 78.0–100.0)
MCV: 88 fL (ref 78.0–100.0)
MCV: 87.4 fL (ref 78.0–100.0)
PLATELETS: 194 10*3/uL (ref 150–400)
PLATELETS: 152 10*3/uL (ref 150–400)
Platelets: 192 10*3/uL (ref 150–400)
RBC: 2.59 MIL/uL — ABNORMAL LOW (ref 3.87–5.11)
RBC: 2.5 MIL/uL — ABNORMAL LOW (ref 3.87–5.11)
RBC: 2.7 MIL/uL — ABNORMAL LOW (ref 3.87–5.11)
RDW: 18.5 % — AB (ref 11.5–15.5)
RDW: 18.9 % — AB (ref 11.5–15.5)
RDW: 18.8 % — AB (ref 11.5–15.5)
WBC: 14.4 10*3/uL — AB (ref 4.0–10.5)
WBC: 13.7 10*3/uL — ABNORMAL HIGH (ref 4.0–10.5)
WBC: 13.7 10*3/uL — AB (ref 4.0–10.5)

## 2017-02-19 LAB — BASIC METABOLIC PANEL
ANION GAP: 13 (ref 5–15)
BUN: 55 mg/dL — ABNORMAL HIGH (ref 6–20)
CALCIUM: 8.1 mg/dL — AB (ref 8.9–10.3)
CO2: 20 mmol/L — AB (ref 22–32)
CREATININE: 1.94 mg/dL — AB (ref 0.44–1.00)
Chloride: 104 mmol/L (ref 101–111)
GFR, EST AFRICAN AMERICAN: 28 mL/min — AB (ref >60)
GFR, EST NON AFRICAN AMERICAN: 24 mL/min — AB (ref >60)
Glucose, Bld: 159 mg/dL — ABNORMAL HIGH (ref 65–99)
Potassium: 5.5 mmol/L — ABNORMAL HIGH (ref 3.5–5.1)
SODIUM: 137 mmol/L (ref 135–145)

## 2017-02-19 LAB — GLUCOSE, CAPILLARY
GLUCOSE-CAPILLARY: 158 mg/dL — AB (ref 65–99)
GLUCOSE-CAPILLARY: 51 mg/dL — AB (ref 65–99)
Glucose-Capillary: 129 mg/dL — ABNORMAL HIGH (ref 65–99)

## 2017-02-19 LAB — HEPARIN LEVEL (UNFRACTIONATED)
HEPARIN UNFRACTIONATED: 0.26 [IU]/mL — AB (ref 0.30–0.70)
Heparin Unfractionated: 0.14 IU/mL — ABNORMAL LOW (ref 0.30–0.70)
Heparin Unfractionated: 0.18 IU/mL — ABNORMAL LOW (ref 0.30–0.70)

## 2017-02-19 MED ORDER — DEXTROSE 50 % IV SOLN
25.0000 mL | Freq: Once | INTRAVENOUS | Status: AC
  Administered 2017-02-19: 25 mL via INTRAVENOUS

## 2017-02-19 MED ORDER — BISACODYL 10 MG RE SUPP
10.0000 mg | Freq: Once | RECTAL | Status: AC
  Administered 2017-02-19: 10 mg via RECTAL
  Filled 2017-02-19: qty 1

## 2017-02-19 MED ORDER — DEXTROSE 50 % IV SOLN
INTRAVENOUS | Status: AC
  Filled 2017-02-19: qty 50

## 2017-02-19 NOTE — Progress Notes (Signed)
ANTICOAGULATION CONSULT NOTE - Follow Up Consult  Pharmacy Consult for heparin Indication: DVT  No Known Allergies  Labs:  Recent Labs  02/17/17 1621 02/18/17 1246 02/19/17 0027 02/19/17 1002 02/19/17 2004  HGB  --   --  7.3* 7.0* 7.4*  HCT  --   --  22.8* 22.0* 23.6*  PLT  --   --  152 194 192  APTT 34  --   --   --   --   LABPROT 17.2*  --   --   --   --   INR 1.39  --   --   --   --   HEPARINUNFRC  --   --  0.26* 0.14* 0.18*  CREATININE 1.39* 1.90* 1.94*  --   --   CKTOTAL 43  --   --   --   --     Estimated Creatinine Clearance: 23.7 mL/min (A) (by C-G formula based on SCr of 1.94 mg/dL (H)).  Assessment: 76yo female with new RLE DVT.  Heparin level improved but remains low and Hg is stable.  No bleeding noted, and pump running at correct rate with no IV issues per d/w RN.   Goal of Therapy:  Heparin level 0.3-0.7 units/ml Monitor platelets by anticoagulation protocol: Yes   Plan:  Increase heparin to 1450 units/hr Repeat heparin level with AM labs Watch for s/s of bleeding  April Franklin, [facility]

## 2017-02-19 NOTE — Progress Notes (Signed)
ANTICOAGULATION & ANTIBIOTIC CONSULT NOTE - Follow-Up Pharmacy Consult for Heparin & Vancomycin + Zosyn Indication: DVT and r/o sepsis  No Known Allergies  Patient Measurements: Height: 5\' 2"  (157.5 cm) Weight: 159 lb 13.3 oz (72.5 kg) IBW/kg (Calculated) : 50.1 Heparin Dosing Weight: 64 kg  Vital Signs: Temp: 99 F (37.2 C) (03/28 1100) Temp Source: Axillary (03/28 1100) BP: 85/48 (03/28 1100) Pulse Rate: 93 (03/28 1100)  Labs:  Recent Labs  02/17/17 1012 02/17/17 1621 02/18/17 1246 02/19/17 0027 02/19/17 1002  HGB 10.3*  --   --  7.3* 7.0*  HCT 33.0*  --   --  22.8* 22.0*  PLT 141*  --   --  152 194  APTT  --  34  --   --   --   LABPROT  --  17.2*  --   --   --   INR  --  1.39  --   --   --   HEPARINUNFRC  --   --   --  0.26* 0.14*  CREATININE 1.24* 1.39* 1.90* 1.94*  --   CKTOTAL  --  43  --   --   --     Estimated Creatinine Clearance: 23.7 mL/min (A) (by C-G formula based on SCr of 1.94 mg/dL (H)).   Assessment: 53 YOF with metastatic colon CA who presented on 3/26 with fever and SIRS concerning for sepsis. The patient was also noted to have a new RLE DVT. Pharmacy was consulted for empiric Vancomycin + Zosyn for r/o sepsis and Heparin for anticoagulation with DVT.  Heparin level this morning remains SUBtherapeutic despite a rate increase (HL 0.14 << 0.26, goal of 0.3-0.7). Initial AM CBC shows drop in Hgb to 7.3 << 10.3 - per MD thought to be dilutional as no active bleeding noted at this time. Repeat CBC shows Hgb stable at 7 - MD checking q12h CBC. Will monitor closely and increase heparin drip rate to obtain target goal.   The patient continues on Vancomycin + Zosyn for r/o sepsis with a bump in SCr this morning. SCr 1.94 << 1.39, CrCl~20-25 ml/min. The Vancomycin dose needs to be reduced however since the dose was already given this morning - will wait and follow-up with AM labs and a trough level on 3/29 to better address the dose adjustment. Will hold the  dose until this level results.   Goal of Therapy:  Heparin level 0.3-0.7 units/ml Monitor platelets by anticoagulation protocol: Yes  Vancomycin trough of 15-20 mcg/ml   Plan:  1. Increase Heparin drip to 1300 units/hr (13 ml/hr) 2. Hold Vancomycin 3. Will obtain a Vancomycin trough on 3/29 and re-enter the doses from there 4. Continue Zosyn 3.375 g IV every 8 hours (infused over 4 hours) 5. Will continue to monitor for any signs/symptoms of bleeding and will follow up with heparin level in 8 hours  6. Will continue to follow renal function, culture results, LOT, and antibiotic de-escalation plans   Thank you for allowing pharmacy to be a part of this patient's care.  Alycia Rossetti, PharmD, BCPS Clinical Pharmacist Pager: [phone] Clinical phone for 02/19/2017 from 7a-3:30p: [phone] If after 3:30p, please call main pharmacy at: [phone] 02/19/2017 11:45 AM

## 2017-02-19 NOTE — Progress Notes (Signed)
  This NP visited patient at the bedside as a follow up to  yesterday's [place].  One daughter at the bedside who when I offered support in answering questions or concerns she told me, "everything is good right now", she really did not engage with me, there was no eye contact.    I encouraged her to call with questions or concerns and offered her and her family our assistance however they may need [place].    No charge  [name] NP  Palliative Medicine Team Team Phone # [phone] Pager [phone]  Patient ID: April Franklin, female   DOB: [birth year], 76 y.o.   MRN: [id number]

## 2017-02-19 NOTE — Care Management Note (Addendum)
Case Management Note  Patient Details  Name: April Franklin MRN: [id number] Date of Birth: 21-Jun-1941  Subjective/Objective:   From home with family, active with [facility]   Presents with N/V of possible fecal material.  Xray shows ileus vs early obstruction, Has Lower ext DVT, Fevers of unknown origin, SIRS, Colon CA, Tracheostomy, , AKI,  prognosis is poor, Palliative has met with family, they wish to pursue txt of what is treatable. NCM will cont to follow for dc needs.    4/4 Tomi Bamberger RN, BSN - conts with sepsis and third spacing, leukocytosis, aki ,afib, acute resp failure (has trach), acute encephalopathy, stage 4 cancer with mets to liver, IR replaced peg tube. conts on heparin drip and amiodarone drip.  Per MD note, patient is dying but   Family cont to want to pursue txt of what is treatable.  NCM will cont to follow for dc needs.    4/10 Tomi Bamberger RN, BSN - NCM and [name] the DON  spoke with [name] , patient's daughter over the phone, she states she will be here tomorrow .  They plan to take patient home with [facility], and we will need an order for a low air loss mattress for patient. They woiuld like to continue with [facility], whom they are still active with.   4/11 [place], BSN - patient is for dc today home with [place] with [facility] notified.  NCM also spoke with [name] about Palliative care following patient at home and explain that she can still have this while [facility] is there as well.  She states they would like that, NCM made referral to Palliative with [facility]).   NCM confirmed address with family [place] [address].  [facility] called for transport.              Action/Plan:   Expected Discharge Date:                  Expected Discharge Plan:  [place]  In-House Referral:     Discharge planning Services  CM Consult  Post Acute Care Choice:  Resumption of Svcs/PTA Provider Choice offered to:     DME Arranged:     DME Agency:     HH Arranged:  RN, PT, OT, Nurse's Aide, Speech Therapy, Social Work [employer] Agency:  [place]  Status of Service:  In process, will continue to follow  If discussed at Long Length of Stay Meetings, dates discussed:    Additional Comments:  Zenon Mayo, RN 02/19/2017, 5:23 PM

## 2017-02-19 NOTE — Progress Notes (Addendum)
PROGRESS NOTE    April Franklin  WLN:[id number] DOB: [birth year] DOA: 02/17/2017 PCP: [name], MD   Outpatient Specialists:     Brief Narrative:  April Franklin is a 76 y.o. female who presents with N/V of possible fecal material.  X ray shows ileus vs early obstruction.  Patient also has has propagation of lower extremity DVT.  Fevers of unknown origin on broad spectrum abx.  Of note pt overall prognosis very poor and family has vascilated w/ regards to her overall GOC. Currently pt is DNR.  Palliative care has met with family again and family wishes to pursue treatment of treatable conditions.  There are multiple family members involved in patient's care.     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 Patient is febrile with tachycardia and tachypnea, leukocytosis, elevated lactic acid and acute kidney injury, ams  -resume IV fluids at low dose -Continue broad-spectrum antibiotics -Follow blood cultures -lactic acid elevated- ? Infection vs ileus -Monitor urine output- limited amount thus far -Palliative care consult for [place] -patient has an overall poor prognosis  Acute kidney injury. 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 Status post tracheostomy 2 hospitalizations  ago. -anti-tussive -scopolamine -oxygen supplementation as indicated -Antibiotics as noted above  Acute on chronic encephalopathy -infection vs worsening CVA  -Chart review/notes indicate opined very poor prognosis.  -recommend comfort care to family -palliative care consulted -Aspirin stopped last hospitalization   Hypertension. -patient currently hypotensive -holding parameters on BP meds  Diabetes.  -hold lantus -SSI  Leukocytosis -decreasing  Lower extremity edema- R>L -DVT 2 hospitalizations ago- duplex show worsening DVT- patient is not ambulatory and hypercoagulable from cancer -Anticoagulation stopped last hospitalization due to anemia -heparin restarted but will need to watch Hgb closely- suspect drop today from volume dilution -Synovial fluid aspirated in ED-- culture NGTD-- fluid studies pending   Hyperkalemia -worrisome as not getting any K supplementation  Anemia -suspect from Hgb from volume dilution -CBC q 12 hours  Recent NSTEMI - no echo done since increased troponin -no intervention possible due to patient's poor prognosis/current state  Sacral Pressure Injury 02/17/17 appears unstageable per nusing -WOC consult  Attempted discussions with 2 daughters regarding poor prognosis and new worrisome signs of ileus/obstruction, low Hbg, and increasing K and Cr.  Neither would acknowledge information.  DVT prophylaxis:  SQ Heparin  Code Status: DNR   Family Communication: Daughters at bedside- will not make eye contact or acknowledge new diagnosis    Disposition Plan:     Consultants:   Palliative care     Subjective: Patient not able to converse  Objective: Vitals:   02/19/17 0400 02/19/17 0500 02/19/17 0800 02/19/17 0801  BP: (!) 87/50 (!) 97/45    Pulse: 94 92    Resp: (!) 40 (!) 39    Temp:  100.2 F (37.9 C) 97.5 F (36.4 C)   TempSrc:  Rectal Axillary   SpO2: 94% 94%  96%  Weight:  72.5 kg (159 lb 13.3 oz)      Height:        Intake/Output Summary (Last 24 hours) at 02/19/17 0844 Last data filed at 02/19/17 0500  Gross per 24 hour  Intake          1669.26 ml  Output                0 ml  Net          1669.26 ml   Filed Weights   02/17/17 0925 02/17/17 1542 02/19/17 0500  Weight: 64.9 kg (143 lb) 69.8 kg (153 lb 14.1 oz) 72.5 kg (159 lb 13.3 oz)    Examination:  General exam: ill appearing- trach and PEG in place Respiratory system: diminished, no wheezing Cardiovascular system: rrr Gastrointestinal system: peg in place, no bowel sounds ausculatated Central nervous system: Not able to cooperate with exam Psychiatry: unable to assess Will open eyes but not follow commands, will withdraw to pain but no purposeful movement otherwise    Data Reviewed: I have personally reviewed following labs and imaging studies  CBC:  Recent Labs Lab 02/17/17 1012 02/19/17 0027  WBC 33.5* 13.7*  NEUTROABS 29.9*  --   HGB 10.3* 7.3*  HCT 33.0* 22.8*  MCV 89.7 88.0  PLT 141* 657   Basic Metabolic Panel:  Recent Labs Lab 02/17/17 1012 02/17/17 1621 02/18/17 1246 02/19/17 0027  NA 136  --  137 137  K 4.9  --  5.4* 5.5*  CL 97*  --  103 104  CO2 24  --  22 20*  GLUCOSE 155*  --  158* 159*  BUN 24*  --  48* 55*  CREATININE 1.24* 1.39* 1.90* 1.94*  CALCIUM 9.9  --  8.4* 8.1*   GFR: Estimated Creatinine Clearance: 23.7 mL/min (A) (by C-G formula based on SCr of 1.94 mg/dL (H)). Liver Function Tests:  Recent Labs Lab 02/17/17 1012  AST 89*  ALT 65*  ALKPHOS 533*  BILITOT 1.9*  PROT 6.5  ALBUMIN 2.0*   No results for input(s): LIPASE, AMYLASE in the last 168 hours. No results for input(s): AMMONIA in the last 168 hours. Coagulation Profile:  Recent Labs Lab 02/17/17 1621  INR 1.39   Cardiac Enzymes:  Recent Labs Lab 02/17/17 1621  CKTOTAL 43   BNP (last 3 results) No results for input(s): PROBNP in the last 8760 hours. HbA1C: No results for input(s): HGBA1C in  the last 72 hours. CBG:  Recent Labs Lab 02/18/17 0802 02/18/17 1234 02/18/17 1857 02/18/17 2110 02/19/17 0810  GLUCAP 134* 139* 122* 130* 158*   Lipid Profile: No results for input(s): CHOL, HDL, LDLCALC, TRIG, CHOLHDL, LDLDIRECT in the last 72 hours. Thyroid Function Tests: No results for input(s): TSH, T4TOTAL, FREET4, T3FREE, THYROIDAB in the last 72 hours. Anemia Panel: No results for input(s): VITAMINB12, FOLATE, FERRITIN, TIBC, IRON, RETICCTPCT in the last 72 hours. Urine analysis:    Component Value Date/Time   COLORURINE AMBER (A) 02/17/2017 1315   APPEARANCEUR HAZY (A) 02/17/2017 1315   LABSPEC 1.028 02/17/2017 1315   PHURINE 5.0 02/17/2017 1315   GLUCOSEU 50 (A) 02/17/2017 1315   HGBUR SMALL (A) 02/17/2017 1315   BILIRUBINUR SMALL (A) 02/17/2017 1315   KETONESUR NEGATIVE 02/17/2017 1315   PROTEINUR 30 (A) 02/17/2017 1315   NITRITE NEGATIVE 02/17/2017 1315   LEUKOCYTESUR NEGATIVE  02/17/2017 1315      Recent Results (from the past 240 hour(s))  Blood Culture (routine x 2)     Status: None (Preliminary result)   Collection Time: 02/17/17 10:13 AM  Result Value Ref Range Status   Specimen Description BLOOD LEFT ANTECUBITAL  Final   Special Requests IN PEDIATRIC BOTTLE 4CC  Final   Culture NO GROWTH 1 DAY  Final   Report Status PENDING  Incomplete  Blood Culture (routine x 2)     Status: None (Preliminary result)   Collection Time: 02/17/17 10:15 AM  Result Value Ref Range Status   Specimen Description BLOOD LEFT HAND  Final   Special Requests AEROBIC BOTTLE ONLY 6CC  Final   Culture NO GROWTH 1 DAY  Final   Report Status PENDING  Incomplete  Body fluid culture     Status: None (Preliminary result)   Collection Time: 02/17/17 12:48 PM  Result Value Ref Range Status   Specimen Description SYNOVIAL RIGHT KNEE  Final   Special Requests NONE  Final   Gram Stain   Final    MODERATE WBC PRESENT, PREDOMINANTLY MONONUCLEAR RARE WBC PRESENT, PREDOMINANTLY  PMN NO ORGANISMS SEEN    Culture NO GROWTH < 24 HOURS  Final   Report Status PENDING  Incomplete  MRSA PCR Screening     Status: None   Collection Time: 02/18/17  5:54 PM  Result Value Ref Range Status   MRSA by PCR NEGATIVE NEGATIVE Final    Comment:        The GeneXpert MRSA Assay (FDA approved for NASAL specimens only), is one component of a comprehensive MRSA colonization surveillance program. It is not intended to diagnose MRSA infection nor to guide or monitor treatment for MRSA infections.       Anti-infectives    Start     Dose/Rate Route Frequency Ordered Stop   02/18/17 1200  vancomycin (VANCOCIN) IVPB 1000 mg/200 mL premix     1,000 mg 200 mL/hr over 60 Minutes Intravenous Every 24 hours 02/17/17 1108     02/17/17 1600  piperacillin-tazobactam (ZOSYN) IVPB 3.375 g     3.375 g 12.5 mL/hr over 240 Minutes Intravenous Every 8 hours 02/17/17 1108     02/17/17 1330  piperacillin-tazobactam (ZOSYN) IVPB 3.375 g  Status:  Discontinued     3.375 g 100 mL/hr over 30 Minutes Intravenous  Once 02/17/17 1317 02/17/17 1322   02/17/17 1330  vancomycin (VANCOCIN) IVPB 1000 mg/200 mL premix  Status:  Discontinued     1,000 mg 200 mL/hr over 60 Minutes Intravenous  Once 02/17/17 1317 02/17/17 1322   02/17/17 1000  piperacillin-tazobactam (ZOSYN) IVPB 3.375 g     3.375 g 100 mL/hr over 30 Minutes Intravenous  Once 02/17/17 0945 02/17/17 1038   02/17/17 1000  vancomycin (VANCOCIN) IVPB 1000 mg/200 mL premix  Status:  Discontinued     1,000 mg 200 mL/hr over 60 Minutes Intravenous  Once 02/17/17 0945 02/17/17 0953   02/17/17 1000  vancomycin (VANCOCIN) 1,250 mg in sodium chloride 0.9 % 250 mL IVPB     1,250 mg 166.7 mL/hr over 90 Minutes Intravenous  Once 02/17/17 6754 02/17/17 1306       Radiology Studies: Dg Chest Port 1 View  Result Date: 02/19/2017 CLINICAL DATA:  Sepsis, possible aspiration pneumonia. Patient admitted 02/17/2017. EXAM: PORTABLE CHEST 1 VIEW  COMPARISON:  Single-view of the chest 02/18/2017 and 02/17/2017. FINDINGS: Right basilar atelectasis seen on yesterday's examination has resolved. Small focus of left  basilar airspace disease is unchanged. There is no pneumothorax. Marked cardiomegaly without edema is seen. Tracheostomy tube is in place. IMPRESSION: No change in small focus of left basilar airspace disease. Resolved right basilar atelectasis. Cardiomegaly without edema. Electronically Signed   By: Inge Rise M.D.   On: 02/19/2017 07:56   Dg Chest Port 1 View  Result Date: 02/18/2017 CLINICAL DATA:  Ventilator dependent EXAM: PORTABLE CHEST 1 VIEW COMPARISON:  01/10/2017, 02/17/2017 FINDINGS: Tracheostomy tube in satisfactory position. Mild bilateral interstitial prominence likely secondary to low lung volumes. Mild right basilar atelectasis. No significant pleural effusion or pneumothorax. Stable cardiomegaly. No acute osseous abnormality. IMPRESSION: Cardiomegaly. No active cardiopulmonary disease. Electronically Signed   By: Kathreen Devoid   On: 02/18/2017 16:21   Dg Chest Portable 1 View  Result Date: 02/17/2017 CLINICAL DATA:  Fever and tachypnea. EXAM: PORTABLE CHEST 1 VIEW COMPARISON:  02/04/2017 FINDINGS: Tracheostomy tube tip is above the carina. The heart size is mildly enlarged. There is mild interstitial edema. No airspace opacities. IMPRESSION: 1. Cardiac enlargement and mild edema. Electronically Signed   By: Kerby Moors M.D.   On: 02/17/2017 10:53   Dg Abd Portable 1v  Result Date: 02/18/2017 CLINICAL DATA:  Hypoactive EXAM: PORTABLE ABDOMEN - 1 VIEW COMPARISON:  None. FINDINGS: Gaseous distended small bowel loops are noted mid lower abdomen suspicious for ileus or early bowel obstruction. IMPRESSION: Gaseous distended small bowel loops are noted mid lower abdomen suspicious for ileus or early bowel obstruction. Electronically Signed   By: Lahoma Crocker M.D.   On: 02/18/2017 20:22        Scheduled Meds: .  acetaminophen  650 mg Per Tube Once  . bisacodyl  10 mg Rectal Once  . docusate  50 mg Per Tube QHS  . free water  240 mL Per Tube Q6H  . insulin aspart  0-5 Units Subcutaneous QHS  . insulin aspart  0-9 Units Subcutaneous TID WC  . mouth rinse  15 mL Mouth Rinse q12n4p  . metoprolol tartrate  12.5 mg Per Tube BID  . pantoprazole sodium  40 mg Per Tube Daily  . piperacillin-tazobactam (ZOSYN)  IV  3.375 g Intravenous Q8H  . scopolamine  1 patch Transdermal Q72H  . sodium chloride flush  3 mL Intravenous Q12H  . vancomycin  1,000 mg Intravenous Q24H   Continuous Infusions: . sodium chloride 50 mL/hr at 02/18/17 1900  . heparin 1,100 Units/hr (02/19/17 0500)     LOS: 2 days    Time spent: 35 min    [place], DO [facility] Pager [phone]  If 7PM-7AM, please contact night-coverage [email or web address] Password [facility] 02/19/2017, 8:44 AM

## 2017-02-19 NOTE — Progress Notes (Signed)
ANTICOAGULATION CONSULT NOTE - Follow Up Consult  Pharmacy Consult for Heparin  Indication: DVT  No Known Allergies  Patient Measurements: Height: 5\' 2"  (157.5 cm) Weight: 153 lb 14.1 oz (69.8 kg) IBW/kg (Calculated) : 50.1  Vital Signs: Temp: 98.9 F (37.2 C) (03/27 2340) Temp Source: Axillary (03/27 2340) BP: 95/50 (03/27 2340) Pulse Rate: 98 (03/27 2340)  Labs:  Recent Labs  02/17/17 1012 02/17/17 1621 02/18/17 1246 02/19/17 0027  HGB 10.3*  --   --  7.3*  HCT 33.0*  --   --  22.8*  PLT 141*  --   --  152  APTT  --  34  --   --   LABPROT  --  17.2*  --   --   INR  --  1.39  --   --   HEPARINUNFRC  --   --   --  0.26*  CREATININE 1.24* 1.39* 1.90* 1.94*  CKTOTAL  --  43  --   --     Estimated Creatinine Clearance: 23.3 mL/min (A) (by C-G formula based on SCr of 1.94 mg/dL (H)).  Assessment: Heparin for new onset DVT in setting of metastatic CA, initial heparin level low at 0.26, noted that Hgb dropped from 10.3>>7.3, no obvious bleeding per RN.   Goal of Therapy:  Heparin level 0.3-0.7 units/ml Monitor platelets by anticoagulation protocol: Yes   Plan:  -Inc heparin to 1100 units/hr -1000 HL/CBC -Monitor Hgb closely   Narda Bonds 02/19/2017,1:25 AM

## 2017-02-20 ENCOUNTER — Inpatient Hospital Stay (HOSPITAL_COMMUNITY): Payer: Medicare Other

## 2017-02-20 DIAGNOSIS — R0902 Hypoxemia: Secondary | ICD-10-CM

## 2017-02-20 LAB — BASIC METABOLIC PANEL
Anion gap: 15 (ref 5–15)
BUN: 65 mg/dL — ABNORMAL HIGH (ref 6–20)
CALCIUM: 7.6 mg/dL — AB (ref 8.9–10.3)
CO2: 18 mmol/L — AB (ref 22–32)
CREATININE: 2.04 mg/dL — AB (ref 0.44–1.00)
Chloride: 105 mmol/L (ref 101–111)
GFR calc Af Amer: 26 mL/min — ABNORMAL LOW (ref >60)
GFR calc non Af Amer: 23 mL/min — ABNORMAL LOW (ref >60)
GLUCOSE: 172 mg/dL — AB (ref 65–99)
Potassium: 5.1 mmol/L (ref 3.5–5.1)
Sodium: 138 mmol/L (ref 135–145)

## 2017-02-20 LAB — CBC
HCT: 25.4 % — ABNORMAL LOW (ref 36.0–46.0)
HEMATOCRIT: 22.9 % — AB (ref 36.0–46.0)
Hemoglobin: 8 g/dL — ABNORMAL LOW (ref 12.0–15.0)
Hemoglobin: 7.3 g/dL — ABNORMAL LOW (ref 12.0–15.0)
MCH: 27.7 pg (ref 26.0–34.0)
MCH: 27.8 pg (ref 26.0–34.0)
MCHC: 31.5 g/dL (ref 30.0–36.0)
MCHC: 31.9 g/dL (ref 30.0–36.0)
MCV: 87.9 fL (ref 78.0–100.0)
MCV: 87.1 fL (ref 78.0–100.0)
PLATELETS: 236 10*3/uL (ref 150–400)
Platelets: 245 10*3/uL (ref 150–400)
RBC: 2.89 MIL/uL — ABNORMAL LOW (ref 3.87–5.11)
RBC: 2.63 MIL/uL — ABNORMAL LOW (ref 3.87–5.11)
RDW: 19.3 % — AB (ref 11.5–15.5)
RDW: 19 % — AB (ref 11.5–15.5)
WBC: 16.8 10*3/uL — ABNORMAL HIGH (ref 4.0–10.5)
WBC: 19.2 10*3/uL — ABNORMAL HIGH (ref 4.0–10.5)

## 2017-02-20 LAB — GLUCOSE, CAPILLARY
GLUCOSE-CAPILLARY: 153 mg/dL — AB (ref 65–99)
Glucose-Capillary: 111 mg/dL — ABNORMAL HIGH (ref 65–99)
Glucose-Capillary: 151 mg/dL — ABNORMAL HIGH (ref 65–99)
Glucose-Capillary: 149 mg/dL — ABNORMAL HIGH (ref 65–99)
Glucose-Capillary: 135 mg/dL — ABNORMAL HIGH (ref 65–99)

## 2017-02-20 LAB — HEPARIN LEVEL (UNFRACTIONATED)
Heparin Unfractionated: 0.28 IU/mL — ABNORMAL LOW (ref 0.30–0.70)
Heparin Unfractionated: 0.23 IU/mL — ABNORMAL LOW (ref 0.30–0.70)

## 2017-02-20 LAB — VANCOMYCIN, RANDOM: VANCOMYCIN RM: 30

## 2017-02-20 MED ORDER — PANTOPRAZOLE SODIUM 40 MG IV SOLR
40.0000 mg | INTRAVENOUS | Status: DC
  Administered 2017-02-20 – 2017-03-05 (×14): 40 mg via INTRAVENOUS
  Filled 2017-02-20 (×14): qty 40

## 2017-02-20 MED ORDER — CHLORHEXIDINE GLUCONATE 0.12 % MT SOLN
15.0000 mL | Freq: Two times a day (BID) | OROMUCOSAL | Status: DC
  Administered 2017-02-20 – 2017-03-05 (×25): 15 mL via OROMUCOSAL
  Filled 2017-02-20 (×24): qty 15

## 2017-02-20 MED ORDER — BISACODYL 10 MG RE SUPP
10.0000 mg | Freq: Every day | RECTAL | Status: DC
  Administered 2017-02-20 – 2017-02-21 (×2): 10 mg via RECTAL
  Filled 2017-02-20 (×2): qty 1

## 2017-02-20 MED ORDER — INSULIN ASPART 100 UNIT/ML ~~LOC~~ SOLN
0.0000 [IU] | SUBCUTANEOUS | Status: DC
  Administered 2017-02-20 (×2): 1 [IU] via SUBCUTANEOUS
  Administered 2017-02-21 (×2): 2 [IU] via SUBCUTANEOUS
  Administered 2017-02-21: 1 [IU] via SUBCUTANEOUS
  Administered 2017-02-21 – 2017-02-22 (×9): 2 [IU] via SUBCUTANEOUS
  Administered 2017-02-23: 3 [IU] via SUBCUTANEOUS
  Administered 2017-02-23 (×2): 2 [IU] via SUBCUTANEOUS
  Administered 2017-02-23: 3 [IU] via SUBCUTANEOUS
  Administered 2017-02-23: 2 [IU] via SUBCUTANEOUS
  Administered 2017-02-23: 3 [IU] via SUBCUTANEOUS
  Administered 2017-02-24: 2 [IU] via SUBCUTANEOUS
  Administered 2017-02-24 (×2): 3 [IU] via SUBCUTANEOUS
  Administered 2017-02-24: 2 [IU] via SUBCUTANEOUS
  Administered 2017-02-24 (×2): 3 [IU] via SUBCUTANEOUS
  Administered 2017-02-24 – 2017-02-25 (×5): 5 [IU] via SUBCUTANEOUS
  Administered 2017-02-26: 3 [IU] via SUBCUTANEOUS
  Administered 2017-02-26: 7 [IU] via SUBCUTANEOUS
  Administered 2017-02-26 (×2): 3 [IU] via SUBCUTANEOUS
  Administered 2017-02-26: 7 [IU] via SUBCUTANEOUS
  Administered 2017-02-27: 3 [IU] via SUBCUTANEOUS
  Administered 2017-02-27 (×2): 5 [IU] via SUBCUTANEOUS
  Administered 2017-02-27 – 2017-02-28 (×4): 3 [IU] via SUBCUTANEOUS
  Administered 2017-02-28: 2 [IU] via SUBCUTANEOUS
  Administered 2017-02-28: 3 [IU] via SUBCUTANEOUS
  Administered 2017-03-01 – 2017-03-02 (×3): 2 [IU] via SUBCUTANEOUS
  Administered 2017-03-02: 1 [IU] via SUBCUTANEOUS
  Administered 2017-03-02 (×2): 2 [IU] via SUBCUTANEOUS
  Administered 2017-03-02 – 2017-03-04 (×5): 1 [IU] via SUBCUTANEOUS

## 2017-02-20 NOTE — Progress Notes (Signed)
ANTICOAGULATION & ANTIBIOTIC CONSULT NOTE - Follow-Up Pharmacy Consult for Heparin Indication: DVT  No Known Allergies  Patient Measurements: Height: 5\' 2"  (157.5 cm) Weight: 161 lb 2.5 oz (73.1 kg) IBW/kg (Calculated) : 50.1 Heparin Dosing Weight: 64 kg  Vital Signs: Temp: 99.5 F (37.5 C) (03/29 0700) Temp Source: Axillary (03/29 0700) BP: 85/46 (03/29 0825) Pulse Rate: 88 (03/29 0825)  Labs:  Recent Labs  02/17/17 1621 02/18/17 1246  02/19/17 0027 02/19/17 1002 02/19/17 2004 02/20/17 0711  HGB  --   --   < > 7.3* 7.0* 7.4*  --   HCT  --   --   --  22.8* 22.0* 23.6*  --   PLT  --   --   --  152 194 192  --   APTT 34  --   --   --   --   --   --   LABPROT 17.2*  --   --   --   --   --   --   INR 1.39  --   --   --   --   --   --   HEPARINUNFRC  --   --   < > 0.26* 0.14* 0.18* 0.28*  CREATININE 1.39* 1.90*  --  1.94*  --   --  2.04*  CKTOTAL 43  --   --   --   --   --   --   < > = values in this interval not displayed.  Estimated Creatinine Clearance: 22.6 mL/min (A) (by C-G formula based on SCr of 2.04 mg/dL (H)).   Assessment: 32 YOF with metastatic colon CA who presented on 3/26 with fever and SIRS concerning for sepsis. The patient was also noted to have a new RLE DVT. Marland Kitchen  Heparin level this morning remains SUBtherapeutic despite a rate increase but is trending up (HL 0.28 << 0.18, goal of 0.3-0.7). CBC checks show Hgb stable and per RN report - no bleeding noted at this time. Will continue to monitor.   Goal of Therapy:  Heparin level 0.3-0.7 units/ml Monitor platelets by anticoagulation protocol: Yes    Plan:  1. Increase Heparin drip to 1550 units/hr (15.5 ml/hr) 2. Will continue to monitor for any signs/symptoms of bleeding and will follow up with heparin level in 8 hours   Thank you for allowing pharmacy to be a part of this patient's care.  Alycia Rossetti, PharmD, BCPS Clinical Pharmacist Pager: [phone] Clinical phone for 02/20/2017 from  7a-3:30p: [phone] If after 3:30p, please call main pharmacy at: [phone] 02/20/2017 10:36 AM

## 2017-02-20 NOTE — Progress Notes (Signed)
Patient ID: Rockie Neighbours, female   DOB: 1941-06-20, 76 y.o.   MRN: [id number]   Again this NP stopped by the room to offer support to the family, daughter at bedside, very little verbal engagement and minimal eye contact,  no needs verbalized at this time.  I encouraged them to call with questions or concerns.  [name] NP  Palliative Medicine Team Team Phone # [phone] Pager [phone]   No charge

## 2017-02-20 NOTE — Progress Notes (Signed)
ANTICOAGULATION & ANTIBIOTIC CONSULT NOTE - Follow-Up Pharmacy Consult for Heparin Indication: DVT  No Known Allergies  Patient Measurements: Height: 5\' 2"  (157.5 cm) Weight: 161 lb 2.5 oz (73.1 kg) IBW/kg (Calculated) : 50.1 Heparin Dosing Weight: 64 kg  Vital Signs: Temp: 99.5 F (37.5 C) (03/29 1200) Temp Source: Axillary (03/29 1200) BP: 120/44 (03/29 1700) Pulse Rate: 69 (03/29 1737)  Labs:  Recent Labs  02/18/17 1246  02/19/17 0027 02/19/17 1002 02/19/17 2004 02/20/17 0711 02/20/17 1020 02/20/17 1714  HGB  --   < > 7.3* 7.0* 7.4*  --  8.0*  --   HCT  --   < > 22.8* 22.0* 23.6*  --  25.4*  --   PLT  --   < > 152 194 192  --  236  --   HEPARINUNFRC  --   < > 0.26* 0.14* 0.18* 0.28*  --  0.23*  CREATININE 1.90*  --  1.94*  --   --  2.04*  --   --   < > = values in this interval not displayed.  Estimated Creatinine Clearance: 22.6 mL/min (A) (by C-G formula based on SCr of 2.04 mg/dL (H)).   Assessment: 89 YOF with metastatic colon CA who presented on 3/26 with fever and SIRS concerning for sepsis. The patient was also noted to have a new RLE DVT. Marland Kitchen  Heparin level this evening remains SUBtherapeutic despite a rate increase.. CBC checks show Hgb stable and per RN report - no bleeding noted at this time. Will continue to monitor.   Goal of Therapy:  Heparin level 0.3-0.7 units/ml Monitor platelets by anticoagulation protocol: Yes    Plan:  1. Increase Heparin drip to 1750 units/hr (17.5 ml/hr) 2. Will continue to monitor for any signs/symptoms of bleeding and will follow up with heparin level in 8 hours   Thank you for allowing pharmacy to be a part of this patient's care.  Erin Hearing PharmD., BCPS Clinical Pharmacist Pager [phone] 02/20/2017 7:23 PM

## 2017-02-20 NOTE — Progress Notes (Signed)
Pharmacy Antibiotic Note  April Franklin is a 76 y.o. female admitted on 02/17/2017 with with fever and SIRS concerning for sepsis.Pharmacy has been consulted for Vancomycin + Zosyn dosing.  The patient's SCr was noted to bump since admission and is currently at 2.04 << 1.94, CrCl~20-25 ml/min. A Vancomycin level drawn ~2h early from a true trough was SUPRAtherapeutic at 30 mcg/ml >> corrects to 28.5 mcg/ml. Discussed necessity of continuing with Dr. Eliseo Squires and plans are to discontinue at this time and to continue Zosyn monotherapy for empiric coverage.   Plan: 1. D/c Vancomycin 2. Continue Zosyn 3.375g IV every 8 hours (infused over 4 hours) 3. Will continue to follow renal function, culture results, LOT, and antibiotic de-escalation plans   Height: 5\' 2"  (157.5 cm) Weight: 161 lb 2.5 oz (73.1 kg) IBW/kg (Calculated) : 50.1  Temp (24hrs), Avg:99.4 F (37.4 C), Min:99 F (37.2 C), Max:99.7 F (37.6 C)   Recent Labs Lab 02/17/17 1012 02/17/17 1026 02/17/17 1621 02/18/17 1246 02/19/17 0027 02/19/17 1002 02/19/17 2004 02/20/17 0711 02/20/17 1020  WBC 33.5*  --   --   --  13.7* 13.7* 14.4*  --  16.8*  CREATININE 1.24*  --  1.39* 1.90* 1.94*  --   --  2.04*  --   LATICACIDVEN  --  4.59* 3.5*  --   --   --   --   --   --   VANCORANDOM  --   --   --   --   --   --   --   --  30    Estimated Creatinine Clearance: 22.6 mL/min (A) (by C-G formula based on SCr of 2.04 mg/dL (H)).    No Known Allergies  Antimicrobials this admission: Zosyn 3/26 >>  Vancomycin 3/26 >>   Dose adjustments this admission: 3/29 VR: 30 mcg/ml   Microbiology results: 3/26 BCx >> ngtd 3/27 MRSA PCR >> neg 3/26 R-knee fluid >> ngtd  Thank you for allowing pharmacy to be a part of this patient's care.  Alycia Rossetti, PharmD, BCPS Clinical Pharmacist Pager: [phone] Clinical phone for 02/20/2017 from 7a-3:30p: [phone] If after 3:30p, please call main pharmacy at: [phone] 02/20/2017 1:04 PM

## 2017-02-20 NOTE — Consult Note (Signed)
[place] Nurse wound consult note Reason for Consult: SDTI sacrum Wound type: SDTI and Stage II Pressure Injury POA: Yes Measurement: 5cm x 8cm across sacrum shaped like butterfly, left buttock area is a stage II, Right buttock area is a suspected DTI. Wound bed: Stage II has pink wound bed Drainage (amount, consistency, odor) no odor or drainage Periwound:excoriated in between buttocks Dressing procedure/placement/frequency: I have provided nurses with orders for Sacral Allevyn. She is already on a low air loss mattress.  I have placed on order for pt to have specialty mattress when transfers to med-surg floor.  We will not follow, but will remain available to this patient, to nursing, and the medical and/or surgical teams.  Please re-consult if we need to assist further.    Fara Olden, RN-C, WTA-C Wound Treatment Associate

## 2017-02-20 NOTE — Progress Notes (Signed)
PROGRESS NOTE    April Franklin  MPN:[id number] DOB: [birth year] DOA: 02/17/2017 PCP: [name], MD   Outpatient Specialists:     Brief Narrative:  April Franklin is a 76 y.o. female who presents with N/V of possible fecal material.  X ray shows ileus vs early obstruction.  Patient also has has propagation of lower extremity DVT.  Fevers of unknown origin on broad spectrum abx.  Of note pt overall prognosis very poor and family has vascilated w/ regards to her overall GOC. Currently pt is DNR.  Palliative care has met with family again and family wishes to pursue treatment of treatable conditions.  There are multiple family members involved in patient's care.     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 Patient is febrile with tachycardia and tachypnea, leukocytosis, elevated lactic acid and acute kidney injury, ams  -resume IV fluids at low dose -narrow abx to zosyn -Follow blood cultures but NGTD -lactic acid elevated- ? Infection vs ileus -Monitor urine output -Palliative care consult for [place] -patient has an overall poor prognosis and family not accepting of this-- multiple members involved in care  Acute kidney injury. 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 Status post tracheostomy 2 hospitalizations ago. -anti-tussive -scopolamine d/c as may be contributing to ileus -oxygen supplementation as indicated -Antibiotics as noted above  Acute on chronic encephalopathy -infection vs worsening CVA  -Chart review/notes indicate opined very poor prognosis.  -recommend comfort care to family but they continue to hold on for a "miracle" -palliative care consulted -Aspirin stopped last hospitalization   Hypertension. -patient currently hypotensive -holding parameters on BP meds  Diabetes.  -hold lantus -SSI  Leukocytosis -trend  Lower extremity edema- R>L -DVT 2 hospitalizations ago- duplex show worsening DVT- patient is not ambulatory and hypercoagulable from cancer -Anticoagulation stopped last hospitalization due to anemia -heparin restarted but will need to watch Hgb closely- -Synovial fluid aspirated in ED-- culture NGTD-- fluid studies NEVER sent in ER  Hyperkalemia -improved with BM  Anemia -suspect from Hgb from volume dilution -CBC q 12 hours  Recent NSTEMI - no echo done since incident -no intervention possible due to patient's poor prognosis/current state  Sacral Pressure Injury 02/17/17 appears unstageable per nusing -WOC consult   DVT prophylaxis:  SQ Heparin  Code Status: DNR   Family Communication: Updated grandson at bedside  Disposition Plan:     Consultants:   Palliative care     Subjective: Large BM but no change in patient's ability to communicate  Objective: Vitals:   02/20/17 0700 02/20/17 0825 02/20/17 1200 02/20/17 1241  BP:  (!) 85/46  (!) 104/53  Pulse:  88  80  Resp:  (!) 44  (!) 45  Temp: 99.5 F (37.5 C)  99.5 F (37.5 C)   TempSrc: Axillary  Axillary   SpO2:  96%  94%  Weight:      Height:        Intake/Output  Summary (Last 24 hours) at 02/20/17 1416 Last data filed at 02/20/17 1222  Gross per 24 hour  Intake          1747.41 ml  Output              705 ml  Net          1042.41 ml   Filed Weights   02/17/17 1542 02/19/17 0500 02/20/17 0500  Weight: 69.8 kg (153 lb 14.1 oz) 72.5 kg (159 lb 13.3 oz) 73.1 kg (161 lb 2.5 oz)    Examination:  General exam: ill appearing- trach and PEG in place Respiratory system: diminished anterior, unable to ausculte back Cardiovascular system: reg Gastrointestinal system: peg in place, no bowel sounds ausculatated Central nervous system: Not able to cooperate with exam Psychiatry: unable to assess Not opening eyes to voice, will withdraw to pain, pupils appear slightly un-even    Data Reviewed: I have personally reviewed following labs and imaging studies  CBC:  Recent Labs Lab 02/17/17 1012 02/19/17 0027 02/19/17 1002 02/19/17 2004 02/20/17 1020  WBC 33.5* 13.7* 13.7* 14.4* 16.8*  NEUTROABS 29.9*  --   --   --   --   HGB 10.3* 7.3* 7.0* 7.4* 8.0*  HCT 33.0* 22.8* 22.0* 23.6* 25.4*  MCV 89.7 88.0 88.0 87.4 87.9  PLT 141* 152 194 192 790   Basic Metabolic Panel:  Recent Labs Lab 02/17/17 1012 02/17/17 1621 02/18/17 1246 02/19/17 0027 02/20/17 0711  NA 136  --  137 137 138  K 4.9  --  5.4* 5.5* 5.1  CL 97*  --  103 104 105  CO2 24  --  22 20* 18*  GLUCOSE 155*  --  158* 159* 172*  BUN 24*  --  48* 55* 65*  CREATININE 1.24* 1.39* 1.90* 1.94* 2.04*  CALCIUM 9.9  --  8.4* 8.1* 7.6*   GFR: Estimated Creatinine Clearance: 22.6 mL/min (A) (by C-G formula based on SCr of 2.04 mg/dL (H)). Liver Function Tests:  Recent Labs Lab 02/17/17 1012  AST 89*  ALT 65*  ALKPHOS 533*  BILITOT 1.9*  PROT 6.5  ALBUMIN 2.0*   No results for input(s): LIPASE, AMYLASE in the last 168 hours. No results for input(s): AMMONIA in the last 168 hours. Coagulation Profile:  Recent Labs Lab 02/17/17 1621  INR 1.39   Cardiac Enzymes:  Recent  Labs Lab 02/17/17 1621  CKTOTAL 43   BNP (last 3 results) No results for input(s): PROBNP in the last 8760 hours. HbA1C: No results for input(s): HGBA1C in the last 72 hours. CBG:  Recent Labs Lab 02/19/17 1107 02/19/17 1712 02/19/17 2105 02/20/17 0750 02/20/17 1221  GLUCAP 129* 51* 111* 153* 151*   Lipid Profile: No results for input(s): CHOL, HDL, LDLCALC, TRIG, CHOLHDL, LDLDIRECT in the last 72 hours. Thyroid Function Tests: No results for input(s): TSH, T4TOTAL, FREET4, T3FREE, THYROIDAB in the last 72 hours. Anemia Panel: No results for input(s): VITAMINB12, FOLATE, FERRITIN, TIBC, IRON, RETICCTPCT in the last 72 hours. Urine analysis:    Component Value Date/Time   COLORURINE AMBER (A) 02/17/2017 1315   APPEARANCEUR HAZY (A) 02/17/2017 1315   LABSPEC 1.028 02/17/2017 1315  PHURINE 5.0 02/17/2017 1315   GLUCOSEU 50 (A) 02/17/2017 1315   HGBUR SMALL (A) 02/17/2017 1315   BILIRUBINUR SMALL (A) 02/17/2017 1315   KETONESUR NEGATIVE 02/17/2017 1315   PROTEINUR 30 (A) 02/17/2017 1315   NITRITE NEGATIVE 02/17/2017 1315   LEUKOCYTESUR NEGATIVE 02/17/2017 1315      Recent Results (from the past 240 hour(s))  Blood Culture (routine x 2)     Status: None (Preliminary result)   Collection Time: 02/17/17 10:13 AM  Result Value Ref Range Status   Specimen Description BLOOD LEFT ANTECUBITAL  Final   Special Requests IN PEDIATRIC BOTTLE 4CC  Final   Culture NO GROWTH 2 DAYS  Final   Report Status PENDING  Incomplete  Blood Culture (routine x 2)     Status: None (Preliminary result)   Collection Time: 02/17/17 10:15 AM  Result Value Ref Range Status   Specimen Description BLOOD LEFT HAND  Final   Special Requests AEROBIC BOTTLE ONLY 6CC  Final   Culture NO GROWTH 2 DAYS  Final   Report Status PENDING  Incomplete  Body fluid culture     Status: None (Preliminary result)   Collection Time: 02/17/17 12:48 PM  Result Value Ref Range Status   Specimen Description  SYNOVIAL RIGHT KNEE  Final   Special Requests NONE  Final   Gram Stain   Final    MODERATE WBC PRESENT, PREDOMINANTLY MONONUCLEAR RARE WBC PRESENT, PREDOMINANTLY PMN NO ORGANISMS SEEN    Culture NO GROWTH 3 DAYS  Final   Report Status PENDING  Incomplete  MRSA PCR Screening     Status: None   Collection Time: 02/18/17  5:54 PM  Result Value Ref Range Status   MRSA by PCR NEGATIVE NEGATIVE Final    Comment:        The GeneXpert MRSA Assay (FDA approved for NASAL specimens only), is one component of a comprehensive MRSA colonization surveillance program. It is not intended to diagnose MRSA infection nor to guide or monitor treatment for MRSA infections.       Anti-infectives    Start     Dose/Rate Route Frequency Ordered Stop   02/18/17 1200  vancomycin (VANCOCIN) IVPB 1000 mg/200 mL premix     1,000 mg 200 mL/hr over 60 Minutes Intravenous Every 24 hours 02/17/17 1108 02/19/17 1210   02/17/17 1600  piperacillin-tazobactam (ZOSYN) IVPB 3.375 g     3.375 g 12.5 mL/hr over 240 Minutes Intravenous Every 8 hours 02/17/17 1108     02/17/17 1330  piperacillin-tazobactam (ZOSYN) IVPB 3.375 g  Status:  Discontinued     3.375 g 100 mL/hr over 30 Minutes Intravenous  Once 02/17/17 1317 02/17/17 1322   02/17/17 1330  vancomycin (VANCOCIN) IVPB 1000 mg/200 mL premix  Status:  Discontinued     1,000 mg 200 mL/hr over 60 Minutes Intravenous  Once 02/17/17 1317 02/17/17 1322   02/17/17 1000  piperacillin-tazobactam (ZOSYN) IVPB 3.375 g     3.375 g 100 mL/hr over 30 Minutes Intravenous  Once 02/17/17 0945 02/17/17 1038   02/17/17 1000  vancomycin (VANCOCIN) IVPB 1000 mg/200 mL premix  Status:  Discontinued     1,000 mg 200 mL/hr over 60 Minutes Intravenous  Once 02/17/17 0945 02/17/17 0953   02/17/17 1000  vancomycin (VANCOCIN) 1,250 mg in sodium chloride 0.9 % 250 mL IVPB     1,250 mg 166.7 mL/hr over 90 Minutes Intravenous  Once 02/17/17 0953 02/17/17 1306       Radiology  Studies:  Dg Chest Port 1 View  Result Date: 02/19/2017 CLINICAL DATA:  Sepsis, possible aspiration pneumonia. Patient admitted 02/17/2017. EXAM: PORTABLE CHEST 1 VIEW COMPARISON:  Single-view of the chest 02/18/2017 and 02/17/2017. FINDINGS: Right basilar atelectasis seen on yesterday's examination has resolved. Small focus of left basilar airspace disease is unchanged. There is no pneumothorax. Marked cardiomegaly without edema is seen. Tracheostomy tube is in place. IMPRESSION: No change in small focus of left basilar airspace disease. Resolved right basilar atelectasis. Cardiomegaly without edema. Electronically Signed   By: Inge Rise M.D.   On: 02/19/2017 07:56   Dg Chest Port 1 View  Result Date: 02/18/2017 CLINICAL DATA:  Ventilator dependent EXAM: PORTABLE CHEST 1 VIEW COMPARISON:  01/10/2017, 02/17/2017 FINDINGS: Tracheostomy tube in satisfactory position. Mild bilateral interstitial prominence likely secondary to low lung volumes. Mild right basilar atelectasis. No significant pleural effusion or pneumothorax. Stable cardiomegaly. No acute osseous abnormality. IMPRESSION: Cardiomegaly. No active cardiopulmonary disease. Electronically Signed   By: Kathreen Devoid   On: 02/18/2017 16:21   Dg Abd Portable 1v  Result Date: 02/20/2017 CLINICAL DATA:  Abdominal distention. Ileus. History of near total colectomy. EXAM: PORTABLE ABDOMEN - 1 VIEW COMPARISON:  02/18/2017 abdominal radiograph. 12/15/2013 CT abdomen/ pelvis. 01/25/2017 unenhanced CT abdomen. FINDINGS: Percutaneous gastrostomy catheter overlies the body of the stomach. Persistent gaseous distension of small bowel loop in the lower abdomen up to 5.2 cm diameter, not appreciably changed. No evidence of pneumatosis or pneumoperitoneum. No radiopaque urolithiasis. IMPRESSION: Stable dilated small bowel loop in the lower abdomen compatible with the provided history of adynamic ileus. No evidence of free air. Electronically Signed   By: Ilona Sorrel M.D.   On: 02/20/2017 10:30   Dg Abd Portable 1v  Result Date: 02/18/2017 CLINICAL DATA:  Hypoactive EXAM: PORTABLE ABDOMEN - 1 VIEW COMPARISON:  None. FINDINGS: Gaseous distended small bowel loops are noted mid lower abdomen suspicious for ileus or early bowel obstruction. IMPRESSION: Gaseous distended small bowel loops are noted mid lower abdomen suspicious for ileus or early bowel obstruction. Electronically Signed   By: Lahoma Crocker M.D.   On: 02/18/2017 20:22        Scheduled Meds: . bisacodyl  10 mg Rectal Daily  . insulin aspart  0-5 Units Subcutaneous QHS  . insulin aspart  0-9 Units Subcutaneous TID WC  . mouth rinse  15 mL Mouth Rinse q12n4p  . pantoprazole (PROTONIX) IV  40 mg Intravenous Q24H  . piperacillin-tazobactam (ZOSYN)  IV  3.375 g Intravenous Q8H  . sodium chloride flush  3 mL Intravenous Q12H   Continuous Infusions: . sodium chloride 75 mL/hr at 02/20/17 1301  . heparin 1,550 Units/hr (02/20/17 1105)     LOS: 3 days    Time spent: 22 min    [place], DO [facility] Pager [phone]  If 7PM-7AM, please contact night-coverage [email or web address] Password [email or web address] 02/20/2017, 2:16 PM

## 2017-02-21 DIAGNOSIS — R6510 Systemic inflammatory response syndrome (SIRS) of non-infectious origin without acute organ dysfunction: Secondary | ICD-10-CM

## 2017-02-21 DIAGNOSIS — J9621 Acute and chronic respiratory failure with hypoxia: Secondary | ICD-10-CM

## 2017-02-21 DIAGNOSIS — Z9189 Other specified personal risk factors, not elsewhere classified: Secondary | ICD-10-CM

## 2017-02-21 DIAGNOSIS — I483 Typical atrial flutter: Secondary | ICD-10-CM

## 2017-02-21 LAB — CBC
HCT: 22.5 % — ABNORMAL LOW (ref 36.0–46.0)
Hemoglobin: 7.1 g/dL — ABNORMAL LOW (ref 12.0–15.0)
MCH: 27.4 pg (ref 26.0–34.0)
MCHC: 31.6 g/dL (ref 30.0–36.0)
MCV: 86.9 fL (ref 78.0–100.0)
PLATELETS: 261 10*3/uL (ref 150–400)
RBC: 2.59 MIL/uL — ABNORMAL LOW (ref 3.87–5.11)
RDW: 18.9 % — AB (ref 11.5–15.5)
WBC: 21.1 10*3/uL — AB (ref 4.0–10.5)

## 2017-02-21 LAB — GLUCOSE, CAPILLARY
GLUCOSE-CAPILLARY: 138 mg/dL — AB (ref 65–99)
GLUCOSE-CAPILLARY: 165 mg/dL — AB (ref 65–99)
Glucose-Capillary: 155 mg/dL — ABNORMAL HIGH (ref 65–99)
Glucose-Capillary: 170 mg/dL — ABNORMAL HIGH (ref 65–99)
Glucose-Capillary: 158 mg/dL — ABNORMAL HIGH (ref 65–99)
Glucose-Capillary: 153 mg/dL — ABNORMAL HIGH (ref 65–99)

## 2017-02-21 LAB — BASIC METABOLIC PANEL
ANION GAP: 15 (ref 5–15)
BUN: 73 mg/dL — ABNORMAL HIGH (ref 6–20)
CALCIUM: 7.8 mg/dL — AB (ref 8.9–10.3)
CHLORIDE: 109 mmol/L (ref 101–111)
CO2: 17 mmol/L — ABNORMAL LOW (ref 22–32)
CREATININE: 2.22 mg/dL — AB (ref 0.44–1.00)
GFR calc non Af Amer: 21 mL/min — ABNORMAL LOW (ref >60)
GFR, EST AFRICAN AMERICAN: 24 mL/min — AB (ref >60)
Glucose, Bld: 160 mg/dL — ABNORMAL HIGH (ref 65–99)
Potassium: 4.7 mmol/L (ref 3.5–5.1)
SODIUM: 141 mmol/L (ref 135–145)

## 2017-02-21 LAB — BODY FLUID CULTURE: Culture: NO GROWTH

## 2017-02-21 LAB — HEPARIN LEVEL (UNFRACTIONATED)
HEPARIN UNFRACTIONATED: 0.45 [IU]/mL (ref 0.30–0.70)
Heparin Unfractionated: 0.43 IU/mL (ref 0.30–0.70)

## 2017-02-21 LAB — ABO/RH: ABO/RH(D): B POS

## 2017-02-21 LAB — HEMOGLOBIN AND HEMATOCRIT, BLOOD
HEMATOCRIT: 27.4 % — AB (ref 36.0–46.0)
HEMOGLOBIN: 8.7 g/dL — AB (ref 12.0–15.0)

## 2017-02-21 LAB — PREPARE RBC (CROSSMATCH)

## 2017-02-21 MED ORDER — AMIODARONE HCL IN DEXTROSE 360-4.14 MG/200ML-% IV SOLN: 30.0000 mg/h | INTRAVENOUS | Status: DC

## 2017-02-21 MED ORDER — PIPERACILLIN-TAZOBACTAM IN DEX 2-0.25 GM/50ML IV SOLN
2.2500 g | Freq: Four times a day (QID) | INTRAVENOUS | Status: DC
  Filled 2017-02-21 (×2): qty 50

## 2017-02-21 MED ORDER — SODIUM CHLORIDE 0.9 % IV SOLN
Freq: Once | INTRAVENOUS | Status: AC
  Administered 2017-02-21: 18:00:00 via INTRAVENOUS

## 2017-02-21 MED ORDER — DIGOXIN 0.25 MG/ML IJ SOLN
0.2500 mg | Freq: Once | INTRAMUSCULAR | Status: AC
  Administered 2017-02-21: 0.25 mg via INTRAVENOUS
  Filled 2017-02-21: qty 2

## 2017-02-21 MED ORDER — SODIUM CHLORIDE 0.9 % IV BOLUS (SEPSIS)
500.0000 mL | Freq: Once | INTRAVENOUS | Status: AC
  Administered 2017-02-21: 500 mL via INTRAVENOUS

## 2017-02-21 MED ORDER — PIPERACILLIN-TAZOBACTAM IN DEX 2-0.25 GM/50ML IV SOLN
2.2500 g | Freq: Four times a day (QID) | INTRAVENOUS | Status: DC
  Administered 2017-02-21 – 2017-03-01 (×28): 2.25 g via INTRAVENOUS
  Filled 2017-02-21 (×36): qty 50

## 2017-02-21 MED ORDER — AMIODARONE HCL IN DEXTROSE 360-4.14 MG/200ML-% IV SOLN
30.0000 mg/h | INTRAVENOUS | Status: DC
  Administered 2017-02-21 – 2017-02-24 (×5): 30 mg/h via INTRAVENOUS
  Filled 2017-02-21 (×6): qty 200

## 2017-02-21 MED ORDER — DILTIAZEM HCL 25 MG/5ML IV SOLN
10.0000 mg | Freq: Once | INTRAVENOUS | Status: AC
  Administered 2017-02-21: 10 mg via INTRAVENOUS
  Filled 2017-02-21: qty 5

## 2017-02-21 NOTE — Progress Notes (Signed)
ANTICOAGULATION & ANTIBIOTIC CONSULT NOTE - Follow-Up Pharmacy Consult for Heparin Indication: DVT  No Known Allergies  Patient Measurements: Height: 5\' 2"  (157.5 cm) Weight: 160 lb 15 oz (73 kg) IBW/kg (Calculated) : 50.1 Heparin Dosing Weight: 64 kg  Vital Signs: Temp: 99.7 F (37.6 C) (03/30 1214) Temp Source: Axillary (03/30 1214) BP: 93/42 (03/30 1214) Pulse Rate: 52 (03/30 1214)  Labs:  Recent Labs  02/19/17 0027  02/20/17 0711 02/20/17 1020 02/20/17 1714 02/20/17 2138 02/21/17 0306 02/21/17 1014  HGB 7.3*  < >  --  8.0*  --  7.3*  --  7.1*  HCT 22.8*  < >  --  25.4*  --  22.9*  --  22.5*  PLT 152  < >  --  236  --  245  --  261  HEPARINUNFRC 0.26*  < > 0.28*  --  0.23*  --  0.43 0.45  CREATININE 1.94*  --  2.04*  --   --   --  2.22*  --   < > = values in this interval not displayed.  Estimated Creatinine Clearance: 20.8 mL/min (A) (by C-G formula based on SCr of 2.22 mg/dL (H)).   Assessment: 64 YOF with metastatic colon CA who presented on 3/26 with fever and SIRS concerning for sepsis. The patient was also noted to have a new RLE DVT. Marland Kitchen  Heparin level therapeutic x 2 today on heparin infusion at 1750 units/hr. Hgb remains low but no reported bleeding.   Goal of Therapy:  Heparin level 0.3-0.7 units/ml Monitor platelets by anticoagulation protocol: Yes    Plan:  1. Continue heparin drip at 1750 units/hr (17.5 ml/hr) 2. Daily heparin level  3. Will continue to monitor for any signs/symptoms of bleeding  Thank you for allowing pharmacy to be a part of this patient's care.  Vincenza Hews, PharmD, BCPS 02/21/2017, 12:55 PM

## 2017-02-21 NOTE — Progress Notes (Signed)
Visited with patient per [facility] Consult. 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  Family requested prayer for patient and was specific of need.  Prayer was made and provided support to family member at bedside.  Patient April Franklin and church is visiting and supporting patient.  Chaplain available as needed.   02/21/17 1258  Clinical Encounter Type  Visited With Patient and family together;Health care provider  Visit Type Initial;Spiritual support  Referral From [facility])  Spiritual Encounters  Spiritual Needs Prayer;Emotional  Stress Factors  Family Stress Factors None identified    Jaclynn Major, [place]

## 2017-02-21 NOTE — Consult Note (Signed)
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 in patient with multiple cardiac stressors including metastatic CA, DVT, sepsis, severe anemia and respiratory failure. She is not a candidate for beta blockers or calcium channel blockers due to profound hypotension. Poor candidate for digoxin due to worsening renal function. Only option here is amiodarone. I explained to family that this will not prolong her life but may help in the short term to slow her HR and she could potentially convert to sinus rhythm. The other option is to do nothing which is quite reasonable given her terrible prognosis. After discussion family would like to try medication in the short term. I explained that in the long term this drug would be high risk for toxicity and I would not recommend this.   Johnathon Olden [place], [place] 02/21/2017 7:07 PM

## 2017-02-21 NOTE — Progress Notes (Addendum)
[facility]                                                                              Patient Demographics  April Franklin, is a 76 y.o. female, DOB - [birth year], KCL:[id number]  Admit date - 02/17/2017   Admitting Physician Waldemar Dickens, MD  Outpatient Primary MD for the patient is [name], MD  Outpatient specialists:   LOS - 4  days    Chief Complaint  Patient presents with  . 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 Chief complaint was leaking G-tube with intermittent fevers.  X ray shows ileus vs early obstruction.  Patient also had propagation of lower extremity DVT.   Overall prognosis very poor and family has vascillated with regards to her overall GOC. Currently pt is DNR.  Palliative care has met with family again and family wishes to pursue treatment of treatable conditions.  There are multiple family members involved in patient's care.     Assessment & Plan    Principal Problem: SIRS/Sepsis. Source unknown.  - Low-grade fevers with tachypnea, tachycardia, leukocytosis, elevated lactic acid, acute kidney injury, encephalopathy.  - Patient was placed on IV fluid resuscitation and broad-spectrum antibiotics, currently on IV Zosyn.  - C. difficile negative, MRSA PCR screening negative, no UTI. Chest x-ray with cardiac enlargement and mild edema.  - Continue IV Zosyn  Active problems Acute kidney injury. Likely related to dehydration secondary to decreased G-tube intake and emesis. Creatinine 1.24 on admission.  -Continue IV fluids, creatinine trending up, positive balance of 6.2 L -Hold  nephrotoxins  Abdominal distention with ileus with history of near total colectomy from adenocarcinoma colon/metastatic - Currently on tube feeds on hold, NPO - Abdominal x-ray 3/29 showed stable dilated small bowel loop in the lower abdomen compatible with the adynamic ileus no evidence of free air - Repeat serial abdominal x-ray, if improving, will start trickle tube feeds   Acute on chronic respiratory failure. Status post tracheostomy 2 hospitalizations ago. At that time, was also treated for aspiration pneumonia. Patient with tachypnea but no hypoxia. Chest x-ray as noted above. ABG with pH of 7.51, PCO2 26.7, PO2 87 bicarb 21.7. -See #1, continue antitussives, scopolamine, O2 supplementation as indicated, on IV Zosyn    Acute on chronic encephalopathy. Likely related to above in setting of recent stroke. Recent CT showed progressing of acute infarct on the left.  -   Previously followed by neurology and discharged on 3/9 by Dr. [name], multiple medical issues, overall poor prognosis - Currently not following any commands, palliative care has been following - Aspirin stopped last hospitalization   Hypertension. Home meds include losartan and beta blocker. -Currently borderline hypotensive, hold all antihypertensives    Diabetes. Serum glucose 155 on admission. Recent hemoglobin A1c is 7.2. Home medications include Lantus -Hold Lantus for now -Sliding scale insulin  for optimal control  Lower extremity edema. right worse than left  - DVT 2 hospitalizations ago.  Duplex had shown worsening DVT, patient is not ambulatory and hypercoagulable from adenocarcinoma of colon with metastasis - Anticoagulation stopped last hospitalization due to anemia. Synovial fluid aspirated in ED, cultures negative   Anemia- - suspect decrease in Hgb from volume dilution. Unclear source. During previous hospitalization, Dr [name] had discussed case with GI who did not recommend endoscopic evaluation given  significant cardiovascular risk involved - follow closely, will transfuse 1 unit pRBC  Recent NSTEMI - no echo done since increased troponin - no intervention possible due to patient's poor prognosis/current state  Sacral Pressure Injury 02/17/17 appears unstageable per nusing -WOC consult  Atrial Fib with RVR  - will check ekg, cardizem 63m IV x1  - currently on heparin gtt while inpatient -  During the previous admission, ASA and lovenox were discontinued as Hb continued to trend down and family was aware and agreed with the plan. Hence no plans of AC on discharge.  ADDENDUM: 4:00pm HR not improving, still in 130's, BP now in 80's, will give IV fluid bolus 500cc, digoxin x1 0.26m Called cardiology consult for further recommendations, spoke with LiMendel RyderNP.   Code Status: dnr  DVT Prophylaxis:  Heparin drip Family Communication: Discussed in detail with the patient, all imaging results, lab results explained to the patient's daughters, SaKatharine Lookt the bedside   Disposition Plan:  Unclear  Time Spent in minutes  25 minutes  Procedures:  Abdominal x-ray  Consultants:   Palliative care  Antimicrobials:   Zosyn 3/27 >>   Medications  Scheduled Meds: . bisacodyl  10 mg Rectal Daily  . chlorhexidine  15 mL Mouth Rinse BID  . insulin aspart  0-9 Units Subcutaneous Q4H  . mouth rinse  15 mL Mouth Rinse q12n4p  . pantoprazole (PROTONIX) IV  40 mg Intravenous Q24H  . piperacillin-tazobactam (ZOSYN)  IV  2.25 g Intravenous Q6H  . sodium chloride flush  3 mL Intravenous Q12H   Continuous Infusions: . sodium chloride 75 mL/hr at 02/21/17 0145  . heparin 1,750 Units/hr (02/21/17 0145)   PRN Meds:.acetaminophen **OR** acetaminophen, albuterol, morphine injection, ondansetron **OR** ondansetron (ZOFRAN) IV   Antibiotics   Anti-infectives    Start     Dose/Rate Route Frequency Ordered Stop   02/21/17 0830  piperacillin-tazobactam (ZOSYN) IVPB 2.25 g     2.25 g 100  mL/hr over 30 Minutes Intravenous Every 6 hours 02/21/17 0805     02/21/17 0815  piperacillin-tazobactam (ZOSYN) IVPB 2.25 g  Status:  Discontinued     2.25 g 100 mL/hr over 30 Minutes Intravenous Every 6 hours 02/21/17 0802 02/21/17 0805   02/18/17 1200  vancomycin (VANCOCIN) IVPB 1000 mg/200 mL premix     1,000 mg 200 mL/hr over 60 Minutes Intravenous Every 24 hours 02/17/17 1108 02/19/17 1210   02/17/17 1600  piperacillin-tazobactam (ZOSYN) IVPB 3.375 g  Status:  Discontinued     3.375 g 12.5 mL/hr over 240 Minutes Intravenous Every 8 hours 02/17/17 1108 02/21/17 0801   02/17/17 1330  piperacillin-tazobactam (ZOSYN) IVPB 3.375 g  Status:  Discontinued     3.375 g 100 mL/hr over 30 Minutes Intravenous  Once 02/17/17 1317 02/17/17 1322   02/17/17 1330  vancomycin (VANCOCIN) IVPB 1000 mg/200 mL premix  Status:  Discontinued     1,000 mg 200 mL/hr over 60 Minutes Intravenous  Once 02/17/17 1317 02/17/17 1322   02/17/17 1000  piperacillin-tazobactam (ZOSYN)  IVPB 3.375 g     3.375 g 100 mL/hr over 30 Minutes Intravenous  Once 02/17/17 0945 02/17/17 1038   02/17/17 1000  vancomycin (VANCOCIN) IVPB 1000 mg/200 mL premix  Status:  Discontinued     1,000 mg 200 mL/hr over 60 Minutes Intravenous  Once 02/17/17 0945 02/17/17 0953   02/17/17 1000  vancomycin (VANCOCIN) 1,250 mg in sodium chloride 0.9 % 250 mL IVPB     1,250 mg 166.7 mL/hr over 90 Minutes Intravenous  Once 02/17/17 4818 02/17/17 1306        Subjective:   April Franklin was seen and examined today.  Unresponsive to any verbal commands. Per daughter at the bedside, patient is more responsive to her and when she is listening to the preaching songs. Eyes open but not tracking. Per daughter, she becomes more alert when she is listening to her songs.  No acute events overnight.  Still spiking low-grade fevers.  Objective:   Vitals:   02/21/17 0439 02/21/17 0500 02/21/17 0700 02/21/17 0806  BP:   (!) 92/46 (!) 92/46  Pulse: 69   98 79  Resp:   (!) 40 (!) 42  Temp:   99.4 F (37.4 C)   TempSrc:   Axillary   SpO2: 100%  98% 97%  Weight:  73 kg (160 lb 15 oz)    Height:        Intake/Output Summary (Last 24 hours) at 02/21/17 1040 Last data filed at 02/21/17 0600  Gross per 24 hour  Intake          2073.95 ml  Output              360 ml  Net          1713.95 ml     Wt Readings from Last 3 Encounters:  02/21/17 73 kg (160 lb 15 oz)  02/04/17 65.2 kg (143 lb 11.2 oz)  01/31/17 63.2 kg (139 lb 5.3 oz)     Exam  General:Unresponsive, not responding to any verbal commands  HEENT:     Neck: Supple, trach +  Cardiovascular: S1 S2 auscultated, no rubs, murmurs or gallops. Regular rate and rhythm.  Respiratory: Decreased breath sounds at the bases  Gastrointestinal: Soft, nontender, PEG tube +   Ext: no cyanosis clubbing or edema  Neuro: does not cooperate   Skin: No rashes  Psych:  not responding to any verbal commands  Data Reviewed:  I have personally reviewed following labs and imaging studies  Micro Results Recent Results (from the past 240 hour(s))  Blood Culture (routine x 2)     Status: None (Preliminary result)   Collection Time: 02/17/17 10:13 AM  Result Value Ref Range Status   Specimen Description BLOOD LEFT ANTECUBITAL  Final   Special Requests IN PEDIATRIC BOTTLE 4CC  Final   Culture NO GROWTH 3 DAYS  Final   Report Status PENDING  Incomplete  Blood Culture (routine x 2)     Status: None (Preliminary result)   Collection Time: 02/17/17 10:15 AM  Result Value Ref Range Status   Specimen Description BLOOD LEFT HAND  Final   Special Requests AEROBIC BOTTLE ONLY 6CC  Final   Culture NO GROWTH 3 DAYS  Final   Report Status PENDING  Incomplete  Body fluid culture     Status: None (Preliminary result)   Collection Time: 02/17/17 12:48 PM  Result Value Ref Range Status   Specimen Description SYNOVIAL RIGHT KNEE  Final   Special Requests  NONE  Final   Gram Stain   Final     MODERATE WBC PRESENT, PREDOMINANTLY MONONUCLEAR RARE WBC PRESENT, PREDOMINANTLY PMN NO ORGANISMS SEEN    Culture NO GROWTH 3 DAYS  Final   Report Status PENDING  Incomplete  MRSA PCR Screening     Status: None   Collection Time: 02/18/17  5:54 PM  Result Value Ref Range Status   MRSA by PCR NEGATIVE NEGATIVE Final    Comment:        The GeneXpert MRSA Assay (FDA approved for NASAL specimens only), is one component of a comprehensive MRSA colonization surveillance program. It is not intended to diagnose MRSA infection nor to guide or monitor treatment for MRSA infections.     Radiology Reports Ct Abdomen Wo Contrast  Result Date: 01/25/2017 CLINICAL DATA:  Recent PEG placement and removal by patient. A Foley catheter has been placed. Evaluate PEG site. EXAM: CT ABDOMEN WITHOUT CONTRAST TECHNIQUE: Multidetector CT imaging of the abdomen was performed following the standard protocol without IV contrast. COMPARISON:  Abdominal MRI 12/09/2016 FINDINGS: Lower chest: Small pleural effusions, greater on the right. Atelectasis at the bases. Hepatobiliary: Multiple hepatic metastases. Cholelithiasis without signs of acute cholecystitis. Pancreas: Negative Spleen: Splenomegaly. When accounting for streak artifact there is no definite focal abnormality. Adrenals/Urinary Tract: No definite renal abnormality. No hydronephrosis. Stomach/Bowel: Percutaneous gastrostomy site has been cannulated with a Foley catheter which is in good position. The stomach is appropriately apposed to the abdominal wall. Abdominal wall reticulation around the gastrostomy site, with minimal gas, not unexpected given the history. There is no evidence of abdominal wall fluid collection. No dissecting soft tissue gas. Vascular/Lymphatic: Trace ascites. No pneumoperitoneum. No visualized bowel obstruction. Other: Negative Musculoskeletal: No acute finding IMPRESSION: 1. Recent percutaneous gastrostomy with Foley in place of the  displaced catheter. The Foley is in good position in the stomach is apposed to the abdominal wall. Mild soft tissue reticulation and gas at the PEG site without visible fluid collection. 2. Small ascites without pneumoperitoneum. Electronically Signed   By: Monte Fantasia M.D.   On: 01/25/2017 17:08   Ct Head Wo Contrast  Result Date: 02/04/2017 CLINICAL DATA:  Sepsis.  Unresponsive for several days. EXAM: CT HEAD WITHOUT CONTRAST TECHNIQUE: Contiguous axial images were obtained from the base of the skull through the vertex without intravenous contrast. COMPARISON:  01/31/2017 FINDINGS: Brain: Diffuse cerebral atrophy. Mild ventricular dilatation consistent with central atrophy. Low-attenuation changes in the deep white matter with old lacunar infarcts consistent with small vessel ischemic change. Area of developing encephalomalacia in the right occipital lobe and right greater than left cerebellar hemispheres consistent with evolving infarcts. There is increased low-attenuation change in sulcal effacement in the left occipital lobe since previous study suggesting developing subacute infarct. The area of involvement appears more prominent than on the recent MRI. No midline shift. No abnormal extra-axial fluid collections. Gray-white matter junctions are distinct. Basal cisterns are not effaced. No acute intracranial hemorrhage. Vascular: Vascular calcifications are present. Skull: Calvarium appears intact. Sinuses/Orbits: Visualized paranasal sinuses and mastoid air cells are not opacified. Other: None. IMPRESSION: Developing low-attenuation and sulcal effacement in the left occipital lobe suggesting progressing acute infarct. Expected evolutionary changes of previous infarct in the right occipital region and bilateral cerebellum. No acute intracranial hemorrhage or significant mass effect. Electronically Signed   By: Lucienne Capers M.D.   On: 02/04/2017 06:42   Ct Head Wo Contrast  Result Date:  01/31/2017 CLINICAL DATA:  Change in mental status.  EXAM: CT HEAD WITHOUT CONTRAST TECHNIQUE: Contiguous axial images were obtained from the base of the skull through the vertex without intravenous contrast. COMPARISON:  01/10/2017 FINDINGS: Brain: Expected progressive low-density and loss of mass-effect in the areas of late subacute infarct seen previously. The most confluent infarcts are in the right cerebellum and right occipital lobe, where there is also fading petechial hemorrhage. Small infarcts along the cerebral watershed at the vertex are largely underestimated relative to prior MRI. Small patchy subacute infarcts in the left cerebellum. Improved patency of the fourth ventricle. No hydrocephalus. No detected new infarct. Vascular: No asymmetric hyperdense vessel. Atherosclerotic calcification. Skull: No acute or aggressive finding Sinuses/Orbits: Gaze to the left. IMPRESSION: 1. No acute finding. 2. Expected evolution of recent extensive cerebral and cerebellar infarction. No evidence of infarct progression. Known petechial hemorrhage without progression. 3. Decrease cytotoxic edema in the posterior fossa with normalized fourth ventricle. Electronically Signed   By: Monte Fantasia M.D.   On: 01/31/2017 13:16   Dg Abdomen Peg Tube Location  Result Date: 02/04/2017 CLINICAL DATA:  Gastrostomy catheter placement EXAM: ABDOMEN - 1 VIEW COMPARISON:  Study obtained earlier in the day. FINDINGS: Contrast, 50 mL Isovue-300, was injected into a gastrostomy catheter. The gastrostomy catheter is positioned in the gastric antrum. Contrast flows into the stomach without contrast extravasation. Bowel gas pattern is normal. No free air. IMPRESSION: Gastrostomy catheter positioning gastric antrum. No contrast extravasation evident. Electronically Signed   By: Lowella Grip III M.D.   On: 02/04/2017 17:09   Dg Chest Port 1 View  Result Date: 02/19/2017 CLINICAL DATA:  Sepsis, possible aspiration pneumonia.  Patient admitted 02/17/2017. EXAM: PORTABLE CHEST 1 VIEW COMPARISON:  Single-view of the chest 02/18/2017 and 02/17/2017. FINDINGS: Right basilar atelectasis seen on yesterday's examination has resolved. Small focus of left basilar airspace disease is unchanged. There is no pneumothorax. Marked cardiomegaly without edema is seen. Tracheostomy tube is in place. IMPRESSION: No change in small focus of left basilar airspace disease. Resolved right basilar atelectasis. Cardiomegaly without edema. Electronically Signed   By: Inge Rise M.D.   On: 02/19/2017 07:56   Dg Chest Port 1 View  Result Date: 02/18/2017 CLINICAL DATA:  Ventilator dependent EXAM: PORTABLE CHEST 1 VIEW COMPARISON:  01/10/2017, 02/17/2017 FINDINGS: Tracheostomy tube in satisfactory position. Mild bilateral interstitial prominence likely secondary to low lung volumes. Mild right basilar atelectasis. No significant pleural effusion or pneumothorax. Stable cardiomegaly. No acute osseous abnormality. IMPRESSION: Cardiomegaly. No active cardiopulmonary disease. Electronically Signed   By: Kathreen Devoid   On: 02/18/2017 16:21   Dg Chest Portable 1 View  Result Date: 02/17/2017 CLINICAL DATA:  Fever and tachypnea. EXAM: PORTABLE CHEST 1 VIEW COMPARISON:  02/04/2017 FINDINGS: Tracheostomy tube tip is above the carina. The heart size is mildly enlarged. There is mild interstitial edema. No airspace opacities. IMPRESSION: 1. Cardiac enlargement and mild edema. Electronically Signed   By: Kerby Moors M.D.   On: 02/17/2017 10:53   Dg Chest Port 1 View  Result Date: 02/04/2017 CLINICAL DATA:  Sepsis, shortness of breath. History of colon cancer, recent for percutaneous gastrostomy tube placement. EXAM: PORTABLE CHEST - 1 VIEW; PORTABLE ABDOMEN - 1 VIEW COMPARISON:  CT abdomen and pelvis January 25, 2017 inch chest radiograph January 25, 2017 FINDINGS: Cardiac silhouette is upper limits of normal, mediastinal silhouette is nonsuspicious for this low  inspiratory examination with crowded vasculature markings. Patient rotated to the RIGHT, unfurling the aorta. RIGHT mid lung zone bandlike density. Pleural effusions or focal  consolidations. Trachea projects midline and there is no pneumothorax. Tracheostomy tube tip projects 3 cm above the carina. Included soft tissue planes and osseous structures are non-suspicious. Paucity of bowel gas on this single upright view. Single gas-filled nondistended loops below bowel in the LEFT lower quadrant. No intra-abdominal mass effect or pathologic calcifications. No intraperitoneal free air. Soft tissue planes and included osseous structures are nonsuspicious. IMPRESSION: RIGHT midlung zone atelectasis/scarring. Borderline cardiomegaly. Nonspecific bowel gas pattern. Electronically Signed   By: Elon Alas M.D.   On: 02/04/2017 06:18   Dg Chest Port 1 View  Result Date: 01/25/2017 CLINICAL DATA:  Respiratory failure EXAM: PORTABLE CHEST 1 VIEW COMPARISON:  Chest radiograph from one day prior. FINDINGS: Tracheostomy tube tip overlies the tracheal air column 3.2 cm above the carina. Stable cardiomediastinal silhouette with mild cardiomegaly and aortic atherosclerosis. No pneumothorax. No pleural effusion. Mild hazy right parahilar lung opacity appears stable. No new consolidative airspace disease. IMPRESSION: 1. Well-positioned tracheostomy tube. 2. Stable mild cardiomegaly. Stable mild hazy right parahilar lung opacity, favor atelectasis and/or mild pulmonary edema. 3. Aortic atherosclerosis. Electronically Signed   By: Ilona Sorrel M.D.   On: 01/25/2017 08:59   Dg Chest Port 1 View  Result Date: 01/24/2017 CLINICAL DATA:  Respiratory distress EXAM: PORTABLE CHEST 1 VIEW COMPARISON:  January 20, 2017 FINDINGS: Tracheostomy catheter tip is 2.7 cm above the carina. No pneumothorax. There has been partial clearing of airspace consolidation from the right base. Atelectatic appearing opacity is noted in the right mid  lung. There is also mild atelectasis in the left base. No new opacity evident. Heart is mildly enlarged with pulmonary vascularity within normal limits. No adenopathy. There is atherosclerotic calcification in the aorta. No bone lesions peer IMPRESSION: Tracheostomy catheter as described. No pneumothorax. Partial clearing right base patchy airspace opacity. Atelectatic change remains in the right mid lung as well as in the left base. No new opacity. Stable cardiac prominence. Aortic atherosclerosis present. Electronically Signed   By: Lowella Grip III M.D.   On: 01/24/2017 07:29   Dg Abd Portable 1v  Result Date: 02/20/2017 CLINICAL DATA:  Abdominal distention. Ileus. History of near total colectomy. EXAM: PORTABLE ABDOMEN - 1 VIEW COMPARISON:  02/18/2017 abdominal radiograph. 12/15/2013 CT abdomen/ pelvis. 01/25/2017 unenhanced CT abdomen. FINDINGS: Percutaneous gastrostomy catheter overlies the body of the stomach. Persistent gaseous distension of small bowel loop in the lower abdomen up to 5.2 cm diameter, not appreciably changed. No evidence of pneumatosis or pneumoperitoneum. No radiopaque urolithiasis. IMPRESSION: Stable dilated small bowel loop in the lower abdomen compatible with the provided history of adynamic ileus. No evidence of free air. Electronically Signed   By: Ilona Sorrel M.D.   On: 02/20/2017 10:30   Dg Abd Portable 1v  Result Date: 02/18/2017 CLINICAL DATA:  Hypoactive EXAM: PORTABLE ABDOMEN - 1 VIEW COMPARISON:  None. FINDINGS: Gaseous distended small bowel loops are noted mid lower abdomen suspicious for ileus or early bowel obstruction. IMPRESSION: Gaseous distended small bowel loops are noted mid lower abdomen suspicious for ileus or early bowel obstruction. Electronically Signed   By: Lahoma Crocker M.D.   On: 02/18/2017 20:22   Dg Abd Portable 1 View  Result Date: 02/04/2017 CLINICAL DATA:  Sepsis, shortness of breath. History of colon cancer, recent for percutaneous  gastrostomy tube placement. EXAM: PORTABLE CHEST - 1 VIEW; PORTABLE ABDOMEN - 1 VIEW COMPARISON:  CT abdomen and pelvis January 25, 2017 inch chest radiograph January 25, 2017 FINDINGS: Cardiac silhouette is upper limits  of normal, mediastinal silhouette is nonsuspicious for this low inspiratory examination with crowded vasculature markings. Patient rotated to the RIGHT, unfurling the aorta. RIGHT mid lung zone bandlike density. Pleural effusions or focal consolidations. Trachea projects midline and there is no pneumothorax. Tracheostomy tube tip projects 3 cm above the carina. Included soft tissue planes and osseous structures are non-suspicious. Paucity of bowel gas on this single upright view. Single gas-filled nondistended loops below bowel in the LEFT lower quadrant. No intra-abdominal mass effect or pathologic calcifications. No intraperitoneal free air. Soft tissue planes and included osseous structures are nonsuspicious. IMPRESSION: RIGHT midlung zone atelectasis/scarring. Borderline cardiomegaly. Nonspecific bowel gas pattern. Electronically Signed   By: Elon Alas M.D.   On: 02/04/2017 06:18   Dg Duanne Limerick W/water Sol Cm  Result Date: 01/28/2017 CLINICAL DATA:  Peg tube placed via endoscopy on 02/23, pulled out. Replaced yesterday with leakage. 18 French Foley placed today. EXAM: UPPER GI SERIES WITHOUT KUB TECHNIQUE: Routine upper GI series was performed with water-soluble barium. FLUOROSCOPY TIME:  Fluoroscopy Time:  42 seconds COMPARISON:  None. FINDINGS: 60 cc of Isovue-300 was administered via the indwelling PEG tube. Contrast filled the stomach. No extraluminal contrast leakage about the stomach or PEG tube. IMPRESSION: Contrast administration via the indwelling PEG tube demonstrating no extraluminal contrast leakage or other complicating feature. Electronically Signed   By: Franki Cabot M.D.   On: 01/28/2017 14:10   Dg Duanne Limerick W/water Sol Cm  Result Date: 01/27/2017 CLINICAL DATA:  Evaluate G-tube  placement. EXAM: WATER SOLUBLE UPPER GI SERIES CONTRAST:  60 mL of Isovue-300 FLUOROSCOPY TIME:  Fluoroscopy Time:  36 seconds Number of Acquired Spot Images: 0 COMPARISON:  None. FINDINGS: The Foley catheter distal tip terminates in the stomach with an associated retention balloon. The stomach is normal in location and shape. There is normal emptying into the duodenum. IMPRESSION: The Foley catheter/G tube has been appropriately placed in the stomach. Electronically Signed   By: Dorise Bullion III M.D   On: 01/27/2017 14:34    Lab Data:  CBC:  Recent Labs Lab 02/17/17 1012 02/19/17 0027 02/19/17 1002 02/19/17 2004 02/20/17 1020 02/20/17 2138  WBC 33.5* 13.7* 13.7* 14.4* 16.8* 19.2*  NEUTROABS 29.9*  --   --   --   --   --   HGB 10.3* 7.3* 7.0* 7.4* 8.0* 7.3*  HCT 33.0* 22.8* 22.0* 23.6* 25.4* 22.9*  MCV 89.7 88.0 88.0 87.4 87.9 87.1  PLT 141* 152 194 192 236 950   Basic Metabolic Panel:  Recent Labs Lab 02/17/17 1012 02/17/17 1621 02/18/17 1246 02/19/17 0027 02/20/17 0711 02/21/17 0306  NA 136  --  137 137 138 141  K 4.9  --  5.4* 5.5* 5.1 4.7  CL 97*  --  103 104 105 109  CO2 24  --  22 20* 18* 17*  GLUCOSE 155*  --  158* 159* 172* 160*  BUN 24*  --  48* 55* 65* 73*  CREATININE 1.24* 1.39* 1.90* 1.94* 2.04* 2.22*  CALCIUM 9.9  --  8.4* 8.1* 7.6* 7.8*   GFR: Estimated Creatinine Clearance: 20.8 mL/min (A) (by C-G formula based on SCr of 2.22 mg/dL (H)). Liver Function Tests:  Recent Labs Lab 02/17/17 1012  AST 89*  ALT 65*  ALKPHOS 533*  BILITOT 1.9*  PROT 6.5  ALBUMIN 2.0*   No results for input(s): LIPASE, AMYLASE in the last 168 hours. No results for input(s): AMMONIA in the last 168 hours. Coagulation Profile:  Recent Labs Lab  02/17/17 1621  INR 1.39   Cardiac Enzymes:  Recent Labs Lab 02/17/17 1621  CKTOTAL 43   BNP (last 3 results) No results for input(s): PROBNP in the last 8760 hours. HbA1C: No results for input(s): HGBA1C in the  last 72 hours. CBG:  Recent Labs Lab 02/20/17 1825 02/20/17 2058 02/21/17 0003 02/21/17 0441 02/21/17 0750  GLUCAP 149* 135* 138* 155* 170*   Lipid Profile: No results for input(s): CHOL, HDL, LDLCALC, TRIG, CHOLHDL, LDLDIRECT in the last 72 hours. Thyroid Function Tests: No results for input(s): TSH, T4TOTAL, FREET4, T3FREE, THYROIDAB in the last 72 hours. Anemia Panel: No results for input(s): VITAMINB12, FOLATE, FERRITIN, TIBC, IRON, RETICCTPCT in the last 72 hours. Urine analysis:    Component Value Date/Time   COLORURINE AMBER (A) 02/17/2017 1315   APPEARANCEUR HAZY (A) 02/17/2017 1315   LABSPEC 1.028 02/17/2017 1315   PHURINE 5.0 02/17/2017 1315   GLUCOSEU 50 (A) 02/17/2017 1315   HGBUR SMALL (A) 02/17/2017 1315   BILIRUBINUR SMALL (A) 02/17/2017 1315   KETONESUR NEGATIVE 02/17/2017 1315   PROTEINUR 30 (A) 02/17/2017 1315   NITRITE NEGATIVE 02/17/2017 1315   LEUKOCYTESUR NEGATIVE 02/17/2017 1315     RAI,RIPUDEEP M.D. [facility] 02/21/2017, 10:40 AM  Pager: [phone] Between 7am to 7pm - call Pager - [phone]  After 7pm go to [email or web address] - password [email or web address]  Call night coverage person covering after 7pm

## 2017-02-21 NOTE — Care Management Important Message (Signed)
Important Message  Patient Details  Name: April Franklin MRN: [id number] Date of Birth: December 16, 1940   Medicare Important Message Given:  Yes    Nathen May 02/21/2017, 10:52 AM

## 2017-02-21 NOTE — Progress Notes (Signed)
Pharmacy Antibiotic Note April Franklin is a 76 y.o. female admitted on 02/17/2017 with fever and SIRS concerning for sepsis. Currently on day 5 of Zosyn for treatment.  The patient's SCr continues to trend upwards and UOP marginal with about 500 ml UOP in last 24 hours.    Plan: 1. With renal function trend will empirically adjust Zosyn to 2.25 grams IV every 6 hours (infused over 30 minutes)    2. Will continue to follow renal function, culture results, LOT, and antibiotic de-escalation plans    Height: 5\' 2"  (157.5 cm) Weight: 160 lb 15 oz (73 kg) IBW/kg (Calculated) : 50.1  Temp (24hrs), Avg:99.1 F (37.3 C), Min:98.3 F (36.8 C), Max:99.5 F (37.5 C)   Recent Labs Lab 02/17/17 1026 02/17/17 1621 02/18/17 1246 02/19/17 0027 02/19/17 1002 02/19/17 2004 02/20/17 0711 02/20/17 1020 02/20/17 2138 02/21/17 0306  WBC  --   --   --  13.7* 13.7* 14.4*  --  16.8* 19.2*  --   CREATININE  --  1.39* 1.90* 1.94*  --   --  2.04*  --   --  2.22*  LATICACIDVEN 4.59* 3.5*  --   --   --   --   --   --   --   --   VANCORANDOM  --   --   --   --   --   --   --  30  --   --     Estimated Creatinine Clearance: 20.8 mL/min (A) (by C-G formula based on SCr of 2.22 mg/dL (H)).    No Known Allergies  Antimicrobials this admission:  Zosyn 3/26 >>  Vancomycin 3/26 >> 3/30  Dose adjustments this admission: 3/29 VR: 30 mcg/ml   Microbiology results: 3/26 BCx >> ngtd 3/27 MRSA PCR >> neg 3/26 R-knee fluid >> ngtd  Thank you for allowing pharmacy to be a part of this patient's care.  Vincenza Hews, PharmD, BCPS 02/21/2017, 7:58 AM Clinical phone for 02/21/2017 from 7a-3:30p: [phone] If after 3:30p, please call main pharmacy at: [phone] 02/21/2017 7:54 AM

## 2017-02-22 ENCOUNTER — Inpatient Hospital Stay (HOSPITAL_COMMUNITY): Payer: Medicare Other

## 2017-02-22 DIAGNOSIS — I480 Paroxysmal atrial fibrillation: Secondary | ICD-10-CM

## 2017-02-22 LAB — CBC
HEMATOCRIT: 24.1 % — AB (ref 36.0–46.0)
HEMOGLOBIN: 7.5 g/dL — AB (ref 12.0–15.0)
MCH: 27.6 pg (ref 26.0–34.0)
MCHC: 31.1 g/dL (ref 30.0–36.0)
MCV: 88.6 fL (ref 78.0–100.0)
Platelets: 255 10*3/uL (ref 150–400)
RBC: 2.72 MIL/uL — AB (ref 3.87–5.11)
RDW: 19.8 % — ABNORMAL HIGH (ref 11.5–15.5)
WBC: 22.5 10*3/uL — AB (ref 4.0–10.5)

## 2017-02-22 LAB — BPAM RBC
Blood Product Expiration Date: 201804122359
ISSUE DATE / TIME: 201803301716
UNIT TYPE AND RH: 7300

## 2017-02-22 LAB — GLUCOSE, CAPILLARY
GLUCOSE-CAPILLARY: 171 mg/dL — AB (ref 65–99)
GLUCOSE-CAPILLARY: 182 mg/dL — AB (ref 65–99)
GLUCOSE-CAPILLARY: 163 mg/dL — AB (ref 65–99)
Glucose-Capillary: 156 mg/dL — ABNORMAL HIGH (ref 65–99)
Glucose-Capillary: 162 mg/dL — ABNORMAL HIGH (ref 65–99)
Glucose-Capillary: 183 mg/dL — ABNORMAL HIGH (ref 65–99)

## 2017-02-22 LAB — TYPE AND SCREEN
ABO/RH(D): B POS
ANTIBODY SCREEN: NEGATIVE
Unit division: 0

## 2017-02-22 LAB — CULTURE, BLOOD (ROUTINE X 2)
Culture: NO GROWTH
Culture: NO GROWTH

## 2017-02-22 LAB — BASIC METABOLIC PANEL
ANION GAP: 18 — AB (ref 5–15)
BUN: 79 mg/dL — ABNORMAL HIGH (ref 6–20)
CHLORIDE: 107 mmol/L (ref 101–111)
CO2: 14 mmol/L — AB (ref 22–32)
Calcium: 7.5 mg/dL — ABNORMAL LOW (ref 8.9–10.3)
Creatinine, Ser: 2.26 mg/dL — ABNORMAL HIGH (ref 0.44–1.00)
GFR calc non Af Amer: 20 mL/min — ABNORMAL LOW (ref >60)
GFR, EST AFRICAN AMERICAN: 23 mL/min — AB (ref >60)
Glucose, Bld: 290 mg/dL — ABNORMAL HIGH (ref 65–99)
POTASSIUM: 4.2 mmol/L (ref 3.5–5.1)
Sodium: 139 mmol/L (ref 135–145)

## 2017-02-22 LAB — HEPARIN LEVEL (UNFRACTIONATED): HEPARIN UNFRACTIONATED: 0.36 [IU]/mL (ref 0.30–0.70)

## 2017-02-22 MED ORDER — SODIUM CHLORIDE 0.9 % IV SOLN
INTRAVENOUS | Status: DC
  Administered 2017-02-23: via INTRAVENOUS

## 2017-02-22 NOTE — Progress Notes (Signed)
[facility]                                                                              Patient Demographics  April Franklin, is a 76 y.o. female, DOB - June 03, 1941, PHX:[id number]  Admit date - 02/17/2017   Admitting Physician Waldemar Dickens, MD  Outpatient Primary MD for the patient is [name], MD  Outpatient specialists:   LOS - 5  days    Chief Complaint  Patient presents with  . 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 Chief complaint was leaking G-tube with intermittent fevers.  X ray shows ileus vs early obstruction.  Patient also had propagation of lower extremity DVT.   Overall prognosis very poor and family has vascillated with regards to her overall GOC. Currently pt is DNR.  Palliative care has met with family again and family wishes to pursue treatment of treatable conditions.  There are multiple family members involved in patient's care.     Assessment & Plan    Principal Problem: SIRS/Sepsis. Source unknown.  - Low-grade fevers with tachypnea, tachycardia, leukocytosis, elevated lactic acid, acute kidney injury, encephalopathy.  - Patient was placed on IV fluid resuscitation and broad-spectrum antibiotics, currently on IV Zosyn.  - C. difficile negative, MRSA PCR screening negative, no UTI. Chest x-ray with cardiac enlargement and mild edema.  - Continue IV Zosyn  Active problems Acute kidney injury. Likely related to dehydration secondary to decreased G-tube intake and emesis. Creatinine 1.24 on admission.  -Creatinine stable and plateaued however positive balance of 9.0L, decrease IV fluids at 50  cc/h -Hold nephrotoxins  Abdominal distention with ileus with history of near total colectomy from adenocarcinoma colon/metastatic - Currently on tube feeds on hold, NPO - Abdominal x-ray 3/31 showed improving ileus, but hypoactive bowel sounds - Repeat serial abdominal x-ray in am, if improving or resolved, will start trickle tube feeds   Acute on chronic respiratory failure. Status post tracheostomy 2 hospitalizations ago. At that time, was also treated for aspiration pneumonia. Patient with tachypnea but no hypoxia. Chest x-ray as noted above. ABG with pH of 7.51, PCO2 26.7, PO2 87 bicarb 21.7. -See #1, continue antitussives, scopolamine, O2 supplementation as indicated, on IV Zosyn    Acute on chronic encephalopathy. Likely related to above in setting of recent stroke. Recent CT showed progressing of acute infarct on the left.  - Previously followed by neurology and discharged on 3/9 by Dr. [name], multiple medical issues, overall poor prognosis - Currently not following any commands, palliative care has been following - Aspirin stopped last hospitalization   Hypertension. Home meds include losartan and beta blocker. -BP currently improving   Diabetes. Serum glucose 155 on admission. Recent hemoglobin A1c is 7.2. Home medications include Lantus -Hold Lantus for now -Sliding scale insulin for optimal control  Lower extremity edema. right worse than left  -  DVT 2 hospitalizations ago.  Duplex had shown worsening DVT, patient is not ambulatory and hypercoagulable from adenocarcinoma of colon with metastasis - Anticoagulation stopped last hospitalization due to anemia. Synovial fluid aspirated in ED, cultures negative   Anemia- - suspect decrease in Hgb from volume dilution. Unclear source. During previous hospitalization, Dr [name] had discussed case with GI who did not recommend endoscopic evaluation given significant cardiovascular risk involved - follow closely, will transfuse 1  unit pRBC  Recent NSTEMI - no echo done since increased troponin - no intervention possible due to patient's poor prognosis/current state  Sacral Pressure Injury 02/17/17 appears unstageable per nusing -WOC consult  Atrial Fib with RVR  - Back in normal sinus rhythm - Heart rate currently controlled, was started on amiodarone drip by cardiology, appreciate recommendations   Code Status: dnr  DVT Prophylaxis:  Heparin drip Family Communication: Discussed in detail with the patient, all imaging results, lab results explained to the patient's son and daughter-in-law at the bedside   Disposition Plan:  Unclear  Time Spent in minutes  25 minutes  Procedures:  Abdominal x-ray  Consultants:   Palliative care  Antimicrobials:   Zosyn 3/27 >>   Medications  Scheduled Meds: . bisacodyl  10 mg Rectal Daily  . chlorhexidine  15 mL Mouth Rinse BID  . insulin aspart  0-9 Units Subcutaneous Q4H  . mouth rinse  15 mL Mouth Rinse q12n4p  . pantoprazole (PROTONIX) IV  40 mg Intravenous Q24H  . piperacillin-tazobactam (ZOSYN)  IV  2.25 g Intravenous Q6H  . sodium chloride flush  3 mL Intravenous Q12H   Continuous Infusions: . sodium chloride 75 mL/hr at 02/22/17 0505  . amiodarone 30 mg/hr (02/22/17 0525)  . heparin 1,750 Units/hr (02/22/17 0856)   PRN Meds:.acetaminophen **OR** acetaminophen, albuterol, morphine injection, ondansetron **OR** ondansetron (ZOFRAN) IV   Antibiotics   Anti-infectives    Start     Dose/Rate Route Frequency Ordered Stop   02/21/17 0830  piperacillin-tazobactam (ZOSYN) IVPB 2.25 g     2.25 g 100 mL/hr over 30 Minutes Intravenous Every 6 hours 02/21/17 0805     02/21/17 0815  piperacillin-tazobactam (ZOSYN) IVPB 2.25 g  Status:  Discontinued     2.25 g 100 mL/hr over 30 Minutes Intravenous Every 6 hours 02/21/17 0802 02/21/17 0805   02/18/17 1200  vancomycin (VANCOCIN) IVPB 1000 mg/200 mL premix     1,000 mg 200 mL/hr over 60 Minutes  Intravenous Every 24 hours 02/17/17 1108 02/19/17 1210   02/17/17 1600  piperacillin-tazobactam (ZOSYN) IVPB 3.375 g  Status:  Discontinued     3.375 g 12.5 mL/hr over 240 Minutes Intravenous Every 8 hours 02/17/17 1108 02/21/17 0801   02/17/17 1330  piperacillin-tazobactam (ZOSYN) IVPB 3.375 g  Status:  Discontinued     3.375 g 100 mL/hr over 30 Minutes Intravenous  Once 02/17/17 1317 02/17/17 1322   02/17/17 1330  vancomycin (VANCOCIN) IVPB 1000 mg/200 mL premix  Status:  Discontinued     1,000 mg 200 mL/hr over 60 Minutes Intravenous  Once 02/17/17 1317 02/17/17 1322   02/17/17 1000  piperacillin-tazobactam (ZOSYN) IVPB 3.375 g     3.375 g 100 mL/hr over 30 Minutes Intravenous  Once 02/17/17 0945 02/17/17 1038   02/17/17 1000  vancomycin (VANCOCIN) IVPB 1000 mg/200 mL premix  Status:  Discontinued     1,000 mg 200 mL/hr over 60 Minutes Intravenous  Once 02/17/17 0945 02/17/17 0953   02/17/17 1000  vancomycin (VANCOCIN) 1,250 mg in sodium  chloride 0.9 % 250 mL IVPB     1,250 mg 166.7 mL/hr over 90 Minutes Intravenous  Once 02/17/17 6761 02/17/17 1306        Subjective:   April Franklin was seen and examined today.  Unresponsive to any verbal commands.  Eyes open but not tracking. Heart rate better controlled, on amiodarone drip.  Objective:   Vitals:   02/22/17 0800 02/22/17 0900 02/22/17 0945 02/22/17 1000  BP: (!) 112/55 (!) 110/52 (!) 110/52 (!) 110/56  Pulse: 64 63 64 62  Resp: (!) 38 (!) 38 (!) 46 (!) 39  Temp:      TempSrc:      SpO2: 92% 91% 90% 91%  Weight:      Height:        Intake/Output Summary (Last 24 hours) at 02/22/17 1122 Last data filed at 02/22/17 0856  Gross per 24 hour  Intake          2741.45 ml  Output              452 ml  Net          2289.45 ml     Wt Readings from Last 3 Encounters:  02/22/17 75 kg (165 lb 5.5 oz)  02/04/17 65.2 kg (143 lb 11.2 oz)  01/31/17 63.2 kg (139 lb 5.3 oz)     Exam  General:Unresponsive, not responding to  any verbal commands, Eyes open, family members at the bedside   HEENT:     Neck: Supple, trach +  Cardiovascular: S1 S2 auscultated, no rubs, murmurs or gallops. Regular rate and rhythm.  Respiratory: Decreased breath sounds at the bases  Gastrointestinal: Soft, nontender, PEG tube + , hypoactive bowel sounds  Ext: no cyanosis clubbing or edema  Neuro: does not cooperate   Skin: No rashes  Psych:  not responding to any verbal commands  Data Reviewed:  I have personally reviewed following labs and imaging studies  Micro Results Recent Results (from the past 240 hour(s))  Blood Culture (routine x 2)     Status: None (Preliminary result)   Collection Time: 02/17/17 10:13 AM  Result Value Ref Range Status   Specimen Description BLOOD LEFT ANTECUBITAL  Final   Special Requests IN PEDIATRIC BOTTLE 4CC  Final   Culture NO GROWTH 4 DAYS  Final   Report Status PENDING  Incomplete  Blood Culture (routine x 2)     Status: None (Preliminary result)   Collection Time: 02/17/17 10:15 AM  Result Value Ref Range Status   Specimen Description BLOOD LEFT HAND  Final   Special Requests AEROBIC BOTTLE ONLY 6CC  Final   Culture NO GROWTH 4 DAYS  Final   Report Status PENDING  Incomplete  Body fluid culture     Status: None   Collection Time: 02/17/17 12:48 PM  Result Value Ref Range Status   Specimen Description SYNOVIAL RIGHT KNEE  Final   Special Requests NONE  Final   Gram Stain   Final    MODERATE WBC PRESENT, PREDOMINANTLY MONONUCLEAR RARE WBC PRESENT, PREDOMINANTLY PMN NO ORGANISMS SEEN    Culture NO GROWTH 3 DAYS  Final   Report Status 02/21/2017 FINAL  Final  MRSA PCR Screening     Status: None   Collection Time: 02/18/17  5:54 PM  Result Value Ref Range Status   MRSA by PCR NEGATIVE NEGATIVE Final    Comment:        The GeneXpert MRSA Assay (FDA approved for NASAL specimens  only), is one component of a comprehensive MRSA colonization surveillance program. It is  not intended to diagnose MRSA infection nor to guide or monitor treatment for MRSA infections.     Radiology Reports Ct Abdomen Wo Contrast  Result Date: 01/25/2017 CLINICAL DATA:  Recent PEG placement and removal by patient. A Foley catheter has been placed. Evaluate PEG site. EXAM: CT ABDOMEN WITHOUT CONTRAST TECHNIQUE: Multidetector CT imaging of the abdomen was performed following the standard protocol without IV contrast. COMPARISON:  Abdominal MRI 12/09/2016 FINDINGS: Lower chest: Small pleural effusions, greater on the right. Atelectasis at the bases. Hepatobiliary: Multiple hepatic metastases. Cholelithiasis without signs of acute cholecystitis. Pancreas: Negative Spleen: Splenomegaly. When accounting for streak artifact there is no definite focal abnormality. Adrenals/Urinary Tract: No definite renal abnormality. No hydronephrosis. Stomach/Bowel: Percutaneous gastrostomy site has been cannulated with a Foley catheter which is in good position. The stomach is appropriately apposed to the abdominal wall. Abdominal wall reticulation around the gastrostomy site, with minimal gas, not unexpected given the history. There is no evidence of abdominal wall fluid collection. No dissecting soft tissue gas. Vascular/Lymphatic: Trace ascites. No pneumoperitoneum. No visualized bowel obstruction. Other: Negative Musculoskeletal: No acute finding IMPRESSION: 1. Recent percutaneous gastrostomy with Foley in place of the displaced catheter. The Foley is in good position in the stomach is apposed to the abdominal wall. Mild soft tissue reticulation and gas at the PEG site without visible fluid collection. 2. Small ascites without pneumoperitoneum. Electronically Signed   By: Monte Fantasia M.D.   On: 01/25/2017 17:08   Ct Head Wo Contrast  Result Date: 02/04/2017 CLINICAL DATA:  Sepsis.  Unresponsive for several days. EXAM: CT HEAD WITHOUT CONTRAST TECHNIQUE: Contiguous axial images were obtained from the base  of the skull through the vertex without intravenous contrast. COMPARISON:  01/31/2017 FINDINGS: Brain: Diffuse cerebral atrophy. Mild ventricular dilatation consistent with central atrophy. Low-attenuation changes in the deep white matter with old lacunar infarcts consistent with small vessel ischemic change. Area of developing encephalomalacia in the right occipital lobe and right greater than left cerebellar hemispheres consistent with evolving infarcts. There is increased low-attenuation change in sulcal effacement in the left occipital lobe since previous study suggesting developing subacute infarct. The area of involvement appears more prominent than on the recent MRI. No midline shift. No abnormal extra-axial fluid collections. Gray-white matter junctions are distinct. Basal cisterns are not effaced. No acute intracranial hemorrhage. Vascular: Vascular calcifications are present. Skull: Calvarium appears intact. Sinuses/Orbits: Visualized paranasal sinuses and mastoid air cells are not opacified. Other: None. IMPRESSION: Developing low-attenuation and sulcal effacement in the left occipital lobe suggesting progressing acute infarct. Expected evolutionary changes of previous infarct in the right occipital region and bilateral cerebellum. No acute intracranial hemorrhage or significant mass effect. Electronically Signed   By: Lucienne Capers M.D.   On: 02/04/2017 06:42   Ct Head Wo Contrast  Result Date: 01/31/2017 CLINICAL DATA:  Change in mental status. EXAM: CT HEAD WITHOUT CONTRAST TECHNIQUE: Contiguous axial images were obtained from the base of the skull through the vertex without intravenous contrast. COMPARISON:  01/10/2017 FINDINGS: Brain: Expected progressive low-density and loss of mass-effect in the areas of late subacute infarct seen previously. The most confluent infarcts are in the right cerebellum and right occipital lobe, where there is also fading petechial hemorrhage. Small infarcts along  the cerebral watershed at the vertex are largely underestimated relative to prior MRI. Small patchy subacute infarcts in the left cerebellum. Improved patency of the fourth ventricle. No  hydrocephalus. No detected new infarct. Vascular: No asymmetric hyperdense vessel. Atherosclerotic calcification. Skull: No acute or aggressive finding Sinuses/Orbits: Gaze to the left. IMPRESSION: 1. No acute finding. 2. Expected evolution of recent extensive cerebral and cerebellar infarction. No evidence of infarct progression. Known petechial hemorrhage without progression. 3. Decrease cytotoxic edema in the posterior fossa with normalized fourth ventricle. Electronically Signed   By: Monte Fantasia M.D.   On: 01/31/2017 13:16   Dg Abdomen Peg Tube Location  Result Date: 02/04/2017 CLINICAL DATA:  Gastrostomy catheter placement EXAM: ABDOMEN - 1 VIEW COMPARISON:  Study obtained earlier in the day. FINDINGS: Contrast, 50 mL Isovue-300, was injected into a gastrostomy catheter. The gastrostomy catheter is positioned in the gastric antrum. Contrast flows into the stomach without contrast extravasation. Bowel gas pattern is normal. No free air. IMPRESSION: Gastrostomy catheter positioning gastric antrum. No contrast extravasation evident. Electronically Signed   By: Lowella Grip III M.D.   On: 02/04/2017 17:09   Dg Chest Port 1 View  Result Date: 02/19/2017 CLINICAL DATA:  Sepsis, possible aspiration pneumonia. Patient admitted 02/17/2017. EXAM: PORTABLE CHEST 1 VIEW COMPARISON:  Single-view of the chest 02/18/2017 and 02/17/2017. FINDINGS: Right basilar atelectasis seen on yesterday's examination has resolved. Small focus of left basilar airspace disease is unchanged. There is no pneumothorax. Marked cardiomegaly without edema is seen. Tracheostomy tube is in place. IMPRESSION: No change in small focus of left basilar airspace disease. Resolved right basilar atelectasis. Cardiomegaly without edema. Electronically  Signed   By: Inge Rise M.D.   On: 02/19/2017 07:56   Dg Chest Port 1 View  Result Date: 02/18/2017 CLINICAL DATA:  Ventilator dependent EXAM: PORTABLE CHEST 1 VIEW COMPARISON:  01/10/2017, 02/17/2017 FINDINGS: Tracheostomy tube in satisfactory position. Mild bilateral interstitial prominence likely secondary to low lung volumes. Mild right basilar atelectasis. No significant pleural effusion or pneumothorax. Stable cardiomegaly. No acute osseous abnormality. IMPRESSION: Cardiomegaly. No active cardiopulmonary disease. Electronically Signed   By: Kathreen Devoid   On: 02/18/2017 16:21   Dg Chest Portable 1 View  Result Date: 02/17/2017 CLINICAL DATA:  Fever and tachypnea. EXAM: PORTABLE CHEST 1 VIEW COMPARISON:  02/04/2017 FINDINGS: Tracheostomy tube tip is above the carina. The heart size is mildly enlarged. There is mild interstitial edema. No airspace opacities. IMPRESSION: 1. Cardiac enlargement and mild edema. Electronically Signed   By: Kerby Moors M.D.   On: 02/17/2017 10:53   Dg Chest Port 1 View  Result Date: 02/04/2017 CLINICAL DATA:  Sepsis, shortness of breath. History of colon cancer, recent for percutaneous gastrostomy tube placement. EXAM: PORTABLE CHEST - 1 VIEW; PORTABLE ABDOMEN - 1 VIEW COMPARISON:  CT abdomen and pelvis January 25, 2017 inch chest radiograph January 25, 2017 FINDINGS: Cardiac silhouette is upper limits of normal, mediastinal silhouette is nonsuspicious for this low inspiratory examination with crowded vasculature markings. Patient rotated to the RIGHT, unfurling the aorta. RIGHT mid lung zone bandlike density. Pleural effusions or focal consolidations. Trachea projects midline and there is no pneumothorax. Tracheostomy tube tip projects 3 cm above the carina. Included soft tissue planes and osseous structures are non-suspicious. Paucity of bowel gas on this single upright view. Single gas-filled nondistended loops below bowel in the LEFT lower quadrant. No  intra-abdominal mass effect or pathologic calcifications. No intraperitoneal free air. Soft tissue planes and included osseous structures are nonsuspicious. IMPRESSION: RIGHT midlung zone atelectasis/scarring. Borderline cardiomegaly. Nonspecific bowel gas pattern. Electronically Signed   By: Elon Alas M.D.   On: 02/04/2017 06:18   Dg  Chest Port 1 View  Result Date: 01/25/2017 CLINICAL DATA:  Respiratory failure EXAM: PORTABLE CHEST 1 VIEW COMPARISON:  Chest radiograph from one day prior. FINDINGS: Tracheostomy tube tip overlies the tracheal air column 3.2 cm above the carina. Stable cardiomediastinal silhouette with mild cardiomegaly and aortic atherosclerosis. No pneumothorax. No pleural effusion. Mild hazy right parahilar lung opacity appears stable. No new consolidative airspace disease. IMPRESSION: 1. Well-positioned tracheostomy tube. 2. Stable mild cardiomegaly. Stable mild hazy right parahilar lung opacity, favor atelectasis and/or mild pulmonary edema. 3. Aortic atherosclerosis. Electronically Signed   By: Ilona Sorrel M.D.   On: 01/25/2017 08:59   Dg Chest Port 1 View  Result Date: 01/24/2017 CLINICAL DATA:  Respiratory distress EXAM: PORTABLE CHEST 1 VIEW COMPARISON:  January 20, 2017 FINDINGS: Tracheostomy catheter tip is 2.7 cm above the carina. No pneumothorax. There has been partial clearing of airspace consolidation from the right base. Atelectatic appearing opacity is noted in the right mid lung. There is also mild atelectasis in the left base. No new opacity evident. Heart is mildly enlarged with pulmonary vascularity within normal limits. No adenopathy. There is atherosclerotic calcification in the aorta. No bone lesions peer IMPRESSION: Tracheostomy catheter as described. No pneumothorax. Partial clearing right base patchy airspace opacity. Atelectatic change remains in the right mid lung as well as in the left base. No new opacity. Stable cardiac prominence. Aortic atherosclerosis  present. Electronically Signed   By: Lowella Grip III M.D.   On: 01/24/2017 07:29   Dg Abd Portable 1v  Result Date: 02/22/2017 CLINICAL DATA:  Followup ileus.  Previous near total colectomy. EXAM: PORTABLE ABDOMEN - 1 VIEW COMPARISON:  02/20/2017. FINDINGS: Mildly dilated small bowel in the lower abdomen currently has a maximum diameter of 4.3 cm, previously 5.2 cm. Paucity of gas in the mid and upper abdomen. No gross free peritoneal air. Lumbar and lower thoracic spine degenerative changes. IMPRESSION: Improving small bowel ileus. Electronically Signed   By: Claudie Revering M.D.   On: 02/22/2017 08:02   Dg Abd Portable 1v  Result Date: 02/20/2017 CLINICAL DATA:  Abdominal distention. Ileus. History of near total colectomy. EXAM: PORTABLE ABDOMEN - 1 VIEW COMPARISON:  02/18/2017 abdominal radiograph. 12/15/2013 CT abdomen/ pelvis. 01/25/2017 unenhanced CT abdomen. FINDINGS: Percutaneous gastrostomy catheter overlies the body of the stomach. Persistent gaseous distension of small bowel loop in the lower abdomen up to 5.2 cm diameter, not appreciably changed. No evidence of pneumatosis or pneumoperitoneum. No radiopaque urolithiasis. IMPRESSION: Stable dilated small bowel loop in the lower abdomen compatible with the provided history of adynamic ileus. No evidence of free air. Electronically Signed   By: Ilona Sorrel M.D.   On: 02/20/2017 10:30   Dg Abd Portable 1v  Result Date: 02/18/2017 CLINICAL DATA:  Hypoactive EXAM: PORTABLE ABDOMEN - 1 VIEW COMPARISON:  None. FINDINGS: Gaseous distended small bowel loops are noted mid lower abdomen suspicious for ileus or early bowel obstruction. IMPRESSION: Gaseous distended small bowel loops are noted mid lower abdomen suspicious for ileus or early bowel obstruction. Electronically Signed   By: Lahoma Crocker M.D.   On: 02/18/2017 20:22   Dg Abd Portable 1 View  Result Date: 02/04/2017 CLINICAL DATA:  Sepsis, shortness of breath. History of colon cancer,  recent for percutaneous gastrostomy tube placement. EXAM: PORTABLE CHEST - 1 VIEW; PORTABLE ABDOMEN - 1 VIEW COMPARISON:  CT abdomen and pelvis January 25, 2017 inch chest radiograph January 25, 2017 FINDINGS: Cardiac silhouette is upper limits of normal, mediastinal silhouette is nonsuspicious  for this low inspiratory examination with crowded vasculature markings. Patient rotated to the RIGHT, unfurling the aorta. RIGHT mid lung zone bandlike density. Pleural effusions or focal consolidations. Trachea projects midline and there is no pneumothorax. Tracheostomy tube tip projects 3 cm above the carina. Included soft tissue planes and osseous structures are non-suspicious. Paucity of bowel gas on this single upright view. Single gas-filled nondistended loops below bowel in the LEFT lower quadrant. No intra-abdominal mass effect or pathologic calcifications. No intraperitoneal free air. Soft tissue planes and included osseous structures are nonsuspicious. IMPRESSION: RIGHT midlung zone atelectasis/scarring. Borderline cardiomegaly. Nonspecific bowel gas pattern. Electronically Signed   By: Elon Alas M.D.   On: 02/04/2017 06:18   Dg Duanne Limerick W/water Sol Cm  Result Date: 01/28/2017 CLINICAL DATA:  Peg tube placed via endoscopy on 02/23, pulled out. Replaced yesterday with leakage. 5 French Foley placed today. EXAM: UPPER GI SERIES WITHOUT KUB TECHNIQUE: Routine upper GI series was performed with water-soluble barium. FLUOROSCOPY TIME:  Fluoroscopy Time:  42 seconds COMPARISON:  None. FINDINGS: 60 cc of Isovue-300 was administered via the indwelling PEG tube. Contrast filled the stomach. No extraluminal contrast leakage about the stomach or PEG tube. IMPRESSION: Contrast administration via the indwelling PEG tube demonstrating no extraluminal contrast leakage or other complicating feature. Electronically Signed   By: Franki Cabot M.D.   On: 01/28/2017 14:10   Dg Duanne Limerick W/water Sol Cm  Result Date: 01/27/2017 CLINICAL  DATA:  Evaluate G-tube placement. EXAM: WATER SOLUBLE UPPER GI SERIES CONTRAST:  60 mL of Isovue-300 FLUOROSCOPY TIME:  Fluoroscopy Time:  36 seconds Number of Acquired Spot Images: 0 COMPARISON:  None. FINDINGS: The Foley catheter distal tip terminates in the stomach with an associated retention balloon. The stomach is normal in location and shape. There is normal emptying into the duodenum. IMPRESSION: The Foley catheter/G tube has been appropriately placed in the stomach. Electronically Signed   By: Dorise Bullion III M.D   On: 01/27/2017 14:34    Lab Data:  CBC:  Recent Labs Lab 02/17/17 1012  02/19/17 2004 02/20/17 1020 02/20/17 2138 02/21/17 1014 02/21/17 2151 02/22/17 0239  WBC 33.5*  < > 14.4* 16.8* 19.2* 21.1*  --  22.5*  NEUTROABS 29.9*  --   --   --   --   --   --   --   HGB 10.3*  < > 7.4* 8.0* 7.3* 7.1* 8.7* 7.5*  HCT 33.0*  < > 23.6* 25.4* 22.9* 22.5* 27.4* 24.1*  MCV 89.7  < > 87.4 87.9 87.1 86.9  --  88.6  PLT 141*  < > 192 236 245 261  --  255  < > = values in this interval not displayed. Basic Metabolic Panel:  Recent Labs Lab 02/18/17 1246 02/19/17 0027 02/20/17 0711 02/21/17 0306 02/22/17 0239  NA 137 137 138 141 139  K 5.4* 5.5* 5.1 4.7 4.2  CL 103 104 105 109 107  CO2 22 20* 18* 17* 14*  GLUCOSE 158* 159* 172* 160* 290*  BUN 48* 55* 65* 73* 79*  CREATININE 1.90* 1.94* 2.04* 2.22* 2.26*  CALCIUM 8.4* 8.1* 7.6* 7.8* 7.5*   GFR: Estimated Creatinine Clearance: 20.7 mL/min (A) (by C-G formula based on SCr of 2.26 mg/dL (H)). Liver Function Tests:  Recent Labs Lab 02/17/17 1012  AST 89*  ALT 65*  ALKPHOS 533*  BILITOT 1.9*  PROT 6.5  ALBUMIN 2.0*   No results for input(s): LIPASE, AMYLASE in the last 168 hours. No results for  input(s): AMMONIA in the last 168 hours. Coagulation Profile:  Recent Labs Lab 02/17/17 1621  INR 1.39   Cardiac Enzymes:  Recent Labs Lab 02/17/17 1621  CKTOTAL 43   BNP (last 3 results) No results for  input(s): PROBNP in the last 8760 hours. HbA1C: No results for input(s): HGBA1C in the last 72 hours. CBG:  Recent Labs Lab 02/21/17 1633 02/21/17 2039 02/22/17 0024 02/22/17 0439 02/22/17 0750  GLUCAP 153* 165* 156* 171* 182*   Lipid Profile: No results for input(s): CHOL, HDL, LDLCALC, TRIG, CHOLHDL, LDLDIRECT in the last 72 hours. Thyroid Function Tests: No results for input(s): TSH, T4TOTAL, FREET4, T3FREE, THYROIDAB in the last 72 hours. Anemia Panel: No results for input(s): VITAMINB12, FOLATE, FERRITIN, TIBC, IRON, RETICCTPCT in the last 72 hours. Urine analysis:    Component Value Date/Time   COLORURINE AMBER (A) 02/17/2017 1315   APPEARANCEUR HAZY (A) 02/17/2017 1315   LABSPEC 1.028 02/17/2017 1315   PHURINE 5.0 02/17/2017 1315   GLUCOSEU 50 (A) 02/17/2017 1315   HGBUR SMALL (A) 02/17/2017 1315   BILIRUBINUR SMALL (A) 02/17/2017 1315   KETONESUR NEGATIVE 02/17/2017 1315   PROTEINUR 30 (A) 02/17/2017 1315   NITRITE NEGATIVE 02/17/2017 1315   LEUKOCYTESUR NEGATIVE 02/17/2017 1315     Jermel Artley M.D. [facility] 02/22/2017, 11:22 AM  Pager: [phone] Between 7am to 7pm - call Pager - [phone]  After 7pm go to [email or web address] - password [email or web address]  Call night coverage person covering after 7pm

## 2017-02-22 NOTE — Progress Notes (Signed)
Notified that pt converted from Afib RVR/A flutter back to NSR about 0100 am.  Pt currently on amnio gtt.  Will continue to monitor HR.

## 2017-02-22 NOTE — Progress Notes (Signed)
ANTICOAGULATION CONSULT NOTE - Follow-Up Pharmacy Consult for Heparin Indication: DVT  No Known Allergies  Patient Measurements: Height: 5\' 2"  (157.5 cm) Weight: 165 lb 5.5 oz (75 kg) IBW/kg (Calculated) : 50.1 Heparin Dosing Weight: 64 kg  Vital Signs: Temp: 98.3 F (36.8 C) (03/31 0747) Temp Source: Oral (03/31 0747) BP: 110/52 (03/31 0945) Pulse Rate: 64 (03/31 0945)  Labs:  Recent Labs  02/20/17 0711  02/20/17 2138 02/21/17 0306 02/21/17 1014 02/21/17 2151 02/22/17 0239  HGB  --   < > 7.3*  --  7.1* 8.7* 7.5*  HCT  --   < > 22.9*  --  22.5* 27.4* 24.1*  PLT  --   < > 245  --  261  --  255  HEPARINUNFRC 0.28*  < >  --  0.43 0.45  --  0.36  CREATININE 2.04*  --   --  2.22*  --   --  2.26*  < > = values in this interval not displayed.  Estimated Creatinine Clearance: 20.7 mL/min (A) (by C-G formula based on SCr of 2.26 mg/dL (H)).   Assessment: 44 YOF with metastatic colon CA who presented on 3/26 with fever and SIRS concerning for sepsis. The patient was also noted to have a new RLE DVT.   Heparin level still therapeutic today on heparin infusion at 1750 units/hr. PLTc stable, Hgb remains low but no reported bleeding. Of note, patient went into AFib with RVR and was started on amio gtt - now back in sinus rhythm.   Goal of Therapy:  Heparin level 0.3-0.7 units/ml Monitor platelets by anticoagulation protocol: Yes    Plan:  1. Continue heparin drip at 1750 units/hr (17.5 ml/hr) 2. Daily heparin level  3. Will continue to monitor for any signs/symptoms of bleeding  Thank you for allowing pharmacy to be a part of this patient's care.  Dierdre Harness, [place], PharmD Clinical Pharmacy Resident [phone] (Pager) 02/22/2017 10:12 AM

## 2017-02-22 NOTE — Progress Notes (Signed)
Progress Note  Patient Name: April Franklin Date of Encounter: 02/22/2017  Primary Cardiologist: Martin[place]  Subjective   Lethargic non verbal   Inpatient Medications    Scheduled Meds: . bisacodyl  10 mg Rectal Daily  . chlorhexidine  15 mL Mouth Rinse BID  . insulin aspart  0-9 Units Subcutaneous Q4H  . mouth rinse  15 mL Mouth Rinse q12n4p  . pantoprazole (PROTONIX) IV  40 mg Intravenous Q24H  . piperacillin-tazobactam (ZOSYN)  IV  2.25 g Intravenous Q6H  . sodium chloride flush  3 mL Intravenous Q12H   Continuous Infusions: . sodium chloride    . amiodarone 30 mg/hr (02/22/17 0525)  . heparin 1,750 Units/hr (02/22/17 0856)   PRN Meds: acetaminophen **OR** acetaminophen, albuterol, morphine injection, ondansetron **OR** ondansetron (ZOFRAN) IV   Vital Signs    Vitals:   02/22/17 0800 02/22/17 0900 02/22/17 0945 02/22/17 1000  BP: (!) 112/55 (!) 110/52 (!) 110/52 (!) 110/56  Pulse: 64 63 64 62  Resp: (!) 38 (!) 38 (!) 46 (!) 39  Temp:      TempSrc:      SpO2: 92% 91% 90% 91%  Weight:      Height:        Intake/Output Summary (Last 24 hours) at 02/22/17 1126 Last data filed at 02/22/17 0856  Gross per 24 hour  Intake          2741.45 ml  Output              452 ml  Net          2289.45 ml   Filed Weights   02/20/17 0500 02/21/17 0500 02/22/17 0435  Weight: 161 lb 2.5 oz (73.1 kg) 160 lb 15 oz (73 kg) 165 lb 5.5 oz (75 kg)    Telemetry    NSR rates 60's 02/22/2017  - Personally Reviewed  ECG    Atrial flutter rate 139 low voltage - Personally Reviewed  Physical Exam  Chronically ill female  GEN: No acute distress.   Neck: Trach  Cardiac: RRR, no murmurs, rubs, or gallops.  Respiratory: bilateral rhonchi  GI: Soft, nontender, non-distended  MS: No edema; No deformity. Neuro:  Nonfocal  Psych: Normal affect  RLE edema  Labs    Chemistry Recent Labs Lab 02/17/17 1012  02/20/17 0711 02/21/17 0306 02/22/17 0239  NA 136  < > 138 141 139  K  4.9  < > 5.1 4.7 4.2  CL 97*  < > 105 109 107  CO2 24  < > 18* 17* 14*  GLUCOSE 155*  < > 172* 160* 290*  BUN 24*  < > 65* 73* 79*  CREATININE 1.24*  < > 2.04* 2.22* 2.26*  CALCIUM 9.9  < > 7.6* 7.8* 7.5*  PROT 6.5  --   --   --   --   ALBUMIN 2.0*  --   --   --   --   AST 89*  --   --   --   --   ALT 65*  --   --   --   --   ALKPHOS 533*  --   --   --   --   BILITOT 1.9*  --   --   --   --   GFRNONAA 42*  < > 23* 21* 20*  GFRAA 48*  < > 26* 24* 23*  ANIONGAP 15  < > 15 15 18*  < > = values in  this interval not displayed.   Hematology  Recent Labs Lab 02/20/17 2138 02/21/17 1014 02/21/17 2151 02/22/17 0239  WBC 19.2* 21.1*  --  22.5*  RBC 2.63* 2.59*  --  2.72*  HGB 7.3* 7.1* 8.7* 7.5*  HCT 22.9* 22.5* 27.4* 24.1*  MCV 87.1 86.9  --  88.6  MCH 27.8 27.4  --  27.6  MCHC 31.9 31.6  --  31.1  RDW 19.0* 18.9*  --  19.8*  PLT 245 261  --  255    Cardiac EnzymesNo results for input(s): TROPONINI in the last 168 hours. No results for input(s): TROPIPOC in the last 168 hours.   BNPNo results for input(s): BNP, PROBNP in the last 168 hours.   DDimer No results for input(s): DDIMER in the last 168 hours.   Radiology    Dg Abd Portable 1v  Result Date: 02/22/2017 CLINICAL DATA:  Followup ileus.  Previous near total colectomy. EXAM: PORTABLE ABDOMEN - 1 VIEW COMPARISON:  02/20/2017. FINDINGS: Mildly dilated small bowel in the lower abdomen currently has a maximum diameter of 4.3 cm, previously 5.2 cm. Paucity of gas in the mid and upper abdomen. No gross free peritoneal air. Lumbar and lower thoracic spine degenerative changes. IMPRESSION: Improving small bowel ileus. Electronically Signed   By: Claudie Revering M.D.   On: 02/22/2017 08:02    Cardiac Studies   Duplex short segment DVT lef soleal vein Echo reviewed 01/10/17 EF 60-65% no valve disease normal LA size   Patient Profile     76 y.o. 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

## 2017-02-22 NOTE — Progress Notes (Signed)
New ATC set up per RRT no complications noted.

## 2017-02-22 NOTE — Plan of Care (Signed)
Problem: Education: Goal: Knowledge of [place] [facility] Education information/materials will improve Outcome: Not Progressing Pts VSS, pt continues to be non verbal and unable to follow commands. Pt remains on trach collar with fio2 at 40%. Pt remains NPO at this time with low urine output. Pt remains on heparin drip with no s/s of bleeding at this time. Family at bedside, all questions answered. Will continue to monitor.

## 2017-02-23 ENCOUNTER — Inpatient Hospital Stay (HOSPITAL_COMMUNITY): Payer: Medicare Other

## 2017-02-23 LAB — CBC
HCT: 24.7 % — ABNORMAL LOW (ref 36.0–46.0)
HEMOGLOBIN: 8.1 g/dL — AB (ref 12.0–15.0)
MCH: 27.5 pg (ref 26.0–34.0)
MCHC: 32.8 g/dL (ref 30.0–36.0)
MCV: 83.7 fL (ref 78.0–100.0)
Platelets: 189 10*3/uL (ref 150–400)
RBC: 2.95 MIL/uL — AB (ref 3.87–5.11)
RDW: 20.1 % — ABNORMAL HIGH (ref 11.5–15.5)
WBC: 25.1 10*3/uL — ABNORMAL HIGH (ref 4.0–10.5)

## 2017-02-23 LAB — BASIC METABOLIC PANEL
Anion gap: 19 — ABNORMAL HIGH (ref 5–15)
BUN: 84 mg/dL — ABNORMAL HIGH (ref 6–20)
CHLORIDE: 111 mmol/L (ref 101–111)
CO2: 9 mmol/L — ABNORMAL LOW (ref 22–32)
Calcium: 7.6 mg/dL — ABNORMAL LOW (ref 8.9–10.3)
Creatinine, Ser: 2.56 mg/dL — ABNORMAL HIGH (ref 0.44–1.00)
GFR calc non Af Amer: 17 mL/min — ABNORMAL LOW (ref >60)
GFR, EST AFRICAN AMERICAN: 20 mL/min — AB (ref >60)
Glucose, Bld: 207 mg/dL — ABNORMAL HIGH (ref 65–99)
POTASSIUM: 5 mmol/L (ref 3.5–5.1)
SODIUM: 139 mmol/L (ref 135–145)

## 2017-02-23 LAB — C DIFFICILE QUICK SCREEN W PCR REFLEX
C DIFFICILE (CDIFF) INTERP: NOT DETECTED
C Diff antigen: NEGATIVE
C Diff toxin: NEGATIVE

## 2017-02-23 LAB — HEPARIN LEVEL (UNFRACTIONATED): Heparin Unfractionated: 0.6 IU/mL (ref 0.30–0.70)

## 2017-02-23 LAB — GLUCOSE, CAPILLARY
GLUCOSE-CAPILLARY: 198 mg/dL — AB (ref 65–99)
GLUCOSE-CAPILLARY: 217 mg/dL — AB (ref 65–99)
GLUCOSE-CAPILLARY: 192 mg/dL — AB (ref 65–99)
Glucose-Capillary: 210 mg/dL — ABNORMAL HIGH (ref 65–99)
Glucose-Capillary: 199 mg/dL — ABNORMAL HIGH (ref 65–99)
Glucose-Capillary: 179 mg/dL — ABNORMAL HIGH (ref 65–99)

## 2017-02-23 MED ORDER — GLUCERNA 1.2 CAL PO LIQD
1500.0000 mL | ORAL | Status: DC
  Administered 2017-02-23: 1000 mL
  Administered 2017-02-25: 1500 mL
  Filled 2017-02-23 (×4): qty 1500

## 2017-02-23 NOTE — Progress Notes (Signed)
[facility]                                                                              Patient Demographics  April Franklin, is a 76 y.o. female, DOB - 03/04/1941, CXK:[id number]  Admit date - 02/17/2017   Admitting Physician Waldemar Dickens, MD  Outpatient Primary MD for the patient is [name], MD  Outpatient specialists:   LOS - 6  days    Chief Complaint  Patient presents with  . 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 Chief complaint was leaking G-tube with intermittent fevers.  X ray shows ileus vs early obstruction.  Patient also had propagation of lower extremity DVT.   Overall prognosis very poor and family has vascillated with regards to her overall GOC. Currently pt is DNR.  Palliative care has met with family again and family wishes to pursue treatment of treatable conditions.  There are multiple family members involved in patient's care.     Assessment & Plan    Principal Problem: SIRS/Sepsis Source unknown.  - Low-grade fevers with tachypnea, tachycardia, leukocytosis, elevated lactic acid, acute kidney injury, encephalopathy.  - Patient was placed on IV fluid resuscitation and broad-spectrum antibiotics, currently on IV Zosyn.  - C. difficile negative, MRSA PCR screening negative, no UTI. Chest x-ray with cardiac enlargement and mild edema.  - Continue IV Zosyn  Active problems Acute kidney injury. Likely related to dehydration secondary to decreased G-tube intake and emesis. Creatinine 1.24 on admission.  -Creatinine stable and plateaued however positive balance of 9.0L, decrease IV fluids at 50  cc/h -Hold nephrotoxins  Abdominal distention with ileus with history of near total colectomy from adenocarcinoma colon/metastatic -  Abdominal x-ray 4/1 showed stable ileus - Start trickle tube feeds, at 10 mL an hour to max goal of 20 mL/ hour for 24 hours, repeat abdominal x-ray in a.m.   Acute on chronic respiratory failure. Status post tracheostomy 2 hospitalizations ago. At that time, was also treated for aspiration pneumonia. Patient with tachypnea but no hypoxia. Chest x-ray as noted above. ABG with pH of 7.51, PCO2 26.7, PO2 87 bicarb 21.7. -See #1, continue antitussives, scopolamine, O2 supplementation as indicated, on IV Zosyn    Acute on chronic encephalopathy. Likely related to above in setting of recent stroke. Recent CT showed progressing of acute infarct on the left.  - Previously followed by neurology and discharged on 3/9 by Dr. [name], multiple medical issues, overall poor prognosis - Currently not following any commands, palliative care has been following - Aspirin stopped last hospitalization   Hypertension. Home meds include losartan and beta blocker. -BP currently improving   Diabetes. Serum glucose 155 on admission. Recent hemoglobin A1c is 7.2. Home medications include Lantus -Hold Lantus for now -Sliding scale insulin for optimal control  Lower extremity edema. right worse than left  -  DVT 2 hospitalizations ago.  Duplex had shown worsening DVT, patient is not ambulatory and hypercoagulable from adenocarcinoma of colon with metastasis - Anticoagulation stopped last hospitalization due to anemia. Synovial fluid aspirated in ED, cultures negative   Anemia- - suspect decrease in Hgb from volume dilution. Unclear source. During previous hospitalization, Dr [name] had discussed case with GI who did not recommend endoscopic evaluation given significant cardiovascular risk involved - follow closely, will transfuse 1 unit pRBC  Recent NSTEMI - no echo done since  increased troponin - no intervention possible due to patient's poor prognosis/current state  Sacral Pressure Injury 02/17/17 appears unstageable per nusing -WOC consult  Atrial Fib with RVR  - Back in normal sinus rhythm - Heart rate currently controlled, was started on amiodarone drip by cardiology, appreciate recommendations. Will not dc on oral amiodarone per cardiology   Code Status: dnr  DVT Prophylaxis:  Heparin drip Family Communication: Discussed in detail with the patient, all imaging results, lab results explained to the patient's son and daughter at the bedside   Disposition Plan:  Unclear  Time Spent in minutes  25 minutes  Procedures:  Abdominal x-ray  Consultants:   Palliative care  Antimicrobials:   Zosyn 3/27 >>   Medications  Scheduled Meds: . chlorhexidine  15 mL Mouth Rinse BID  . insulin aspart  0-9 Units Subcutaneous Q4H  . mouth rinse  15 mL Mouth Rinse q12n4p  . pantoprazole (PROTONIX) IV  40 mg Intravenous Q24H  . piperacillin-tazobactam (ZOSYN)  IV  2.25 g Intravenous Q6H  . sodium chloride flush  3 mL Intravenous Q12H   Continuous Infusions: . sodium chloride 50 mL/hr at 02/23/17 0400  . amiodarone 30 mg/hr (02/23/17 0601)  . heparin 1,750 Units/hr (02/23/17 0400)   PRN Meds:.acetaminophen **OR** acetaminophen, albuterol, morphine injection, ondansetron **OR** ondansetron (ZOFRAN) IV   Antibiotics   Anti-infectives    Start     Dose/Rate Route Frequency Ordered Stop   02/21/17 0830  piperacillin-tazobactam (ZOSYN) IVPB 2.25 g     2.25 g 100 mL/hr over 30 Minutes Intravenous Every 6 hours 02/21/17 0805     02/21/17 0815  piperacillin-tazobactam (ZOSYN) IVPB 2.25 g  Status:  Discontinued     2.25 g 100 mL/hr over 30 Minutes Intravenous Every 6 hours 02/21/17 0802 02/21/17 0805   02/18/17 1200  vancomycin (VANCOCIN) IVPB 1000 mg/200 mL premix     1,000 mg 200 mL/hr over 60 Minutes Intravenous Every 24 hours 02/17/17 1108 02/19/17  1210   02/17/17 1600  piperacillin-tazobactam (ZOSYN) IVPB 3.375 g  Status:  Discontinued     3.375 g 12.5 mL/hr over 240 Minutes Intravenous Every 8 hours 02/17/17 1108 02/21/17 0801   02/17/17 1330  piperacillin-tazobactam (ZOSYN) IVPB 3.375 g  Status:  Discontinued     3.375 g 100 mL/hr over 30 Minutes Intravenous  Once 02/17/17 1317 02/17/17 1322   02/17/17 1330  vancomycin (VANCOCIN) IVPB 1000 mg/200 mL premix  Status:  Discontinued     1,000 mg 200 mL/hr over 60 Minutes Intravenous  Once 02/17/17 1317 02/17/17 1322   02/17/17 1000  piperacillin-tazobactam (ZOSYN) IVPB 3.375 g     3.375 g 100 mL/hr over 30 Minutes Intravenous  Once 02/17/17 0945 02/17/17 1038   02/17/17 1000  vancomycin (VANCOCIN) IVPB 1000 mg/200 mL premix  Status:  Discontinued     1,000 mg 200 mL/hr over 60 Minutes Intravenous  Once 02/17/17 0945 02/17/17 0953   02/17/17 1000  vancomycin (VANCOCIN) 1,250 mg in sodium  chloride 0.9 % 250 mL IVPB     1,250 mg 166.7 mL/hr over 90 Minutes Intravenous  Once 02/17/17 3419 02/17/17 1306        Subjective:   April Franklin was seen and examined today.  Unresponsive to any verbal commands.  Eyes open, Daughter singing to the patient at the time of my encounter, states that patient is responsive to her. Heart rate better controlled, on amiodarone drip.  Objective:   Vitals:   02/23/17 0457 02/23/17 0742 02/23/17 0759 02/23/17 1148  BP: (!) 109/51 (!) 117/52 (!) 117/52 (!) 99/46  Pulse:  (!) 55    Resp: (!) 32 (!) 38    Temp:  98.2 F (36.8 C)    TempSrc:  Axillary    SpO2: 99% 96%    Weight:      Height:        Intake/Output Summary (Last 24 hours) at 02/23/17 1208 Last data filed at 02/23/17 0400  Gross per 24 hour  Intake           1397.2 ml  Output              355 ml  Net           1042.2 ml     Wt Readings from Last 3 Encounters:  02/23/17 81.1 kg (178 lb 12.7 oz)  02/04/17 65.2 kg (143 lb 11.2 oz)  01/31/17 63.2 kg (139 lb 5.3 oz)      Exam  General:Unresponsive, not responding to any verbal commands  HEENT:     Neck: Supple, trach +  Cardiovascular: S1 S2 auscultated, no rubs, murmurs or gallops. Regular rate and rhythm.  Respiratory: Decreased breath sounds at the bases  Gastrointestinal: Soft, nontender, PEG tube +   Ext: no cyanosis clubbing or edema  Neuro: does not cooperate   Skin: No rashes  Psych:  not responding to any verbal commands  Data Reviewed:  I have personally reviewed following labs and imaging studies  Micro Results Recent Results (from the past 240 hour(s))  Blood Culture (routine x 2)     Status: None   Collection Time: 02/17/17 10:13 AM  Result Value Ref Range Status   Specimen Description BLOOD LEFT ANTECUBITAL  Final   Special Requests IN PEDIATRIC BOTTLE 4CC  Final   Culture NO GROWTH 5 DAYS  Final   Report Status 02/22/2017 FINAL  Final  Blood Culture (routine x 2)     Status: None   Collection Time: 02/17/17 10:15 AM  Result Value Ref Range Status   Specimen Description BLOOD LEFT HAND  Final   Special Requests AEROBIC BOTTLE ONLY 6CC  Final   Culture NO GROWTH 5 DAYS  Final   Report Status 02/22/2017 FINAL  Final  Body fluid culture     Status: None   Collection Time: 02/17/17 12:48 PM  Result Value Ref Range Status   Specimen Description SYNOVIAL RIGHT KNEE  Final   Special Requests NONE  Final   Gram Stain   Final    MODERATE WBC PRESENT, PREDOMINANTLY MONONUCLEAR RARE WBC PRESENT, PREDOMINANTLY PMN NO ORGANISMS SEEN    Culture NO GROWTH 3 DAYS  Final   Report Status 02/21/2017 FINAL  Final  MRSA PCR Screening     Status: None   Collection Time: 02/18/17  5:54 PM  Result Value Ref Range Status   MRSA by PCR NEGATIVE NEGATIVE Final    Comment:        The GeneXpert MRSA  Assay (FDA approved for NASAL specimens only), is one component of a comprehensive MRSA colonization surveillance program. It is not intended to diagnose MRSA infection nor to guide  or monitor treatment for MRSA infections.     Radiology Reports Ct Abdomen Wo Contrast  Result Date: 01/25/2017 CLINICAL DATA:  Recent PEG placement and removal by patient. A Foley catheter has been placed. Evaluate PEG site. EXAM: CT ABDOMEN WITHOUT CONTRAST TECHNIQUE: Multidetector CT imaging of the abdomen was performed following the standard protocol without IV contrast. COMPARISON:  Abdominal MRI 12/09/2016 FINDINGS: Lower chest: Small pleural effusions, greater on the right. Atelectasis at the bases. Hepatobiliary: Multiple hepatic metastases. Cholelithiasis without signs of acute cholecystitis. Pancreas: Negative Spleen: Splenomegaly. When accounting for streak artifact there is no definite focal abnormality. Adrenals/Urinary Tract: No definite renal abnormality. No hydronephrosis. Stomach/Bowel: Percutaneous gastrostomy site has been cannulated with a Foley catheter which is in good position. The stomach is appropriately apposed to the abdominal wall. Abdominal wall reticulation around the gastrostomy site, with minimal gas, not unexpected given the history. There is no evidence of abdominal wall fluid collection. No dissecting soft tissue gas. Vascular/Lymphatic: Trace ascites. No pneumoperitoneum. No visualized bowel obstruction. Other: Negative Musculoskeletal: No acute finding IMPRESSION: 1. Recent percutaneous gastrostomy with Foley in place of the displaced catheter. The Foley is in good position in the stomach is apposed to the abdominal wall. Mild soft tissue reticulation and gas at the PEG site without visible fluid collection. 2. Small ascites without pneumoperitoneum. Electronically Signed   By: Monte Fantasia M.D.   On: 01/25/2017 17:08   Ct Head Wo Contrast  Result Date: 02/04/2017 CLINICAL DATA:  Sepsis.  Unresponsive for several days. EXAM: CT HEAD WITHOUT CONTRAST TECHNIQUE: Contiguous axial images were obtained from the base of the skull through the vertex without intravenous  contrast. COMPARISON:  01/31/2017 FINDINGS: Brain: Diffuse cerebral atrophy. Mild ventricular dilatation consistent with central atrophy. Low-attenuation changes in the deep white matter with old lacunar infarcts consistent with small vessel ischemic change. Area of developing encephalomalacia in the right occipital lobe and right greater than left cerebellar hemispheres consistent with evolving infarcts. There is increased low-attenuation change in sulcal effacement in the left occipital lobe since previous study suggesting developing subacute infarct. The area of involvement appears more prominent than on the recent MRI. No midline shift. No abnormal extra-axial fluid collections. Gray-white matter junctions are distinct. Basal cisterns are not effaced. No acute intracranial hemorrhage. Vascular: Vascular calcifications are present. Skull: Calvarium appears intact. Sinuses/Orbits: Visualized paranasal sinuses and mastoid air cells are not opacified. Other: None. IMPRESSION: Developing low-attenuation and sulcal effacement in the left occipital lobe suggesting progressing acute infarct. Expected evolutionary changes of previous infarct in the right occipital region and bilateral cerebellum. No acute intracranial hemorrhage or significant mass effect. Electronically Signed   By: Lucienne Capers M.D.   On: 02/04/2017 06:42   Ct Head Wo Contrast  Result Date: 01/31/2017 CLINICAL DATA:  Change in mental status. EXAM: CT HEAD WITHOUT CONTRAST TECHNIQUE: Contiguous axial images were obtained from the base of the skull through the vertex without intravenous contrast. COMPARISON:  01/10/2017 FINDINGS: Brain: Expected progressive low-density and loss of mass-effect in the areas of late subacute infarct seen previously. The most confluent infarcts are in the right cerebellum and right occipital lobe, where there is also fading petechial hemorrhage. Small infarcts along the cerebral watershed at the vertex are largely  underestimated relative to prior MRI. Small patchy subacute infarcts in the left cerebellum. Improved  patency of the fourth ventricle. No hydrocephalus. No detected new infarct. Vascular: No asymmetric hyperdense vessel. Atherosclerotic calcification. Skull: No acute or aggressive finding Sinuses/Orbits: Gaze to the left. IMPRESSION: 1. No acute finding. 2. Expected evolution of recent extensive cerebral and cerebellar infarction. No evidence of infarct progression. Known petechial hemorrhage without progression. 3. Decrease cytotoxic edema in the posterior fossa with normalized fourth ventricle. Electronically Signed   By: Monte Fantasia M.D.   On: 01/31/2017 13:16   Dg Abdomen Peg Tube Location  Result Date: 02/04/2017 CLINICAL DATA:  Gastrostomy catheter placement EXAM: ABDOMEN - 1 VIEW COMPARISON:  Study obtained earlier in the day. FINDINGS: Contrast, 50 mL Isovue-300, was injected into a gastrostomy catheter. The gastrostomy catheter is positioned in the gastric antrum. Contrast flows into the stomach without contrast extravasation. Bowel gas pattern is normal. No free air. IMPRESSION: Gastrostomy catheter positioning gastric antrum. No contrast extravasation evident. Electronically Signed   By: Lowella Grip III M.D.   On: 02/04/2017 17:09   Dg Chest Port 1 View  Result Date: 02/19/2017 CLINICAL DATA:  Sepsis, possible aspiration pneumonia. Patient admitted 02/17/2017. EXAM: PORTABLE CHEST 1 VIEW COMPARISON:  Single-view of the chest 02/18/2017 and 02/17/2017. FINDINGS: Right basilar atelectasis seen on yesterday's examination has resolved. Small focus of left basilar airspace disease is unchanged. There is no pneumothorax. Marked cardiomegaly without edema is seen. Tracheostomy tube is in place. IMPRESSION: No change in small focus of left basilar airspace disease. Resolved right basilar atelectasis. Cardiomegaly without edema. Electronically Signed   By: Inge Rise M.D.   On: 02/19/2017  07:56   Dg Chest Port 1 View  Result Date: 02/18/2017 CLINICAL DATA:  Ventilator dependent EXAM: PORTABLE CHEST 1 VIEW COMPARISON:  01/10/2017, 02/17/2017 FINDINGS: Tracheostomy tube in satisfactory position. Mild bilateral interstitial prominence likely secondary to low lung volumes. Mild right basilar atelectasis. No significant pleural effusion or pneumothorax. Stable cardiomegaly. No acute osseous abnormality. IMPRESSION: Cardiomegaly. No active cardiopulmonary disease. Electronically Signed   By: Kathreen Devoid   On: 02/18/2017 16:21   Dg Chest Portable 1 View  Result Date: 02/17/2017 CLINICAL DATA:  Fever and tachypnea. EXAM: PORTABLE CHEST 1 VIEW COMPARISON:  02/04/2017 FINDINGS: Tracheostomy tube tip is above the carina. The heart size is mildly enlarged. There is mild interstitial edema. No airspace opacities. IMPRESSION: 1. Cardiac enlargement and mild edema. Electronically Signed   By: Kerby Moors M.D.   On: 02/17/2017 10:53   Dg Chest Port 1 View  Result Date: 02/04/2017 CLINICAL DATA:  Sepsis, shortness of breath. History of colon cancer, recent for percutaneous gastrostomy tube placement. EXAM: PORTABLE CHEST - 1 VIEW; PORTABLE ABDOMEN - 1 VIEW COMPARISON:  CT abdomen and pelvis January 25, 2017 inch chest radiograph January 25, 2017 FINDINGS: Cardiac silhouette is upper limits of normal, mediastinal silhouette is nonsuspicious for this low inspiratory examination with crowded vasculature markings. Patient rotated to the RIGHT, unfurling the aorta. RIGHT mid lung zone bandlike density. Pleural effusions or focal consolidations. Trachea projects midline and there is no pneumothorax. Tracheostomy tube tip projects 3 cm above the carina. Included soft tissue planes and osseous structures are non-suspicious. Paucity of bowel gas on this single upright view. Single gas-filled nondistended loops below bowel in the LEFT lower quadrant. No intra-abdominal mass effect or pathologic calcifications. No  intraperitoneal free air. Soft tissue planes and included osseous structures are nonsuspicious. IMPRESSION: RIGHT midlung zone atelectasis/scarring. Borderline cardiomegaly. Nonspecific bowel gas pattern. Electronically Signed   By: Thana Farr.D.  On: 02/04/2017 06:18   Dg Chest Port 1 View  Result Date: 01/25/2017 CLINICAL DATA:  Respiratory failure EXAM: PORTABLE CHEST 1 VIEW COMPARISON:  Chest radiograph from one day prior. FINDINGS: Tracheostomy tube tip overlies the tracheal air column 3.2 cm above the carina. Stable cardiomediastinal silhouette with mild cardiomegaly and aortic atherosclerosis. No pneumothorax. No pleural effusion. Mild hazy right parahilar lung opacity appears stable. No new consolidative airspace disease. IMPRESSION: 1. Well-positioned tracheostomy tube. 2. Stable mild cardiomegaly. Stable mild hazy right parahilar lung opacity, favor atelectasis and/or mild pulmonary edema. 3. Aortic atherosclerosis. Electronically Signed   By: Ilona Sorrel M.D.   On: 01/25/2017 08:59   Dg Abd Portable 1v  Result Date: 02/23/2017 CLINICAL DATA:  Followup ileus. EXAM: PORTABLE ABDOMEN - 1 VIEW COMPARISON:  Yesterday. FINDINGS: The previously demonstrated dilated small bowel loop in the lower abdomen continues to have a maximum diameter of 4.3 cm. A paucity of intestinal gas is again demonstrated. Lumbar spine degenerative changes and bilateral pelvic phleboliths. IMPRESSION: Stable mild small bowel ileus. Electronically Signed   By: Claudie Revering M.D.   On: 02/23/2017 07:40   Dg Abd Portable 1v  Result Date: 02/22/2017 CLINICAL DATA:  Followup ileus.  Previous near total colectomy. EXAM: PORTABLE ABDOMEN - 1 VIEW COMPARISON:  02/20/2017. FINDINGS: Mildly dilated small bowel in the lower abdomen currently has a maximum diameter of 4.3 cm, previously 5.2 cm. Paucity of gas in the mid and upper abdomen. No gross free peritoneal air. Lumbar and lower thoracic spine degenerative changes.  IMPRESSION: Improving small bowel ileus. Electronically Signed   By: Claudie Revering M.D.   On: 02/22/2017 08:02   Dg Abd Portable 1v  Result Date: 02/20/2017 CLINICAL DATA:  Abdominal distention. Ileus. History of near total colectomy. EXAM: PORTABLE ABDOMEN - 1 VIEW COMPARISON:  02/18/2017 abdominal radiograph. 12/15/2013 CT abdomen/ pelvis. 01/25/2017 unenhanced CT abdomen. FINDINGS: Percutaneous gastrostomy catheter overlies the body of the stomach. Persistent gaseous distension of small bowel loop in the lower abdomen up to 5.2 cm diameter, not appreciably changed. No evidence of pneumatosis or pneumoperitoneum. No radiopaque urolithiasis. IMPRESSION: Stable dilated small bowel loop in the lower abdomen compatible with the provided history of adynamic ileus. No evidence of free air. Electronically Signed   By: Ilona Sorrel M.D.   On: 02/20/2017 10:30   Dg Abd Portable 1v  Result Date: 02/18/2017 CLINICAL DATA:  Hypoactive EXAM: PORTABLE ABDOMEN - 1 VIEW COMPARISON:  None. FINDINGS: Gaseous distended small bowel loops are noted mid lower abdomen suspicious for ileus or early bowel obstruction. IMPRESSION: Gaseous distended small bowel loops are noted mid lower abdomen suspicious for ileus or early bowel obstruction. Electronically Signed   By: Lahoma Crocker M.D.   On: 02/18/2017 20:22   Dg Abd Portable 1 View  Result Date: 02/04/2017 CLINICAL DATA:  Sepsis, shortness of breath. History of colon cancer, recent for percutaneous gastrostomy tube placement. EXAM: PORTABLE CHEST - 1 VIEW; PORTABLE ABDOMEN - 1 VIEW COMPARISON:  CT abdomen and pelvis January 25, 2017 inch chest radiograph January 25, 2017 FINDINGS: Cardiac silhouette is upper limits of normal, mediastinal silhouette is nonsuspicious for this low inspiratory examination with crowded vasculature markings. Patient rotated to the RIGHT, unfurling the aorta. RIGHT mid lung zone bandlike density. Pleural effusions or focal consolidations. Trachea projects  midline and there is no pneumothorax. Tracheostomy tube tip projects 3 cm above the carina. Included soft tissue planes and osseous structures are non-suspicious. Paucity of bowel gas on this single upright  view. Single gas-filled nondistended loops below bowel in the LEFT lower quadrant. No intra-abdominal mass effect or pathologic calcifications. No intraperitoneal free air. Soft tissue planes and included osseous structures are nonsuspicious. IMPRESSION: RIGHT midlung zone atelectasis/scarring. Borderline cardiomegaly. Nonspecific bowel gas pattern. Electronically Signed   By: Elon Alas M.D.   On: 02/04/2017 06:18   Dg Duanne Limerick W/water Sol Cm  Result Date: 01/28/2017 CLINICAL DATA:  Peg tube placed via endoscopy on 02/23, pulled out. Replaced yesterday with leakage. 80 French Foley placed today. EXAM: UPPER GI SERIES WITHOUT KUB TECHNIQUE: Routine upper GI series was performed with water-soluble barium. FLUOROSCOPY TIME:  Fluoroscopy Time:  42 seconds COMPARISON:  None. FINDINGS: 60 cc of Isovue-300 was administered via the indwelling PEG tube. Contrast filled the stomach. No extraluminal contrast leakage about the stomach or PEG tube. IMPRESSION: Contrast administration via the indwelling PEG tube demonstrating no extraluminal contrast leakage or other complicating feature. Electronically Signed   By: Franki Cabot M.D.   On: 01/28/2017 14:10   Dg Duanne Limerick W/water Sol Cm  Result Date: 01/27/2017 CLINICAL DATA:  Evaluate G-tube placement. EXAM: WATER SOLUBLE UPPER GI SERIES CONTRAST:  60 mL of Isovue-300 FLUOROSCOPY TIME:  Fluoroscopy Time:  36 seconds Number of Acquired Spot Images: 0 COMPARISON:  None. FINDINGS: The Foley catheter distal tip terminates in the stomach with an associated retention balloon. The stomach is normal in location and shape. There is normal emptying into the duodenum. IMPRESSION: The Foley catheter/G tube has been appropriately placed in the stomach. Electronically Signed   By: Dorise Bullion III M.D   On: 01/27/2017 14:34    Lab Data:  CBC:  Recent Labs Lab 02/17/17 1012  02/20/17 1020 02/20/17 2138 02/21/17 1014 02/21/17 2151 02/22/17 0239 02/23/17 0309  WBC 33.5*  < > 16.8* 19.2* 21.1*  --  22.5* 25.1*  NEUTROABS 29.9*  --   --   --   --   --   --   --   HGB 10.3*  < > 8.0* 7.3* 7.1* 8.7* 7.5* 8.1*  HCT 33.0*  < > 25.4* 22.9* 22.5* 27.4* 24.1* 24.7*  MCV 89.7  < > 87.9 87.1 86.9  --  88.6 83.7  PLT 141*  < > 236 245 261  --  255 189  < > = values in this interval not displayed. Basic Metabolic Panel:  Recent Labs Lab 02/19/17 0027 02/20/17 0711 02/21/17 0306 02/22/17 0239 02/23/17 0309  NA 137 138 141 139 139  K 5.5* 5.1 4.7 4.2 5.0  CL 104 105 109 107 111  CO2 20* 18* 17* 14* 9*  GLUCOSE 159* 172* 160* 290* 207*  BUN 55* 65* 73* 79* 84*  CREATININE 1.94* 2.04* 2.22* 2.26* 2.56*  CALCIUM 8.1* 7.6* 7.8* 7.5* 7.6*   GFR: Estimated Creatinine Clearance: 19 mL/min (A) (by C-G formula based on SCr of 2.56 mg/dL (H)). Liver Function Tests:  Recent Labs Lab 02/17/17 1012  AST 89*  ALT 65*  ALKPHOS 533*  BILITOT 1.9*  PROT 6.5  ALBUMIN 2.0*   No results for input(s): LIPASE, AMYLASE in the last 168 hours. No results for input(s): AMMONIA in the last 168 hours. Coagulation Profile:  Recent Labs Lab 02/17/17 1621  INR 1.39   Cardiac Enzymes:  Recent Labs Lab 02/17/17 1621  CKTOTAL 43   BNP (last 3 results) No results for input(s): PROBNP in the last 8760 hours. HbA1C: No results for input(s): HGBA1C in the last 72 hours. CBG:  Recent Labs  Lab 02/22/17 1215 02/22/17 1634 02/22/17 2353 02/23/17 0347 02/23/17 0810  GLUCAP 163* 162* 183* 198* 210*   Lipid Profile: No results for input(s): CHOL, HDL, LDLCALC, TRIG, CHOLHDL, LDLDIRECT in the last 72 hours. Thyroid Function Tests: No results for input(s): TSH, T4TOTAL, FREET4, T3FREE, THYROIDAB in the last 72 hours. Anemia Panel: No results for input(s): VITAMINB12,  FOLATE, FERRITIN, TIBC, IRON, RETICCTPCT in the last 72 hours. Urine analysis:    Component Value Date/Time   COLORURINE AMBER (A) 02/17/2017 1315   APPEARANCEUR HAZY (A) 02/17/2017 1315   LABSPEC 1.028 02/17/2017 1315   PHURINE 5.0 02/17/2017 1315   GLUCOSEU 50 (A) 02/17/2017 1315   HGBUR SMALL (A) 02/17/2017 1315   BILIRUBINUR SMALL (A) 02/17/2017 1315   KETONESUR NEGATIVE 02/17/2017 1315   PROTEINUR 30 (A) 02/17/2017 1315   NITRITE NEGATIVE 02/17/2017 1315   LEUKOCYTESUR NEGATIVE 02/17/2017 1315     RAI,RIPUDEEP M.D. [facility] 02/23/2017, 12:08 PM  Pager: [phone] Between 7am to 7pm - call Pager - [phone]  After 7pm go to [email or web address] - password [email or web address]  Call night coverage person covering after 7pm

## 2017-02-23 NOTE — Progress Notes (Signed)
Pt is sleeping resting comfortably at this time.

## 2017-02-23 NOTE — Progress Notes (Signed)
ANTICOAGULATION CONSULT NOTE - Follow-Up Pharmacy Consult for Heparin Indication: DVT  No Known Allergies  Patient Measurements: Height: 5\' 2"  (157.5 cm) Weight: 178 lb 12.7 oz (81.1 kg) IBW/kg (Calculated) : 50.1 Heparin Dosing Weight: 64 kg  Vital Signs: Temp: 98.2 F (36.8 C) (04/01 0742) Temp Source: Axillary (04/01 0742) BP: 117/52 (04/01 0759) Pulse Rate: 55 (04/01 0742)  Labs:  Recent Labs  02/21/17 0306 02/21/17 1014 02/21/17 2151 02/22/17 0239 02/23/17 0309 02/23/17 0449  HGB  --  7.1* 8.7* 7.5* 8.1*  --   HCT  --  22.5* 27.4* 24.1* 24.7*  --   PLT  --  261  --  255 189  --   HEPARINUNFRC 0.43 0.45  --  0.36  --  0.60  CREATININE 2.22*  --   --  2.26* 2.56*  --     Estimated Creatinine Clearance: 19 mL/min (A) (by C-G formula based on SCr of 2.56 mg/dL (H)).   Assessment: 35 YOF with metastatic colon CA who presented on 3/26 with fever and SIRS concerning for sepsis. The patient was also noted to have a new RLE DVT.   Heparin level remains therapeutic at current rate. PLTc stable, Hgb remains low but no reported bleeding. Of note, patient went into AFib with RVR and was started on amio gtt - now back in sinus rhythm.   Goal of Therapy:  Heparin level 0.3-0.7 units/ml Monitor platelets by anticoagulation protocol: Yes    Plan:  1. Continue heparin drip at 1750 units/hr (17.5 ml/hr) 2. Daily heparin level  3. Will continue to monitor for any signs/symptoms of bleeding  Thank you for allowing pharmacy to be a part of this patient's care.  Dierdre Harness, Cain Sieve, PharmD Clinical Pharmacy Resident [phone] (Pager) 02/23/2017 9:27 AM

## 2017-02-24 ENCOUNTER — Inpatient Hospital Stay (HOSPITAL_COMMUNITY): Payer: Medicare Other

## 2017-02-24 LAB — GLUCOSE, CAPILLARY
GLUCOSE-CAPILLARY: 214 mg/dL — AB (ref 65–99)
GLUCOSE-CAPILLARY: 250 mg/dL — AB (ref 65–99)
GLUCOSE-CAPILLARY: 249 mg/dL — AB (ref 65–99)
GLUCOSE-CAPILLARY: 288 mg/dL — AB (ref 65–99)
Glucose-Capillary: 158 mg/dL — ABNORMAL HIGH (ref 65–99)
Glucose-Capillary: 234 mg/dL — ABNORMAL HIGH (ref 65–99)
Glucose-Capillary: 241 mg/dL — ABNORMAL HIGH (ref 65–99)

## 2017-02-24 LAB — BASIC METABOLIC PANEL
Anion gap: 19 — ABNORMAL HIGH (ref 5–15)
BUN: 90 mg/dL — AB (ref 6–20)
CALCIUM: 7.9 mg/dL — AB (ref 8.9–10.3)
CO2: 12 mmol/L — ABNORMAL LOW (ref 22–32)
CREATININE: 2.94 mg/dL — AB (ref 0.44–1.00)
Chloride: 111 mmol/L (ref 101–111)
GFR calc Af Amer: 17 mL/min — ABNORMAL LOW (ref >60)
GFR, EST NON AFRICAN AMERICAN: 15 mL/min — AB (ref >60)
GLUCOSE: 244 mg/dL — AB (ref 65–99)
POTASSIUM: 4.3 mmol/L (ref 3.5–5.1)
SODIUM: 142 mmol/L (ref 135–145)

## 2017-02-24 LAB — CBC
HCT: 25.2 % — ABNORMAL LOW (ref 36.0–46.0)
Hemoglobin: 8.2 g/dL — ABNORMAL LOW (ref 12.0–15.0)
MCH: 27.1 pg (ref 26.0–34.0)
MCHC: 32.5 g/dL (ref 30.0–36.0)
MCV: 83.2 fL (ref 78.0–100.0)
Platelets: 273 10*3/uL (ref 150–400)
RBC: 3.03 MIL/uL — AB (ref 3.87–5.11)
RDW: 20.1 % — AB (ref 11.5–15.5)
WBC: 28.2 10*3/uL — ABNORMAL HIGH (ref 4.0–10.5)

## 2017-02-24 LAB — HEPARIN LEVEL (UNFRACTIONATED): Heparin Unfractionated: 0.55 IU/mL (ref 0.30–0.70)

## 2017-02-24 NOTE — Progress Notes (Addendum)
Initial Nutrition Assessment  DOCUMENTATION CODES:   Not applicable  INTERVENTION:    When PEG not leaking, recommend increase Glucerna 1.2 by 10 ml every 4 hours to goal rate of 60 ml/hr via PEG.   Provides 1728 kcal, 86 gm protein, 1159 ml free water daily.  NUTRITION DIAGNOSIS:   Inadequate oral intake related to inability to eat as evidenced by NPO status.  GOAL:   Patient will meet greater than or equal to 90% of their needs  MONITOR:   TF tolerance, Skin, I & O's, Labs  REASON FOR ASSESSMENT:   Consult Enteral/tube feeding initiation and management  ASSESSMENT:   76 y.o. 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 Chief complaint was leaking G-tube with intermittent fevers.  X ray shows ileus vs early obstruction.  Patient also had propagation of lower extremity DVT.    Received MD Consult for TF initiation and management. Currently receiving Glucerna 1.2 at 20 ml/h via PEG. Per discussion with patient's son, her rate at home was 30 ml/h. Nutrition-Focused physical exam completed. Findings are no fat depletion, mild-moderate muscle depletion, and mild edema.  Labs and medications reviewed. Noted poor prognosis. Family wishes to treat the treatable; continuing IV antibiotics. Palliative Care Team is following.  Weight is up since last admission, likely related to positive fluid balance, + 12 L since admission. Admission weight 143 lbs.  Diet Order:  Diet NPO time specified  Skin:  Wound (see comment) (stage II to buttocks)  Last BM:  3/31  Height:   Ht Readings from Last 1 Encounters:  02/17/17 5\' 2"  (1.575 m)    Weight:   Wt Readings from Last 1 Encounters:  02/23/17 178 lb 12.7 oz  (81.1 kg)   Admission weight 143 lbs  Ideal Body Weight:  50 kg  BMI:  26 using admission weight  Estimated Nutritional Needs:   Kcal:  1600-1800  Protein:  80-95 gm  Fluid:  1.6-1.8 L  EDUCATION NEEDS:   No education needs identified at this time  Molli Barrows, [place], [place], [employer] Pager [phone] After Hours Pager [phone]

## 2017-02-24 NOTE — Progress Notes (Signed)
[facility]                                                                              Patient Demographics  April Franklin, is a 76 y.o. female, DOB - [birth year], GGE:[id number]  Admit date - 02/17/2017   Admitting Physician Waldemar Dickens, MD  Outpatient Primary MD for the patient is [name], MD  Outpatient specialists:   LOS - 7  days    Chief Complaint  Patient presents with  . 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 Chief complaint was leaking G-tube with intermittent fevers.  X ray shows ileus vs early obstruction.  Patient also had propagation of lower extremity DVT.   Overall prognosis very poor and family has vascillated with regards to her overall GOC. Currently pt is DNR.  Palliative care has met with family again and family wishes to pursue treatment of treatable conditions.  There are multiple family members involved in patient's care.     Assessment & Plan    Principal Problem: SIRS/Sepsis Source unknown.  - Low-grade fevers with tachypnea, tachycardia, leukocytosis, elevated lactic acid, acute kidney injury, encephalopathy.  - Patient was placed on IV fluid resuscitation and broad-spectrum antibiotics, currently on IV Zosyn.  - C. difficile negative, MRSA PCR screening negative, no UTI. Chest x-ray with cardiac enlargement and mild edema.  - Continue IV Zosyn  Active problems Acute kidney injury. Likely related to dehydration secondary to decreased G-tube intake and emesis. Creatinine 1.24 on admission.  -Creatinine still trending up, however positive balance balance of 12 L, will DC IV fluids  -Hold  nephrotoxins  Abdominal distention with ileus with history of near total colectomy from adenocarcinoma colon/metastatic -  Abdominal x-ray 4/2 showed stable ileus - Continue tube feeds, at 10 mL an hour to max goal of 20 mL/ hour for 24 hours. If tube feeds started leaking, will need INR to look at the PEG tube site   Acute on chronic respiratory failure. Status post tracheostomy 2 hospitalizations ago. At that time, was also treated for aspiration pneumonia. Patient with tachypnea but no hypoxia. Chest x-ray as noted above. ABG with pH of 7.51, PCO2 26.7, PO2 87 bicarb 21.7. -See #1, continue antitussives, scopolamine, O2 supplementation as indicated, on IV Zosyn  - Obtain portable chest x-ray   Acute on chronic encephalopathy. Likely related to above in setting of recent stroke. Recent CT showed progressing of acute infarct on the left.  - Previously followed by neurology and discharged on 3/9 by Dr. [name], multiple medical issues, overall poor prognosis - Currently not following any commands, palliative care has been following - Aspirin stopped last hospitalization   Hypertension. Home meds include losartan and beta blocker. -BP currently improving   Diabetes. Serum glucose 155 on admission. Recent hemoglobin A1c is 7.2. Home medications include Lantus -Hold Lantus for now -Sliding  scale insulin for optimal control  Lower extremity edema. right worse than left  - DVT 2 hospitalizations ago.  Duplex had shown worsening DVT, patient is not ambulatory and hypercoagulable from adenocarcinoma of colon with metastasis - Anticoagulation stopped last hospitalization due to anemia. Synovial fluid aspirated in ED, cultures negative   Anemia- - suspect decrease in Hgb from volume dilution. Unclear source. During previous hospitalization, Dr [name] had discussed case with GI who did not recommend endoscopic evaluation given significant cardiovascular risk involved - follow closely, will  transfuse 1 unit pRBC  Recent NSTEMI - no echo done since increased troponin - no intervention possible due to patient's poor prognosis/current state  Sacral Pressure Injury 02/17/17 appears unstageable per nusing -WOC consult  Paroxysmal Atrial Fib with RVR  - Back in normal sinus rhythm -Cardiology stopped IV amiodarone currently on IV heparin. However she is not a candidate for oral anticoagulation  Code Status: dnr  DVT Prophylaxis:  Heparin drip Family Communication: Discussed in detail with the patient, all imaging results, lab results explained to the patient's grandson at the bedside   Disposition Plan:  Unclear  Time Spent in minutes  25 minutes  Procedures:  Abdominal x-ray  Consultants:   Palliative care  Antimicrobials:   Zosyn 3/27 >>   Medications  Scheduled Meds: . chlorhexidine  15 mL Mouth Rinse BID  . insulin aspart  0-9 Units Subcutaneous Q4H  . mouth rinse  15 mL Mouth Rinse q12n4p  . pantoprazole (PROTONIX) IV  40 mg Intravenous Q24H  . piperacillin-tazobactam (ZOSYN)  IV  2.25 g Intravenous Q6H  . sodium chloride flush  3 mL Intravenous Q12H   Continuous Infusions: . sodium chloride 50 mL/hr at 02/24/17 0300  . feeding supplement (GLUCERNA 1.2 CAL) 1,500 mL (02/24/17 0530)  . heparin 1,750 Units/hr (02/24/17 0300)   PRN Meds:.acetaminophen **OR** acetaminophen, albuterol, morphine injection, ondansetron **OR** ondansetron (ZOFRAN) IV   Antibiotics   Anti-infectives    Start     Dose/Rate Route Frequency Ordered Stop   02/21/17 0830  piperacillin-tazobactam (ZOSYN) IVPB 2.25 g     2.25 g 100 mL/hr over 30 Minutes Intravenous Every 6 hours 02/21/17 0805     02/21/17 0815  piperacillin-tazobactam (ZOSYN) IVPB 2.25 g  Status:  Discontinued     2.25 g 100 mL/hr over 30 Minutes Intravenous Every 6 hours 02/21/17 0802 02/21/17 0805   02/18/17 1200  vancomycin (VANCOCIN) IVPB 1000 mg/200 mL premix     1,000 mg 200 mL/hr over 60 Minutes  Intravenous Every 24 hours 02/17/17 1108 02/19/17 1210   02/17/17 1600  piperacillin-tazobactam (ZOSYN) IVPB 3.375 g  Status:  Discontinued     3.375 g 12.5 mL/hr over 240 Minutes Intravenous Every 8 hours 02/17/17 1108 02/21/17 0801   02/17/17 1330  piperacillin-tazobactam (ZOSYN) IVPB 3.375 g  Status:  Discontinued     3.375 g 100 mL/hr over 30 Minutes Intravenous  Once 02/17/17 1317 02/17/17 1322   02/17/17 1330  vancomycin (VANCOCIN) IVPB 1000 mg/200 mL premix  Status:  Discontinued     1,000 mg 200 mL/hr over 60 Minutes Intravenous  Once 02/17/17 1317 02/17/17 1322   02/17/17 1000  piperacillin-tazobactam (ZOSYN) IVPB 3.375 g     3.375 g 100 mL/hr over 30 Minutes Intravenous  Once 02/17/17 0945 02/17/17 1038   02/17/17 1000  vancomycin (VANCOCIN) IVPB 1000 mg/200 mL premix  Status:  Discontinued     1,000 mg 200 mL/hr over 60 Minutes Intravenous  Once 02/17/17 0945 02/17/17  7001   02/17/17 1000  vancomycin (VANCOCIN) 1,250 mg in sodium chloride 0.9 % 250 mL IVPB     1,250 mg 166.7 mL/hr over 90 Minutes Intravenous  Once 02/17/17 7494 02/17/17 1306        Subjective:   April Franklin was seen and examined today.  Unresponsive to any verbal commands.  Heart rate better controlled, on amiodarone drip.  Objective:   Vitals:   02/24/17 0347 02/24/17 0733 02/24/17 0814 02/24/17 1207  BP: (!) 107/47 (!) 114/48 (!) 110/46 (!) 124/52  Pulse: 61  61 61  Resp: (!) 35  (!) 32 (!) 31  Temp: 98.4 F (36.9 C)  97.9 F (36.6 C) 98.2 F (36.8 C)  TempSrc: Oral  Axillary Axillary  SpO2: 97%  94% 98%  Weight:      Height:        Intake/Output Summary (Last 24 hours) at 02/24/17 1300 Last data filed at 02/24/17 1209  Gross per 24 hour  Intake           1896.7 ml  Output              100 ml  Net           1796.7 ml     Wt Readings from Last 3 Encounters:  02/23/17 81.1 kg (178 lb 12.7 oz)  02/04/17 65.2 kg (143 lb 11.2 oz)  01/31/17 63.2 kg (139 lb 5.3 oz)      Exam  General:Unresponsive, not responding to any verbal commands  HEENT:     Neck: Supple, trach +  Cardiovascular: S1 S2 clear, RRR  Respiratory: Decreased breath sounds at the bases  Gastrointestinal: Soft, nontender, PEG tube +   Ext: no cyanosis clubbing or edema  Neuro: does not cooperate   Skin: No rashes  Psych:  not responding to any verbal commands  Data Reviewed:  I have personally reviewed following labs and imaging studies  Micro Results Recent Results (from the past 240 hour(s))  Blood Culture (routine x 2)     Status: None   Collection Time: 02/17/17 10:13 AM  Result Value Ref Range Status   Specimen Description BLOOD LEFT ANTECUBITAL  Final   Special Requests IN PEDIATRIC BOTTLE 4CC  Final   Culture NO GROWTH 5 DAYS  Final   Report Status 02/22/2017 FINAL  Final  Blood Culture (routine x 2)     Status: None   Collection Time: 02/17/17 10:15 AM  Result Value Ref Range Status   Specimen Description BLOOD LEFT HAND  Final   Special Requests AEROBIC BOTTLE ONLY 6CC  Final   Culture NO GROWTH 5 DAYS  Final   Report Status 02/22/2017 FINAL  Final  Body fluid culture     Status: None   Collection Time: 02/17/17 12:48 PM  Result Value Ref Range Status   Specimen Description SYNOVIAL RIGHT KNEE  Final   Special Requests NONE  Final   Gram Stain   Final    MODERATE WBC PRESENT, PREDOMINANTLY MONONUCLEAR RARE WBC PRESENT, PREDOMINANTLY PMN NO ORGANISMS SEEN    Culture NO GROWTH 3 DAYS  Final   Report Status 02/21/2017 FINAL  Final  MRSA PCR Screening     Status: None   Collection Time: 02/18/17  5:54 PM  Result Value Ref Range Status   MRSA by PCR NEGATIVE NEGATIVE Final    Comment:        The GeneXpert MRSA Assay (FDA approved for NASAL specimens only), is one component  of a comprehensive MRSA colonization surveillance program. It is not intended to diagnose MRSA infection nor to guide or monitor treatment for MRSA infections.   C  difficile quick scan w PCR reflex     Status: None   Collection Time: 02/23/17  1:47 PM  Result Value Ref Range Status   C Diff antigen NEGATIVE NEGATIVE Final   C Diff toxin NEGATIVE NEGATIVE Final   C Diff interpretation No C. difficile detected.  Final    Radiology Reports Ct Abdomen Wo Contrast  Result Date: 01/25/2017 CLINICAL DATA:  Recent PEG placement and removal by patient. A Foley catheter has been placed. Evaluate PEG site. EXAM: CT ABDOMEN WITHOUT CONTRAST TECHNIQUE: Multidetector CT imaging of the abdomen was performed following the standard protocol without IV contrast. COMPARISON:  Abdominal MRI 12/09/2016 FINDINGS: Lower chest: Small pleural effusions, greater on the right. Atelectasis at the bases. Hepatobiliary: Multiple hepatic metastases. Cholelithiasis without signs of acute cholecystitis. Pancreas: Negative Spleen: Splenomegaly. When accounting for streak artifact there is no definite focal abnormality. Adrenals/Urinary Tract: No definite renal abnormality. No hydronephrosis. Stomach/Bowel: Percutaneous gastrostomy site has been cannulated with a Foley catheter which is in good position. The stomach is appropriately apposed to the abdominal wall. Abdominal wall reticulation around the gastrostomy site, with minimal gas, not unexpected given the history. There is no evidence of abdominal wall fluid collection. No dissecting soft tissue gas. Vascular/Lymphatic: Trace ascites. No pneumoperitoneum. No visualized bowel obstruction. Other: Negative Musculoskeletal: No acute finding IMPRESSION: 1. Recent percutaneous gastrostomy with Foley in place of the displaced catheter. The Foley is in good position in the stomach is apposed to the abdominal wall. Mild soft tissue reticulation and gas at the PEG site without visible fluid collection. 2. Small ascites without pneumoperitoneum. Electronically Signed   By: Monte Fantasia M.D.   On: 01/25/2017 17:08   Ct Head Wo Contrast  Result Date:  02/04/2017 CLINICAL DATA:  Sepsis.  Unresponsive for several days. EXAM: CT HEAD WITHOUT CONTRAST TECHNIQUE: Contiguous axial images were obtained from the base of the skull through the vertex without intravenous contrast. COMPARISON:  01/31/2017 FINDINGS: Brain: Diffuse cerebral atrophy. Mild ventricular dilatation consistent with central atrophy. Low-attenuation changes in the deep white matter with old lacunar infarcts consistent with small vessel ischemic change. Area of developing encephalomalacia in the right occipital lobe and right greater than left cerebellar hemispheres consistent with evolving infarcts. There is increased low-attenuation change in sulcal effacement in the left occipital lobe since previous study suggesting developing subacute infarct. The area of involvement appears more prominent than on the recent MRI. No midline shift. No abnormal extra-axial fluid collections. Gray-white matter junctions are distinct. Basal cisterns are not effaced. No acute intracranial hemorrhage. Vascular: Vascular calcifications are present. Skull: Calvarium appears intact. Sinuses/Orbits: Visualized paranasal sinuses and mastoid air cells are not opacified. Other: None. IMPRESSION: Developing low-attenuation and sulcal effacement in the left occipital lobe suggesting progressing acute infarct. Expected evolutionary changes of previous infarct in the right occipital region and bilateral cerebellum. No acute intracranial hemorrhage or significant mass effect. Electronically Signed   By: Lucienne Capers M.D.   On: 02/04/2017 06:42   Ct Head Wo Contrast  Result Date: 01/31/2017 CLINICAL DATA:  Change in mental status. EXAM: CT HEAD WITHOUT CONTRAST TECHNIQUE: Contiguous axial images were obtained from the base of the skull through the vertex without intravenous contrast. COMPARISON:  01/10/2017 FINDINGS: Brain: Expected progressive low-density and loss of mass-effect in the areas of late subacute infarct seen  previously.  The most confluent infarcts are in the right cerebellum and right occipital lobe, where there is also fading petechial hemorrhage. Small infarcts along the cerebral watershed at the vertex are largely underestimated relative to prior MRI. Small patchy subacute infarcts in the left cerebellum. Improved patency of the fourth ventricle. No hydrocephalus. No detected new infarct. Vascular: No asymmetric hyperdense vessel. Atherosclerotic calcification. Skull: No acute or aggressive finding Sinuses/Orbits: Gaze to the left. IMPRESSION: 1. No acute finding. 2. Expected evolution of recent extensive cerebral and cerebellar infarction. No evidence of infarct progression. Known petechial hemorrhage without progression. 3. Decrease cytotoxic edema in the posterior fossa with normalized fourth ventricle. Electronically Signed   By: Monte Fantasia M.D.   On: 01/31/2017 13:16   Dg Abdomen Peg Tube Location  Result Date: 02/04/2017 CLINICAL DATA:  Gastrostomy catheter placement EXAM: ABDOMEN - 1 VIEW COMPARISON:  Study obtained earlier in the day. FINDINGS: Contrast, 50 mL Isovue-300, was injected into a gastrostomy catheter. The gastrostomy catheter is positioned in the gastric antrum. Contrast flows into the stomach without contrast extravasation. Bowel gas pattern is normal. No free air. IMPRESSION: Gastrostomy catheter positioning gastric antrum. No contrast extravasation evident. Electronically Signed   By: Lowella Grip III M.D.   On: 02/04/2017 17:09   Dg Chest Port 1 View  Result Date: 02/19/2017 CLINICAL DATA:  Sepsis, possible aspiration pneumonia. Patient admitted 02/17/2017. EXAM: PORTABLE CHEST 1 VIEW COMPARISON:  Single-view of the chest 02/18/2017 and 02/17/2017. FINDINGS: Right basilar atelectasis seen on yesterday's examination has resolved. Small focus of left basilar airspace disease is unchanged. There is no pneumothorax. Marked cardiomegaly without edema is seen. Tracheostomy tube is  in place. IMPRESSION: No change in small focus of left basilar airspace disease. Resolved right basilar atelectasis. Cardiomegaly without edema. Electronically Signed   By: Inge Rise M.D.   On: 02/19/2017 07:56   Dg Chest Port 1 View  Result Date: 02/18/2017 CLINICAL DATA:  Ventilator dependent EXAM: PORTABLE CHEST 1 VIEW COMPARISON:  01/10/2017, 02/17/2017 FINDINGS: Tracheostomy tube in satisfactory position. Mild bilateral interstitial prominence likely secondary to low lung volumes. Mild right basilar atelectasis. No significant pleural effusion or pneumothorax. Stable cardiomegaly. No acute osseous abnormality. IMPRESSION: Cardiomegaly. No active cardiopulmonary disease. Electronically Signed   By: Kathreen Devoid   On: 02/18/2017 16:21   Dg Chest Portable 1 View  Result Date: 02/17/2017 CLINICAL DATA:  Fever and tachypnea. EXAM: PORTABLE CHEST 1 VIEW COMPARISON:  02/04/2017 FINDINGS: Tracheostomy tube tip is above the carina. The heart size is mildly enlarged. There is mild interstitial edema. No airspace opacities. IMPRESSION: 1. Cardiac enlargement and mild edema. Electronically Signed   By: Kerby Moors M.D.   On: 02/17/2017 10:53   Dg Chest Port 1 View  Result Date: 02/04/2017 CLINICAL DATA:  Sepsis, shortness of breath. History of colon cancer, recent for percutaneous gastrostomy tube placement. EXAM: PORTABLE CHEST - 1 VIEW; PORTABLE ABDOMEN - 1 VIEW COMPARISON:  CT abdomen and pelvis January 25, 2017 inch chest radiograph January 25, 2017 FINDINGS: Cardiac silhouette is upper limits of normal, mediastinal silhouette is nonsuspicious for this low inspiratory examination with crowded vasculature markings. Patient rotated to the RIGHT, unfurling the aorta. RIGHT mid lung zone bandlike density. Pleural effusions or focal consolidations. Trachea projects midline and there is no pneumothorax. Tracheostomy tube tip projects 3 cm above the carina. Included soft tissue planes and osseous structures  are non-suspicious. Paucity of bowel gas on this single upright view. Single gas-filled nondistended loops below bowel in the  LEFT lower quadrant. No intra-abdominal mass effect or pathologic calcifications. No intraperitoneal free air. Soft tissue planes and included osseous structures are nonsuspicious. IMPRESSION: RIGHT midlung zone atelectasis/scarring. Borderline cardiomegaly. Nonspecific bowel gas pattern. Electronically Signed   By: Elon Alas M.D.   On: 02/04/2017 06:18   Dg Abd Portable 1v  Result Date: 02/24/2017 CLINICAL DATA:  Abdominal pain EXAM: PORTABLE ABDOMEN - 1 VIEW COMPARISON:  February 23, 2017 FINDINGS: There remains a loop of borderline dilated bowel in the mid abdomen. Elsewhere, there is somewhat of a paucity of gas. There is no air-fluid level. No free air. There are phleboliths in the pelvis. IMPRESSION: Essentially stable appearing loop of mildly dilated bowel in the mid abdomen. Generalized paucity of gas, stable. Suspect ileus with possible superimposed enteritis. Bowel obstruction felt to be less likely. No free air. Electronically Signed   By: Lowella Grip III M.D.   On: 02/24/2017 07:29   Dg Abd Portable 1v  Result Date: 02/23/2017 CLINICAL DATA:  Followup ileus. EXAM: PORTABLE ABDOMEN - 1 VIEW COMPARISON:  Yesterday. FINDINGS: The previously demonstrated dilated small bowel loop in the lower abdomen continues to have a maximum diameter of 4.3 cm. A paucity of intestinal gas is again demonstrated. Lumbar spine degenerative changes and bilateral pelvic phleboliths. IMPRESSION: Stable mild small bowel ileus. Electronically Signed   By: Claudie Revering M.D.   On: 02/23/2017 07:40   Dg Abd Portable 1v  Result Date: 02/22/2017 CLINICAL DATA:  Followup ileus.  Previous near total colectomy. EXAM: PORTABLE ABDOMEN - 1 VIEW COMPARISON:  02/20/2017. FINDINGS: Mildly dilated small bowel in the lower abdomen currently has a maximum diameter of 4.3 cm, previously 5.2 cm. Paucity  of gas in the mid and upper abdomen. No gross free peritoneal air. Lumbar and lower thoracic spine degenerative changes. IMPRESSION: Improving small bowel ileus. Electronically Signed   By: Claudie Revering M.D.   On: 02/22/2017 08:02   Dg Abd Portable 1v  Result Date: 02/20/2017 CLINICAL DATA:  Abdominal distention. Ileus. History of near total colectomy. EXAM: PORTABLE ABDOMEN - 1 VIEW COMPARISON:  02/18/2017 abdominal radiograph. 12/15/2013 CT abdomen/ pelvis. 01/25/2017 unenhanced CT abdomen. FINDINGS: Percutaneous gastrostomy catheter overlies the body of the stomach. Persistent gaseous distension of small bowel loop in the lower abdomen up to 5.2 cm diameter, not appreciably changed. No evidence of pneumatosis or pneumoperitoneum. No radiopaque urolithiasis. IMPRESSION: Stable dilated small bowel loop in the lower abdomen compatible with the provided history of adynamic ileus. No evidence of free air. Electronically Signed   By: Ilona Sorrel M.D.   On: 02/20/2017 10:30   Dg Abd Portable 1v  Result Date: 02/18/2017 CLINICAL DATA:  Hypoactive EXAM: PORTABLE ABDOMEN - 1 VIEW COMPARISON:  None. FINDINGS: Gaseous distended small bowel loops are noted mid lower abdomen suspicious for ileus or early bowel obstruction. IMPRESSION: Gaseous distended small bowel loops are noted mid lower abdomen suspicious for ileus or early bowel obstruction. Electronically Signed   By: Lahoma Crocker M.D.   On: 02/18/2017 20:22   Dg Abd Portable 1 View  Result Date: 02/04/2017 CLINICAL DATA:  Sepsis, shortness of breath. History of colon cancer, recent for percutaneous gastrostomy tube placement. EXAM: PORTABLE CHEST - 1 VIEW; PORTABLE ABDOMEN - 1 VIEW COMPARISON:  CT abdomen and pelvis January 25, 2017 inch chest radiograph January 25, 2017 FINDINGS: Cardiac silhouette is upper limits of normal, mediastinal silhouette is nonsuspicious for this low inspiratory examination with crowded vasculature markings. Patient rotated to the RIGHT,  unfurling  the aorta. RIGHT mid lung zone bandlike density. Pleural effusions or focal consolidations. Trachea projects midline and there is no pneumothorax. Tracheostomy tube tip projects 3 cm above the carina. Included soft tissue planes and osseous structures are non-suspicious. Paucity of bowel gas on this single upright view. Single gas-filled nondistended loops below bowel in the LEFT lower quadrant. No intra-abdominal mass effect or pathologic calcifications. No intraperitoneal free air. Soft tissue planes and included osseous structures are nonsuspicious. IMPRESSION: RIGHT midlung zone atelectasis/scarring. Borderline cardiomegaly. Nonspecific bowel gas pattern. Electronically Signed   By: Elon Alas M.D.   On: 02/04/2017 06:18   Dg Duanne Limerick W/water Sol Cm  Result Date: 01/28/2017 CLINICAL DATA:  Peg tube placed via endoscopy on 02/23, pulled out. Replaced yesterday with leakage. 72 French Foley placed today. EXAM: UPPER GI SERIES WITHOUT KUB TECHNIQUE: Routine upper GI series was performed with water-soluble barium. FLUOROSCOPY TIME:  Fluoroscopy Time:  42 seconds COMPARISON:  None. FINDINGS: 60 cc of Isovue-300 was administered via the indwelling PEG tube. Contrast filled the stomach. No extraluminal contrast leakage about the stomach or PEG tube. IMPRESSION: Contrast administration via the indwelling PEG tube demonstrating no extraluminal contrast leakage or other complicating feature. Electronically Signed   By: Franki Cabot M.D.   On: 01/28/2017 14:10   Dg Duanne Limerick W/water Sol Cm  Result Date: 01/27/2017 CLINICAL DATA:  Evaluate G-tube placement. EXAM: WATER SOLUBLE UPPER GI SERIES CONTRAST:  60 mL of Isovue-300 FLUOROSCOPY TIME:  Fluoroscopy Time:  36 seconds Number of Acquired Spot Images: 0 COMPARISON:  None. FINDINGS: The Foley catheter distal tip terminates in the stomach with an associated retention balloon. The stomach is normal in location and shape. There is normal emptying into the  duodenum. IMPRESSION: The Foley catheter/G tube has been appropriately placed in the stomach. Electronically Signed   By: Dorise Bullion III M.D   On: 01/27/2017 14:34    Lab Data:  CBC:  Recent Labs Lab 02/20/17 2138 02/21/17 1014 02/21/17 2151 02/22/17 0239 02/23/17 0309 02/24/17 0301  WBC 19.2* 21.1*  --  22.5* 25.1* 28.2*  HGB 7.3* 7.1* 8.7* 7.5* 8.1* 8.2*  HCT 22.9* 22.5* 27.4* 24.1* 24.7* 25.2*  MCV 87.1 86.9  --  88.6 83.7 83.2  PLT 245 261  --  255 189 177   Basic Metabolic Panel:  Recent Labs Lab 02/20/17 0711 02/21/17 0306 02/22/17 0239 02/23/17 0309 02/24/17 0301  NA 138 141 139 139 142  K 5.1 4.7 4.2 5.0 4.3  CL 105 109 107 111 111  CO2 18* 17* 14* 9* 12*  GLUCOSE 172* 160* 290* 207* 244*  BUN 65* 73* 79* 84* 90*  CREATININE 2.04* 2.22* 2.26* 2.56* 2.94*  CALCIUM 7.6* 7.8* 7.5* 7.6* 7.9*   GFR: Estimated Creatinine Clearance: 16.6 mL/min (A) (by C-G formula based on SCr of 2.94 mg/dL (H)). Liver Function Tests: No results for input(s): AST, ALT, ALKPHOS, BILITOT, PROT, ALBUMIN in the last 168 hours. No results for input(s): LIPASE, AMYLASE in the last 168 hours. No results for input(s): AMMONIA in the last 168 hours. Coagulation Profile:  Recent Labs Lab 02/17/17 1621  INR 1.39   Cardiac Enzymes:  Recent Labs Lab 02/17/17 1621  CKTOTAL 43   BNP (last 3 results) No results for input(s): PROBNP in the last 8760 hours. HbA1C: No results for input(s): HGBA1C in the last 72 hours. CBG:  Recent Labs Lab 02/23/17 2024 02/23/17 2251 02/24/17 0348 02/24/17 0820 02/24/17 1205  GLUCAP 179* 192* 214* 234* 250*  Lipid Profile: No results for input(s): CHOL, HDL, LDLCALC, TRIG, CHOLHDL, LDLDIRECT in the last 72 hours. Thyroid Function Tests: No results for input(s): TSH, T4TOTAL, FREET4, T3FREE, THYROIDAB in the last 72 hours. Anemia Panel: No results for input(s): VITAMINB12, FOLATE, FERRITIN, TIBC, IRON, RETICCTPCT in the last 72  hours. Urine analysis:    Component Value Date/Time   COLORURINE AMBER (A) 02/17/2017 1315   APPEARANCEUR HAZY (A) 02/17/2017 1315   LABSPEC 1.028 02/17/2017 1315   PHURINE 5.0 02/17/2017 1315   GLUCOSEU 50 (A) 02/17/2017 1315   HGBUR SMALL (A) 02/17/2017 1315   BILIRUBINUR SMALL (A) 02/17/2017 1315   KETONESUR NEGATIVE 02/17/2017 1315   PROTEINUR 30 (A) 02/17/2017 1315   NITRITE NEGATIVE 02/17/2017 1315   LEUKOCYTESUR NEGATIVE 02/17/2017 1315     Jomaira Darr M.D. [facility] 02/24/2017, 1:00 PM  Pager: [phone] Between 7am to 7pm - call Pager - [phone]  After 7pm go to [email or web address] - password [email or web address]  Call night coverage person covering after 7pm

## 2017-02-24 NOTE — Consult Note (Signed)
WOC consult performed last week, see notes and orders.  [place], [place]

## 2017-02-24 NOTE — Progress Notes (Signed)
Inpatient Diabetes Program Recommendations  AACE/ADA: New Consensus Statement on Inpatient Glycemic Control (2015)  Target Ranges:  Prepandial:   less than 140 mg/dL      Peak postprandial:   less than 180 mg/dL (1-2 hours)      Critically ill patients:  140 - 180 mg/dL   Lab Results  Component Value Date   GLUCAP 234 (H) 02/24/2017   HGBA1C 7.2 (H) 01/10/2017    Review of Glycemic Control  Diabetes history: DM2 Outpatient Diabetes medications: Tresiba 20 units daily @ HS, Novolog 8 units Q4H with tube feedings Current orders for Inpatient glycemic control: Started tube feedings 4/1 @1700 , sensitive correction scale Novolog 0-9 units Q4H   Inpatient Diabetes Program Recommendations:   Insulin - Basal: Please consider half of home basal dose of Lantus 10 units daily QHS  Insulin - Meal Coverage: Please consider tube feeding coverage of Novolog 2-3 units Q4H (hold if tube feeding held).  Thank you,  Windy Carina, RN, MSN Diabetes Coordinator Inpatient Diabetes Program [phone] (Team Pager)

## 2017-02-24 NOTE — Progress Notes (Signed)
Progress Note  Patient Name: April Franklin Date of Encounter: 02/24/2017  Primary Cardiologist: [place]  Subjective   Pt sleeping  Appears comfortable     Inpatient Medications    Scheduled Meds: . chlorhexidine  15 mL Mouth Rinse BID  . insulin aspart  0-9 Units Subcutaneous Q4H  . mouth rinse  15 mL Mouth Rinse q12n4p  . pantoprazole (PROTONIX) IV  40 mg Intravenous Q24H  . piperacillin-tazobactam (ZOSYN)  IV  2.25 g Intravenous Q6H  . sodium chloride flush  3 mL Intravenous Q12H   Continuous Infusions: . sodium chloride 50 mL/hr at 02/24/17 0300  . amiodarone 30 mg/hr (02/24/17 0508)  . feeding supplement (GLUCERNA 1.2 CAL) 1,500 mL (02/24/17 0530)  . heparin 1,750 Units/hr (02/24/17 0300)   PRN Meds: acetaminophen **OR** acetaminophen, albuterol, morphine injection, ondansetron **OR** ondansetron (ZOFRAN) IV   Vital Signs    Vitals:   02/23/17 2023 02/24/17 0347 02/24/17 0733 02/24/17 0814  BP: (!) 125/57 (!) 107/47 (!) 114/48 (!) 110/46  Pulse: 67 61  61  Resp: (!) 26 (!) 35  (!) 32  Temp: 98.4 F (36.9 C) 98.4 F (36.9 C)  97.9 F (36.6 C)  TempSrc: Axillary Oral  Axillary  SpO2: 99% 97%  94%  Weight:      Height:        Intake/Output Summary (Last 24 hours) at 02/24/17 1008 Last data filed at 02/24/17 0349  Gross per 24 hour  Intake           1896.7 ml  Output               50 ml  Net           1846.7 ml   Filed Weights   02/21/17 0500 02/22/17 0435 02/23/17 0349  Weight: 160 lb 15 oz (73 kg) 165 lb 5.5 oz (75 kg) 178 lb 12.7 oz (81.1 kg)    Telemetry        SR  - Personally Reviewed  ECG      Physical Exam  Chronically ill female  GEN: No acute distress.   Neck: Trach  Cardiac: RRR, no murmurs, rubs, or gallops.  Respiratory: Rhonchi anteriorly   GI: ABdomen supple   MS: Tr edema; No deformity. Neuro:  Does not respond   Labs    Chemistry Recent Labs Lab 02/17/17 1012  02/22/17 0239 02/23/17 0309 02/24/17 0301  NA 136  <  > 139 139 142  K 4.9  < > 4.2 5.0 4.3  CL 97*  < > 107 111 111  CO2 24  < > 14* 9* 12*  GLUCOSE 155*  < > 290* 207* 244*  BUN 24*  < > 79* 84* 90*  CREATININE 1.24*  < > 2.26* 2.56* 2.94*  CALCIUM 9.9  < > 7.5* 7.6* 7.9*  PROT 6.5  --   --   --   --   ALBUMIN 2.0*  --   --   --   --   AST 89*  --   --   --   --   ALT 65*  --   --   --   --   ALKPHOS 533*  --   --   --   --   BILITOT 1.9*  --   --   --   --   GFRNONAA 42*  < > 20* 17* 15*  GFRAA 48*  < > 23* 20* 17*  ANIONGAP 15  < >  18* 19* 19*  < > = values in this interval not displayed.   Hematology  Recent Labs Lab 02/22/17 0239 02/23/17 0309 02/24/17 0301  WBC 22.5* 25.1* 28.2*  RBC 2.72* 2.95* 3.03*  HGB 7.5* 8.1* 8.2*  HCT 24.1* 24.7* 25.2*  MCV 88.6 83.7 83.2  MCH 27.6 27.5 27.1  MCHC 31.1 32.8 32.5  RDW 19.8* 20.1* 20.1*  PLT 255 189 273    Cardiac EnzymesNo results for input(s): TROPONINI in the last 168 hours. No results for input(s): TROPIPOC in the last 168 hours.   BNPNo results for input(s): BNP, PROBNP in the last 168 hours.   DDimer No results for input(s): DDIMER in the last 168 hours.   Radiology    Dg Abd Portable 1v  Result Date: 02/24/2017 CLINICAL DATA:  Abdominal pain EXAM: PORTABLE ABDOMEN - 1 VIEW COMPARISON:  February 23, 2017 FINDINGS: There remains a loop of borderline dilated bowel in the mid abdomen. Elsewhere, there is somewhat of a paucity of gas. There is no air-fluid level. No free air. There are phleboliths in the pelvis. IMPRESSION: Essentially stable appearing loop of mildly dilated bowel in the mid abdomen. Generalized paucity of gas, stable. Suspect ileus with possible superimposed enteritis. Bowel obstruction felt to be less likely. No free air. Electronically Signed   By: Lowella Grip III M.D.   On: 02/24/2017 07:29   Dg Abd Portable 1v  Result Date: 02/23/2017 CLINICAL DATA:  Followup ileus. EXAM: PORTABLE ABDOMEN - 1 VIEW COMPARISON:  Yesterday. FINDINGS: The previously  demonstrated dilated small bowel loop in the lower abdomen continues to have a maximum diameter of 4.3 cm. A paucity of intestinal gas is again demonstrated. Lumbar spine degenerative changes and bilateral pelvic phleboliths. IMPRESSION: Stable mild small bowel ileus. Electronically Signed   By: Claudie Revering M.D.   On: 02/23/2017 07:40    Cardiac Studies   Duplex short segment DVT lef soleal vein Echo reviewed 01/10/17 EF 60-65% no valve disease normal LA size   Patient Profile     76 y.o. 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 chronic respiratory failure With trach recent DVT anemia admitted with fever worsening nausea and vomiting   Assessment & Plan    PAF:  Converted with amiodarone IV  With liver meds and elevated liver enzymes I do not think she is a candidate though for maintenance Rx  WOuld stop IV amio She is on heparin now  But not a candidate for po antiocoag   DVT  ON heparin  Renal  Cr rising  Getting IV hydration    Adenocarcinoma of colon with mets    Agree with palliative care being involved  From my initial interaction with pt and family today I am not sure family fully understands goals of care  Will continue to follow   Signed, Dorris Carnes, MD  02/24/2017, 10:08 AM

## 2017-02-24 NOTE — Progress Notes (Signed)
ANTICOAGULATION CONSULT NOTE - Follow-Up Pharmacy Consult for Heparin Indication: DVT  No Known Allergies  Patient Measurements: Height: 5\' 2"  (157.5 cm) Weight: 178 lb 12.7 oz (81.1 kg) IBW/kg (Calculated) : 50.1 Heparin Dosing Weight: 64 kg  Vital Signs: Temp: 97.9 F (36.6 C) (04/02 0814) Temp Source: Axillary (04/02 0814) BP: 110/46 (04/02 0814) Pulse Rate: 61 (04/02 0814)  Labs:  Recent Labs  02/22/17 0239 02/23/17 0309 02/23/17 0449 02/24/17 0301  HGB 7.5* 8.1*  --  8.2*  HCT 24.1* 24.7*  --  25.2*  PLT 255 189  --  273  HEPARINUNFRC 0.36  --  0.60 0.55  CREATININE 2.26* 2.56*  --  2.94*    Estimated Creatinine Clearance: 16.6 mL/min (A) (by C-G formula based on SCr of 2.94 mg/dL (H)).   Assessment: 22 YOF with metastatic colon CA who presented on 3/26 with fever and SIRS concerning for sepsis. The patient was also noted to have a new RLE DVT.   Heparin level remains therapeutic at current rate. Hgb remains low but no reported bleeding. Platelets stable. Of note, patient went into AFib with RVR and was started on amio gtt - now back in sinus rhythm.   She is also on Day #8 of empiric Zosyn. No positive micro data to guide therapy. Her regimen is adjusted appropriately for her renal function.  Goal of Therapy:  Heparin level 0.3-0.7 units/ml Monitor platelets by anticoagulation protocol: Yes    Plan:  1. Continue heparin drip at 1750 units/hr  2. Daily heparin level and CBC 3. Will continue to monitor for any signs/symptoms of bleeding 4. Continue Zosyn 2.25g IV q6h (infuse over 30 minutes). Consider establishing a stop date or deescalating ABX. 5. Follow renal function and clinical condition.  Legrand Como, Pharm.D., BCPS, AAHIVP Clinical Pharmacist Phone: [phone] or [phone] Pager: [phone] 02/24/2017, 9:30 AM

## 2017-02-25 ENCOUNTER — Encounter (HOSPITAL_COMMUNITY): Payer: Self-pay | Admitting: Diagnostic Radiology

## 2017-02-25 ENCOUNTER — Inpatient Hospital Stay (HOSPITAL_COMMUNITY): Payer: Medicare Other

## 2017-02-25 HISTORY — PX: IR REPLC GASTRO/COLONIC TUBE PERCUT W/FLUORO: IMG2333

## 2017-02-25 LAB — CBC
HEMATOCRIT: 26.8 % — AB (ref 36.0–46.0)
HEMOGLOBIN: 8.7 g/dL — AB (ref 12.0–15.0)
MCH: 27.1 pg (ref 26.0–34.0)
MCHC: 32.5 g/dL (ref 30.0–36.0)
MCV: 83.5 fL (ref 78.0–100.0)
PLATELETS: 304 10*3/uL (ref 150–400)
RBC: 3.21 MIL/uL — AB (ref 3.87–5.11)
RDW: 19.8 % — ABNORMAL HIGH (ref 11.5–15.5)
WBC: 28.3 10*3/uL — AB (ref 4.0–10.5)

## 2017-02-25 LAB — GLUCOSE, CAPILLARY
Glucose-Capillary: 208 mg/dL — ABNORMAL HIGH (ref 65–99)
Glucose-Capillary: 223 mg/dL — ABNORMAL HIGH (ref 65–99)
Glucose-Capillary: 257 mg/dL — ABNORMAL HIGH (ref 65–99)
Glucose-Capillary: 266 mg/dL — ABNORMAL HIGH (ref 65–99)
Glucose-Capillary: 270 mg/dL — ABNORMAL HIGH (ref 65–99)
Glucose-Capillary: 288 mg/dL — ABNORMAL HIGH (ref 65–99)

## 2017-02-25 LAB — HEPARIN LEVEL (UNFRACTIONATED): Heparin Unfractionated: 0.55 IU/mL (ref 0.30–0.70)

## 2017-02-25 MED ORDER — LIDOCAINE VISCOUS 2 % MT SOLN
OROMUCOSAL | Status: AC
  Filled 2017-02-25: qty 15

## 2017-02-25 MED ORDER — IOPAMIDOL (ISOVUE-300) INJECTION 61%
INTRAVENOUS | Status: AC
  Administered 2017-02-25: 10 mL
  Filled 2017-02-25: qty 50

## 2017-02-25 MED ORDER — FUROSEMIDE 10 MG/ML IJ SOLN
20.0000 mg | Freq: Two times a day (BID) | INTRAMUSCULAR | Status: DC
  Administered 2017-02-25: 20 mg via INTRAVENOUS
  Filled 2017-02-25 (×2): qty 2

## 2017-02-25 MED ORDER — AMIODARONE LOAD VIA INFUSION
150.0000 mg | Freq: Once | INTRAVENOUS | Status: AC
  Administered 2017-02-25: 150 mg via INTRAVENOUS

## 2017-02-25 MED ORDER — AMIODARONE HCL IN DEXTROSE 360-4.14 MG/200ML-% IV SOLN
30.0000 mg/h | INTRAVENOUS | Status: DC
  Administered 2017-02-25 – 2017-02-27 (×5): 30 mg/h via INTRAVENOUS
  Filled 2017-02-25 (×6): qty 200

## 2017-02-25 NOTE — Progress Notes (Signed)
Physician notified: Rai At: 5329  Regarding: HR sustained 13-145, afib. RR 47 shallow. s/p bathing. Ordering 12 lead EKG. Thanks.  Awaiting return response.   Returned Response at: 1042  Order(s): Restarting amio gtt. RN awaiting orders for proper treatment.

## 2017-02-25 NOTE — Progress Notes (Signed)
Inpatient Diabetes Program Recommendations  AACE/ADA: New Consensus Statement on Inpatient Glycemic Control (2015)  Target Ranges:  Prepandial:   less than 140 mg/dL      Peak postprandial:   less than 180 mg/dL (1-2 hours)      Critically ill patients:  140 - 180 mg/dL   Lab Results  Component Value Date   GLUCAP 223 (H) 02/25/2017   HGBA1C 7.2 (H) 01/10/2017   Results for April Franklin, April Franklin (MRN [id number]) as of 02/25/2017 11:03  Ref. Range 02/24/2017 08:20 02/24/2017 12:05 02/24/2017 17:10 02/24/2017 20:03 02/24/2017 23:09 02/25/2017 04:06 02/25/2017 07:56  Glucose-Capillary Latest Ref Range: 65 - 99 mg/dL 234 (H) 250 (H) 249 (H) 288 (H) 241 (H) 266 (H) 223 (H)   Noted that lowest CBG in last 24 hours was 223 mg/dL.  Review of Glycemic Control  Diabetes history: DM2  Outpatient Diabetes medications: Tresiba 20 units daily @ HS, Novolog 8 units Q4H with tube feedings  Current orders for Inpatient glycemic control: Started tube feedings 4/1 @1700 , sensitive correction scale Novolog 0-9 units Q4H   Inpatient Diabetes Program Recommendations:   Insulin - Basal: Please consider half of home basal dose of Lantus 10 units daily QHS.  Insulin - Meal Coverage: Please consider tube feeding coverage of Novolog 2-3 units Q4H (hold if tube feeding held).  Text page sent to Dr. Tana Coast.  Thank you,  Windy Carina, RN, MSN Diabetes Coordinator Inpatient Diabetes Program [phone] (Team Pager)

## 2017-02-25 NOTE — Progress Notes (Signed)
ANTICOAGULATION CONSULT NOTE - Follow-Up Pharmacy Consult for Heparin Indication: DVT  No Known Allergies  Patient Measurements: Height: 5\' 2"  (157.5 cm) Weight: 177 lb 4 oz (80.4 kg) IBW/kg (Calculated) : 50.1 Heparin Dosing Weight: 64 kg  Vital Signs: Temp: 99.8 F (37.7 C) (04/03 0700) Temp Source: Axillary (04/03 0700) BP: 113/49 (04/03 0700) Pulse Rate: 85 (04/03 0807)  Labs:  Recent Labs  02/23/17 0309 02/23/17 0449 02/24/17 0301 02/25/17 0252  HGB 8.1*  --  8.2* 8.7*  HCT 24.7*  --  25.2* 26.8*  PLT 189  --  273 304  HEPARINUNFRC  --  0.60 0.55 0.55  CREATININE 2.56*  --  2.94*  --     Estimated Creatinine Clearance: 16.5 mL/min (A) (by C-G formula based on SCr of 2.94 mg/dL (H)).   Assessment: 27 YOF with metastatic colon CA who presented on 3/26 with fever and SIRS concerning for sepsis. The patient was also noted to have a new RLE DVT.   Heparin level remains therapeutic at current rate. Hgb remains low but no reported bleeding. Platelets stable.   She is also on Day #9 of empiric Zosyn. No positive micro data to guide therapy. Her regimen is adjusted appropriately for her renal function.  Goal of Therapy:  Heparin level 0.3-0.7 units/ml Monitor platelets by anticoagulation protocol: Yes    Plan:  1. Continue heparin drip at 1750 units/hr  2. Daily heparin level and CBC 3. Will continue to monitor for any signs/symptoms of bleeding 4. Continue Zosyn 2.25g IV q6h (infuse over 30 minutes). Consider establishing a stop date or deescalating ABX. 5. Follow renal function and clinical condition.  Heide Guile, PharmD, BCPS-AQ ID Clinical Pharmacist Pager [phone] Phone - [phone] 02/25/2017, 10:01 AM

## 2017-02-25 NOTE — Progress Notes (Signed)
Progress Note  Patient Name: April Franklin Date of Encounter: 02/25/2017  Primary Cardiologist: [place]    Subjective   PT does not respond to talking    Inpatient Medications    Scheduled Meds: . chlorhexidine  15 mL Mouth Rinse BID  . insulin aspart  0-9 Units Subcutaneous Q4H  . mouth rinse  15 mL Mouth Rinse q12n4p  . pantoprazole (PROTONIX) IV  40 mg Intravenous Q24H  . piperacillin-tazobactam (ZOSYN)  IV  2.25 g Intravenous Q6H  . sodium chloride flush  3 mL Intravenous Q12H   Continuous Infusions: . feeding supplement (GLUCERNA 1.2 CAL) Stopped (02/25/17 0827)  . heparin 1,750 Units/hr (02/25/17 0700)   PRN Meds: acetaminophen **OR** acetaminophen, albuterol, morphine injection, ondansetron **OR** ondansetron (ZOFRAN) IV   Vital Signs    Vitals:   02/25/17 0404 02/25/17 0418 02/25/17 0700 02/25/17 0807  BP: (!) 132/48  (!) 113/49   Pulse: 79  83 85  Resp: (!) 38  (!) 44 (!) 45  Temp: 99.2 F (37.3 C)  99.8 F (37.7 C)   TempSrc: Axillary  Axillary   SpO2: 93%  94% 93%  Weight:  177 lb 4 oz (80.4 kg)    Height:        Intake/Output Summary (Last 24 hours) at 02/25/17 1024 Last data filed at 02/25/17 0827  Gross per 24 hour  Intake          1367.83 ml  Output              100 ml  Net          1267.83 ml   Filed Weights   02/22/17 0435 02/23/17 0349 02/25/17 0418  Weight: 165 lb 5.5 oz (75 kg) 178 lb 12.7 oz (81.1 kg) 177 lb 4 oz (80.4 kg)    Telemetry    Atrail fib  With RVR   130   - Personally Reviewed    Physical Exam   GEN: Breathng fast  Eyes closed  Occasional grimace  .   Neck: Neck is full   Cardiac: RRR, no murmurs, rubs, or gallops.  Respiratory: Clear to auscultation bilaterally.  Shallow BS   GI: Soft, nontender, non-distended  MS: No edema; No deformity. Neuro:  Nonfocal  Psych: Normal affect   Labs    Chemistry Recent Labs Lab 02/22/17 0239 02/23/17 0309 02/24/17 0301  NA 139 139 142  K 4.2 5.0 4.3  CL 107 111  111  CO2 14* 9* 12*  GLUCOSE 290* 207* 244*  BUN 79* 84* 90*  CREATININE 2.26* 2.56* 2.94*  CALCIUM 7.5* 7.6* 7.9*  GFRNONAA 20* 17* 15*  GFRAA 23* 20* 17*  ANIONGAP 18* 19* 19*     Hematology Recent Labs Lab 02/23/17 0309 02/24/17 0301 02/25/17 0252  WBC 25.1* 28.2* 28.3*  RBC 2.95* 3.03* 3.21*  HGB 8.1* 8.2* 8.7*  HCT 24.7* 25.2* 26.8*  MCV 83.7 83.2 83.5  MCH 27.5 27.1 27.1  MCHC 32.8 32.5 32.5  RDW 20.1* 20.1* 19.8*  PLT 189 273 304    Cardiac EnzymesNo results for input(s): TROPONINI in the last 168 hours. No results for input(s): TROPIPOC in the last 168 hours.   BNPNo results for input(s): BNP, PROBNP in the last 168 hours.   DDimer No results for input(s): DDIMER in the last 168 hours.   Radiology    Dg Chest Port 1 View  Result Date: 02/24/2017 CLINICAL DATA:  Dyspnea, tracheostomy patient, recent CVA. History of colonic malignancy  EXAM: PORTABLE CHEST 1 VIEW COMPARISON:  Portable chest x-ray dated February 19, 2017 FINDINGS: The patient is rotated on today's study. The lungs are hypoinflated. There is mildly increased density in the right paratracheal region as compared to the previous study. Elsewhere there is subtle patchy density at the left lung base which is stable. The heart is top-normal in size. The pulmonary vascularity is normal. The tracheostomy appliance tip projects just inferior to the inferior margin of the clavicular heads. The bony structures exhibit no acute abnormalities. IMPRESSION: Bilateral hypoinflation. There may be atelectasis or early pneumonia in the right upper lobe medially new since the previous study. Stable coarse lung markings at the left lung base. Follow-up radiographs are recommended. Electronically Signed   By: David  [place] M.D.   On: 02/24/2017 14:41   Dg Abd Portable 1v  Result Date: 02/24/2017 CLINICAL DATA:  Abdominal pain EXAM: PORTABLE ABDOMEN - 1 VIEW COMPARISON:  February 23, 2017 FINDINGS: There remains a loop of borderline  dilated bowel in the mid abdomen. Elsewhere, there is somewhat of a paucity of gas. There is no air-fluid level. No free air. There are phleboliths in the pelvis. IMPRESSION: Essentially stable appearing loop of mildly dilated bowel in the mid abdomen. Generalized paucity of gas, stable. Suspect ileus with possible superimposed enteritis. Bowel obstruction felt to be less likely. No free air. Electronically Signed   By: Lowella Grip III M.D.   On: 02/24/2017 07:29    Cardiac Studies     Patient Profile    76 y.o. 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    Adding back will not change long term prognosis  2  DVT  Continues on heparin    Signed, Dorris Carnes, MD  02/25/2017, 10:24 AM

## 2017-02-25 NOTE — Progress Notes (Addendum)
Physician notified: Rai At: 1149  Regarding: Pt only has amio infusing due to one PIV. PLease call re: possible PICC placement per IV team.   Physician notified: Rai At: 1609  Regarding: Pt return from IR. OK to use g tube and restart TF? rate? Need OK from MD for IV team to place PICC with BUN >30  Orders placed by Dr Tana Coast

## 2017-02-25 NOTE — Progress Notes (Signed)
[facility]                                                                              Patient Demographics  April Franklin, is a 76 y.o. female, DOB - [birth year], NOT:[id number]  Admit date - 02/17/2017   Admitting Physician Waldemar Dickens, MD  Outpatient Primary MD for the patient is [name], MD  Outpatient specialists:   LOS - 8  days    Chief Complaint  Patient presents with  . 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 Chief complaint was leaking G-tube with intermittent fevers.  X ray shows ileus vs early obstruction.  Patient also had propagation of lower extremity DVT.   Overall prognosis very poor and family has vascillated with regards to her overall GOC. Currently pt is DNR.  Palliative care has met with family again and family wishes to pursue treatment of treatable conditions.  There are multiple family members involved in patient's care.     Assessment & Plan    Principal Problem: SIRS/Sepsis Likely aspirating - Low-grade fevers with tachypnea, tachycardia, leukocytosis, elevated lactic acid, acute kidney injury, encephalopathy.  - Patient was placed on IV fluid resuscitation and broad-spectrum antibiotics, currently on IV Zosyn.  - C. difficile negative, MRSA PCR screening negative, no UTI.   - Chest x-ray 4/2 showed bilateral hypo-inflation, atelectasis or early pneumonia in the right upper lobe new since the previous study. Still leukocytosis.  - Continue IV Zosyn  Active problems Acute kidney injury. Likely related to dehydration secondary to decreased G-tube intake and emesis. Creatinine 1.24 on  admission.  -Creatinine trending up with positive balance of 13 L, IV fluids discontinued and placed on IV Lasix.   -Hold nephrotoxins  Abdominal distention with ileus with history of near total colectomy from adenocarcinoma colon/metastatic -  Abdominal x-ray 4/2 showed stable ileus - patient was started on trickle tube feeds, however overnight PEG tube came out and Foley was placed in the track. - I have requested IR for PEG tube placement   Acute on chronic respiratory failure. Status post tracheostomy 2 hospitalizations ago. At that time, was also treated for aspiration pneumonia. Patient with tachypnea but no hypoxia. Chest x-ray as noted above. ABG with pH of 7.51, PCO2 26.7, PO2 87 bicarb 21.7. -See #1, continue antitussives, scopolamine, O2 supplementation as indicated, on IV Zosyn  Likely aspirating, continue IV Zosyn   Acute on chronic encephalopathy. Likely related to above in setting of recent stroke. Recent CT showed progressing of acute infarct on the left.  - Previously followed by neurology and discharged on 3/9 by Dr. [name], multiple medical issues, overall poor prognosis - Currently not following any commands, palliative care has been following - Aspirin stopped last hospitalization   Hypertension. Home meds include losartan and beta blocker. -Currently BP soft, starting Lasix 20 mg q12hrs  Diabetes. Serum glucose 155 on admission. Recent hemoglobin A1c is 7.2. Home medications include Lantus -Hold Lantus for now -Sliding scale insulin for optimal control  Lower extremity edema. right worse than left  - DVT 2 hospitalizations ago.  Duplex had shown worsening DVT, patient is not ambulatory and hypercoagulable from adenocarcinoma of colon with metastasis - Anticoagulation stopped last hospitalization due to anemia. Synovial fluid aspirated in ED, cultures negative   Anemia- - suspect decrease in Hgb from volume dilution. Unclear source. During previous  hospitalization, Dr [name] had discussed case with GI who did not recommend endoscopic evaluation given significant cardiovascular risk involved - follow closely, will transfuse 1 unit pRBC  Recent NSTEMI - no echo done since increased troponin - no intervention possible due to patient's poor prognosis/current state - Cardiology following  Sacral Pressure Injury 02/17/17 appears unstageable per nusing -WOC consult  Paroxysmal Atrial Fib with RVR  - on 3/30, patient went into rapid atrial fibrillation with RVR, cardiology was consulted and recommended starting IV amiodarone. Patient subsequently converted to normal sinus rhythm and heart rate remained controlled. Amiodarone was discontinued on 02/24/17 due to liver metastases, and elevated liver enzymes it was a temporary measure. -  However, today again patient back in rapid atrial fib with RVR, heart rate was in 140s, BP in 80s.  -I discussed in detail with patient's daughters at the bedside that amiodarone is only a temporary measure and will not change her overall prognosis which still remains poor. Patient's daughters stated that they all understand it and will continue to rely on their faith and when it is God's will. However they will continue to treat what is fixable, they take turns to take care of her at home.  - currently on IV heparin. Per cardiology however she is not a candidate for oral anticoagulation  Code Status: dnr  DVT Prophylaxis:  Heparin drip Family Communication: Discussed in detail with the patient, all imaging results, lab results explained to the patient's daughters at the bedside   Disposition Plan:  Unclear  Time Spent in minutes  25 minutes  Procedures:  Abdominal x-ray cxr   Consultants:   Palliative care Cardiology   Antimicrobials:   Zosyn 3/27 >>   Medications  Scheduled Meds: . chlorhexidine  15 mL Mouth Rinse BID  . insulin aspart  0-9 Units Subcutaneous Q4H  . mouth rinse  15 mL Mouth  Rinse q12n4p  . pantoprazole (PROTONIX) IV  40 mg Intravenous Q24H  . piperacillin-tazobactam (ZOSYN)  IV  2.25 g Intravenous Q6H  . sodium chloride flush  3 mL Intravenous Q12H   Continuous Infusions: . amiodarone 30 mg/hr (02/25/17 1058)  . feeding supplement (GLUCERNA 1.2 CAL) Stopped (02/25/17 0827)  . heparin Stopped (02/25/17 1057)   PRN Meds:.acetaminophen **OR** acetaminophen, albuterol, morphine injection, ondansetron **OR** ondansetron (ZOFRAN) IV   Antibiotics   Anti-infectives    Start     Dose/Rate Route Frequency Ordered Stop   02/21/17 0830  piperacillin-tazobactam (ZOSYN) IVPB 2.25 g     2.25 g 100 mL/hr over 30 Minutes Intravenous Every 6 hours 02/21/17 0805     02/21/17 0815  piperacillin-tazobactam (ZOSYN) IVPB 2.25 g  Status:  Discontinued     2.25 g 100 mL/hr over 30 Minutes Intravenous Every 6 hours 02/21/17 0802 02/21/17 0805   02/18/17 1200  vancomycin (VANCOCIN) IVPB 1000 mg/200 mL premix     1,000 mg 200 mL/hr over 60 Minutes Intravenous Every 24 hours 02/17/17 1108 02/19/17 1210   02/17/17  1600  piperacillin-tazobactam (ZOSYN) IVPB 3.375 g  Status:  Discontinued     3.375 g 12.5 mL/hr over 240 Minutes Intravenous Every 8 hours 02/17/17 1108 02/21/17 0801   02/17/17 1330  piperacillin-tazobactam (ZOSYN) IVPB 3.375 g  Status:  Discontinued     3.375 g 100 mL/hr over 30 Minutes Intravenous  Once 02/17/17 1317 02/17/17 1322   02/17/17 1330  vancomycin (VANCOCIN) IVPB 1000 mg/200 mL premix  Status:  Discontinued     1,000 mg 200 mL/hr over 60 Minutes Intravenous  Once 02/17/17 1317 02/17/17 1322   02/17/17 1000  piperacillin-tazobactam (ZOSYN) IVPB 3.375 g     3.375 g 100 mL/hr over 30 Minutes Intravenous  Once 02/17/17 0945 02/17/17 1038   02/17/17 1000  vancomycin (VANCOCIN) IVPB 1000 mg/200 mL premix  Status:  Discontinued     1,000 mg 200 mL/hr over 60 Minutes Intravenous  Once 02/17/17 0945 02/17/17 0953   02/17/17 1000  vancomycin (VANCOCIN) 1,250  mg in sodium chloride 0.9 % 250 mL IVPB     1,250 mg 166.7 mL/hr over 90 Minutes Intravenous  Once 02/17/17 0953 02/17/17 1306        Subjective:   April Franklin was seen and examined today.  Unresponsive to any verbal commands.  Heart rates back in atrial fibrillation with RVR, shallow and course breathing   Objective:   Vitals:   02/25/17 1217 02/25/17 1230 02/25/17 1300 02/25/17 1330  BP:  (!) 125/59 (!) 113/59 (!) 107/52  Pulse: 78 78 74 73  Resp: (!) 51 (!) 51 (!) 53 (!) 44  Temp:      TempSrc:      SpO2: 96% 96% 97% 96%  Weight:      Height:        Intake/Output Summary (Last 24 hours) at 02/25/17 1408 Last data filed at 02/25/17 1100  Gross per 24 hour  Intake          1267.83 ml  Output               75 ml  Net          1192.83 ml     Wt Readings from Last 3 Encounters:  02/25/17 80.4 kg (177 lb 4 oz)  02/04/17 65.2 kg (143 lb 11.2 oz)  01/31/17 63.2 kg (139 lb 5.3 oz)     Exam  General:Unresponsive, not responding to any verbal commands  HEENT:     Neck: Supple, trach +  Cardiovascular: S1 S2 clear, RRR  Respiratory: coarse breath sounds throughout  Gastrointestinal : Soft, nontender  Ext: no cyanosis clubbing or edema  Neuro: does not cooperate   Skin: No rashes  Psych:  not responding to any verbal commands  Data Reviewed:  I have personally reviewed following labs and imaging studies  Micro Results Recent Results (from the past 240 hour(s))  Blood Culture (routine x 2)     Status: None   Collection Time: 02/17/17 10:13 AM  Result Value Ref Range Status   Specimen Description BLOOD LEFT ANTECUBITAL  Final   Special Requests IN PEDIATRIC BOTTLE 4CC  Final   Culture NO GROWTH 5 DAYS  Final   Report Status 02/22/2017 FINAL  Final  Blood Culture (routine x 2)     Status: None   Collection Time: 02/17/17 10:15 AM  Result Value Ref Range Status   Specimen Description BLOOD LEFT HAND  Final   Special Requests AEROBIC BOTTLE ONLY 6CC   Final   Culture NO  GROWTH 5 DAYS  Final   Report Status 02/22/2017 FINAL  Final  Body fluid culture     Status: None   Collection Time: 02/17/17 12:48 PM  Result Value Ref Range Status   Specimen Description SYNOVIAL RIGHT KNEE  Final   Special Requests NONE  Final   Gram Stain   Final    MODERATE WBC PRESENT, PREDOMINANTLY MONONUCLEAR RARE WBC PRESENT, PREDOMINANTLY PMN NO ORGANISMS SEEN    Culture NO GROWTH 3 DAYS  Final   Report Status 02/21/2017 FINAL  Final  MRSA PCR Screening     Status: None   Collection Time: 02/18/17  5:54 PM  Result Value Ref Range Status   MRSA by PCR NEGATIVE NEGATIVE Final    Comment:        The GeneXpert MRSA Assay (FDA approved for NASAL specimens only), is one component of a comprehensive MRSA colonization surveillance program. It is not intended to diagnose MRSA infection nor to guide or monitor treatment for MRSA infections.   C difficile quick scan w PCR reflex     Status: None   Collection Time: 02/23/17  1:47 PM  Result Value Ref Range Status   C Diff antigen NEGATIVE NEGATIVE Final   C Diff toxin NEGATIVE NEGATIVE Final   C Diff interpretation No C. difficile detected.  Final    Radiology Reports Ct Head Wo Contrast  Result Date: 02/04/2017 CLINICAL DATA:  Sepsis.  Unresponsive for several days. EXAM: CT HEAD WITHOUT CONTRAST TECHNIQUE: Contiguous axial images were obtained from the base of the skull through the vertex without intravenous contrast. COMPARISON:  01/31/2017 FINDINGS: Brain: Diffuse cerebral atrophy. Mild ventricular dilatation consistent with central atrophy. Low-attenuation changes in the deep white matter with old lacunar infarcts consistent with small vessel ischemic change. Area of developing encephalomalacia in the right occipital lobe and right greater than left cerebellar hemispheres consistent with evolving infarcts. There is increased low-attenuation change in sulcal effacement in the left occipital lobe since  previous study suggesting developing subacute infarct. The area of involvement appears more prominent than on the recent MRI. No midline shift. No abnormal extra-axial fluid collections. Gray-white matter junctions are distinct. Basal cisterns are not effaced. No acute intracranial hemorrhage. Vascular: Vascular calcifications are present. Skull: Calvarium appears intact. Sinuses/Orbits: Visualized paranasal sinuses and mastoid air cells are not opacified. Other: None. IMPRESSION: Developing low-attenuation and sulcal effacement in the left occipital lobe suggesting progressing acute infarct. Expected evolutionary changes of previous infarct in the right occipital region and bilateral cerebellum. No acute intracranial hemorrhage or significant mass effect. Electronically Signed   By: Lucienne Capers M.D.   On: 02/04/2017 06:42   Ct Head Wo Contrast  Result Date: 01/31/2017 CLINICAL DATA:  Change in mental status. EXAM: CT HEAD WITHOUT CONTRAST TECHNIQUE: Contiguous axial images were obtained from the base of the skull through the vertex without intravenous contrast. COMPARISON:  01/10/2017 FINDINGS: Brain: Expected progressive low-density and loss of mass-effect in the areas of late subacute infarct seen previously. The most confluent infarcts are in the right cerebellum and right occipital lobe, where there is also fading petechial hemorrhage. Small infarcts along the cerebral watershed at the vertex are largely underestimated relative to prior MRI. Small patchy subacute infarcts in the left cerebellum. Improved patency of the fourth ventricle. No hydrocephalus. No detected new infarct. Vascular: No asymmetric hyperdense vessel. Atherosclerotic calcification. Skull: No acute or aggressive finding Sinuses/Orbits: Gaze to the left. IMPRESSION: 1. No acute finding. 2. Expected evolution of recent extensive  cerebral and cerebellar infarction. No evidence of infarct progression. Known petechial hemorrhage without  progression. 3. Decrease cytotoxic edema in the posterior fossa with normalized fourth ventricle. Electronically Signed   By: Monte Fantasia M.D.   On: 01/31/2017 13:16   Dg Abdomen Peg Tube Location  Result Date: 02/04/2017 CLINICAL DATA:  Gastrostomy catheter placement EXAM: ABDOMEN - 1 VIEW COMPARISON:  Study obtained earlier in the day. FINDINGS: Contrast, 50 mL Isovue-300, was injected into a gastrostomy catheter. The gastrostomy catheter is positioned in the gastric antrum. Contrast flows into the stomach without contrast extravasation. Bowel gas pattern is normal. No free air. IMPRESSION: Gastrostomy catheter positioning gastric antrum. No contrast extravasation evident. Electronically Signed   By: Lowella Grip III M.D.   On: 02/04/2017 17:09   Dg Chest Port 1 View  Result Date: 02/24/2017 CLINICAL DATA:  Dyspnea, tracheostomy patient, recent CVA. History of colonic malignancy EXAM: PORTABLE CHEST 1 VIEW COMPARISON:  Portable chest x-ray dated February 19, 2017 FINDINGS: The patient is rotated on today's study. The lungs are hypoinflated. There is mildly increased density in the right paratracheal region as compared to the previous study. Elsewhere there is subtle patchy density at the left lung base which is stable. The heart is top-normal in size. The pulmonary vascularity is normal. The tracheostomy appliance tip projects just inferior to the inferior margin of the clavicular heads. The bony structures exhibit no acute abnormalities. IMPRESSION: Bilateral hypoinflation. There may be atelectasis or early pneumonia in the right upper lobe medially new since the previous study. Stable coarse lung markings at the left lung base. Follow-up radiographs are recommended. Electronically Signed   By: David  Martinique M.D.   On: 02/24/2017 14:41   Dg Chest Port 1 View  Result Date: 02/19/2017 CLINICAL DATA:  Sepsis, possible aspiration pneumonia. Patient admitted 02/17/2017. EXAM: PORTABLE CHEST 1 VIEW  COMPARISON:  Single-view of the chest 02/18/2017 and 02/17/2017. FINDINGS: Right basilar atelectasis seen on yesterday's examination has resolved. Small focus of left basilar airspace disease is unchanged. There is no pneumothorax. Marked cardiomegaly without edema is seen. Tracheostomy tube is in place. IMPRESSION: No change in small focus of left basilar airspace disease. Resolved right basilar atelectasis. Cardiomegaly without edema. Electronically Signed   By: Inge Rise M.D.   On: 02/19/2017 07:56   Dg Chest Port 1 View  Result Date: 02/18/2017 CLINICAL DATA:  Ventilator dependent EXAM: PORTABLE CHEST 1 VIEW COMPARISON:  01/10/2017, 02/17/2017 FINDINGS: Tracheostomy tube in satisfactory position. Mild bilateral interstitial prominence likely secondary to low lung volumes. Mild right basilar atelectasis. No significant pleural effusion or pneumothorax. Stable cardiomegaly. No acute osseous abnormality. IMPRESSION: Cardiomegaly. No active cardiopulmonary disease. Electronically Signed   By: Kathreen Devoid   On: 02/18/2017 16:21   Dg Chest Portable 1 View  Result Date: 02/17/2017 CLINICAL DATA:  Fever and tachypnea. EXAM: PORTABLE CHEST 1 VIEW COMPARISON:  02/04/2017 FINDINGS: Tracheostomy tube tip is above the carina. The heart size is mildly enlarged. There is mild interstitial edema. No airspace opacities. IMPRESSION: 1. Cardiac enlargement and mild edema. Electronically Signed   By: Kerby Moors M.D.   On: 02/17/2017 10:53   Dg Chest Port 1 View  Result Date: 02/04/2017 CLINICAL DATA:  Sepsis, shortness of breath. History of colon cancer, recent for percutaneous gastrostomy tube placement. EXAM: PORTABLE CHEST - 1 VIEW; PORTABLE ABDOMEN - 1 VIEW COMPARISON:  CT abdomen and pelvis January 25, 2017 inch chest radiograph January 25, 2017 FINDINGS: Cardiac silhouette is upper limits of normal,  mediastinal silhouette is nonsuspicious for this low inspiratory examination with crowded vasculature  markings. Patient rotated to the RIGHT, unfurling the aorta. RIGHT mid lung zone bandlike density. Pleural effusions or focal consolidations. Trachea projects midline and there is no pneumothorax. Tracheostomy tube tip projects 3 cm above the carina. Included soft tissue planes and osseous structures are non-suspicious. Paucity of bowel gas on this single upright view. Single gas-filled nondistended loops below bowel in the LEFT lower quadrant. No intra-abdominal mass effect or pathologic calcifications. No intraperitoneal free air. Soft tissue planes and included osseous structures are nonsuspicious. IMPRESSION: RIGHT midlung zone atelectasis/scarring. Borderline cardiomegaly. Nonspecific bowel gas pattern. Electronically Signed   By: Elon Alas M.D.   On: 02/04/2017 06:18   Dg Abd Portable 1v  Result Date: 02/24/2017 CLINICAL DATA:  Abdominal pain EXAM: PORTABLE ABDOMEN - 1 VIEW COMPARISON:  February 23, 2017 FINDINGS: There remains a loop of borderline dilated bowel in the mid abdomen. Elsewhere, there is somewhat of a paucity of gas. There is no air-fluid level. No free air. There are phleboliths in the pelvis. IMPRESSION: Essentially stable appearing loop of mildly dilated bowel in the mid abdomen. Generalized paucity of gas, stable. Suspect ileus with possible superimposed enteritis. Bowel obstruction felt to be less likely. No free air. Electronically Signed   By: Lowella Grip III M.D.   On: 02/24/2017 07:29   Dg Abd Portable 1v  Result Date: 02/23/2017 CLINICAL DATA:  Followup ileus. EXAM: PORTABLE ABDOMEN - 1 VIEW COMPARISON:  Yesterday. FINDINGS: The previously demonstrated dilated small bowel loop in the lower abdomen continues to have a maximum diameter of 4.3 cm. A paucity of intestinal gas is again demonstrated. Lumbar spine degenerative changes and bilateral pelvic phleboliths. IMPRESSION: Stable mild small bowel ileus. Electronically Signed   By: Claudie Revering M.D.   On: 02/23/2017 07:40    Dg Abd Portable 1v  Result Date: 02/22/2017 CLINICAL DATA:  Followup ileus.  Previous near total colectomy. EXAM: PORTABLE ABDOMEN - 1 VIEW COMPARISON:  02/20/2017. FINDINGS: Mildly dilated small bowel in the lower abdomen currently has a maximum diameter of 4.3 cm, previously 5.2 cm. Paucity of gas in the mid and upper abdomen. No gross free peritoneal air. Lumbar and lower thoracic spine degenerative changes. IMPRESSION: Improving small bowel ileus. Electronically Signed   By: Claudie Revering M.D.   On: 02/22/2017 08:02   Dg Abd Portable 1v  Result Date: 02/20/2017 CLINICAL DATA:  Abdominal distention. Ileus. History of near total colectomy. EXAM: PORTABLE ABDOMEN - 1 VIEW COMPARISON:  02/18/2017 abdominal radiograph. 12/15/2013 CT abdomen/ pelvis. 01/25/2017 unenhanced CT abdomen. FINDINGS: Percutaneous gastrostomy catheter overlies the body of the stomach. Persistent gaseous distension of small bowel loop in the lower abdomen up to 5.2 cm diameter, not appreciably changed. No evidence of pneumatosis or pneumoperitoneum. No radiopaque urolithiasis. IMPRESSION: Stable dilated small bowel loop in the lower abdomen compatible with the provided history of adynamic ileus. No evidence of free air. Electronically Signed   By: Ilona Sorrel M.D.   On: 02/20/2017 10:30   Dg Abd Portable 1v  Result Date: 02/18/2017 CLINICAL DATA:  Hypoactive EXAM: PORTABLE ABDOMEN - 1 VIEW COMPARISON:  None. FINDINGS: Gaseous distended small bowel loops are noted mid lower abdomen suspicious for ileus or early bowel obstruction. IMPRESSION: Gaseous distended small bowel loops are noted mid lower abdomen suspicious for ileus or early bowel obstruction. Electronically Signed   By: Lahoma Crocker M.D.   On: 02/18/2017 20:22   Dg Abd Portable 1  View  Result Date: 02/04/2017 CLINICAL DATA:  Sepsis, shortness of breath. History of colon cancer, recent for percutaneous gastrostomy tube placement. EXAM: PORTABLE CHEST - 1 VIEW; PORTABLE  ABDOMEN - 1 VIEW COMPARISON:  CT abdomen and pelvis January 25, 2017 inch chest radiograph January 25, 2017 FINDINGS: Cardiac silhouette is upper limits of normal, mediastinal silhouette is nonsuspicious for this low inspiratory examination with crowded vasculature markings. Patient rotated to the RIGHT, unfurling the aorta. RIGHT mid lung zone bandlike density. Pleural effusions or focal consolidations. Trachea projects midline and there is no pneumothorax. Tracheostomy tube tip projects 3 cm above the carina. Included soft tissue planes and osseous structures are non-suspicious. Paucity of bowel gas on this single upright view. Single gas-filled nondistended loops below bowel in the LEFT lower quadrant. No intra-abdominal mass effect or pathologic calcifications. No intraperitoneal free air. Soft tissue planes and included osseous structures are nonsuspicious. IMPRESSION: RIGHT midlung zone atelectasis/scarring. Borderline cardiomegaly. Nonspecific bowel gas pattern. Electronically Signed   By: Elon Alas M.D.   On: 02/04/2017 06:18   Dg Duanne Limerick W/water Sol Cm  Result Date: 01/28/2017 CLINICAL DATA:  Peg tube placed via endoscopy on 02/23, pulled out. Replaced yesterday with leakage. 3 French Foley placed today. EXAM: UPPER GI SERIES WITHOUT KUB TECHNIQUE: Routine upper GI series was performed with water-soluble barium. FLUOROSCOPY TIME:  Fluoroscopy Time:  42 seconds COMPARISON:  None. FINDINGS: 60 cc of Isovue-300 was administered via the indwelling PEG tube. Contrast filled the stomach. No extraluminal contrast leakage about the stomach or PEG tube. IMPRESSION: Contrast administration via the indwelling PEG tube demonstrating no extraluminal contrast leakage or other complicating feature. Electronically Signed   By: Franki Cabot M.D.   On: 01/28/2017 14:10   Dg Duanne Limerick W/water Sol Cm  Result Date: 01/27/2017 CLINICAL DATA:  Evaluate G-tube placement. EXAM: WATER SOLUBLE UPPER GI SERIES CONTRAST:  60 mL of  Isovue-300 FLUOROSCOPY TIME:  Fluoroscopy Time:  36 seconds Number of Acquired Spot Images: 0 COMPARISON:  None. FINDINGS: The Foley catheter distal tip terminates in the stomach with an associated retention balloon. The stomach is normal in location and shape. There is normal emptying into the duodenum. IMPRESSION: The Foley catheter/G tube has been appropriately placed in the stomach. Electronically Signed   By: Dorise Bullion III M.D   On: 01/27/2017 14:34    Lab Data:  CBC:  Recent Labs Lab 02/21/17 1014 02/21/17 2151 02/22/17 0239 02/23/17 0309 02/24/17 0301 02/25/17 0252  WBC 21.1*  --  22.5* 25.1* 28.2* 28.3*  HGB 7.1* 8.7* 7.5* 8.1* 8.2* 8.7*  HCT 22.5* 27.4* 24.1* 24.7* 25.2* 26.8*  MCV 86.9  --  88.6 83.7 83.2 83.5  PLT 261  --  255 189 273 417   Basic Metabolic Panel:  Recent Labs Lab 02/20/17 0711 02/21/17 0306 02/22/17 0239 02/23/17 0309 02/24/17 0301  NA 138 141 139 139 142  K 5.1 4.7 4.2 5.0 4.3  CL 105 109 107 111 111  CO2 18* 17* 14* 9* 12*  GLUCOSE 172* 160* 290* 207* 244*  BUN 65* 73* 79* 84* 90*  CREATININE 2.04* 2.22* 2.26* 2.56* 2.94*  CALCIUM 7.6* 7.8* 7.5* 7.6* 7.9*   GFR: Estimated Creatinine Clearance: 16.5 mL/min (A) (by C-G formula based on SCr of 2.94 mg/dL (H)). Liver Function Tests: No results for input(s): AST, ALT, ALKPHOS, BILITOT, PROT, ALBUMIN in the last 168 hours. No results for input(s): LIPASE, AMYLASE in the last 168 hours. No results for input(s): AMMONIA in the last  168 hours. Coagulation Profile: No results for input(s): INR, PROTIME in the last 168 hours. Cardiac Enzymes: No results for input(s): CKTOTAL, CKMB, CKMBINDEX, TROPONINI in the last 168 hours. BNP (last 3 results) No results for input(s): PROBNP in the last 8760 hours. HbA1C: No results for input(s): HGBA1C in the last 72 hours. CBG:  Recent Labs Lab 02/24/17 2003 02/24/17 2309 02/25/17 0406 02/25/17 0756 02/25/17 1227  GLUCAP 288* 241* 266* 223*  288*   Lipid Profile: No results for input(s): CHOL, HDL, LDLCALC, TRIG, CHOLHDL, LDLDIRECT in the last 72 hours. Thyroid Function Tests: No results for input(s): TSH, T4TOTAL, FREET4, T3FREE, THYROIDAB in the last 72 hours. Anemia Panel: No results for input(s): VITAMINB12, FOLATE, FERRITIN, TIBC, IRON, RETICCTPCT in the last 72 hours. Urine analysis:    Component Value Date/Time   COLORURINE AMBER (A) 02/17/2017 1315   APPEARANCEUR HAZY (A) 02/17/2017 1315   LABSPEC 1.028 02/17/2017 1315   PHURINE 5.0 02/17/2017 1315   GLUCOSEU 50 (A) 02/17/2017 1315   HGBUR SMALL (A) 02/17/2017 1315   BILIRUBINUR SMALL (A) 02/17/2017 1315   KETONESUR NEGATIVE 02/17/2017 1315   PROTEINUR 30 (A) 02/17/2017 1315   NITRITE NEGATIVE 02/17/2017 1315   LEUKOCYTESUR NEGATIVE 02/17/2017 1315     Khamauri Bauernfeind M.D. [facility] 02/25/2017, 2:08 PM  Pager: [phone] Between 7am to 7pm - call Pager - [phone]  After 7pm go to [email or web address] - password [email or web address]  Call night coverage person covering after 7pm

## 2017-02-25 NOTE — Progress Notes (Signed)
Upon arrival patient seemed agitated and family was at bedside attempting to help calm pt. RT listen to breath sounds which were clear diminished. Talked to family about trachel suctioning and family stated since breath sounds didn't indicate rhonchi to not suction patient at this time to help patient calm down and relax. RT made family aware that if she changed her mind and would like [place] to come back and suction at anytime to call. Pt vitals stable. RT will continue to monitor.

## 2017-02-25 NOTE — Care Management Important Message (Signed)
Important Message  Patient Details  Name: April Franklin MRN: [id number] Date of Birth: 1941/05/06   Medicare Important Message Given:  Yes    [name] 02/25/2017, 2:22 PM

## 2017-02-26 ENCOUNTER — Encounter (HOSPITAL_COMMUNITY): Payer: Self-pay | Admitting: Interventional Radiology

## 2017-02-26 ENCOUNTER — Inpatient Hospital Stay (HOSPITAL_COMMUNITY): Payer: Medicare Other

## 2017-02-26 HISTORY — PX: IR FLUORO GUIDE CV LINE RIGHT: IMG2283

## 2017-02-26 HISTORY — PX: IR US GUIDE VASC ACCESS RIGHT: IMG2390

## 2017-02-26 LAB — GLUCOSE, CAPILLARY
GLUCOSE-CAPILLARY: 238 mg/dL — AB (ref 65–99)
GLUCOSE-CAPILLARY: 224 mg/dL — AB (ref 65–99)
Glucose-Capillary: 207 mg/dL — ABNORMAL HIGH (ref 65–99)
Glucose-Capillary: 302 mg/dL — ABNORMAL HIGH (ref 65–99)
Glucose-Capillary: 325 mg/dL — ABNORMAL HIGH (ref 65–99)

## 2017-02-26 LAB — CBC
HEMATOCRIT: 25.8 % — AB (ref 36.0–46.0)
Hemoglobin: 8.1 g/dL — ABNORMAL LOW (ref 12.0–15.0)
MCH: 26.5 pg (ref 26.0–34.0)
MCHC: 31.4 g/dL (ref 30.0–36.0)
MCV: 84.3 fL (ref 78.0–100.0)
Platelets: 238 10*3/uL (ref 150–400)
RBC: 3.06 MIL/uL — ABNORMAL LOW (ref 3.87–5.11)
RDW: 20.2 % — AB (ref 11.5–15.5)
WBC: 26.4 10*3/uL — AB (ref 4.0–10.5)

## 2017-02-26 LAB — BASIC METABOLIC PANEL
Anion gap: 18 — ABNORMAL HIGH (ref 5–15)
BUN: 102 mg/dL — AB (ref 6–20)
CO2: 12 mmol/L — AB (ref 22–32)
CREATININE: 3.69 mg/dL — AB (ref 0.44–1.00)
Calcium: 8.2 mg/dL — ABNORMAL LOW (ref 8.9–10.3)
Chloride: 113 mmol/L — ABNORMAL HIGH (ref 101–111)
GFR calc Af Amer: 13 mL/min — ABNORMAL LOW (ref >60)
GFR, EST NON AFRICAN AMERICAN: 11 mL/min — AB (ref >60)
GLUCOSE: 207 mg/dL — AB (ref 65–99)
Potassium: 3.9 mmol/L (ref 3.5–5.1)
Sodium: 143 mmol/L (ref 135–145)

## 2017-02-26 LAB — HEPARIN LEVEL (UNFRACTIONATED)
Heparin Unfractionated: <0.1 IU/mL — ABNORMAL LOW (ref 0.30–0.70)
Heparin Unfractionated: 0.56 IU/mL (ref 0.30–0.70)

## 2017-02-26 MED ORDER — LIDOCAINE HCL (PF) 1 % IJ SOLN
INTRAMUSCULAR | Status: AC
  Filled 2017-02-26: qty 30

## 2017-02-26 NOTE — Progress Notes (Signed)
Called by family member to suction patient.

## 2017-02-26 NOTE — Progress Notes (Signed)
ANTICOAGULATION CONSULT NOTE - Follow-Up Pharmacy Consult for Heparin Indication: DVT  No Known Allergies  Patient Measurements: Height: 5\' 2"  (157.5 cm) Weight: 178 lb 5.6 oz (80.9 kg) IBW/kg (Calculated) : 50.1 Heparin Dosing Weight: 64 kg  Vital Signs: Temp: 97.6 F (36.4 C) (04/04 2318) Temp Source: Oral (04/04 2318) BP: 107/45 (04/04 2318) Pulse Rate: 69 (04/04 2318)  Labs:  Recent Labs  02/24/17 0301 02/25/17 0252 02/26/17 0251 02/26/17 2150  HGB 8.2* 8.7* 8.1*  --   HCT 25.2* 26.8* 25.8*  --   PLT 273 304 238  --   HEPARINUNFRC 0.55 0.55 <0.10* 0.56  CREATININE 2.94*  --  3.69*  --     Estimated Creatinine Clearance: 13.2 mL/min (A) (by C-G formula based on SCr of 3.69 mg/dL (H)).   Assessment: April Franklin with metastatic colon CA who presented on 3/26 with fever and SIRS concerning for sepsis. The patient was also noted to have a new RLE DVT.   Heparin level now therapeutic (0.56) on gtt at 1750 units/hr. No bleeding noted.  Goal of Therapy:  Heparin level 0.3-0.7 units/ml Monitor platelets by anticoagulation protocol: Yes    Plan:  Continue heparin infusion at 1750 units/hr Daily heparin level and CBC  Sherlon Handing, PharmD, BCPS Clinical pharmacist, pager [phone] 02/26/2017, 11:54 PM

## 2017-02-26 NOTE — Progress Notes (Signed)
ANTICOAGULATION CONSULT NOTE - Follow-Up Pharmacy Consult for Heparin Indication: DVT  No Known Allergies  Patient Measurements: Height: 5\' 2"  (157.5 cm) Weight: 178 lb 5.6 oz (80.9 kg) IBW/kg (Calculated) : 50.1 Heparin Dosing Weight: 64 kg  Vital Signs: Temp: 98.6 F (37 C) (04/04 1100) Temp Source: Axillary (04/04 1100) BP: 108/52 (04/04 1136) Pulse Rate: 71 (04/04 1136)  Labs:  Recent Labs  02/24/17 0301 02/25/17 0252 02/26/17 0251  HGB 8.2* 8.7* 8.1*  HCT 25.2* 26.8* 25.8*  PLT 273 304 238  HEPARINUNFRC 0.55 0.55 <0.10*  CREATININE 2.94*  --  3.69*    Estimated Creatinine Clearance: 13.2 mL/min (A) (by C-G formula based on SCr of 3.69 mg/dL (H)).   Assessment: 43 YOF with metastatic colon CA who presented on 3/26 with fever and SIRS concerning for sepsis. The patient was also noted to have a new RLE DVT.   Heparin level was undetectable this morning - heparin gtt off since 4/3 1100. She only has one IV and heparin/amiodarone are not compatible and cardiology felt amiodarone was more important. She had a PICC placed today and heparin has been restarted.   Goal of Therapy:  Heparin level 0.3-0.7 units/ml Monitor platelets by anticoagulation protocol: Yes    Plan:  Heparin infusion at 1750 units/hr Check heparin level 8 hours after restarting Daily heparin level and CBC Monitor for bleeding complications  Legrand Como, Pharm.D., BCPS, AAHIVP Clinical Pharmacist Phone: [phone] or [phone] 02/26/2017, 2:42 PM

## 2017-02-26 NOTE — Consult Note (Signed)
[place] Nurse wound consult note Reason for Consult: re consulted for progression of present on admission deep tissue injury (DTI) and Stage 2 pressure injury Wound type: Area is now one are, Unstageable, evolution of DTI Pressure Injury POA: Yes Measurement:14cm x 11cm x 0cm Wound bed:25% pink at wound edges, partial thickness skin loss, 75% dark purple black adherent non viable tissue. Drainage (amount, consistency, odor) none Periwound: intact  Dressing procedure/placement/frequency: Continue soft silicone foam dressing, change every 3 days and PRN soilage.  Assess under dressing each shift for acute changes in the wound.  Add low air loss mattress for moisture management and pressure redistribution. WOC requested secretary to order Bowel management and FC in place for containment of stool and urine.  Discussed with daughter at the bedside that the skin is the largest organ of the body and that when other organs are not working well such as the case with Mrs. Bayona (kidneys, lungs, liver) the skin can also fail, despite all care provided.  Nutritional status is being managed by RD, however with kidney function the addition of more protein is not feasible with NPO status. I have advised the daughter that pressure injuries like the one that Mrs. Toomey has are tissue damage from the bone up and that we are seeing the typical evolution of a deep tissue injury like the one that the patient presented with from home.  The daughter has questioned if there would be a "cream" that we could use now after I have explained the use of enzymatic debridement ointment, however in the current status of this wound it would not do anything for her at this time.  Should the wound progress to a true eschar we may be able to start the debridement agent, however once we start that process there is a likelihood that the wound will be deep and possibly to the coccyx bone. I have requested the bedside nurses to notify me of any  acute changes in the wound.  I will monitor the wound status weekly and I have explained that to the daughter as well.   [place] Nurse team will follow along with you for weekly wound assessments.  Please notify me of any acute changes in the wounds or any new areas of concerns [place] MSN, [place], CNS [phone]

## 2017-02-26 NOTE — Progress Notes (Signed)
[facility]                                                                              Patient Demographics  April Franklin, is a 76 y.o. female, DOB - 11/20/1941, XYI:[id number]  Admit date - 02/17/2017   Admitting Physician Waldemar Dickens, MD  Outpatient Primary MD for the patient is [name], MD  Outpatient specialists:   LOS - 9  days    Chief Complaint  Patient presents with  . 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 Chief complaint was leaking G-tube with intermittent fevers.  X ray shows ileus vs early obstruction.  Patient also had propagation of lower extremity DVT.   Overall prognosis very poor and family has vascillated with regards to her overall GOC. Currently pt is DNR.  Palliative care has met with family again and family wishes to pursue treatment of treatable conditions.  There are multiple family members involved in patient's care.     Assessment & Plan    ETHICS: -this is a 75/F with adenocA colon with liver mets, recent multiple hospitalization with strokes with PEG and Trach, bed bound, non verbal and poorly responsive at baseline admitted with N/vomiting, seen by Palliative care last few admissions, family resistant to Full Comfort care, they are still hopeful for recovery and a miracle despite being told regularly by medical professionals that she is dying  SIRS/Sepsis likely due to aspiration or sacral decub wounds - Low-grade fevers with tachypnea, tachycardia, leukocytosis, elevated lactic acid, acute kidney injury, encephalopathy.  - Patient was placed on IV fluid resuscitation and  broad-spectrum antibiotics, currently on IV Zosyn.  - C. difficile negative, MRSA PCR screening negative, no UTI.   - Chest x-ray 4/2 showed bilateral hypo-inflation, atelectasis or early pneumonia in the right upper lobe new since the previous study. Still has profound leukocytosis.  - Continue IV Zosyn  Acute kidney injury.  -due to sepsis and third spacing -Creatinine trending up with positive balance of 13 L, IV fluids discontinued and placed on IV Lasix, will hold this now due to rise in craetinine -Hold nephrotoxins, off Vanc  Abdominal distention with ileus with history of near total colectomy from adenocarcinoma colon/metastatic -  Abdominal x-ray 4/2 showed stable ileus - patient was started on trickle tube feeds, however overnight PEG tube came out and Foley was placed in the track. - IDr.Rai consulted IR for PEG tube placement   Acute on chronic respiratory failure. Status post tracheostomy 2 hospitalizations ago. At that time, was also treated for aspiration pneumonia. Patient with tachypnea but no hypoxia. Chest x-ray as noted above. ABG with pH of 7.51, PCO2 26.7, PO2 87 bicarb 21.7. -See #1, continue antitussives, scopolamine, O2 supplementation as indicated, on IV Zosyn  Likely aspirating, continue IV Zosyn -also on IV lasix-now held   Acute on chronic encephalopathy.  Likely related to above in setting of recent stroke. Recent CT showed progressing of acute infarct on the left.  - Previously followed by neurology and discharged on 3/9 by Dr. 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 Home meds include losartan and beta blocker. -now held   Diabetes. Serum glucose 155 on admission. Recent hemoglobin A1c is 7.2. Home medications include Lantus -Hold Lantus for  now -Sliding scale insulin for optimal control  Lower extremity edema. right worse than left  - DVT 2 hospitalizations ago.  Duplex had shown worsening DVT, patient is not ambulatory and hypercoagulable from adenocarcinoma of colon with metastasis - Anticoagulation stopped last hospitalization due to anemia. Synovial fluid aspirated in ED, cultures negative   Anemia- - suspect decrease in Hgb from volume dilution. Unclear source. During previous hospitalization, Dr [name] had discussed case with GI who did not recommend endoscopic evaluation given significant cardiovascular risk involved - follow closely, will transfuse 1 unit pRBC  Recent NSTEMI - no echo done since increased troponin - no intervention possible due to patient's poor prognosis/current state - Cardiology following  Sacral Pressure Injury 02/17/17 appears unstageable per nusing -WOC consult  Paroxysmal Atrial Fib with RVR  - on 3/30, patient went into rapid atrial fibrillation with RVR, cardiology was consulted and recommended starting IV amiodarone. Patient subsequently converted to normal sinus rhythm and heart rate remained controlled. Amiodarone was discontinued on 02/24/17 due to liver metastases, and elevated liver enzymes it was a temporary measure. -  However, then back again patient back in rapid atrial fib with RVR, heart rate was in 140s, BP in 80s.  -I discussed in detail with patient's daughters at the bedside that amiodarone is only a temporary measure and will not change her overall prognosis which still remains poor. Patient's daughters stated that they all understand it and will continue to rely on their faith and when it is God's will. However they will continue to treat what is fixable, they take turns to take care of her at home.  - currently on IV heparin. Per cardiology however she is not a candidate for oral anticoagulation -now in NSR  Code Status: dnr  DVT Prophylaxis:  Heparin drip Family  Communication: Discussed in detail with the patient, all imaging results, lab results explained to the patient's daughter at the bedside   Disposition Plan:  Unclear  Time Spent in minutes  45 minutes  Procedures:  Abdominal x-ray cxr   Consultants:   Palliative care Cardiology   Antimicrobials:   Zosyn 3/27 >>   Medications  Scheduled Meds: . chlorhexidine  15 mL Mouth Rinse BID  . insulin aspart  0-9 Units Subcutaneous Q4H  . mouth rinse  15 mL Mouth Rinse q12n4p  . pantoprazole (PROTONIX) IV  40 mg Intravenous Q24H  . piperacillin-tazobactam (ZOSYN)  IV  2.25 g Intravenous Q6H  . sodium chloride flush  3 mL Intravenous Q12H   Continuous Infusions: . amiodarone 30 mg/hr (02/26/17 0504)  . feeding supplement (GLUCERNA 1.2 CAL) 1,500 mL (02/26/17 0325)  . heparin Stopped (02/25/17 1057)   PRN Meds:.acetaminophen **OR** acetaminophen, albuterol, morphine injection, ondansetron **OR** ondansetron (ZOFRAN) IV   Antibiotics   Anti-infectives    Start     Dose/Rate Route Frequency Ordered Stop   02/21/17 0830  piperacillin-tazobactam (ZOSYN) IVPB 2.25 g  2.25 g 100 mL/hr over 30 Minutes Intravenous Every 6 hours 02/21/17 0805     02/21/17 0815  piperacillin-tazobactam (ZOSYN) IVPB 2.25 g  Status:  Discontinued     2.25 g 100 mL/hr over 30 Minutes Intravenous Every 6 hours 02/21/17 0802 02/21/17 0805   02/18/17 1200  vancomycin (VANCOCIN) IVPB 1000 mg/200 mL premix     1,000 mg 200 mL/hr over 60 Minutes Intravenous Every 24 hours 02/17/17 1108 02/19/17 1210   02/17/17 1600  piperacillin-tazobactam (ZOSYN) IVPB 3.375 g  Status:  Discontinued     3.375 g 12.5 mL/hr over 240 Minutes Intravenous Every 8 hours 02/17/17 1108 02/21/17 0801   02/17/17 1330  piperacillin-tazobactam (ZOSYN) IVPB 3.375 g  Status:  Discontinued     3.375 g 100 mL/hr over 30 Minutes Intravenous  Once 02/17/17 1317 02/17/17 1322   02/17/17 1330  vancomycin (VANCOCIN) IVPB 1000 mg/200 mL premix   Status:  Discontinued     1,000 mg 200 mL/hr over 60 Minutes Intravenous  Once 02/17/17 1317 02/17/17 1322   02/17/17 1000  piperacillin-tazobactam (ZOSYN) IVPB 3.375 g     3.375 g 100 mL/hr over 30 Minutes Intravenous  Once 02/17/17 0945 02/17/17 1038   02/17/17 1000  vancomycin (VANCOCIN) IVPB 1000 mg/200 mL premix  Status:  Discontinued     1,000 mg 200 mL/hr over 60 Minutes Intravenous  Once 02/17/17 0945 02/17/17 0953   02/17/17 1000  vancomycin (VANCOCIN) 1,250 mg in sodium chloride 0.9 % 250 mL IVPB     1,250 mg 166.7 mL/hr over 90 Minutes Intravenous  Once 02/17/17 0953 02/17/17 1306        Subjective:   Doree Kuehne was seen and examined today.  Unresponsive to any verbal commands.    Objective:   Vitals:   02/26/17 0700 02/26/17 0750 02/26/17 1100 02/26/17 1136  BP:  (!) 106/48  (!) 108/52  Pulse:  65  71  Resp:  (!) 32  (!) 34  Temp: 98 F (36.7 C)  98.6 F (37 C)   TempSrc: Axillary  Axillary   SpO2:  98%  98%  Weight:      Height:        Intake/Output Summary (Last 24 hours) at 02/26/17 1140 Last data filed at 02/26/17 1111  Gross per 24 hour  Intake            257.8 ml  Output              150 ml  Net            107.8 ml     Wt Readings from Last 3 Encounters:  02/26/17 80.9 kg (178 lb 5.6 oz)  02/04/17 65.2 kg (143 lb 11.2 oz)  01/31/17 63.2 kg (139 lb 5.3 oz)     Exam  General:Unresponsive, not responding to any verbal commands  Neck: Supple, trach +  Cardiovascular: S1 S2 clear, RRR  Respiratory: coarse breath sounds throughout  Gastrointestinal : Soft, nontender  Ext: no cyanosis clubbing or edema  Neuro: does not cooperate   Skin: No rashes  Psych:  not responding to any verbal commands  Data Reviewed:  I have personally reviewed following labs and imaging studies  Micro Results Recent Results (from the past 240 hour(s))  Blood Culture (routine x 2)     Status: None   Collection Time: 02/17/17 10:13 AM  Result Value  Ref Range Status   Specimen Description BLOOD LEFT ANTECUBITAL  Final   Special Requests  IN PEDIATRIC BOTTLE 4CC  Final   Culture NO GROWTH 5 DAYS  Final   Report Status 02/22/2017 FINAL  Final  Blood Culture (routine x 2)     Status: None   Collection Time: 02/17/17 10:15 AM  Result Value Ref Range Status   Specimen Description BLOOD LEFT HAND  Final   Special Requests AEROBIC BOTTLE ONLY 6CC  Final   Culture NO GROWTH 5 DAYS  Final   Report Status 02/22/2017 FINAL  Final  Body fluid culture     Status: None   Collection Time: 02/17/17 12:48 PM  Result Value Ref Range Status   Specimen Description SYNOVIAL RIGHT KNEE  Final   Special Requests NONE  Final   Gram Stain   Final    MODERATE WBC PRESENT, PREDOMINANTLY MONONUCLEAR RARE WBC PRESENT, PREDOMINANTLY PMN NO ORGANISMS SEEN    Culture NO GROWTH 3 DAYS  Final   Report Status 02/21/2017 FINAL  Final  MRSA PCR Screening     Status: None   Collection Time: 02/18/17  5:54 PM  Result Value Ref Range Status   MRSA by PCR NEGATIVE NEGATIVE Final    Comment:        The GeneXpert MRSA Assay (FDA approved for NASAL specimens only), is one component of a comprehensive MRSA colonization surveillance program. It is not intended to diagnose MRSA infection nor to guide or monitor treatment for MRSA infections.   C difficile quick scan w PCR reflex     Status: None   Collection Time: 02/23/17  1:47 PM  Result Value Ref Range Status   C Diff antigen NEGATIVE NEGATIVE Final   C Diff toxin NEGATIVE NEGATIVE Final   C Diff interpretation No C. difficile detected.  Final    Radiology Reports Ct Head Wo Contrast  Result Date: 02/04/2017 CLINICAL DATA:  Sepsis.  Unresponsive for several days. EXAM: CT HEAD WITHOUT CONTRAST TECHNIQUE: Contiguous axial images were obtained from the base of the skull through the vertex without intravenous contrast. COMPARISON:  01/31/2017 FINDINGS: Brain: Diffuse cerebral atrophy. Mild ventricular  dilatation consistent with central atrophy. Low-attenuation changes in the deep white matter with old lacunar infarcts consistent with small vessel ischemic change. Area of developing encephalomalacia in the right occipital lobe and right greater than left cerebellar hemispheres consistent with evolving infarcts. There is increased low-attenuation change in sulcal effacement in the left occipital lobe since previous study suggesting developing subacute infarct. The area of involvement appears more prominent than on the recent MRI. No midline shift. No abnormal extra-axial fluid collections. Gray-white matter junctions are distinct. Basal cisterns are not effaced. No acute intracranial hemorrhage. Vascular: Vascular calcifications are present. Skull: Calvarium appears intact. Sinuses/Orbits: Visualized paranasal sinuses and mastoid air cells are not opacified. Other: None. IMPRESSION: Developing low-attenuation and sulcal effacement in the left occipital lobe suggesting progressing acute infarct. Expected evolutionary changes of previous infarct in the right occipital region and bilateral cerebellum. No acute intracranial hemorrhage or significant mass effect. Electronically Signed   By: Lucienne Capers M.D.   On: 02/04/2017 06:42   Ct Head Wo Contrast  Result Date: 01/31/2017 CLINICAL DATA:  Change in mental status. EXAM: CT HEAD WITHOUT CONTRAST TECHNIQUE: Contiguous axial images were obtained from the base of the skull through the vertex without intravenous contrast. COMPARISON:  01/10/2017 FINDINGS: Brain: Expected progressive low-density and loss of mass-effect in the areas of late subacute infarct seen previously. The most confluent infarcts are in the right cerebellum and right occipital lobe, where  there is also fading petechial hemorrhage. Small infarcts along the cerebral watershed at the vertex are largely underestimated relative to prior MRI. Small patchy subacute infarcts in the left cerebellum.  Improved patency of the fourth ventricle. No hydrocephalus. No detected new infarct. Vascular: No asymmetric hyperdense vessel. Atherosclerotic calcification. Skull: No acute or aggressive finding Sinuses/Orbits: Gaze to the left. IMPRESSION: 1. No acute finding. 2. Expected evolution of recent extensive cerebral and cerebellar infarction. No evidence of infarct progression. Known petechial hemorrhage without progression. 3. Decrease cytotoxic edema in the posterior fossa with normalized fourth ventricle. Electronically Signed   By: Monte Fantasia M.D.   On: 01/31/2017 13:16   Ir Replc Gastro/colonic Tube Percut W/fluoro  Result Date: 02/25/2017 INDICATION: Gastrostomy tube was dislodged and patient has a Foley catheter in place. EXAM: REPLACEMENT OF GASTROSTOMY TUBE WITH FLUOROSCOPY FLUOROSCOPY TIME:  12 seconds, 1 mGy COMPLICATIONS: None immediate. MEDICATIONS: None ANESTHESIA/SEDATION: None CONTRAST:  10 mL Isovue-300 - administered into the gastric lumen. PROCEDURE: Procedure was performed by the IR technologist. Foley catheter was removed and a new 15 French balloon retention catheter was inserted. Balloon was inflated with 9 ml of saline. Contrast injection was performed. Catheter was flushed. Fluoroscopic images were taken and saved for this procedure. FINDINGS: New 49 French gastrostomy tube is positioned in the stomach. IMPRESSION: Replacement of gastrostomy tube. Electronically Signed   By: Markus Daft M.D.   On: 02/25/2017 15:17   Dg Abdomen Peg Tube Location  Result Date: 02/04/2017 CLINICAL DATA:  Gastrostomy catheter placement EXAM: ABDOMEN - 1 VIEW COMPARISON:  Study obtained earlier in the day. FINDINGS: Contrast, 50 mL Isovue-300, was injected into a gastrostomy catheter. The gastrostomy catheter is positioned in the gastric antrum. Contrast flows into the stomach without contrast extravasation. Bowel gas pattern is normal. No free air. IMPRESSION: Gastrostomy catheter positioning gastric  antrum. No contrast extravasation evident. Electronically Signed   By: Lowella Grip III M.D.   On: 02/04/2017 17:09   Dg Chest Port 1 View  Result Date: 02/24/2017 CLINICAL DATA:  Dyspnea, tracheostomy patient, recent CVA. History of colonic malignancy EXAM: PORTABLE CHEST 1 VIEW COMPARISON:  Portable chest x-ray dated February 19, 2017 FINDINGS: The patient is rotated on today's study. The lungs are hypoinflated. There is mildly increased density in the right paratracheal region as compared to the previous study. Elsewhere there is subtle patchy density at the left lung base which is stable. The heart is top-normal in size. The pulmonary vascularity is normal. The tracheostomy appliance tip projects just inferior to the inferior margin of the clavicular heads. The bony structures exhibit no acute abnormalities. IMPRESSION: Bilateral hypoinflation. There may be atelectasis or early pneumonia in the right upper lobe medially new since the previous study. Stable coarse lung markings at the left lung base. Follow-up radiographs are recommended. Electronically Signed   By: David  Martinique M.D.   On: 02/24/2017 14:41   Dg Chest Port 1 View  Result Date: 02/19/2017 CLINICAL DATA:  Sepsis, possible aspiration pneumonia. Patient admitted 02/17/2017. EXAM: PORTABLE CHEST 1 VIEW COMPARISON:  Single-view of the chest 02/18/2017 and 02/17/2017. FINDINGS: Right basilar atelectasis seen on yesterday's examination has resolved. Small focus of left basilar airspace disease is unchanged. There is no pneumothorax. Marked cardiomegaly without edema is seen. Tracheostomy tube is in place. IMPRESSION: No change in small focus of left basilar airspace disease. Resolved right basilar atelectasis. Cardiomegaly without edema. Electronically Signed   By: Inge Rise M.D.   On: 02/19/2017 07:56  Dg Chest Port 1 View  Result Date: 02/18/2017 CLINICAL DATA:  Ventilator dependent EXAM: PORTABLE CHEST 1 VIEW COMPARISON:   01/10/2017, 02/17/2017 FINDINGS: Tracheostomy tube in satisfactory position. Mild bilateral interstitial prominence likely secondary to low lung volumes. Mild right basilar atelectasis. No significant pleural effusion or pneumothorax. Stable cardiomegaly. No acute osseous abnormality. IMPRESSION: Cardiomegaly. No active cardiopulmonary disease. Electronically Signed   By: Kathreen Devoid   On: 02/18/2017 16:21   Dg Chest Portable 1 View  Result Date: 02/17/2017 CLINICAL DATA:  Fever and tachypnea. EXAM: PORTABLE CHEST 1 VIEW COMPARISON:  02/04/2017 FINDINGS: Tracheostomy tube tip is above the carina. The heart size is mildly enlarged. There is mild interstitial edema. No airspace opacities. IMPRESSION: 1. Cardiac enlargement and mild edema. Electronically Signed   By: Kerby Moors M.D.   On: 02/17/2017 10:53   Dg Chest Port 1 View  Result Date: 02/04/2017 CLINICAL DATA:  Sepsis, shortness of breath. History of colon cancer, recent for percutaneous gastrostomy tube placement. EXAM: PORTABLE CHEST - 1 VIEW; PORTABLE ABDOMEN - 1 VIEW COMPARISON:  CT abdomen and pelvis January 25, 2017 inch chest radiograph January 25, 2017 FINDINGS: Cardiac silhouette is upper limits of normal, mediastinal silhouette is nonsuspicious for this low inspiratory examination with crowded vasculature markings. Patient rotated to the RIGHT, unfurling the aorta. RIGHT mid lung zone bandlike density. Pleural effusions or focal consolidations. Trachea projects midline and there is no pneumothorax. Tracheostomy tube tip projects 3 cm above the carina. Included soft tissue planes and osseous structures are non-suspicious. Paucity of bowel gas on this single upright view. Single gas-filled nondistended loops below bowel in the LEFT lower quadrant. No intra-abdominal mass effect or pathologic calcifications. No intraperitoneal free air. Soft tissue planes and included osseous structures are nonsuspicious. IMPRESSION: RIGHT midlung zone  atelectasis/scarring. Borderline cardiomegaly. Nonspecific bowel gas pattern. Electronically Signed   By: Elon Alas M.D.   On: 02/04/2017 06:18   Dg Abd Portable 1v  Result Date: 02/24/2017 CLINICAL DATA:  Abdominal pain EXAM: PORTABLE ABDOMEN - 1 VIEW COMPARISON:  February 23, 2017 FINDINGS: There remains a loop of borderline dilated bowel in the mid abdomen. Elsewhere, there is somewhat of a paucity of gas. There is no air-fluid level. No free air. There are phleboliths in the pelvis. IMPRESSION: Essentially stable appearing loop of mildly dilated bowel in the mid abdomen. Generalized paucity of gas, stable. Suspect ileus with possible superimposed enteritis. Bowel obstruction felt to be less likely. No free air. Electronically Signed   By: Lowella Grip III M.D.   On: 02/24/2017 07:29   Dg Abd Portable 1v  Result Date: 02/23/2017 CLINICAL DATA:  Followup ileus. EXAM: PORTABLE ABDOMEN - 1 VIEW COMPARISON:  Yesterday. FINDINGS: The previously demonstrated dilated small bowel loop in the lower abdomen continues to have a maximum diameter of 4.3 cm. A paucity of intestinal gas is again demonstrated. Lumbar spine degenerative changes and bilateral pelvic phleboliths. IMPRESSION: Stable mild small bowel ileus. Electronically Signed   By: Claudie Revering M.D.   On: 02/23/2017 07:40   Dg Abd Portable 1v  Result Date: 02/22/2017 CLINICAL DATA:  Followup ileus.  Previous near total colectomy. EXAM: PORTABLE ABDOMEN - 1 VIEW COMPARISON:  02/20/2017. FINDINGS: Mildly dilated small bowel in the lower abdomen currently has a maximum diameter of 4.3 cm, previously 5.2 cm. Paucity of gas in the mid and upper abdomen. No gross free peritoneal air. Lumbar and lower thoracic spine degenerative changes. IMPRESSION: Improving small bowel ileus. Electronically Signed  By: Claudie Revering M.D.   On: 02/22/2017 08:02   Dg Abd Portable 1v  Result Date: 02/20/2017 CLINICAL DATA:  Abdominal distention. Ileus. History of  near total colectomy. EXAM: PORTABLE ABDOMEN - 1 VIEW COMPARISON:  02/18/2017 abdominal radiograph. 12/15/2013 CT abdomen/ pelvis. 01/25/2017 unenhanced CT abdomen. FINDINGS: Percutaneous gastrostomy catheter overlies the body of the stomach. Persistent gaseous distension of small bowel loop in the lower abdomen up to 5.2 cm diameter, not appreciably changed. No evidence of pneumatosis or pneumoperitoneum. No radiopaque urolithiasis. IMPRESSION: Stable dilated small bowel loop in the lower abdomen compatible with the provided history of adynamic ileus. No evidence of free air. Electronically Signed   By: Ilona Sorrel M.D.   On: 02/20/2017 10:30   Dg Abd Portable 1v  Result Date: 02/18/2017 CLINICAL DATA:  Hypoactive EXAM: PORTABLE ABDOMEN - 1 VIEW COMPARISON:  None. FINDINGS: Gaseous distended small bowel loops are noted mid lower abdomen suspicious for ileus or early bowel obstruction. IMPRESSION: Gaseous distended small bowel loops are noted mid lower abdomen suspicious for ileus or early bowel obstruction. Electronically Signed   By: Lahoma Crocker M.D.   On: 02/18/2017 20:22   Dg Abd Portable 1 View  Result Date: 02/04/2017 CLINICAL DATA:  Sepsis, shortness of breath. History of colon cancer, recent for percutaneous gastrostomy tube placement. EXAM: PORTABLE CHEST - 1 VIEW; PORTABLE ABDOMEN - 1 VIEW COMPARISON:  CT abdomen and pelvis January 25, 2017 inch chest radiograph January 25, 2017 FINDINGS: Cardiac silhouette is upper limits of normal, mediastinal silhouette is nonsuspicious for this low inspiratory examination with crowded vasculature markings. Patient rotated to the RIGHT, unfurling the aorta. RIGHT mid lung zone bandlike density. Pleural effusions or focal consolidations. Trachea projects midline and there is no pneumothorax. Tracheostomy tube tip projects 3 cm above the carina. Included soft tissue planes and osseous structures are non-suspicious. Paucity of bowel gas on this single upright view.  Single gas-filled nondistended loops below bowel in the LEFT lower quadrant. No intra-abdominal mass effect or pathologic calcifications. No intraperitoneal free air. Soft tissue planes and included osseous structures are nonsuspicious. IMPRESSION: RIGHT midlung zone atelectasis/scarring. Borderline cardiomegaly. Nonspecific bowel gas pattern. Electronically Signed   By: Elon Alas M.D.   On: 02/04/2017 06:18   Dg Duanne Limerick W/water Sol Cm  Result Date: 01/28/2017 CLINICAL DATA:  Peg tube placed via endoscopy on 02/23, pulled out. Replaced yesterday with leakage. 37 French Foley placed today. EXAM: UPPER GI SERIES WITHOUT KUB TECHNIQUE: Routine upper GI series was performed with water-soluble barium. FLUOROSCOPY TIME:  Fluoroscopy Time:  42 seconds COMPARISON:  None. FINDINGS: 60 cc of Isovue-300 was administered via the indwelling PEG tube. Contrast filled the stomach. No extraluminal contrast leakage about the stomach or PEG tube. IMPRESSION: Contrast administration via the indwelling PEG tube demonstrating no extraluminal contrast leakage or other complicating feature. Electronically Signed   By: Franki Cabot M.D.   On: 01/28/2017 14:10   Dg Duanne Limerick W/water Sol Cm  Result Date: 01/27/2017 CLINICAL DATA:  Evaluate G-tube placement. EXAM: WATER SOLUBLE UPPER GI SERIES CONTRAST:  60 mL of Isovue-300 FLUOROSCOPY TIME:  Fluoroscopy Time:  36 seconds Number of Acquired Spot Images: 0 COMPARISON:  None. FINDINGS: The Foley catheter distal tip terminates in the stomach with an associated retention balloon. The stomach is normal in location and shape. There is normal emptying into the duodenum. IMPRESSION: The Foley catheter/G tube has been appropriately placed in the stomach. Electronically Signed   By: Dorise Bullion III M.D  On: 01/27/2017 14:34    Lab Data:  CBC:  Recent Labs Lab 02/22/17 0239 02/23/17 0309 02/24/17 0301 02/25/17 0252 02/26/17 0251  WBC 22.5* 25.1* 28.2* 28.3* 26.4*  HGB 7.5* 8.1*  8.2* 8.7* 8.1*  HCT 24.1* 24.7* 25.2* 26.8* 25.8*  MCV 88.6 83.7 83.2 83.5 84.3  PLT 255 189 273 304 202   Basic Metabolic Panel:  Recent Labs Lab 02/21/17 0306 02/22/17 0239 02/23/17 0309 02/24/17 0301 02/26/17 0251  NA 141 139 139 142 143  K 4.7 4.2 5.0 4.3 3.9  CL 109 107 111 111 113*  CO2 17* 14* 9* 12* 12*  GLUCOSE 160* 290* 207* 244* 207*  BUN 73* 79* 84* 90* 102*  CREATININE 2.22* 2.26* 2.56* 2.94* 3.69*  CALCIUM 7.8* 7.5* 7.6* 7.9* 8.2*   GFR: Estimated Creatinine Clearance: 13.2 mL/min (A) (by C-G formula based on SCr of 3.69 mg/dL (H)). Liver Function Tests: No results for input(s): AST, ALT, ALKPHOS, BILITOT, PROT, ALBUMIN in the last 168 hours. No results for input(s): LIPASE, AMYLASE in the last 168 hours. No results for input(s): AMMONIA in the last 168 hours. Coagulation Profile: No results for input(s): INR, PROTIME in the last 168 hours. Cardiac Enzymes: No results for input(s): CKTOTAL, CKMB, CKMBINDEX, TROPONINI in the last 168 hours. BNP (last 3 results) No results for input(s): PROBNP in the last 8760 hours. HbA1C: No results for input(s): HGBA1C in the last 72 hours. CBG:  Recent Labs Lab 02/25/17 2015 02/25/17 2357 02/26/17 0410 02/26/17 0741 02/26/17 1121  GLUCAP 257* 208* 207* 238* 224*   Lipid Profile: No results for input(s): CHOL, HDL, LDLCALC, TRIG, CHOLHDL, LDLDIRECT in the last 72 hours. Thyroid Function Tests: No results for input(s): TSH, T4TOTAL, FREET4, T3FREE, THYROIDAB in the last 72 hours. Anemia Panel: No results for input(s): VITAMINB12, FOLATE, FERRITIN, TIBC, IRON, RETICCTPCT in the last 72 hours. Urine analysis:    Component Value Date/Time   COLORURINE AMBER (A) 02/17/2017 1315   APPEARANCEUR HAZY (A) 02/17/2017 1315   LABSPEC 1.028 02/17/2017 1315   PHURINE 5.0 02/17/2017 1315   GLUCOSEU 50 (A) 02/17/2017 1315   HGBUR SMALL (A) 02/17/2017 1315   BILIRUBINUR SMALL (A) 02/17/2017 1315   KETONESUR NEGATIVE  02/17/2017 1315   PROTEINUR 30 (A) 02/17/2017 1315   NITRITE NEGATIVE 02/17/2017 1315   LEUKOCYTESUR NEGATIVE 02/17/2017 [place] M.D. [facility] 02/26/2017, 11:40 AM  Pager: [phone] Between 7am to 7pm - call Pager - [phone]  After 7pm go to [email or web address] - password [email or web address]  Call night coverage person covering after 7pm

## 2017-02-26 NOTE — Procedures (Signed)
Sepsis  s/p RT IJ DL POWER PICC  No comp Stable 9cm length Tip svcra Ready for use Full report in PACS

## 2017-02-26 NOTE — Progress Notes (Signed)
ANTICOAGULATION CONSULT NOTE - Follow-Up Pharmacy Consult for Heparin Indication: DVT  No Known Allergies  Patient Measurements: Height: 5\' 2"  (157.5 cm) Weight: 178 lb 5.6 oz (80.9 kg) IBW/kg (Calculated) : 50.1 Heparin Dosing Weight: 64 kg  Vital Signs: Temp: 98.2 F (36.8 C) (04/03 2014) Temp Source: Axillary (04/03 2014) BP: 105/47 (04/04 0330) Pulse Rate: 66 (04/04 0403)  Labs:  Recent Labs  02/24/17 0301 02/25/17 0252 02/26/17 0251  HGB 8.2* 8.7* 8.1*  HCT 25.2* 26.8* 25.8*  PLT 273 304 238  HEPARINUNFRC 0.55 0.55 <0.10*  CREATININE 2.94*  --  3.69*    Estimated Creatinine Clearance: 13.2 mL/min (A) (by C-G formula based on SCr of 3.69 mg/dL (H)).   Assessment: 27 YOF with metastatic colon CA who presented on 3/26 with fever and SIRS concerning for sepsis. The patient was also noted to have a new RLE DVT.   Heparin level undetectable this morning - heparin gtt off since 4/3 1100. She only has one IV and heparin/amiodarone are not compatible and cardiology felt amiodarone was more important. Getting PICC line today so will restart heparin once it is placed.   Goal of Therapy:  Heparin level 0.3-0.7 units/ml Monitor platelets by anticoagulation protocol: Yes    Plan:  F/u heparin restart  Sherlon Handing, PharmD, BCPS Clinical pharmacist, pager [phone] 02/26/2017, 5:53 AM

## 2017-02-26 NOTE — Progress Notes (Signed)
Inpatient Diabetes Program Recommendations  AACE/ADA: New Consensus Statement on Inpatient Glycemic Control (2015)  Target Ranges:  Prepandial:   less than 140 mg/dL      Peak postprandial:   less than 180 mg/dL (1-2 hours)      Critically ill patients:  140 - 180 mg/dL   Lab Results  Component Value Date   GLUCAP 224 (H) 02/26/2017   HGBA1C 7.2 (H) 01/10/2017   Results for [name], [name] (MRN [id number]) as of 02/26/2017 14:05  Ref. Range 02/25/2017 07:56 02/25/2017 12:27 02/25/2017 16:34 02/25/2017 20:15 02/25/2017 23:57 02/26/2017 04:10 02/26/2017 07:41 02/26/2017 11:21  Glucose-Capillary Latest Ref Range: 65 - 99 mg/dL 223 (H) 288 (H) 270 (H) 257 (H) 208 (H) 207 (H) 238 (H) 224 (H)    Review of Glycemic Control  Diabetes history:DM2  Outpatient Diabetes medications: Tresiba 20 units daily @ HS, Novolog 8 units Q4H with tube feedings  Current orders for Inpatient glycemic control: Started tube feedings 4/1 @1700 , sensitive correction scale Novolog 0-9 units Q4H   Inpatient Diabetes Program Recommendations:   Insulin - Basal: Please consider half of home basal dose of Lantus 10 units daily QHS.  Insulin - Meal Coverage: Please consider tube feeding coverage of Novolog 2-3units Q4H (hold if tube feeding held).  Thank you,  Windy Carina, RN, MSN Diabetes Coordinator Inpatient Diabetes Program [phone] (Team Pager)

## 2017-02-26 NOTE — Plan of Care (Signed)
Problem: Nutrition: Goal: Adequate nutrition will be maintained Outcome: Progressing Tube feeding increased to goal rate of 55ml/hour. Will continue to monitor patient for signs of intolerance and aspiration.

## 2017-02-27 DIAGNOSIS — C799 Secondary malignant neoplasm of unspecified site: Secondary | ICD-10-CM

## 2017-02-27 LAB — GLUCOSE, CAPILLARY
GLUCOSE-CAPILLARY: 305 mg/dL — AB (ref 65–99)
GLUCOSE-CAPILLARY: 266 mg/dL — AB (ref 65–99)
GLUCOSE-CAPILLARY: 209 mg/dL — AB (ref 65–99)
Glucose-Capillary: 246 mg/dL — ABNORMAL HIGH (ref 65–99)
Glucose-Capillary: 249 mg/dL — ABNORMAL HIGH (ref 65–99)
Glucose-Capillary: 232 mg/dL — ABNORMAL HIGH (ref 65–99)
Glucose-Capillary: 255 mg/dL — ABNORMAL HIGH (ref 65–99)
Glucose-Capillary: 226 mg/dL — ABNORMAL HIGH (ref 65–99)

## 2017-02-27 LAB — CBC
HCT: 25.8 % — ABNORMAL LOW (ref 36.0–46.0)
HEMOGLOBIN: 8.1 g/dL — AB (ref 12.0–15.0)
MCH: 26.3 pg (ref 26.0–34.0)
MCHC: 31.4 g/dL (ref 30.0–36.0)
MCV: 83.8 fL (ref 78.0–100.0)
PLATELETS: 195 10*3/uL (ref 150–400)
RBC: 3.08 MIL/uL — AB (ref 3.87–5.11)
RDW: 20.5 % — ABNORMAL HIGH (ref 11.5–15.5)
WBC: 29.8 10*3/uL — AB (ref 4.0–10.5)

## 2017-02-27 LAB — BASIC METABOLIC PANEL
ANION GAP: 18 — AB (ref 5–15)
BUN: 105 mg/dL — ABNORMAL HIGH (ref 6–20)
CHLORIDE: 110 mmol/L (ref 101–111)
CO2: 13 mmol/L — ABNORMAL LOW (ref 22–32)
Calcium: 8.1 mg/dL — ABNORMAL LOW (ref 8.9–10.3)
Creatinine, Ser: 4.12 mg/dL — ABNORMAL HIGH (ref 0.44–1.00)
GFR, EST AFRICAN AMERICAN: 11 mL/min — AB (ref >60)
GFR, EST NON AFRICAN AMERICAN: 10 mL/min — AB (ref >60)
Glucose, Bld: 265 mg/dL — ABNORMAL HIGH (ref 65–99)
POTASSIUM: 4.2 mmol/L (ref 3.5–5.1)
SODIUM: 141 mmol/L (ref 135–145)

## 2017-02-27 LAB — HEPARIN LEVEL (UNFRACTIONATED): Heparin Unfractionated: 0.43 IU/mL (ref 0.30–0.70)

## 2017-02-27 MED ORDER — INSULIN GLARGINE 100 UNIT/ML ~~LOC~~ SOLN
10.0000 [IU] | Freq: Every day | SUBCUTANEOUS | Status: DC
  Administered 2017-02-27 – 2017-03-04 (×6): 10 [IU] via SUBCUTANEOUS
  Filled 2017-02-27 (×7): qty 0.1

## 2017-02-27 MED ORDER — GLYCOPYRROLATE 0.2 MG/ML IJ SOLN
0.4000 mg | Freq: Four times a day (QID) | INTRAMUSCULAR | Status: DC | PRN
  Administered 2017-02-27 – 2017-03-04 (×3): 0.4 mg via INTRAVENOUS
  Filled 2017-02-27 (×3): qty 2

## 2017-02-27 NOTE — Plan of Care (Signed)
Problem: Nutrition: Goal: Adequate nutrition will be maintained Outcome: Progressing Patient is tolerating tube feeds at current goal rate.  Daughter concerned of increased blood sugars (300s) and educated that this is common and that the tube feeding is why we are checking it every 4 hours.

## 2017-02-27 NOTE — Progress Notes (Addendum)
[facility]                                                                              Patient Demographics  April Franklin, is a 76 y.o. female, DOB - March 12, 1941, QIH:[id number]  Admit date - 02/17/2017   Admitting Physician Waldemar Dickens, MD  Outpatient Primary MD for the patient is [name], MD  Outpatient specialists:   LOS - 10  days    Chief Complaint  Patient presents with  . 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 Chief complaint was leaking G-tube with intermittent fevers.  X ray shows ileus vs early obstruction.  Patient also had propagation of lower extremity DVT.   Overall prognosis very poor and family has vascillated with regards to her overall GOC. Currently pt is DNR.  Palliative care has met with family again and family wishes to pursue treatment of treatable conditions.  There are multiple family members involved in patient's care.     Assessment & Plan    ETHICS: -this is a 75/F with adenocA colon with liver mets, recent multiple hospitalization with strokes with PEG and Trach, bed bound, non verbal and poorly responsive at baseline admitted with N/vomiting, seen by Palliative care last few admissions, family resistant to Full Comfort care, they are still hopeful for recovery and a miracle despite being told regularly by medical professionals that she is dying. -discussed this yet again with daughter and [name], NP-Palliative medicine, plan to d/w family again this pm to progress with comfort measures -addendum: another long discussion with family with Stanton Kidney from Palliative on prognosis, comfort,  death etc-they agree with stopping amio/heparin, want to keep tube feeds/Abx on today, they agree with stopping blood draws  SIRS/Sepsis likely due to aspiration/sacral decub wounds - Low-grade fevers with tachypnea, tachycardia, leukocytosis, elevated lactic acid, acute kidney injury, encephalopathy.  - Patient was placed on IV fluid resuscitation and broad-spectrum antibiotics, currently on IV Zosyn.  - C. difficile negative, MRSA PCR screening negative, no UTI.   - Chest x-ray 4/2 showed bilateral hypo-inflation, atelectasis or early pneumonia in the right upper lobe new since the previous study. Still has profound leukocytosis.  - Continue IV Zosyn Day 10 now-no significant improvement despite this  Acute kidney injury.  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 -s/p PEG tube placement per Dr.Rai   Acute on  chronic respiratory failure. Status post tracheostomy 2 hospitalizations ago. At that time, was also treated for aspiration pneumonia.  -Chest x-ray as noted above. ABG with pH of 7.51, PCO2 26.7, PO2 87 bicarb 21.7. -See #1, continue antitussives, scopolamine, O2 supplementation as indicated, on IV Zosyn  Likely aspirating, continue IV Zosyn -also was on IV lasix-now held   Acute on chronic encephalopathy. Likely related to above in setting of recent stroke. Recent CT showed progressing of acute infarct on the left.  - Previously followed by neurology and discharged on  3/9 by Dr. 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 Home meds include losartan and beta blocker. -now held   Diabetes. Serum glucose 155 on admission. Recent hemoglobin A1c is 7.2. Home medications include Lantus -Hold Lantus for now -Sliding scale insulin for optimal control  Lower extremity edema. right worse than left  - DVT 2 hospitalizations ago.  Duplex had shown worsening DVT, patient is not ambulatory and hypercoagulable from adenocarcinoma of colon with metastasis - Anticoagulation stopped last hospitalization due to anemia. Synovial fluid aspirated in ED, cultures negative   Anemia- - suspect decrease in Hgb from volume dilution. Unclear source. During previous hospitalization, Dr [name] had discussed case with GI who did not recommend endoscopic evaluation given significant cardiovascular risk involved - follow closely, will transfuse 1 unit pRBC  Recent NSTEMI - no echo done since increased troponin - no intervention possible due to patient's poor prognosis/current state - Cardiology following  Sacral Pressure Injury 02/17/17 appears unstageable per nusing -WOC consult  Paroxysmal Atrial Fib with RVR  - on 3/30, patient went into rapid atrial fibrillation with RVR, cardiology was consulted and recommended starting IV amiodarone. Patient subsequently converted to normal sinus rhythm and heart rate remained controlled. Amiodarone was discontinued on 02/24/17 due to liver metastases, and elevated liver enzymes it was a temporary measure. -  However, then back again patient back in rapid atrial fib with RVR, heart rate was in 140s, BP in 80s.  -Amio restarted, Cards following, also on IV heparin  Code Status: dnr  DVT Prophylaxis:   Heparin drip Family Communication: Discussed in detail with the patient, all imaging results, lab results explained to the patient's daughter at the bedside   Disposition Plan:  Unclear, Comfort Care recommended to family everyday  Time Spent in minutes  45 minutes  Procedures:  Abdominal x-ray cxr   Consultants:   Palliative care Cardiology   Antimicrobials:   Zosyn 3/27 >>   Medications  Scheduled Meds: . chlorhexidine  15 mL Mouth Rinse BID  . insulin aspart  0-9 Units Subcutaneous Q4H  . insulin glargine  10 Units Subcutaneous QHS  . mouth rinse  15 mL Mouth Rinse q12n4p  . pantoprazole (PROTONIX) IV  40 mg Intravenous Q24H  . piperacillin-tazobactam (ZOSYN)  IV  2.25 g Intravenous Q6H  . sodium chloride flush  3 mL Intravenous Q12H   Continuous Infusions: . amiodarone 30 mg/hr (02/27/17 0500)  . feeding supplement (GLUCERNA 1.2 CAL) 1,500 mL (02/27/17 0500)  . heparin 1,750 Units/hr (02/27/17 0325)   PRN Meds:.acetaminophen **OR** acetaminophen, albuterol, morphine injection, ondansetron **OR** ondansetron (ZOFRAN) IV   Antibiotics   Anti-infectives    Start     Dose/Rate Route Frequency Ordered Stop   02/21/17 0830  piperacillin-tazobactam (ZOSYN)  IVPB 2.25 g     2.25 g 100 mL/hr over 30 Minutes Intravenous Every 6 hours 02/21/17 0805     02/21/17 0815  piperacillin-tazobactam (ZOSYN) IVPB 2.25 g  Status:  Discontinued     2.25 g 100 mL/hr over 30 Minutes Intravenous Every 6 hours 02/21/17 0802 02/21/17 0805   02/18/17 1200  vancomycin (VANCOCIN) IVPB 1000 mg/200 mL premix     1,000 mg 200 mL/hr over 60 Minutes Intravenous Every 24 hours 02/17/17 1108 02/19/17 1210   02/17/17 1600  piperacillin-tazobactam (ZOSYN) IVPB 3.375 g  Status:  Discontinued     3.375 g 12.5 mL/hr over 240 Minutes Intravenous Every 8 hours 02/17/17 1108 02/21/17 0801   02/17/17 1330  piperacillin-tazobactam (ZOSYN) IVPB 3.375 g  Status:  Discontinued     3.375 g 100 mL/hr over 30  Minutes Intravenous  Once 02/17/17 1317 02/17/17 1322   02/17/17 1330  vancomycin (VANCOCIN) IVPB 1000 mg/200 mL premix  Status:  Discontinued     1,000 mg 200 mL/hr over 60 Minutes Intravenous  Once 02/17/17 1317 02/17/17 1322   02/17/17 1000  piperacillin-tazobactam (ZOSYN) IVPB 3.375 g     3.375 g 100 mL/hr over 30 Minutes Intravenous  Once 02/17/17 0945 02/17/17 1038   02/17/17 1000  vancomycin (VANCOCIN) IVPB 1000 mg/200 mL premix  Status:  Discontinued     1,000 mg 200 mL/hr over 60 Minutes Intravenous  Once 02/17/17 0945 02/17/17 0953   02/17/17 1000  vancomycin (VANCOCIN) 1,250 mg in sodium chloride 0.9 % 250 mL IVPB     1,250 mg 166.7 mL/hr over 90 Minutes Intravenous  Once 02/17/17 0953 02/17/17 1306        Subjective:   Keyairra Kolinski was seen and examined today.  Unresponsive to any verbal commands.    Objective:   Vitals:   02/27/17 0338 02/27/17 0721 02/27/17 0753 02/27/17 1102  BP: (!) 109/44 (!) 106/42 (!) 106/42   Pulse: 68 69 68   Resp: (!) 27 (!) 39 (!) 36 (!) 38  Temp: 97.6 F (36.4 C) 97.9 F (36.6 C)    TempSrc: Axillary Axillary    SpO2: 91% 92% 94% 94%  Weight:      Height:        Intake/Output Summary (Last 24 hours) at 02/27/17 1145 Last data filed at 02/27/17 0700  Gross per 24 hour  Intake           911.59 ml  Output               25 ml  Net           886.59 ml     Wt Readings from Last 3 Encounters:  02/27/17 85.3 kg (188 lb)  02/04/17 65.2 kg (143 lb 11.2 oz)  01/31/17 63.2 kg (139 lb 5.3 oz)     Exam  General:Unresponsive, not responding to any verbal commands  Neck: Supple, trach +  Cardiovascular: S1 S2 clear, RRR  Respiratory: coarse breath sounds throughout, worse in RUL  Gastrointestinal : Soft, nontender  Ext: cyanotic changes noted in both feet  Neuro: obtunded  Skin: No rashes  Psych:  Unable to assess  Data Reviewed:  I have personally reviewed following labs and imaging studies  Micro Results Recent  Results (from the past 240 hour(s))  Body fluid culture     Status: None   Collection Time: 02/17/17 12:48 PM  Result Value Ref Range Status   Specimen Description SYNOVIAL RIGHT KNEE  Final  Special Requests NONE  Final   Gram Stain   Final    MODERATE WBC PRESENT, PREDOMINANTLY MONONUCLEAR RARE WBC PRESENT, PREDOMINANTLY PMN NO ORGANISMS SEEN    Culture NO GROWTH 3 DAYS  Final   Report Status 02/21/2017 FINAL  Final  MRSA PCR Screening     Status: None   Collection Time: 02/18/17  5:54 PM  Result Value Ref Range Status   MRSA by PCR NEGATIVE NEGATIVE Final    Comment:        The GeneXpert MRSA Assay (FDA approved for NASAL specimens only), is one component of a comprehensive MRSA colonization surveillance program. It is not intended to diagnose MRSA infection nor to guide or monitor treatment for MRSA infections.   C difficile quick scan w PCR reflex     Status: None   Collection Time: 02/23/17  1:47 PM  Result Value Ref Range Status   C Diff antigen NEGATIVE NEGATIVE Final   C Diff toxin NEGATIVE NEGATIVE Final   C Diff interpretation No C. difficile detected.  Final    Radiology Reports Ct Head Wo Contrast  Result Date: 02/04/2017 CLINICAL DATA:  Sepsis.  Unresponsive for several days. EXAM: CT HEAD WITHOUT CONTRAST TECHNIQUE: Contiguous axial images were obtained from the base of the skull through the vertex without intravenous contrast. COMPARISON:  01/31/2017 FINDINGS: Brain: Diffuse cerebral atrophy. Mild ventricular dilatation consistent with central atrophy. Low-attenuation changes in the deep white matter with old lacunar infarcts consistent with small vessel ischemic change. Area of developing encephalomalacia in the right occipital lobe and right greater than left cerebellar hemispheres consistent with evolving infarcts. There is increased low-attenuation change in sulcal effacement in the left occipital lobe since previous study suggesting developing subacute  infarct. The area of involvement appears more prominent than on the recent MRI. No midline shift. No abnormal extra-axial fluid collections. Gray-white matter junctions are distinct. Basal cisterns are not effaced. No acute intracranial hemorrhage. Vascular: Vascular calcifications are present. Skull: Calvarium appears intact. Sinuses/Orbits: Visualized paranasal sinuses and mastoid air cells are not opacified. Other: None. IMPRESSION: Developing low-attenuation and sulcal effacement in the left occipital lobe suggesting progressing acute infarct. Expected evolutionary changes of previous infarct in the right occipital region and bilateral cerebellum. No acute intracranial hemorrhage or significant mass effect. Electronically Signed   By: Lucienne Capers M.D.   On: 02/04/2017 06:42   Ct Head Wo Contrast  Result Date: 01/31/2017 CLINICAL DATA:  Change in mental status. EXAM: CT HEAD WITHOUT CONTRAST TECHNIQUE: Contiguous axial images were obtained from the base of the skull through the vertex without intravenous contrast. COMPARISON:  01/10/2017 FINDINGS: Brain: Expected progressive low-density and loss of mass-effect in the areas of late subacute infarct seen previously. The most confluent infarcts are in the right cerebellum and right occipital lobe, where there is also fading petechial hemorrhage. Small infarcts along the cerebral watershed at the vertex are largely underestimated relative to prior MRI. Small patchy subacute infarcts in the left cerebellum. Improved patency of the fourth ventricle. No hydrocephalus. No detected new infarct. Vascular: No asymmetric hyperdense vessel. Atherosclerotic calcification. Skull: No acute or aggressive finding Sinuses/Orbits: Gaze to the left. IMPRESSION: 1. No acute finding. 2. Expected evolution of recent extensive cerebral and cerebellar infarction. No evidence of infarct progression. Known petechial hemorrhage without progression. 3. Decrease cytotoxic edema in the  posterior fossa with normalized fourth ventricle. Electronically Signed   By: Monte Fantasia M.D.   On: 01/31/2017 13:16   Ir Fluoro Guide Cv  Line Right  Result Date: 02/26/2017 INDICATION: SEPSIS, ATRIAL FIBRILLATION EXAM: ULTRASOUND AND FLUOROSCOPIC GUIDED RIGHT IJ NON TUNNELED PICC LINE INSERTION MEDICATIONS: 1% LIDOCAINE LOCALLY CONTRAST:  None FLUOROSCOPY TIME:  Thirty seconds (1 mGy) COMPLICATIONS: None immediate. TECHNIQUE: The procedure, risks, benefits, and alternatives were explained to the patient's family and informed written consent was obtained. A timeout was performed prior to the initiation of the procedure. The right neck was prepped with chlorhexidine in a sterile fashion, and a sterile drape was applied covering the operative field. Maximum barrier sterile technique with sterile gowns and gloves were used for the procedure. A timeout was performed prior to the initiation of the procedure. Local anesthesia was provided with 1% lidocaine. Under direct ultrasound guidance, the right IJ vein was accessed with a micropuncture kit after the overlying soft tissues were anesthetized with 1% lidocaine. An ultrasound image was saved for documentation purposes. A guidewire was advanced to the level of the superior caval-atrial junction for measurement purposes and the PICC line was cut to length. A peel-away sheath was placed and a 9 cm, 5 [place], dual lumen was inserted to level of the superior caval-atrial junction. A post procedure spot fluoroscopic was obtained. The catheter easily aspirated and flushed and was sutured in place. A dressing was placed. The patient tolerated the procedure well without immediate post procedural complication. FINDINGS: After catheter placement, the tip lies within the superior cavoatrial junction. The catheter aspirates and flushes normally and is ready for immediate use. IMPRESSION: Successful ultrasound and fluoroscopic guided placement of a right IJ vein approach, 9  cm, 5 [place], dual lumen non tunneled PICC with tip at the superior caval-atrial junction. The PICC line is ready for immediate use. Electronically Signed   By: Jerilynn Mages.  Shick M.D.   On: 02/26/2017 13:08   Ir Replc Gastro/colonic Tube Percut W/fluoro  Result Date: 02/25/2017 INDICATION: Gastrostomy tube was dislodged and patient has a Foley catheter in place. EXAM: REPLACEMENT OF GASTROSTOMY TUBE WITH FLUOROSCOPY FLUOROSCOPY TIME:  12 seconds, 1 mGy COMPLICATIONS: None immediate. MEDICATIONS: None ANESTHESIA/SEDATION: None CONTRAST:  10 mL Isovue-300 - administered into the gastric lumen. PROCEDURE: Procedure was performed by the IR technologist. Foley catheter was removed and a new 80 French balloon retention catheter was inserted. Balloon was inflated with 9 ml of saline. Contrast injection was performed. Catheter was flushed. Fluoroscopic images were taken and saved for this procedure. FINDINGS: New 72 French gastrostomy tube is positioned in the stomach. IMPRESSION: Replacement of gastrostomy tube. Electronically Signed   By: Markus Daft M.D.   On: 02/25/2017 15:17   Ir US Guide Vasc Access Right  Result Date: 02/26/2017 INDICATION: SEPSIS, ATRIAL FIBRILLATION EXAM: ULTRASOUND AND FLUOROSCOPIC GUIDED RIGHT IJ NON TUNNELED PICC LINE INSERTION MEDICATIONS: 1% LIDOCAINE LOCALLY CONTRAST:  None FLUOROSCOPY TIME:  Thirty seconds (1 mGy) COMPLICATIONS: None immediate. TECHNIQUE: The procedure, risks, benefits, and alternatives were explained to the patient's family and informed written consent was obtained. A timeout was performed prior to the initiation of the procedure. The right neck was prepped with chlorhexidine in a sterile fashion, and a sterile drape was applied covering the operative field. Maximum barrier sterile technique with sterile gowns and gloves were used for the procedure. A timeout was performed prior to the initiation of the procedure. Local anesthesia was provided with 1% lidocaine. Under direct  ultrasound guidance, the right IJ vein was accessed with a micropuncture kit after the overlying soft tissues were anesthetized with 1% lidocaine. An ultrasound image was saved for  documentation purposes. A guidewire was advanced to the level of the superior caval-atrial junction for measurement purposes and the PICC line was cut to length. A peel-away sheath was placed and a 9 cm, 5 [place], dual lumen was inserted to level of the superior caval-atrial junction. A post procedure spot fluoroscopic was obtained. The catheter easily aspirated and flushed and was sutured in place. A dressing was placed. The patient tolerated the procedure well without immediate post procedural complication. FINDINGS: After catheter placement, the tip lies within the superior cavoatrial junction. The catheter aspirates and flushes normally and is ready for immediate use. IMPRESSION: Successful ultrasound and fluoroscopic guided placement of a right IJ vein approach, 9 cm, 5 [place], dual lumen non tunneled PICC with tip at the superior caval-atrial junction. The PICC line is ready for immediate use. Electronically Signed   By: Jerilynn Mages.  Shick M.D.   On: 02/26/2017 13:08   Dg Abdomen Peg Tube Location  Result Date: 02/04/2017 CLINICAL DATA:  Gastrostomy catheter placement EXAM: ABDOMEN - 1 VIEW COMPARISON:  Study obtained earlier in the day. FINDINGS: Contrast, 50 mL Isovue-300, was injected into a gastrostomy catheter. The gastrostomy catheter is positioned in the gastric antrum. Contrast flows into the stomach without contrast extravasation. Bowel gas pattern is normal. No free air. IMPRESSION: Gastrostomy catheter positioning gastric antrum. No contrast extravasation evident. Electronically Signed   By: Lowella Grip III M.D.   On: 02/04/2017 17:09   Dg Chest Port 1 View  Result Date: 02/24/2017 CLINICAL DATA:  Dyspnea, tracheostomy patient, recent CVA. History of colonic malignancy EXAM: PORTABLE CHEST 1 VIEW COMPARISON:  Portable  chest x-ray dated February 19, 2017 FINDINGS: The patient is rotated on today's study. The lungs are hypoinflated. There is mildly increased density in the right paratracheal region as compared to the previous study. Elsewhere there is subtle patchy density at the left lung base which is stable. The heart is top-normal in size. The pulmonary vascularity is normal. The tracheostomy appliance tip projects just inferior to the inferior margin of the clavicular heads. The bony structures exhibit no acute abnormalities. IMPRESSION: Bilateral hypoinflation. There may be atelectasis or early pneumonia in the right upper lobe medially new since the previous study. Stable coarse lung markings at the left lung base. Follow-up radiographs are recommended. Electronically Signed   By: David  Martinique M.D.   On: 02/24/2017 14:41   Dg Chest Port 1 View  Result Date: 02/19/2017 CLINICAL DATA:  Sepsis, possible aspiration pneumonia. Patient admitted 02/17/2017. EXAM: PORTABLE CHEST 1 VIEW COMPARISON:  Single-view of the chest 02/18/2017 and 02/17/2017. FINDINGS: Right basilar atelectasis seen on yesterday's examination has resolved. Small focus of left basilar airspace disease is unchanged. There is no pneumothorax. Marked cardiomegaly without edema is seen. Tracheostomy tube is in place. IMPRESSION: No change in small focus of left basilar airspace disease. Resolved right basilar atelectasis. Cardiomegaly without edema. Electronically Signed   By: Inge Rise M.D.   On: 02/19/2017 07:56   Dg Chest Port 1 View  Result Date: 02/18/2017 CLINICAL DATA:  Ventilator dependent EXAM: PORTABLE CHEST 1 VIEW COMPARISON:  01/10/2017, 02/17/2017 FINDINGS: Tracheostomy tube in satisfactory position. Mild bilateral interstitial prominence likely secondary to low lung volumes. Mild right basilar atelectasis. No significant pleural effusion or pneumothorax. Stable cardiomegaly. No acute osseous abnormality. IMPRESSION: Cardiomegaly. No  active cardiopulmonary disease. Electronically Signed   By: Kathreen Devoid   On: 02/18/2017 16:21   Dg Chest Portable 1 View  Result Date: 02/17/2017 CLINICAL DATA:  Fever  and tachypnea. EXAM: PORTABLE CHEST 1 VIEW COMPARISON:  02/04/2017 FINDINGS: Tracheostomy tube tip is above the carina. The heart size is mildly enlarged. There is mild interstitial edema. No airspace opacities. IMPRESSION: 1. Cardiac enlargement and mild edema. Electronically Signed   By: Kerby Moors M.D.   On: 02/17/2017 10:53   Dg Chest Port 1 View  Result Date: 02/04/2017 CLINICAL DATA:  Sepsis, shortness of breath. History of colon cancer, recent for percutaneous gastrostomy tube placement. EXAM: PORTABLE CHEST - 1 VIEW; PORTABLE ABDOMEN - 1 VIEW COMPARISON:  CT abdomen and pelvis January 25, 2017 inch chest radiograph January 25, 2017 FINDINGS: Cardiac silhouette is upper limits of normal, mediastinal silhouette is nonsuspicious for this low inspiratory examination with crowded vasculature markings. Patient rotated to the RIGHT, unfurling the aorta. RIGHT mid lung zone bandlike density. Pleural effusions or focal consolidations. Trachea projects midline and there is no pneumothorax. Tracheostomy tube tip projects 3 cm above the carina. Included soft tissue planes and osseous structures are non-suspicious. Paucity of bowel gas on this single upright view. Single gas-filled nondistended loops below bowel in the LEFT lower quadrant. No intra-abdominal mass effect or pathologic calcifications. No intraperitoneal free air. Soft tissue planes and included osseous structures are nonsuspicious. IMPRESSION: RIGHT midlung zone atelectasis/scarring. Borderline cardiomegaly. Nonspecific bowel gas pattern. Electronically Signed   By: Elon Alas M.D.   On: 02/04/2017 06:18   Dg Abd Portable 1v  Result Date: 02/24/2017 CLINICAL DATA:  Abdominal pain EXAM: PORTABLE ABDOMEN - 1 VIEW COMPARISON:  February 23, 2017 FINDINGS: There remains a loop of  borderline dilated bowel in the mid abdomen. Elsewhere, there is somewhat of a paucity of gas. There is no air-fluid level. No free air. There are phleboliths in the pelvis. IMPRESSION: Essentially stable appearing loop of mildly dilated bowel in the mid abdomen. Generalized paucity of gas, stable. Suspect ileus with possible superimposed enteritis. Bowel obstruction felt to be less likely. No free air. Electronically Signed   By: Lowella Grip III M.D.   On: 02/24/2017 07:29   Dg Abd Portable 1v  Result Date: 02/23/2017 CLINICAL DATA:  Followup ileus. EXAM: PORTABLE ABDOMEN - 1 VIEW COMPARISON:  Yesterday. FINDINGS: The previously demonstrated dilated small bowel loop in the lower abdomen continues to have a maximum diameter of 4.3 cm. A paucity of intestinal gas is again demonstrated. Lumbar spine degenerative changes and bilateral pelvic phleboliths. IMPRESSION: Stable mild small bowel ileus. Electronically Signed   By: Claudie Revering M.D.   On: 02/23/2017 07:40   Dg Abd Portable 1v  Result Date: 02/22/2017 CLINICAL DATA:  Followup ileus.  Previous near total colectomy. EXAM: PORTABLE ABDOMEN - 1 VIEW COMPARISON:  02/20/2017. FINDINGS: Mildly dilated small bowel in the lower abdomen currently has a maximum diameter of 4.3 cm, previously 5.2 cm. Paucity of gas in the mid and upper abdomen. No gross free peritoneal air. Lumbar and lower thoracic spine degenerative changes. IMPRESSION: Improving small bowel ileus. Electronically Signed   By: Claudie Revering M.D.   On: 02/22/2017 08:02   Dg Abd Portable 1v  Result Date: 02/20/2017 CLINICAL DATA:  Abdominal distention. Ileus. History of near total colectomy. EXAM: PORTABLE ABDOMEN - 1 VIEW COMPARISON:  02/18/2017 abdominal radiograph. 12/15/2013 CT abdomen/ pelvis. 01/25/2017 unenhanced CT abdomen. FINDINGS: Percutaneous gastrostomy catheter overlies the body of the stomach. Persistent gaseous distension of small bowel loop in the lower abdomen up to 5.2 cm  diameter, not appreciably changed. No evidence of pneumatosis or pneumoperitoneum. No radiopaque urolithiasis. IMPRESSION:  Stable dilated small bowel loop in the lower abdomen compatible with the provided history of adynamic ileus. No evidence of free air. Electronically Signed   By: Ilona Sorrel M.D.   On: 02/20/2017 10:30   Dg Abd Portable 1v  Result Date: 02/18/2017 CLINICAL DATA:  Hypoactive EXAM: PORTABLE ABDOMEN - 1 VIEW COMPARISON:  None. FINDINGS: Gaseous distended small bowel loops are noted mid lower abdomen suspicious for ileus or early bowel obstruction. IMPRESSION: Gaseous distended small bowel loops are noted mid lower abdomen suspicious for ileus or early bowel obstruction. Electronically Signed   By: Lahoma Crocker M.D.   On: 02/18/2017 20:22   Dg Abd Portable 1 View  Result Date: 02/04/2017 CLINICAL DATA:  Sepsis, shortness of breath. History of colon cancer, recent for percutaneous gastrostomy tube placement. EXAM: PORTABLE CHEST - 1 VIEW; PORTABLE ABDOMEN - 1 VIEW COMPARISON:  CT abdomen and pelvis January 25, 2017 inch chest radiograph January 25, 2017 FINDINGS: Cardiac silhouette is upper limits of normal, mediastinal silhouette is nonsuspicious for this low inspiratory examination with crowded vasculature markings. Patient rotated to the RIGHT, unfurling the aorta. RIGHT mid lung zone bandlike density. Pleural effusions or focal consolidations. Trachea projects midline and there is no pneumothorax. Tracheostomy tube tip projects 3 cm above the carina. Included soft tissue planes and osseous structures are non-suspicious. Paucity of bowel gas on this single upright view. Single gas-filled nondistended loops below bowel in the LEFT lower quadrant. No intra-abdominal mass effect or pathologic calcifications. No intraperitoneal free air. Soft tissue planes and included osseous structures are nonsuspicious. IMPRESSION: RIGHT midlung zone atelectasis/scarring. Borderline cardiomegaly. Nonspecific bowel  gas pattern. Electronically Signed   By: Elon Alas M.D.   On: 02/04/2017 06:18   Dg Duanne Limerick W/water Sol Cm  Result Date: 01/28/2017 CLINICAL DATA:  Peg tube placed via endoscopy on 02/23, pulled out. Replaced yesterday with leakage. 20 French Foley placed today. EXAM: UPPER GI SERIES WITHOUT KUB TECHNIQUE: Routine upper GI series was performed with water-soluble barium. FLUOROSCOPY TIME:  Fluoroscopy Time:  42 seconds COMPARISON:  None. FINDINGS: 60 cc of Isovue-300 was administered via the indwelling PEG tube. Contrast filled the stomach. No extraluminal contrast leakage about the stomach or PEG tube. IMPRESSION: Contrast administration via the indwelling PEG tube demonstrating no extraluminal contrast leakage or other complicating feature. Electronically Signed   By: Franki Cabot M.D.   On: 01/28/2017 14:10    Lab Data:  CBC:  Recent Labs Lab 02/23/17 0309 02/24/17 0301 02/25/17 0252 02/26/17 0251 02/27/17 0252  WBC 25.1* 28.2* 28.3* 26.4* 29.8*  HGB 8.1* 8.2* 8.7* 8.1* 8.1*  HCT 24.7* 25.2* 26.8* 25.8* 25.8*  MCV 83.7 83.2 83.5 84.3 83.8  PLT 189 273 304 238 169   Basic Metabolic Panel:  Recent Labs Lab 02/22/17 0239 02/23/17 0309 02/24/17 0301 02/26/17 0251 02/27/17 0252  NA 139 139 142 143 141  K 4.2 5.0 4.3 3.9 4.2  CL 107 111 111 113* 110  CO2 14* 9* 12* 12* 13*  GLUCOSE 290* 207* 244* 207* 265*  BUN 79* 84* 90* 102* 105*  CREATININE 2.26* 2.56* 2.94* 3.69* 4.12*  CALCIUM 7.5* 7.6* 7.9* 8.2* 8.1*   GFR: Estimated Creatinine Clearance: 12.1 mL/min (A) (by C-G formula based on SCr of 4.12 mg/dL (H)). Liver Function Tests: No results for input(s): AST, ALT, ALKPHOS, BILITOT, PROT, ALBUMIN in the last 168 hours. No results for input(s): LIPASE, AMYLASE in the last 168 hours. No results for input(s): AMMONIA in the last 168 hours.  Coagulation Profile: No results for input(s): INR, PROTIME in the last 168 hours. Cardiac Enzymes: No results for input(s):  CKTOTAL, CKMB, CKMBINDEX, TROPONINI in the last 168 hours. BNP (last 3 results) No results for input(s): PROBNP in the last 8760 hours. HbA1C: No results for input(s): HGBA1C in the last 72 hours. CBG:  Recent Labs Lab 02/26/17 1817 02/26/17 1958 02/26/17 2317 02/27/17 0328 02/27/17 0719  GLUCAP 302* 325* 305* 246* 266*   Lipid Profile: No results for input(s): CHOL, HDL, LDLCALC, TRIG, CHOLHDL, LDLDIRECT in the last 72 hours. Thyroid Function Tests: No results for input(s): TSH, T4TOTAL, FREET4, T3FREE, THYROIDAB in the last 72 hours. Anemia Panel: No results for input(s): VITAMINB12, FOLATE, FERRITIN, TIBC, IRON, RETICCTPCT in the last 72 hours. Urine analysis:    Component Value Date/Time   COLORURINE AMBER (A) 02/17/2017 1315   APPEARANCEUR HAZY (A) 02/17/2017 1315   LABSPEC 1.028 02/17/2017 1315   PHURINE 5.0 02/17/2017 1315   GLUCOSEU 50 (A) 02/17/2017 1315   HGBUR SMALL (A) 02/17/2017 1315   BILIRUBINUR SMALL (A) 02/17/2017 1315   KETONESUR NEGATIVE 02/17/2017 1315   PROTEINUR 30 (A) 02/17/2017 1315   NITRITE NEGATIVE 02/17/2017 1315   LEUKOCYTESUR NEGATIVE 02/17/2017 [place] M.D. [facility] 02/27/2017, 11:45 AM  Pager: [phone] Between 7am to 7pm - call Pager - [phone]  After 7pm go to [email or web address] - password [email or web address]  Call night coverage person covering after 7pm

## 2017-02-27 NOTE — Progress Notes (Signed)
Called to bedside by RN for patient desaturation. Patient suctioned x3 for thick yellow secretions. FIO2 increased to 40% until SpO2 is stabilized. Patient tachypneic with Coarse Rhonchi BS on R side.   Mali Terel Bann RRT

## 2017-02-27 NOTE — Progress Notes (Signed)
Reviewed hosp course of past 24 hours.  No new recommendations.   WIll be available as needed for questions  Please call.  Dorris Carnes

## 2017-02-27 NOTE — Progress Notes (Signed)
ANTICOAGULATION CONSULT NOTE - Follow-Up Pharmacy Consult for Heparin Indication: DVT  No Known Allergies  Patient Measurements: Height: 5\' 2"  (157.5 cm) Weight: 188 lb (85.3 kg) IBW/kg (Calculated) : 50.1 Heparin Dosing Weight: 64 kg  Vital Signs: Temp: 97.9 F (36.6 C) (04/05 0721) Temp Source: Axillary (04/05 0721) BP: 106/42 (04/05 0753) Pulse Rate: 68 (04/05 0753)  Labs:  Recent Labs  02/25/17 0252 02/26/17 0251 02/26/17 2150 02/27/17 0251 02/27/17 0252  HGB 8.7* 8.1*  --   --  8.1*  HCT 26.8* 25.8*  --   --  25.8*  PLT 304 238  --   --  195  HEPARINUNFRC 0.55 <0.10* 0.56 0.43  --   CREATININE  --  3.69*  --   --  4.12*    Estimated Creatinine Clearance: 12.1 mL/min (A) (by C-G formula based on SCr of 4.12 mg/dL (H)).   Assessment: 31 YOF with metastatic colon CA who presented on 3/26 with fever and SIRS concerning for sepsis. The patient was also noted to have a new RLE DVT.   Heparin level now therapeutic (0.43) on gtt at 1750 units/hr. No bleeding noted.  She is also on Day #10 of zosyn for sepsis. Note her SCr continues to rise. Her dose is adjusted appropriately for her renal function.  Discussed with Dr. Broadus John today - unclear duration of antibiotic therapy in this complex patient.  Goal of Therapy:  Heparin level 0.3-0.7 units/ml Monitor platelets by anticoagulation protocol: Yes    Plan:  Continue heparin infusion at 1750 units/hr Daily heparin level and CBC Continue zosyn 2.25g IV q6h Monitor renal function and micro data  Legrand Como, Pharm.D., BCPS, AAHIVP Clinical Pharmacist Phone: [phone] or [phone] 02/27/2017, 10:09 AM

## 2017-02-27 NOTE — Progress Notes (Signed)
Patient ID: April Franklin, female   DOB: 12/08/1940, 76 y.o.   MRN: [id number]  This NP visited patient at the bedside along with attending Dr Broadus John and the patient's family to include her three daughter and husband to again review current medical situation, diagnosis,  prognosis and GOCs and recommendation for a more comfort approach.  Reviewed labs and her underlying metastatic cancer diagnosis.  Family verbalized that "we never heard she had stage IV cancer".  Dr Broadus John explained, and addressed questions and concerns.  Family verbalized concerns related to her skin breakdown, we discussed concept of failure to thrive and how the skin is also affected in a terminal situation.     Discussed natural trajectory and expectations at EOL, detailed elements of a more comfort path.  Family agrees to limit lab draws,  IV medications and the utilization of prn opioids to enhance comfort.   Questions and concerns addressed.  I infomed family I will be out of the hospital until Monday and encouraged them to call team phone with questions or concerns.  PMT is available for holistic support.  Time in   1400        Time out    1500        Total time 60 minutes  Greater than 50% of the time was spent in counseling and coordination of care  [name] NP  Palliative Medicine Team Team Phone # [phone] Pager [phone]

## 2017-02-28 LAB — GLUCOSE, CAPILLARY
GLUCOSE-CAPILLARY: 124 mg/dL — AB (ref 65–99)
GLUCOSE-CAPILLARY: 162 mg/dL — AB (ref 65–99)
Glucose-Capillary: 227 mg/dL — ABNORMAL HIGH (ref 65–99)
Glucose-Capillary: 179 mg/dL — ABNORMAL HIGH (ref 65–99)
Glucose-Capillary: 122 mg/dL — ABNORMAL HIGH (ref 65–99)
Glucose-Capillary: 155 mg/dL — ABNORMAL HIGH (ref 65–99)

## 2017-02-28 MED ORDER — GLUCERNA 1.2 CAL PO LIQD
1000.0000 mL | ORAL | Status: DC
  Administered 2017-03-02 – 2017-03-04 (×4): 1000 mL
  Filled 2017-02-28 (×6): qty 1000
  Filled 2017-02-28: qty 1500
  Filled 2017-02-28: qty 1000

## 2017-02-28 NOTE — Progress Notes (Signed)
Pharmacy Antibiotic Note  April Franklin is a 76 y.o. female admitted on 02/17/2017 with sepsis.  Pharmacy has been consulted for Zosyn dosing.  Her zosyn regimen is adjusted for her renal dysfunction. Note plan for no additional lab draws.  Plan: Continue Zosyn As she is transitioning to palliative care and will not have additional lab draws pharmacy will sign off. Thank you for the consult.  Height: 5\' 2"  (157.5 cm) Weight: 190 lb (86.2 kg) IBW/kg (Calculated) : 50.1  Temp (24hrs), Avg:97.5 F (36.4 C), Min:96.9 F (36.1 C), Max:97.9 F (36.6 C)   Recent Labs Lab 02/22/17 0239 02/23/17 0309 02/24/17 0301 02/25/17 0252 02/26/17 0251 02/27/17 0252  WBC 22.5* 25.1* 28.2* 28.3* 26.4* 29.8*  CREATININE 2.26* 2.56* 2.94*  --  3.69* 4.12*    Estimated Creatinine Clearance: 12.2 mL/min (A) (by C-G formula based on SCr of 4.12 mg/dL (H)).    No Known Allergies    Thank you for allowing pharmacy to be a part of this patient's care.  Legrand Como, Pharm.D., BCPS, AAHIVP Clinical Pharmacist Phone: [phone] or [phone] 02/28/2017, 10:43 AM

## 2017-02-28 NOTE — Progress Notes (Signed)
[facility]                                                                              Patient Demographics  April Franklin, is a 76 y.o. female, DOB - 03/07/1941, QPY:[id number]  Admit date - 02/17/2017   Admitting Physician Waldemar Dickens, MD  Outpatient Primary MD for the patient is [name], MD  Outpatient specialists:   LOS - 11  days    Chief Complaint  Patient presents with  . 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 Chief complaint was leaking G-tube with intermittent fevers.  X ray shows ileus vs early obstruction.  Patient also had propagation of lower extremity DVT.   Overall prognosis very poor and family has vascillated with regards to her overall GOC. Currently pt is DNR.  Palliative care has met with family again and family wishes to pursue treatment of treatable conditions.  There are multiple family members involved in patient's care.     Assessment & Plan    ETHICS: -this is a 75/F with adenocA colon with liver mets, recent multiple hospitalization with strokes with PEG and Trach, bed bound, non verbal and poorly responsive at baseline admitted with N/Vomiting, seen by Palliative care last few admissions, family resistant to Full Comfort care, they are still hopeful for recovery and a miracle despite being told regularly by medical professionals that she is dying. -discussed this yet again with daughter and [name], NP-Palliative medicine, and again 4/5 pm with all 3 daughters and spouse to progress with comfort measures, after this meeting they agreed with stopping amio/heparin, stopping blood draws,  they want to keep tube feeds/Abx on for now  SIRS/Sepsis likely due to aspiration/sacral decub wounds - Low-grade fevers with tachypnea, tachycardia, leukocytosis, elevated lactic acid, acute kidney injury, encephalopathy.  - Patient was placed on IV fluid resuscitation and broad-spectrum antibiotics, currently on IV Zosyn.  - C. difficile negative, MRSA PCR screening negative, no UTI.   - Chest x-ray 4/2 showed bilateral hypo-inflation, atelectasis or early pneumonia in the right upper lobe new since the previous study. Still has profound leukocytosis.  - On IV Zosyn Day 11 now-no significant improvement despite this -will stop this in 1-2days, need extensive discussions with multiple family members before anything can be changed  Acute kidney injury.  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 -s/p PEG  tube placement per Dr.Rai   Acute on chronic respiratory failure. Status post tracheostomy 2 hospitalizations ago. At that time, was also treated for aspiration pneumonia.  -Chest x-ray as noted above. ABG with pH of 7.51, PCO2 26.7, PO2 87 bicarb 21.7. -See #1, continue antitussives, scopolamine, O2 supplementation as indicated, on IV Zosyn  Likely aspirating, continue IV Zosyn -also was on IV lasix-now held   Acute on chronic encephalopathy. Likely related to above in setting of recent stroke. Recent CT showed progressing of acute infarct on the left.  - Previously followed  by neurology and discharged on 3/9 by Dr. 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 Home meds include losartan and beta blocker. -now held   Diabetes. Serum glucose 155 on admission. Recent hemoglobin A1c is 7.2. Home medications include Lantus -Hold Lantus for now -Sliding scale insulin for optimal control  Lower extremity edema. right worse than left  - DVT 2 hospitalizations ago.  Duplex had shown worsening DVT, patient is not ambulatory and hypercoagulable from adenocarcinoma of colon with metastasis - Anticoagulation stopped last hospitalization due to anemia. Synovial fluid aspirated in ED, cultures negative   Anemia- - suspect decrease in Hgb from volume dilution. Unclear source. During previous hospitalization, Dr [name] had discussed case with GI who did not recommend endoscopic evaluation given significant cardiovascular risk involved - follow closely, will transfuse 1 unit pRBC  Recent NSTEMI - no echo done since increased troponin - no intervention possible due to patient's poor prognosis/current state - Cardiology following  Sacral Pressure Injury 02/17/17 appears unstageable per nusing -WOC consult  Paroxysmal Atrial Fib with RVR  - on 3/30, patient went into rapid atrial fibrillation with RVR, cardiology was consulted and recommended starting IV amiodarone and heparin -now both stopped  Code Status: dnr  DVT Prophylaxis:  Heparin drip Family Communication: Discussed in detail with the patient, all imaging results, lab results explained to the patient's daughter at the bedside  Disposition Plan:  Unclear, Hospital death vs Home with hospice suggested, [place] recommended to family everyday  Time Spent in minutes  45  minutes  Procedures:  Abdominal x-ray cxr   Consultants:   Palliative care Cardiology   Antimicrobials:   Zosyn 3/27 >>   Medications  Scheduled Meds: . chlorhexidine  15 mL Mouth Rinse BID  . feeding supplement (GLUCERNA 1.2 CAL)  1,000 mL Per Tube Q24H  . insulin aspart  0-9 Units Subcutaneous Q4H  . insulin glargine  10 Units Subcutaneous QHS  . mouth rinse  15 mL Mouth Rinse q12n4p  . pantoprazole (PROTONIX) IV  40 mg Intravenous Q24H  . piperacillin-tazobactam (ZOSYN)  IV  2.25 g Intravenous Q6H  . sodium chloride flush  3 mL Intravenous Q12H   Continuous Infusions:  PRN Meds:.acetaminophen **OR** acetaminophen, albuterol, glycopyrrolate, morphine injection, ondansetron **OR** ondansetron (ZOFRAN) IV   Antibiotics   Anti-infectives    Start     Dose/Rate Route Frequency Ordered Stop   02/21/17 0830  piperacillin-tazobactam (ZOSYN) IVPB 2.25 g     2.25 g 100 mL/hr over 30 Minutes Intravenous Every 6 hours 02/21/17 0805     02/21/17 0815  piperacillin-tazobactam (ZOSYN) IVPB 2.25 g  Status:  Discontinued     2.25 g 100 mL/hr over 30 Minutes Intravenous Every 6 hours 02/21/17 0802  02/21/17 0805   02/18/17 1200  vancomycin (VANCOCIN) IVPB 1000 mg/200 mL premix     1,000 mg 200 mL/hr over 60 Minutes Intravenous Every 24 hours 02/17/17 1108 02/19/17 1210   02/17/17 1600  piperacillin-tazobactam (ZOSYN) IVPB 3.375 g  Status:  Discontinued     3.375 g 12.5 mL/hr over 240 Minutes Intravenous Every 8 hours 02/17/17 1108 02/21/17 0801   02/17/17 1330  piperacillin-tazobactam (ZOSYN) IVPB 3.375 g  Status:  Discontinued     3.375 g 100 mL/hr over 30 Minutes Intravenous  Once 02/17/17 1317 02/17/17 1322   02/17/17 1330  vancomycin (VANCOCIN) IVPB 1000 mg/200 mL premix  Status:  Discontinued     1,000 mg 200 mL/hr over 60 Minutes Intravenous  Once 02/17/17 1317 02/17/17 1322   02/17/17 1000  piperacillin-tazobactam (ZOSYN) IVPB 3.375 g     3.375 g 100 mL/hr over 30  Minutes Intravenous  Once 02/17/17 0945 02/17/17 1038   02/17/17 1000  vancomycin (VANCOCIN) IVPB 1000 mg/200 mL premix  Status:  Discontinued     1,000 mg 200 mL/hr over 60 Minutes Intravenous  Once 02/17/17 0945 02/17/17 0953   02/17/17 1000  vancomycin (VANCOCIN) 1,250 mg in sodium chloride 0.9 % 250 mL IVPB     1,250 mg 166.7 mL/hr over 90 Minutes Intravenous  Once 02/17/17 0953 02/17/17 1306        Subjective:   Unresponsive to any verbal commands.    Objective:   Vitals:   02/28/17 0720 02/28/17 0900 02/28/17 1255 02/28/17 1257  BP: 123/61 (!) 117/53  (!) 137/115  Pulse: 69 67  (!) 59  Resp: (!) 23 (!) 26  11  Temp: (!) 96.9 F (36.1 C)   97.4 F (36.3 C)  TempSrc: Axillary  Oral Axillary  SpO2: 98% 98%  99%  Weight:      Height:       No intake or output data in the 24 hours ending 02/28/17 1415   Wt Readings from Last 3 Encounters:  02/28/17 86.2 kg (190 lb)  02/04/17 65.2 kg (143 lb 11.2 oz)  01/31/17 63.2 kg (139 lb 5.3 oz)     Exam  General:Unresponsive, not responding to any verbal commands  Neck: Supple, trach +  Cardiovascular: S1 S2 clear, RRR  Respiratory: coarse breath sounds throughout, worse in RUL  Gastrointestinal : Soft, nontender  Ext: cyanotic changes noted in both feet, 1-2plus edema diffusely  Neuro: obtunded  Skin: No rashes  Psych:  Unable to assess  Data Reviewed:  I have personally reviewed following labs and imaging studies  Micro Results Recent Results (from the past 240 hour(s))  MRSA PCR Screening     Status: None   Collection Time: 02/18/17  5:54 PM  Result Value Ref Range Status   MRSA by PCR NEGATIVE NEGATIVE Final    Comment:        The GeneXpert MRSA Assay (FDA approved for NASAL specimens only), is one component of a comprehensive MRSA colonization surveillance program. It is not intended to diagnose MRSA infection nor to guide or monitor treatment for MRSA infections.   C difficile quick scan w  PCR reflex     Status: None   Collection Time: 02/23/17  1:47 PM  Result Value Ref Range Status   C Diff antigen NEGATIVE NEGATIVE Final   C Diff toxin NEGATIVE NEGATIVE Final   C Diff interpretation No C. difficile detected.  Final    Radiology Reports Ct Head Wo Contrast  Result Date:  02/04/2017 CLINICAL DATA:  Sepsis.  Unresponsive for several days. EXAM: CT HEAD WITHOUT CONTRAST TECHNIQUE: Contiguous axial images were obtained from the base of the skull through the vertex without intravenous contrast. COMPARISON:  01/31/2017 FINDINGS: Brain: Diffuse cerebral atrophy. Mild ventricular dilatation consistent with central atrophy. Low-attenuation changes in the deep white matter with old lacunar infarcts consistent with small vessel ischemic change. Area of developing encephalomalacia in the right occipital lobe and right greater than left cerebellar hemispheres consistent with evolving infarcts. There is increased low-attenuation change in sulcal effacement in the left occipital lobe since previous study suggesting developing subacute infarct. The area of involvement appears more prominent than on the recent MRI. No midline shift. No abnormal extra-axial fluid collections. Gray-white matter junctions are distinct. Basal cisterns are not effaced. No acute intracranial hemorrhage. Vascular: Vascular calcifications are present. Skull: Calvarium appears intact. Sinuses/Orbits: Visualized paranasal sinuses and mastoid air cells are not opacified. Other: None. IMPRESSION: Developing low-attenuation and sulcal effacement in the left occipital lobe suggesting progressing acute infarct. Expected evolutionary changes of previous infarct in the right occipital region and bilateral cerebellum. No acute intracranial hemorrhage or significant mass effect. Electronically Signed   By: Lucienne Capers M.D.   On: 02/04/2017 06:42   Ct Head Wo Contrast  Result Date: 01/31/2017 CLINICAL DATA:  Change in mental status.  EXAM: CT HEAD WITHOUT CONTRAST TECHNIQUE: Contiguous axial images were obtained from the base of the skull through the vertex without intravenous contrast. COMPARISON:  01/10/2017 FINDINGS: Brain: Expected progressive low-density and loss of mass-effect in the areas of late subacute infarct seen previously. The most confluent infarcts are in the right cerebellum and right occipital lobe, where there is also fading petechial hemorrhage. Small infarcts along the cerebral watershed at the vertex are largely underestimated relative to prior MRI. Small patchy subacute infarcts in the left cerebellum. Improved patency of the fourth ventricle. No hydrocephalus. No detected new infarct. Vascular: No asymmetric hyperdense vessel. Atherosclerotic calcification. Skull: No acute or aggressive finding Sinuses/Orbits: Gaze to the left. IMPRESSION: 1. No acute finding. 2. Expected evolution of recent extensive cerebral and cerebellar infarction. No evidence of infarct progression. Known petechial hemorrhage without progression. 3. Decrease cytotoxic edema in the posterior fossa with normalized fourth ventricle. Electronically Signed   By: Monte Fantasia M.D.   On: 01/31/2017 13:16   Ir Fluoro Guide Cv Line Right  Result Date: 02/26/2017 INDICATION: SEPSIS, ATRIAL FIBRILLATION EXAM: ULTRASOUND AND FLUOROSCOPIC GUIDED RIGHT IJ NON TUNNELED PICC LINE INSERTION MEDICATIONS: 1% LIDOCAINE LOCALLY CONTRAST:  None FLUOROSCOPY TIME:  Thirty seconds (1 mGy) COMPLICATIONS: None immediate. TECHNIQUE: The procedure, risks, benefits, and alternatives were explained to the patient's family and informed written consent was obtained. A timeout was performed prior to the initiation of the procedure. The right neck was prepped with chlorhexidine in a sterile fashion, and a sterile drape was applied covering the operative field. Maximum barrier sterile technique with sterile gowns and gloves were used for the procedure. A timeout was performed  prior to the initiation of the procedure. Local anesthesia was provided with 1% lidocaine. Under direct ultrasound guidance, the right IJ vein was accessed with a micropuncture kit after the overlying soft tissues were anesthetized with 1% lidocaine. An ultrasound image was saved for documentation purposes. A guidewire was advanced to the level of the superior caval-atrial junction for measurement purposes and the PICC line was cut to length. A peel-away sheath was placed and a 9 cm, 5 [place], dual lumen was inserted to level  of the superior caval-atrial junction. A post procedure spot fluoroscopic was obtained. The catheter easily aspirated and flushed and was sutured in place. A dressing was placed. The patient tolerated the procedure well without immediate post procedural complication. FINDINGS: After catheter placement, the tip lies within the superior cavoatrial junction. The catheter aspirates and flushes normally and is ready for immediate use. IMPRESSION: Successful ultrasound and fluoroscopic guided placement of a right IJ vein approach, 9 cm, 5 [place], dual lumen non tunneled PICC with tip at the superior caval-atrial junction. The PICC line is ready for immediate use. Electronically Signed   By: Jerilynn Mages.  Shick M.D.   On: 02/26/2017 13:08   Ir Replc Gastro/colonic Tube Percut W/fluoro  Result Date: 02/25/2017 INDICATION: Gastrostomy tube was dislodged and patient has a Foley catheter in place. EXAM: REPLACEMENT OF GASTROSTOMY TUBE WITH FLUOROSCOPY FLUOROSCOPY TIME:  12 seconds, 1 mGy COMPLICATIONS: None immediate. MEDICATIONS: None ANESTHESIA/SEDATION: None CONTRAST:  10 mL Isovue-300 - administered into the gastric lumen. PROCEDURE: Procedure was performed by the IR technologist. Foley catheter was removed and a new 63 French balloon retention catheter was inserted. Balloon was inflated with 9 ml of saline. Contrast injection was performed. Catheter was flushed. Fluoroscopic images were taken and saved for  this procedure. FINDINGS: New 13 French gastrostomy tube is positioned in the stomach. IMPRESSION: Replacement of gastrostomy tube. Electronically Signed   By: Markus Daft M.D.   On: 02/25/2017 15:17   Ir US Guide Vasc Access Right  Result Date: 02/26/2017 INDICATION: SEPSIS, ATRIAL FIBRILLATION EXAM: ULTRASOUND AND FLUOROSCOPIC GUIDED RIGHT IJ NON TUNNELED PICC LINE INSERTION MEDICATIONS: 1% LIDOCAINE LOCALLY CONTRAST:  None FLUOROSCOPY TIME:  Thirty seconds (1 mGy) COMPLICATIONS: None immediate. TECHNIQUE: The procedure, risks, benefits, and alternatives were explained to the patient's family and informed written consent was obtained. A timeout was performed prior to the initiation of the procedure. The right neck was prepped with chlorhexidine in a sterile fashion, and a sterile drape was applied covering the operative field. Maximum barrier sterile technique with sterile gowns and gloves were used for the procedure. A timeout was performed prior to the initiation of the procedure. Local anesthesia was provided with 1% lidocaine. Under direct ultrasound guidance, the right IJ vein was accessed with a micropuncture kit after the overlying soft tissues were anesthetized with 1% lidocaine. An ultrasound image was saved for documentation purposes. A guidewire was advanced to the level of the superior caval-atrial junction for measurement purposes and the PICC line was cut to length. A peel-away sheath was placed and a 9 cm, 5 [place], dual lumen was inserted to level of the superior caval-atrial junction. A post procedure spot fluoroscopic was obtained. The catheter easily aspirated and flushed and was sutured in place. A dressing was placed. The patient tolerated the procedure well without immediate post procedural complication. FINDINGS: After catheter placement, the tip lies within the superior cavoatrial junction. The catheter aspirates and flushes normally and is ready for immediate use. IMPRESSION: Successful  ultrasound and fluoroscopic guided placement of a right IJ vein approach, 9 cm, 5 [place], dual lumen non tunneled PICC with tip at the superior caval-atrial junction. The PICC line is ready for immediate use. Electronically Signed   By: Jerilynn Mages.  Shick M.D.   On: 02/26/2017 13:08   Dg Abdomen Peg Tube Location  Result Date: 02/04/2017 CLINICAL DATA:  Gastrostomy catheter placement EXAM: ABDOMEN - 1 VIEW COMPARISON:  Study obtained earlier in the day. FINDINGS: Contrast, 50 mL Isovue-300, was injected into a  gastrostomy catheter. The gastrostomy catheter is positioned in the gastric antrum. Contrast flows into the stomach without contrast extravasation. Bowel gas pattern is normal. No free air. IMPRESSION: Gastrostomy catheter positioning gastric antrum. No contrast extravasation evident. Electronically Signed   By: Lowella Grip III M.D.   On: 02/04/2017 17:09   Dg Chest Port 1 View  Result Date: 02/24/2017 CLINICAL DATA:  Dyspnea, tracheostomy patient, recent CVA. History of colonic malignancy EXAM: PORTABLE CHEST 1 VIEW COMPARISON:  Portable chest x-ray dated February 19, 2017 FINDINGS: The patient is rotated on today's study. The lungs are hypoinflated. There is mildly increased density in the right paratracheal region as compared to the previous study. Elsewhere there is subtle patchy density at the left lung base which is stable. The heart is top-normal in size. The pulmonary vascularity is normal. The tracheostomy appliance tip projects just inferior to the inferior margin of the clavicular heads. The bony structures exhibit no acute abnormalities. IMPRESSION: Bilateral hypoinflation. There may be atelectasis or early pneumonia in the right upper lobe medially new since the previous study. Stable coarse lung markings at the left lung base. Follow-up radiographs are recommended. Electronically Signed   By: David  Martinique M.D.   On: 02/24/2017 14:41   Dg Chest Port 1 View  Result Date: 02/19/2017 CLINICAL  DATA:  Sepsis, possible aspiration pneumonia. Patient admitted 02/17/2017. EXAM: PORTABLE CHEST 1 VIEW COMPARISON:  Single-view of the chest 02/18/2017 and 02/17/2017. FINDINGS: Right basilar atelectasis seen on yesterday's examination has resolved. Small focus of left basilar airspace disease is unchanged. There is no pneumothorax. Marked cardiomegaly without edema is seen. Tracheostomy tube is in place. IMPRESSION: No change in small focus of left basilar airspace disease. Resolved right basilar atelectasis. Cardiomegaly without edema. Electronically Signed   By: Inge Rise M.D.   On: 02/19/2017 07:56   Dg Chest Port 1 View  Result Date: 02/18/2017 CLINICAL DATA:  Ventilator dependent EXAM: PORTABLE CHEST 1 VIEW COMPARISON:  01/10/2017, 02/17/2017 FINDINGS: Tracheostomy tube in satisfactory position. Mild bilateral interstitial prominence likely secondary to low lung volumes. Mild right basilar atelectasis. No significant pleural effusion or pneumothorax. Stable cardiomegaly. No acute osseous abnormality. IMPRESSION: Cardiomegaly. No active cardiopulmonary disease. Electronically Signed   By: Kathreen Devoid   On: 02/18/2017 16:21   Dg Chest Portable 1 View  Result Date: 02/17/2017 CLINICAL DATA:  Fever and tachypnea. EXAM: PORTABLE CHEST 1 VIEW COMPARISON:  02/04/2017 FINDINGS: Tracheostomy tube tip is above the carina. The heart size is mildly enlarged. There is mild interstitial edema. No airspace opacities. IMPRESSION: 1. Cardiac enlargement and mild edema. Electronically Signed   By: Kerby Moors M.D.   On: 02/17/2017 10:53   Dg Chest Port 1 View  Result Date: 02/04/2017 CLINICAL DATA:  Sepsis, shortness of breath. History of colon cancer, recent for percutaneous gastrostomy tube placement. EXAM: PORTABLE CHEST - 1 VIEW; PORTABLE ABDOMEN - 1 VIEW COMPARISON:  CT abdomen and pelvis January 25, 2017 inch chest radiograph January 25, 2017 FINDINGS: Cardiac silhouette is upper limits of normal,  mediastinal silhouette is nonsuspicious for this low inspiratory examination with crowded vasculature markings. Patient rotated to the RIGHT, unfurling the aorta. RIGHT mid lung zone bandlike density. Pleural effusions or focal consolidations. Trachea projects midline and there is no pneumothorax. Tracheostomy tube tip projects 3 cm above the carina. Included soft tissue planes and osseous structures are non-suspicious. Paucity of bowel gas on this single upright view. Single gas-filled nondistended loops below bowel in the LEFT lower quadrant. No  intra-abdominal mass effect or pathologic calcifications. No intraperitoneal free air. Soft tissue planes and included osseous structures are nonsuspicious. IMPRESSION: RIGHT midlung zone atelectasis/scarring. Borderline cardiomegaly. Nonspecific bowel gas pattern. Electronically Signed   By: Elon Alas M.D.   On: 02/04/2017 06:18   Dg Abd Portable 1v  Result Date: 02/24/2017 CLINICAL DATA:  Abdominal pain EXAM: PORTABLE ABDOMEN - 1 VIEW COMPARISON:  February 23, 2017 FINDINGS: There remains a loop of borderline dilated bowel in the mid abdomen. Elsewhere, there is somewhat of a paucity of gas. There is no air-fluid level. No free air. There are phleboliths in the pelvis. IMPRESSION: Essentially stable appearing loop of mildly dilated bowel in the mid abdomen. Generalized paucity of gas, stable. Suspect ileus with possible superimposed enteritis. Bowel obstruction felt to be less likely. No free air. Electronically Signed   By: Lowella Grip III M.D.   On: 02/24/2017 07:29   Dg Abd Portable 1v  Result Date: 02/23/2017 CLINICAL DATA:  Followup ileus. EXAM: PORTABLE ABDOMEN - 1 VIEW COMPARISON:  Yesterday. FINDINGS: The previously demonstrated dilated small bowel loop in the lower abdomen continues to have a maximum diameter of 4.3 cm. A paucity of intestinal gas is again demonstrated. Lumbar spine degenerative changes and bilateral pelvic phleboliths.  IMPRESSION: Stable mild small bowel ileus. Electronically Signed   By: Claudie Revering M.D.   On: 02/23/2017 07:40   Dg Abd Portable 1v  Result Date: 02/22/2017 CLINICAL DATA:  Followup ileus.  Previous near total colectomy. EXAM: PORTABLE ABDOMEN - 1 VIEW COMPARISON:  02/20/2017. FINDINGS: Mildly dilated small bowel in the lower abdomen currently has a maximum diameter of 4.3 cm, previously 5.2 cm. Paucity of gas in the mid and upper abdomen. No gross free peritoneal air. Lumbar and lower thoracic spine degenerative changes. IMPRESSION: Improving small bowel ileus. Electronically Signed   By: Claudie Revering M.D.   On: 02/22/2017 08:02   Dg Abd Portable 1v  Result Date: 02/20/2017 CLINICAL DATA:  Abdominal distention. Ileus. History of near total colectomy. EXAM: PORTABLE ABDOMEN - 1 VIEW COMPARISON:  02/18/2017 abdominal radiograph. 12/15/2013 CT abdomen/ pelvis. 01/25/2017 unenhanced CT abdomen. FINDINGS: Percutaneous gastrostomy catheter overlies the body of the stomach. Persistent gaseous distension of small bowel loop in the lower abdomen up to 5.2 cm diameter, not appreciably changed. No evidence of pneumatosis or pneumoperitoneum. No radiopaque urolithiasis. IMPRESSION: Stable dilated small bowel loop in the lower abdomen compatible with the provided history of adynamic ileus. No evidence of free air. Electronically Signed   By: Ilona Sorrel M.D.   On: 02/20/2017 10:30   Dg Abd Portable 1v  Result Date: 02/18/2017 CLINICAL DATA:  Hypoactive EXAM: PORTABLE ABDOMEN - 1 VIEW COMPARISON:  None. FINDINGS: Gaseous distended small bowel loops are noted mid lower abdomen suspicious for ileus or early bowel obstruction. IMPRESSION: Gaseous distended small bowel loops are noted mid lower abdomen suspicious for ileus or early bowel obstruction. Electronically Signed   By: Lahoma Crocker M.D.   On: 02/18/2017 20:22   Dg Abd Portable 1 View  Result Date: 02/04/2017 CLINICAL DATA:  Sepsis, shortness of breath.  History of colon cancer, recent for percutaneous gastrostomy tube placement. EXAM: PORTABLE CHEST - 1 VIEW; PORTABLE ABDOMEN - 1 VIEW COMPARISON:  CT abdomen and pelvis January 25, 2017 inch chest radiograph January 25, 2017 FINDINGS: Cardiac silhouette is upper limits of normal, mediastinal silhouette is nonsuspicious for this low inspiratory examination with crowded vasculature markings. Patient rotated to the RIGHT, unfurling the aorta. RIGHT mid  lung zone bandlike density. Pleural effusions or focal consolidations. Trachea projects midline and there is no pneumothorax. Tracheostomy tube tip projects 3 cm above the carina. Included soft tissue planes and osseous structures are non-suspicious. Paucity of bowel gas on this single upright view. Single gas-filled nondistended loops below bowel in the LEFT lower quadrant. No intra-abdominal mass effect or pathologic calcifications. No intraperitoneal free air. Soft tissue planes and included osseous structures are nonsuspicious. IMPRESSION: RIGHT midlung zone atelectasis/scarring. Borderline cardiomegaly. Nonspecific bowel gas pattern. Electronically Signed   By: Elon Alas M.D.   On: 02/04/2017 06:18    Lab Data:  CBC:  Recent Labs Lab 02/23/17 0309 02/24/17 0301 02/25/17 0252 02/26/17 0251 02/27/17 0252  WBC 25.1* 28.2* 28.3* 26.4* 29.8*  HGB 8.1* 8.2* 8.7* 8.1* 8.1*  HCT 24.7* 25.2* 26.8* 25.8* 25.8*  MCV 83.7 83.2 83.5 84.3 83.8  PLT 189 273 304 238 470   Basic Metabolic Panel:  Recent Labs Lab 02/22/17 0239 02/23/17 0309 02/24/17 0301 02/26/17 0251 02/27/17 0252  NA 139 139 142 143 141  K 4.2 5.0 4.3 3.9 4.2  CL 107 111 111 113* 110  CO2 14* 9* 12* 12* 13*  GLUCOSE 290* 207* 244* 207* 265*  BUN 79* 84* 90* 102* 105*  CREATININE 2.26* 2.56* 2.94* 3.69* 4.12*  CALCIUM 7.5* 7.6* 7.9* 8.2* 8.1*   GFR: Estimated Creatinine Clearance: 12.2 mL/min (A) (by C-G formula based on SCr of 4.12 mg/dL (H)). Liver Function Tests: No  results for input(s): AST, ALT, ALKPHOS, BILITOT, PROT, ALBUMIN in the last 168 hours. No results for input(s): LIPASE, AMYLASE in the last 168 hours. No results for input(s): AMMONIA in the last 168 hours. Coagulation Profile: No results for input(s): INR, PROTIME in the last 168 hours. Cardiac Enzymes: No results for input(s): CKTOTAL, CKMB, CKMBINDEX, TROPONINI in the last 168 hours. BNP (last 3 results) No results for input(s): PROBNP in the last 8760 hours. HbA1C: No results for input(s): HGBA1C in the last 72 hours. CBG:  Recent Labs Lab 02/27/17 2034 02/27/17 2327 02/28/17 0312 02/28/17 0851 02/28/17 1301  GLUCAP 209* 226* 227* 179* 124*   Lipid Profile: No results for input(s): CHOL, HDL, LDLCALC, TRIG, CHOLHDL, LDLDIRECT in the last 72 hours. Thyroid Function Tests: No results for input(s): TSH, T4TOTAL, FREET4, T3FREE, THYROIDAB in the last 72 hours. Anemia Panel: No results for input(s): VITAMINB12, FOLATE, FERRITIN, TIBC, IRON, RETICCTPCT in the last 72 hours. Urine analysis:    Component Value Date/Time   COLORURINE AMBER (A) 02/17/2017 1315   APPEARANCEUR HAZY (A) 02/17/2017 1315   LABSPEC 1.028 02/17/2017 1315   PHURINE 5.0 02/17/2017 1315   GLUCOSEU 50 (A) 02/17/2017 1315   HGBUR SMALL (A) 02/17/2017 1315   BILIRUBINUR SMALL (A) 02/17/2017 1315   KETONESUR NEGATIVE 02/17/2017 1315   PROTEINUR 30 (A) 02/17/2017 1315   NITRITE NEGATIVE 02/17/2017 1315   LEUKOCYTESUR NEGATIVE 02/17/2017 [place] M.D. [facility] 02/28/2017, 2:15 PM  Pager: [phone] Between 7am to 7pm - call Pager - [phone]  After 7pm go to [email or web address] - password [email or web address]  Call night coverage person covering after 7pm

## 2017-03-01 LAB — GLUCOSE, CAPILLARY
GLUCOSE-CAPILLARY: 130 mg/dL — AB (ref 65–99)
Glucose-Capillary: 116 mg/dL — ABNORMAL HIGH (ref 65–99)
Glucose-Capillary: 99 mg/dL (ref 65–99)
Glucose-Capillary: 119 mg/dL — ABNORMAL HIGH (ref 65–99)
Glucose-Capillary: 165 mg/dL — ABNORMAL HIGH (ref 65–99)

## 2017-03-01 MED ORDER — PIPERACILLIN-TAZOBACTAM IN DEX 2-0.25 GM/50ML IV SOLN
2.2500 g | Freq: Four times a day (QID) | INTRAVENOUS | Status: AC
  Administered 2017-03-01: 2.25 g via INTRAVENOUS
  Filled 2017-03-01: qty 50

## 2017-03-01 NOTE — Progress Notes (Signed)
[facility]                                                                              Patient Demographics  April Franklin, is a 76 y.o. female, DOB - 21-Feb-1941, FAO:[id number]  Admit date - 02/17/2017   Admitting Physician April Dickens, MD  Outpatient Primary MD for the patient is April Low, MD  Outpatient specialists:   LOS - 12  days    Chief Complaint  Patient presents with  . 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 Chief complaint was leaking G-tube with intermittent fevers.  X ray shows ileus vs early obstruction.  Patient also had propagation of lower extremity DVT.   Overall prognosis very poor and family has vascillated with regards to her overall GOC. Currently pt is DNR.  Palliative care has met with family again and family wishes to pursue treatment of treatable conditions.  There are multiple family members involved in patient's care.     Assessment & Plan    ETHICS: -this is a 75/F with adenocA colon with liver mets, recent multiple hospitalization with strokes with PEG and Trach, bed bound, non verbal and poorly responsive at baseline admitted with N/Vomiting, Sepsis, Aspiration seen by Palliative care last few admissions, family resistant to Full Comfort care, they are still hopeful for recovery and a miracle despite being told regularly by medical professionals that she is dying. -discussed this yet again with daughter and April Lessen, NP-Palliative medicine, and then again 4/5 pm with all 3 daughters and spouse to progress with comfort measures, after this meeting they agreed with stopping amio/heparin,  stopping blood draws, they want to keep tube feeds, CBGs etc -discussed with son today to have all family members available in room tomorrow to discuss disposition etc  SIRS/Sepsis likely due to aspiration/sacral decub wounds - Franklin-grade fevers with tachypnea, tachycardia, leukocytosis, elevated lactic acid, acute kidney injury, encephalopathy.  - Patient was placed on IV fluid resuscitation and broad-spectrum antibiotics, currently on IV Zosyn.  - C. difficile negative, MRSA PCR screening negative, no UTI.   - Chest x-ray 4/2 showed bilateral hypo-inflation, atelectasis or early pneumonia in the right upper lobe new since the previous study. Still has profound leukocytosis.  - On IV Zosyn Day 13 now-no significant improvement despite this, but afebrile now - Stop Zosyn after todays dose  Stage 4 adenocarcinoma with mets to liver -very poor prognosis -not candidate for any therapy  Acute kidney injury.  -due to sepsis and third spacing, clinically volume overloaded with 2plus edema but intravascularly dry  -Creatinine trending up with positive balance of 13 L, IV fluids discontinued and placed on IV Lasix, then held due to rise in craetinine -Hold nephrotoxins, off Vanc -creatinine trending up  Abdominal distention with ileus with history of near total colectomy from adenocarcinoma colon/metastatic -  Abdominal x-ray  4/2 showed stable ileus - patient was started on trickle tube feeds, however overnight PEG tube came out and Foley was placed in the track. -s/p PEG tube replacement per Dr.Rai -tolerating tube feeds   Acute on chronic respiratory failure. Status post tracheostomy 2 hospitalizations ago. At that time, was also treated for aspiration pneumonia.  -Chest x-ray as noted above. ABG with pH of 7.51, PCO2 26.7, PO2 87 bicarb 21.7. -See #1, continue antitussives, scopolamine, O2 supplementation as indicated, on IV Zosyn  Likely aspirating, continue IV Zosyn -also was on IV  lasix-now held   Acute on chronic encephalopathy. Likely related to above in setting of recent stroke. Recent CT showed progressing of acute infarct on the left.  - Previously followed by neurology and discharged on 3/9 by Dr. [name], multiple medical issues, overall poor prognosis - Currently not following any commands, palliative care has been following - Aspirin stopped last hospitalization   Hypertension. Home meds include losartan and beta blocker. -now held   Diabetes. Serum glucose 155 on admission. Recent hemoglobin A1c is 7.2. Home medications include Lantus -Hold Lantus for now -Sliding scale insulin for optimal control  Lower extremity edema. right worse than left  - DVT 2 hospitalizations ago.  Duplex had shown worsening DVT, patient is not ambulatory and hypercoagulable from adenocarcinoma of colon with metastasis - Anticoagulation stopped last hospitalization due to anemia. Synovial fluid aspirated in ED, cultures negative   Anemia- - suspect decrease in Hgb from volume dilution. Unclear source. During previous hospitalization, Dr [name] had discussed case with GI who did not recommend endoscopic evaluation given significant cardiovascular risk involved - follow closely, transfused 1 unit pRBC per Dr.Rai earlier this admission  Recent NSTEMI - no echo done since increased troponin - no intervention possible due to patient's poor prognosis/current state - Cardiology following  Sacral Pressure Injury 02/17/17 appears unstageable per nusing -WOC consult  Paroxysmal Atrial Fib with RVR  - on 3/30, patient went into rapid atrial fibrillation with RVR, cardiology was consulted and recommended starting IV amiodarone and heparin -now both stopped  Code Status: dnr  DVT Prophylaxis:  Hep gtt stopped Family Communication: Discussed in detail with daughters almost everyday, son at bedside today Disposition Plan:  Unclear, Hospital death vs Home with hospice recommended,  Comfort Care recommended to family everyday  Time Spent in minutes 25 minutes  Procedures:  Abdominal x-ray cxr   Consultants:   Palliative care Cardiology   Antimicrobials:   Zosyn 3/27 >>   Medications  Scheduled Meds: . chlorhexidine  15 mL Mouth Rinse BID  . feeding supplement (GLUCERNA 1.2 CAL)  1,000 mL Per Tube Q24H  . insulin aspart  0-9 Units Subcutaneous Q4H  . insulin glargine  10 Units Subcutaneous QHS  . mouth rinse  15 mL Mouth Rinse q12n4p  . pantoprazole (PROTONIX) IV  40 mg Intravenous Q24H  . piperacillin-tazobactam (ZOSYN)  IV  2.25 g Intravenous Q6H  . sodium chloride flush  3 mL Intravenous Q12H   Continuous Infusions:  PRN Meds:.acetaminophen **OR** acetaminophen, albuterol, glycopyrrolate, morphine injection, ondansetron **OR** ondansetron (ZOFRAN) IV   Antibiotics   Anti-infectives    Start     Dose/Rate Route Frequency Ordered Stop   03/01/17 1430  piperacillin-tazobactam (ZOSYN) IVPB 2.25 g     2.25 g 100 mL/hr over 30 Minutes Intravenous Every 6 hours 03/01/17 1148 03/01/17 2029   02/21/17 0830  piperacillin-tazobactam (ZOSYN) IVPB 2.25 g  Status:  Discontinued     2.25 g 100 mL/hr  over 30 Minutes Intravenous Every 6 hours 02/21/17 0805 03/01/17 1148   02/21/17 0815  piperacillin-tazobactam (ZOSYN) IVPB 2.25 g  Status:  Discontinued     2.25 g 100 mL/hr over 30 Minutes Intravenous Every 6 hours 02/21/17 0802 02/21/17 0805   02/18/17 1200  vancomycin (VANCOCIN) IVPB 1000 mg/200 mL premix     1,000 mg 200 mL/hr over 60 Minutes Intravenous Every 24 hours 02/17/17 1108 02/19/17 1210   02/17/17 1600  piperacillin-tazobactam (ZOSYN) IVPB 3.375 g  Status:  Discontinued     3.375 g 12.5 mL/hr over 240 Minutes Intravenous Every 8 hours 02/17/17 1108 02/21/17 0801   02/17/17 1330  piperacillin-tazobactam (ZOSYN) IVPB 3.375 g  Status:  Discontinued     3.375 g 100 mL/hr over 30 Minutes Intravenous  Once 02/17/17 1317 02/17/17 1322   02/17/17  1330  vancomycin (VANCOCIN) IVPB 1000 mg/200 mL premix  Status:  Discontinued     1,000 mg 200 mL/hr over 60 Minutes Intravenous  Once 02/17/17 1317 02/17/17 1322   02/17/17 1000  piperacillin-tazobactam (ZOSYN) IVPB 3.375 g     3.375 g 100 mL/hr over 30 Minutes Intravenous  Once 02/17/17 0945 02/17/17 1038   02/17/17 1000  vancomycin (VANCOCIN) IVPB 1000 mg/200 mL premix  Status:  Discontinued     1,000 mg 200 mL/hr over 60 Minutes Intravenous  Once 02/17/17 0945 02/17/17 0953   02/17/17 1000  vancomycin (VANCOCIN) 1,250 mg in sodium chloride 0.9 % 250 mL IVPB     1,250 mg 166.7 mL/hr over 90 Minutes Intravenous  Once 02/17/17 0953 02/17/17 1306        Subjective:   Unresponsive to any verbal commands.  No new events, no distress  Objective:   Vitals:   03/01/17 0740 03/01/17 0800 03/01/17 0811 03/01/17 1152  BP: (!) 108/42 (!) 118/42    Pulse:  71 76 79  Resp:  (!) 24 (!) 24 (!) 22  Temp: 97.1 F (36.2 C)     TempSrc: Axillary     SpO2:  95% 97% 97%  Weight:      Height:        Intake/Output Summary (Last 24 hours) at 03/01/17 1154 Last data filed at 03/01/17 1031  Gross per 24 hour  Intake               50 ml  Output                0 ml  Net               50 ml     Wt Readings from Last 3 Encounters:  03/01/17 86.6 kg (191 lb)  02/04/17 65.2 kg (143 lb 11.2 oz)  01/31/17 63.2 kg (139 lb 5.3 oz)     Exam  General:Unresponsive, not responding to any verbal or painful stimuli  Neck: Supple, trach +  Cardiovascular: S1 S2/RRR  Respiratory: coarse breath sounds throughout, worse in RUL  Gastrointestinal : Soft, nontender  Ext: cyanotic changes noted in both feet, 1-2plus edema diffusely  Neuro: obtunded  Skin: No rashes, discoloration of toes  Psych:  Unable to assess  Data Reviewed:  I have personally reviewed following labs and imaging studies  Micro Results Recent Results (from the past 240 hour(s))  C difficile quick scan w PCR reflex      Status: None   Collection Time: 02/23/17  1:47 PM  Result Value Ref Range Status   C Diff antigen NEGATIVE NEGATIVE Final  C Diff toxin NEGATIVE NEGATIVE Final   C Diff interpretation No C. difficile detected.  Final    Radiology Reports Ct Head Wo Contrast  Result Date: 02/04/2017 CLINICAL DATA:  Sepsis.  Unresponsive for several days. EXAM: CT HEAD WITHOUT CONTRAST TECHNIQUE: Contiguous axial images were obtained from the base of the skull through the vertex without intravenous contrast. COMPARISON:  01/31/2017 FINDINGS: Brain: Diffuse cerebral atrophy. Mild ventricular dilatation consistent with central atrophy. Franklin-attenuation changes in the deep white matter with old lacunar infarcts consistent with small vessel ischemic change. Area of developing encephalomalacia in the right occipital lobe and right greater than left cerebellar hemispheres consistent with evolving infarcts. There is increased Franklin-attenuation change in sulcal effacement in the left occipital lobe since previous study suggesting developing subacute infarct. The area of involvement appears more prominent than on the recent MRI. No midline shift. No abnormal extra-axial fluid collections. Gray-white matter junctions are distinct. Basal cisterns are not effaced. No acute intracranial hemorrhage. Vascular: Vascular calcifications are present. Skull: Calvarium appears intact. Sinuses/Orbits: Visualized paranasal sinuses and mastoid air cells are not opacified. Other: None. IMPRESSION: Developing Franklin-attenuation and sulcal effacement in the left occipital lobe suggesting progressing acute infarct. Expected evolutionary changes of previous infarct in the right occipital region and bilateral cerebellum. No acute intracranial hemorrhage or significant mass effect. Electronically Signed   By: Lucienne Capers M.D.   On: 02/04/2017 06:42   Ct Head Wo Contrast  Result Date: 01/31/2017 CLINICAL DATA:  Change in mental status. EXAM: CT HEAD  WITHOUT CONTRAST TECHNIQUE: Contiguous axial images were obtained from the base of the skull through the vertex without intravenous contrast. COMPARISON:  01/10/2017 FINDINGS: Brain: Expected progressive Franklin-density and loss of mass-effect in the areas of late subacute infarct seen previously. The most confluent infarcts are in the right cerebellum and right occipital lobe, where there is also fading petechial hemorrhage. Small infarcts along the cerebral watershed at the vertex are largely underestimated relative to prior MRI. Small patchy subacute infarcts in the left cerebellum. Improved patency of the fourth ventricle. No hydrocephalus. No detected new infarct. Vascular: No asymmetric hyperdense vessel. Atherosclerotic calcification. Skull: No acute or aggressive finding Sinuses/Orbits: Gaze to the left. IMPRESSION: 1. No acute finding. 2. Expected evolution of recent extensive cerebral and cerebellar infarction. No evidence of infarct progression. Known petechial hemorrhage without progression. 3. Decrease cytotoxic edema in the posterior fossa with normalized fourth ventricle. Electronically Signed   By: Monte Fantasia M.D.   On: 01/31/2017 13:16   Ir Fluoro Guide Cv Line Right  Result Date: 02/26/2017 INDICATION: SEPSIS, ATRIAL FIBRILLATION EXAM: ULTRASOUND AND FLUOROSCOPIC GUIDED RIGHT IJ NON TUNNELED PICC LINE INSERTION MEDICATIONS: 1% LIDOCAINE LOCALLY CONTRAST:  None FLUOROSCOPY TIME:  Thirty seconds (1 mGy) COMPLICATIONS: None immediate. TECHNIQUE: The procedure, risks, benefits, and alternatives were explained to the patient's family and informed written consent was obtained. A timeout was performed prior to the initiation of the procedure. The right neck was prepped with chlorhexidine in a sterile fashion, and a sterile drape was applied covering the operative field. Maximum barrier sterile technique with sterile gowns and gloves were used for the procedure. A timeout was performed prior to the  initiation of the procedure. Local anesthesia was provided with 1% lidocaine. Under direct ultrasound guidance, the right IJ vein was accessed with a micropuncture kit after the overlying soft tissues were anesthetized with 1% lidocaine. An ultrasound image was saved for documentation purposes. A guidewire was advanced to the level of the superior caval-atrial  junction for measurement purposes and the PICC line was cut to length. A peel-away sheath was placed and a 9 cm, 5 [place], dual lumen was inserted to level of the superior caval-atrial junction. A post procedure spot fluoroscopic was obtained. The catheter easily aspirated and flushed and was sutured in place. A dressing was placed. The patient tolerated the procedure well without immediate post procedural complication. FINDINGS: After catheter placement, the tip lies within the superior cavoatrial junction. The catheter aspirates and flushes normally and is ready for immediate use. IMPRESSION: Successful ultrasound and fluoroscopic guided placement of a right IJ vein approach, 9 cm, 5 [place], dual lumen non tunneled PICC with tip at the superior caval-atrial junction. The PICC line is ready for immediate use. Electronically Signed   By: Jerilynn Mages.  Shick M.D.   On: 02/26/2017 13:08   Ir Replc Gastro/colonic Tube Percut W/fluoro  Result Date: 02/25/2017 INDICATION: Gastrostomy tube was dislodged and patient has a Foley catheter in place. EXAM: REPLACEMENT OF GASTROSTOMY TUBE WITH FLUOROSCOPY FLUOROSCOPY TIME:  12 seconds, 1 mGy COMPLICATIONS: None immediate. MEDICATIONS: None ANESTHESIA/SEDATION: None CONTRAST:  10 mL Isovue-300 - administered into the gastric lumen. PROCEDURE: Procedure was performed by the IR technologist. Foley catheter was removed and a new 30 French balloon retention catheter was inserted. Balloon was inflated with 9 ml of saline. Contrast injection was performed. Catheter was flushed. Fluoroscopic images were taken and saved for this  procedure. FINDINGS: New 10 French gastrostomy tube is positioned in the stomach. IMPRESSION: Replacement of gastrostomy tube. Electronically Signed   By: Markus Daft M.D.   On: 02/25/2017 15:17   Ir US Guide Vasc Access Right  Result Date: 02/26/2017 INDICATION: SEPSIS, ATRIAL FIBRILLATION EXAM: ULTRASOUND AND FLUOROSCOPIC GUIDED RIGHT IJ NON TUNNELED PICC LINE INSERTION MEDICATIONS: 1% LIDOCAINE LOCALLY CONTRAST:  None FLUOROSCOPY TIME:  Thirty seconds (1 mGy) COMPLICATIONS: None immediate. TECHNIQUE: The procedure, risks, benefits, and alternatives were explained to the patient's family and informed written consent was obtained. A timeout was performed prior to the initiation of the procedure. The right neck was prepped with chlorhexidine in a sterile fashion, and a sterile drape was applied covering the operative field. Maximum barrier sterile technique with sterile gowns and gloves were used for the procedure. A timeout was performed prior to the initiation of the procedure. Local anesthesia was provided with 1% lidocaine. Under direct ultrasound guidance, the right IJ vein was accessed with a micropuncture kit after the overlying soft tissues were anesthetized with 1% lidocaine. An ultrasound image was saved for documentation purposes. A guidewire was advanced to the level of the superior caval-atrial junction for measurement purposes and the PICC line was cut to length. A peel-away sheath was placed and a 9 cm, 5 [place], dual lumen was inserted to level of the superior caval-atrial junction. A post procedure spot fluoroscopic was obtained. The catheter easily aspirated and flushed and was sutured in place. A dressing was placed. The patient tolerated the procedure well without immediate post procedural complication. FINDINGS: After catheter placement, the tip lies within the superior cavoatrial junction. The catheter aspirates and flushes normally and is ready for immediate use. IMPRESSION: Successful  ultrasound and fluoroscopic guided placement of a right IJ vein approach, 9 cm, 5 [place], dual lumen non tunneled PICC with tip at the superior caval-atrial junction. The PICC line is ready for immediate use. Electronically Signed   By: Jerilynn Mages.  Shick M.D.   On: 02/26/2017 13:08   Dg Abdomen Peg Tube Location  Result Date:  02/04/2017 CLINICAL DATA:  Gastrostomy catheter placement EXAM: ABDOMEN - 1 VIEW COMPARISON:  Study obtained earlier in the day. FINDINGS: Contrast, 50 mL Isovue-300, was injected into a gastrostomy catheter. The gastrostomy catheter is positioned in the gastric antrum. Contrast flows into the stomach without contrast extravasation. Bowel gas pattern is normal. No free air. IMPRESSION: Gastrostomy catheter positioning gastric antrum. No contrast extravasation evident. Electronically Signed   By: Lowella Grip III M.D.   On: 02/04/2017 17:09   Dg Chest Port 1 View  Result Date: 02/24/2017 CLINICAL DATA:  Dyspnea, tracheostomy patient, recent CVA. History of colonic malignancy EXAM: PORTABLE CHEST 1 VIEW COMPARISON:  Portable chest x-ray dated February 19, 2017 FINDINGS: The patient is rotated on today's study. The lungs are hypoinflated. There is mildly increased density in the right paratracheal region as compared to the previous study. Elsewhere there is subtle patchy density at the left lung base which is stable. The heart is top-normal in size. The pulmonary vascularity is normal. The tracheostomy appliance tip projects just inferior to the inferior margin of the clavicular heads. The bony structures exhibit no acute abnormalities. IMPRESSION: Bilateral hypoinflation. There may be atelectasis or early pneumonia in the right upper lobe medially new since the previous study. Stable coarse lung markings at the left lung base. Follow-up radiographs are recommended. Electronically Signed   By: David  Martinique M.D.   On: 02/24/2017 14:41   Dg Chest Port 1 View  Result Date: 02/19/2017 CLINICAL  DATA:  Sepsis, possible aspiration pneumonia. Patient admitted 02/17/2017. EXAM: PORTABLE CHEST 1 VIEW COMPARISON:  Single-view of the chest 02/18/2017 and 02/17/2017. FINDINGS: Right basilar atelectasis seen on yesterday's examination has resolved. Small focus of left basilar airspace disease is unchanged. There is no pneumothorax. Marked cardiomegaly without edema is seen. Tracheostomy tube is in place. IMPRESSION: No change in small focus of left basilar airspace disease. Resolved right basilar atelectasis. Cardiomegaly without edema. Electronically Signed   By: Inge Rise M.D.   On: 02/19/2017 07:56   Dg Chest Port 1 View  Result Date: 02/18/2017 CLINICAL DATA:  Ventilator dependent EXAM: PORTABLE CHEST 1 VIEW COMPARISON:  01/10/2017, 02/17/2017 FINDINGS: Tracheostomy tube in satisfactory position. Mild bilateral interstitial prominence likely secondary to Franklin lung volumes. Mild right basilar atelectasis. No significant pleural effusion or pneumothorax. Stable cardiomegaly. No acute osseous abnormality. IMPRESSION: Cardiomegaly. No active cardiopulmonary disease. Electronically Signed   By: Kathreen Devoid   On: 02/18/2017 16:21   Dg Chest Portable 1 View  Result Date: 02/17/2017 CLINICAL DATA:  Fever and tachypnea. EXAM: PORTABLE CHEST 1 VIEW COMPARISON:  02/04/2017 FINDINGS: Tracheostomy tube tip is above the carina. The heart size is mildly enlarged. There is mild interstitial edema. No airspace opacities. IMPRESSION: 1. Cardiac enlargement and mild edema. Electronically Signed   By: Kerby Moors M.D.   On: 02/17/2017 10:53   Dg Chest Port 1 View  Result Date: 02/04/2017 CLINICAL DATA:  Sepsis, shortness of breath. History of colon cancer, recent for percutaneous gastrostomy tube placement. EXAM: PORTABLE CHEST - 1 VIEW; PORTABLE ABDOMEN - 1 VIEW COMPARISON:  CT abdomen and pelvis January 25, 2017 inch chest radiograph January 25, 2017 FINDINGS: Cardiac silhouette is upper limits of normal,  mediastinal silhouette is nonsuspicious for this Franklin inspiratory examination with crowded vasculature markings. Patient rotated to the RIGHT, unfurling the aorta. RIGHT mid lung zone bandlike density. Pleural effusions or focal consolidations. Trachea projects midline and there is no pneumothorax. Tracheostomy tube tip projects 3 cm above the carina. Included  soft tissue planes and osseous structures are non-suspicious. Paucity of bowel gas on this single upright view. Single gas-filled nondistended loops below bowel in the LEFT lower quadrant. No intra-abdominal mass effect or pathologic calcifications. No intraperitoneal free air. Soft tissue planes and included osseous structures are nonsuspicious. IMPRESSION: RIGHT midlung zone atelectasis/scarring. Borderline cardiomegaly. Nonspecific bowel gas pattern. Electronically Signed   By: Elon Alas M.D.   On: 02/04/2017 06:18   Dg Abd Portable 1v  Result Date: 02/24/2017 CLINICAL DATA:  Abdominal pain EXAM: PORTABLE ABDOMEN - 1 VIEW COMPARISON:  February 23, 2017 FINDINGS: There remains a loop of borderline dilated bowel in the mid abdomen. Elsewhere, there is somewhat of a paucity of gas. There is no air-fluid level. No free air. There are phleboliths in the pelvis. IMPRESSION: Essentially stable appearing loop of mildly dilated bowel in the mid abdomen. Generalized paucity of gas, stable. Suspect ileus with possible superimposed enteritis. Bowel obstruction felt to be less likely. No free air. Electronically Signed   By: Lowella Grip III M.D.   On: 02/24/2017 07:29   Dg Abd Portable 1v  Result Date: 02/23/2017 CLINICAL DATA:  Followup ileus. EXAM: PORTABLE ABDOMEN - 1 VIEW COMPARISON:  Yesterday. FINDINGS: The previously demonstrated dilated small bowel loop in the lower abdomen continues to have a maximum diameter of 4.3 cm. A paucity of intestinal gas is again demonstrated. Lumbar spine degenerative changes and bilateral pelvic phleboliths.  IMPRESSION: Stable mild small bowel ileus. Electronically Signed   By: Claudie Revering M.D.   On: 02/23/2017 07:40   Dg Abd Portable 1v  Result Date: 02/22/2017 CLINICAL DATA:  Followup ileus.  Previous near total colectomy. EXAM: PORTABLE ABDOMEN - 1 VIEW COMPARISON:  02/20/2017. FINDINGS: Mildly dilated small bowel in the lower abdomen currently has a maximum diameter of 4.3 cm, previously 5.2 cm. Paucity of gas in the mid and upper abdomen. No gross free peritoneal air. Lumbar and lower thoracic spine degenerative changes. IMPRESSION: Improving small bowel ileus. Electronically Signed   By: Claudie Revering M.D.   On: 02/22/2017 08:02   Dg Abd Portable 1v  Result Date: 02/20/2017 CLINICAL DATA:  Abdominal distention. Ileus. History of near total colectomy. EXAM: PORTABLE ABDOMEN - 1 VIEW COMPARISON:  02/18/2017 abdominal radiograph. 12/15/2013 CT abdomen/ pelvis. 01/25/2017 unenhanced CT abdomen. FINDINGS: Percutaneous gastrostomy catheter overlies the body of the stomach. Persistent gaseous distension of small bowel loop in the lower abdomen up to 5.2 cm diameter, not appreciably changed. No evidence of pneumatosis or pneumoperitoneum. No radiopaque urolithiasis. IMPRESSION: Stable dilated small bowel loop in the lower abdomen compatible with the provided history of adynamic ileus. No evidence of free air. Electronically Signed   By: Ilona Sorrel M.D.   On: 02/20/2017 10:30   Dg Abd Portable 1v  Result Date: 02/18/2017 CLINICAL DATA:  Hypoactive EXAM: PORTABLE ABDOMEN - 1 VIEW COMPARISON:  None. FINDINGS: Gaseous distended small bowel loops are noted mid lower abdomen suspicious for ileus or early bowel obstruction. IMPRESSION: Gaseous distended small bowel loops are noted mid lower abdomen suspicious for ileus or early bowel obstruction. Electronically Signed   By: Lahoma Crocker M.D.   On: 02/18/2017 20:22   Dg Abd Portable 1 View  Result Date: 02/04/2017 CLINICAL DATA:  Sepsis, shortness of breath.  History of colon cancer, recent for percutaneous gastrostomy tube placement. EXAM: PORTABLE CHEST - 1 VIEW; PORTABLE ABDOMEN - 1 VIEW COMPARISON:  CT abdomen and pelvis January 25, 2017 inch chest radiograph January 25, 2017 FINDINGS: Cardiac  silhouette is upper limits of normal, mediastinal silhouette is nonsuspicious for this [place] inspiratory examination with crowded vasculature markings. Patient rotated to the RIGHT, unfurling the aorta. RIGHT mid lung zone bandlike density. Pleural effusions or focal consolidations. Trachea projects midline and there is no pneumothorax. Tracheostomy tube tip projects 3 cm above the carina. Included soft tissue planes and osseous structures are non-suspicious. Paucity of bowel gas on this single upright view. Single gas-filled nondistended loops below bowel in the LEFT lower quadrant. No intra-abdominal mass effect or pathologic calcifications. No intraperitoneal free air. Soft tissue planes and included osseous structures are nonsuspicious. IMPRESSION: RIGHT midlung zone atelectasis/scarring. Borderline cardiomegaly. Nonspecific bowel gas pattern. Electronically Signed   By: Elon Alas M.D.   On: 02/04/2017 06:18    Lab Data:  CBC:  Recent Labs Lab 02/23/17 0309 02/24/17 0301 02/25/17 0252 02/26/17 0251 02/27/17 0252  WBC 25.1* 28.2* 28.3* 26.4* 29.8*  HGB 8.1* 8.2* 8.7* 8.1* 8.1*  HCT 24.7* 25.2* 26.8* 25.8* 25.8*  MCV 83.7 83.2 83.5 84.3 83.8  PLT 189 273 304 238 024   Basic Metabolic Panel:  Recent Labs Lab 02/23/17 0309 02/24/17 0301 02/26/17 0251 02/27/17 0252  NA 139 142 143 141  K 5.0 4.3 3.9 4.2  CL 111 111 113* 110  CO2 9* 12* 12* 13*  GLUCOSE 207* 244* 207* 265*  BUN 84* 90* 102* 105*  CREATININE 2.56* 2.94* 3.69* 4.12*  CALCIUM 7.6* 7.9* 8.2* 8.1*   GFR: Estimated Creatinine Clearance: 12.2 mL/min (A) (by C-G formula based on SCr of 4.12 mg/dL (H)). Liver Function Tests: No results for input(s): AST, ALT, ALKPHOS, BILITOT, PROT,  ALBUMIN in the last 168 hours. No results for input(s): LIPASE, AMYLASE in the last 168 hours. No results for input(s): AMMONIA in the last 168 hours. Coagulation Profile: No results for input(s): INR, PROTIME in the last 168 hours. Cardiac Enzymes: No results for input(s): CKTOTAL, CKMB, CKMBINDEX, TROPONINI in the last 168 hours. BNP (last 3 results) No results for input(s): PROBNP in the last 8760 hours. HbA1C: No results for input(s): HGBA1C in the last 72 hours. CBG:  Recent Labs Lab 02/28/17 1708 02/28/17 2034 02/28/17 2346 03/01/17 0352 03/01/17 0740  GLUCAP 122* 155* 162* 116* 99   Lipid Profile: No results for input(s): CHOL, HDL, LDLCALC, TRIG, CHOLHDL, LDLDIRECT in the last 72 hours. Thyroid Function Tests: No results for input(s): TSH, T4TOTAL, FREET4, T3FREE, THYROIDAB in the last 72 hours. Anemia Panel: No results for input(s): VITAMINB12, FOLATE, FERRITIN, TIBC, IRON, RETICCTPCT in the last 72 hours. Urine analysis:    Component Value Date/Time   COLORURINE AMBER (A) 02/17/2017 1315   APPEARANCEUR HAZY (A) 02/17/2017 1315   LABSPEC 1.028 02/17/2017 1315   PHURINE 5.0 02/17/2017 1315   GLUCOSEU 50 (A) 02/17/2017 1315   HGBUR SMALL (A) 02/17/2017 1315   BILIRUBINUR SMALL (A) 02/17/2017 1315   KETONESUR NEGATIVE 02/17/2017 1315   PROTEINUR 30 (A) 02/17/2017 1315   NITRITE NEGATIVE 02/17/2017 1315   LEUKOCYTESUR NEGATIVE 02/17/2017 Moriarty M.D. [facility] 03/01/2017, 11:54 AM  Pager: [phone] Between 7am to 7pm - call Pager - [phone]  After 7pm go to [email or web address] - password [email or web address]  Call night coverage person covering after 7pm

## 2017-03-01 NOTE — Progress Notes (Signed)
Pt passed a moderate amount of bright red stool. MD notified. Will continue to monitor.

## 2017-03-02 DIAGNOSIS — I4891 Unspecified atrial fibrillation: Secondary | ICD-10-CM

## 2017-03-02 LAB — GLUCOSE, CAPILLARY
GLUCOSE-CAPILLARY: 137 mg/dL — AB (ref 65–99)
GLUCOSE-CAPILLARY: 154 mg/dL — AB (ref 65–99)
GLUCOSE-CAPILLARY: 183 mg/dL — AB (ref 65–99)
GLUCOSE-CAPILLARY: 84 mg/dL (ref 65–99)
Glucose-Capillary: 122 mg/dL — ABNORMAL HIGH (ref 65–99)
Glucose-Capillary: 98 mg/dL (ref 65–99)
Glucose-Capillary: 129 mg/dL — ABNORMAL HIGH (ref 65–99)

## 2017-03-02 NOTE — Progress Notes (Signed)
         [facility]                                                                              Patient Demographics  April Franklin, is a 75 y.o. female, DOB - 12/13/1940, MRN:[id number]  Admit date - 02/17/2017   Admitting Physician David J Merrell, MD  Outpatient Primary MD for the patient is HUSAIN,KARRAR, MD  Outpatient specialists:   LOS - 13  days    Chief Complaint  Patient presents with  . 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 Chief complaint was leaking G-tube with intermittent fevers.  X ray shows ileus vs early obstruction.  Patient also had propagation of lower extremity DVT.   Overall prognosis very poor and family has vascillated with regards to her overall GOC. Currently pt is DNR.  Palliative care has met with family again and family wishes to pursue treatment of treatable conditions.  There are multiple family members involved in patient's care.     Assessment & Plan    ETHICS: -this is a 76/F with adenocA colon with liver mets, recent multiple hospitalization with extensive CVAs with PEG and Trach, bed bound, non verbal and poorly responsive at baseline admitted with N/Vomiting, Sepsis, Aspiration seen by Palliative care last few admissions, family resistant to Full Comfort care, they are still hopeful for recovery and a miracle despite being told regularly by medical professionals that she is dying. -discussed this everyday with daughters, and April Larach, NP-Palliative medicine again 4/5 pm with all 3 daughters and spouse to progress with comfort measures, after this meeting they agreed with stopping amio/heparin,  stopping blood draws, they want to keep tube feeds, CBGs etc, stop Abx today, completed 13days of Zosyn -discussed with daughter again today, that recommendation is for Home with Hospice   SIRS/Sepsis likely due to aspiration/sacral decub wounds - Low-grade fevers with tachypnea, tachycardia, leukocytosis, elevated lactic acid, acute kidney injury, encephalopathy.  - Patient was placed on IV fluid resuscitation and broad-spectrum antibiotics, currently on IV Zosyn.  - C. difficile negative, MRSA PCR screening negative, no UTI.   - Chest x-ray 4/2 showed bilateral hypo-inflation, atelectasis or early pneumonia in the right upper lobe new since the previous study, had profound leukocytosis, all labs stopped - On IV Zosyn Day 13 now-no significant improvement despite this, but afebrile now - Stopped Zosyn 4/7  Stage 4 adenocarcinoma with mets to liver -very poor prognosis -not candidate for any therapy  Acute kidney injury.  -due to sepsis and third spacing, clinically volume overloaded with 2plus edema but intravascularly dry  -Creatinine trending up with positive balance of 13 L, IV fluids discontinued and placed on IV Lasix, then held due to rise in craetinine lasix held -Hold nephrotoxins, off Vanc -creatinine trending up to 4.1 on 4/5-all blood draws stopped since then  Abdominal distention with ileus with history   of near total colectomy from adenocarcinoma colon/metastatic -  Abdominal x-ray 4/2 showed stable ileus - patient was started on trickle tube feeds, however overnight PEG tube came out and Foley was placed in the track. -s/p PEG tube replacement per Dr.Rai -tolerating tube feeds   Acute on chronic respiratory failure. Status post tracheostomy 2 hospitalizations ago. At that time, was also treated for aspiration pneumonia.  -Chest x-ray as noted above. ABG with pH of 7.51, PCO2 26.7, PO2 87 bicarb 21.7. -See #1, continue antitussives, scopolamine, O2 supplementation as  indicated, on IV Zosyn  Likely aspirating, continue IV Zosyn -also was on IV lasix-now held   Acute on chronic encephalopathy. Likely related to above in setting of recent stroke. Recent CT showed progressing of acute infarct on the left.  - Previously followed by neurology and discharged on 3/9 by Dr. Sethi, multiple medical issues, overall poor prognosis - Currently not following any commands, palliative care has been following - Aspirin stopped last hospitalization   Hypertension. Home meds include losartan and beta blocker. -now held   Diabetes. Serum glucose 155 on admission. Recent hemoglobin A1c is 7.2. Home medications include Lantus -Hold Lantus for now -Sliding scale insulin for optimal control  Lower extremity edema. right worse than left  - DVT 2 hospitalizations ago.  Duplex had shown worsening DVT, patient is not ambulatory and hypercoagulable from adenocarcinoma of colon with metastasis - Anticoagulation stopped last hospitalization due to anemia. Synovial fluid aspirated in ED, cultures negative   Anemia- - suspect decrease in Hgb from volume dilution. Unclear source. During previous hospitalization, Dr Chiu had discussed case with GI who did not recommend endoscopic evaluation given significant cardiovascular risk involved - follow closely, transfused 1 unit pRBC per Dr.Rai earlier this admission  Recent NSTEMI - no echo done since increased troponin - no intervention possible due to patient's poor prognosis/current state - seen by cards, nothing to add  Sacral Pressure Injury 02/17/17 appears unstageable per nusing -Wound care, air mattress, turn q2h  Paroxysmal Atrial Fib with RVR  - on 3/30, patient went into rapid atrial fibrillation with RVR, cardiology was consulted and recommended starting IV amiodarone and heparin -now both stopped  Code Status: dnr  DVT Prophylaxis:  Hep gtt stopped Family Communication: Discussed in detail with daughters almost  everyday, including today Disposition Plan:  Recommended Home with Hospice again, asked daughter to d/w her sisters abt this again Time Spent in minutes 45 minutes  Procedures:  Abdominal x-ray cxr   Consultants:   Palliative care Cardiology   Antimicrobials:   Zosyn 3/27 >>   Medications  Scheduled Meds: . chlorhexidine  15 mL Mouth Rinse BID  . feeding supplement (GLUCERNA 1.2 CAL)  1,000 mL Per Tube Q24H  . insulin aspart  0-9 Units Subcutaneous Q4H  . insulin glargine  10 Units Subcutaneous QHS  . mouth rinse  15 mL Mouth Rinse q12n4p  . pantoprazole (PROTONIX) IV  40 mg Intravenous Q24H  . sodium chloride flush  3 mL Intravenous Q12H   Continuous Infusions:  PRN Meds:.acetaminophen **OR** acetaminophen, albuterol, glycopyrrolate, morphine injection, ondansetron **OR** ondansetron (ZOFRAN) IV   Antibiotics   Anti-infectives    Start     Dose/Rate Route Frequency Ordered Stop   03/01/17 1430  piperacillin-tazobactam (ZOSYN) IVPB 2.25 g     2.25 g 100 mL/hr over 30 Minutes Intravenous Every 6 hours 03/01/17 1148 03/01/17 1407   02/21/17 0830  piperacillin-tazobactam (ZOSYN) IVPB 2.25 g  Status:  Discontinued       2.25 g 100 mL/hr over 30 Minutes Intravenous Every 6 hours 02/21/17 0805 03/01/17 1148   02/21/17 0815  piperacillin-tazobactam (ZOSYN) IVPB 2.25 g  Status:  Discontinued     2.25 g 100 mL/hr over 30 Minutes Intravenous Every 6 hours 02/21/17 0802 02/21/17 0805   02/18/17 1200  vancomycin (VANCOCIN) IVPB 1000 mg/200 mL premix     1,000 mg 200 mL/hr over 60 Minutes Intravenous Every 24 hours 02/17/17 1108 02/19/17 1210   02/17/17 1600  piperacillin-tazobactam (ZOSYN) IVPB 3.375 g  Status:  Discontinued     3.375 g 12.5 mL/hr over 240 Minutes Intravenous Every 8 hours 02/17/17 1108 02/21/17 0801   02/17/17 1330  piperacillin-tazobactam (ZOSYN) IVPB 3.375 g  Status:  Discontinued     3.375 g 100 mL/hr over 30 Minutes Intravenous  Once 02/17/17 1317  02/17/17 1322   02/17/17 1330  vancomycin (VANCOCIN) IVPB 1000 mg/200 mL premix  Status:  Discontinued     1,000 mg 200 mL/hr over 60 Minutes Intravenous  Once 02/17/17 1317 02/17/17 1322   02/17/17 1000  piperacillin-tazobactam (ZOSYN) IVPB 3.375 g     3.375 g 100 mL/hr over 30 Minutes Intravenous  Once 02/17/17 0945 02/17/17 1038   02/17/17 1000  vancomycin (VANCOCIN) IVPB 1000 mg/200 mL premix  Status:  Discontinued     1,000 mg 200 mL/hr over 60 Minutes Intravenous  Once 02/17/17 0945 02/17/17 0953   02/17/17 1000  vancomycin (VANCOCIN) 1,250 mg in sodium chloride 0.9 % 250 mL IVPB     1,250 mg 166.7 mL/hr over 90 Minutes Intravenous  Once 02/17/17 0953 02/17/17 1306        Subjective:   Unresponsive to any verbal commands.  No new events, no distress  Objective:   Vitals:   03/02/17 0700 03/02/17 0752 03/02/17 1142 03/02/17 1224  BP: (!) 143/66     Pulse: 70 74 75   Resp: _0 Temp: 97.1 F (36.2 C)   97 F (36.1 C)  TempSrc: Axillary   Oral  SpO2: 98% 97% 94%   Weight:      Height:        Intake/Output Summary (Last 24 hours) at 03/02/17 1318 Last data filed at 03/02/17 1226  Gross per 24 hour  Intake             1260 ml  Output                0 ml  Net             1260 ml     Wt Readings from Last 3 Encounters:  03/02/17 87.5 kg (193 lb)  02/04/17 65.2 kg (143 lb 11.2 oz)  01/31/17 63.2 kg (139 lb 5.3 oz)     Exam  General:Unresponsive, not responding to any verbal or painful stimuli  Neck: Supple, trach +  Cardiovascular: S1 S2/RRR  Respiratory: coarse breath sounds throughout, worse in RUL  Gastrointestinal : Soft, nontender  Ext: cyanotic changes noted in both feet, 1-2plus edema diffusely  Neuro: obtunded  Skin: No rashes, discoloration of toes  Psych:  Unable to assess  Data Reviewed:  I have personally reviewed following labs and imaging studies  Micro Results Recent Results (from the past 240 hour(s))  C difficile quick  scan w PCR reflex     Status: None   Collection Time: 02/23/17  1:47 PM  Result Value Ref Range Status   C Diff antigen NEGATIVE NEGATIVE Final   C  Diff toxin NEGATIVE NEGATIVE Final   C Diff interpretation No C. difficile detected.  Final    Radiology Reports Ct Head Wo Contrast  Result Date: 02/04/2017 CLINICAL DATA:  Sepsis.  Unresponsive for several days. EXAM: CT HEAD WITHOUT CONTRAST TECHNIQUE: Contiguous axial images were obtained from the base of the skull through the vertex without intravenous contrast. COMPARISON:  01/31/2017 FINDINGS: Brain: Diffuse cerebral atrophy. Mild ventricular dilatation consistent with central atrophy. Low-attenuation changes in the deep white matter with old lacunar infarcts consistent with small vessel ischemic change. Area of developing encephalomalacia in the right occipital lobe and right greater than left cerebellar hemispheres consistent with evolving infarcts. There is increased low-attenuation change in sulcal effacement in the left occipital lobe since previous study suggesting developing subacute infarct. The area of involvement appears more prominent than on the recent MRI. No midline shift. No abnormal extra-axial fluid collections. Gray-white matter junctions are distinct. Basal cisterns are not effaced. No acute intracranial hemorrhage. Vascular: Vascular calcifications are present. Skull: Calvarium appears intact. Sinuses/Orbits: Visualized paranasal sinuses and mastoid air cells are not opacified. Other: None. IMPRESSION: Developing low-attenuation and sulcal effacement in the left occipital lobe suggesting progressing acute infarct. Expected evolutionary changes of previous infarct in the right occipital region and bilateral cerebellum. No acute intracranial hemorrhage or significant mass effect. Electronically Signed   By: William  Stevens M.D.   On: 02/04/2017 06:42   Ir Fluoro Guide Cv Line Right  Result Date: 02/26/2017 INDICATION: SEPSIS,  ATRIAL FIBRILLATION EXAM: ULTRASOUND AND FLUOROSCOPIC GUIDED RIGHT IJ NON TUNNELED PICC LINE INSERTION MEDICATIONS: 1% LIDOCAINE LOCALLY CONTRAST:  None FLUOROSCOPY TIME:  Thirty seconds (1 mGy) COMPLICATIONS: None immediate. TECHNIQUE: The procedure, risks, benefits, and alternatives were explained to the patient's family and informed written consent was obtained. A timeout was performed prior to the initiation of the procedure. The right neck was prepped with chlorhexidine in a sterile fashion, and a sterile drape was applied covering the operative field. Maximum barrier sterile technique with sterile gowns and gloves were used for the procedure. A timeout was performed prior to the initiation of the procedure. Local anesthesia was provided with 1% lidocaine. Under direct ultrasound guidance, the right IJ vein was accessed with a micropuncture kit after the overlying soft tissues were anesthetized with 1% lidocaine. An ultrasound image was saved for documentation purposes. A guidewire was advanced to the level of the superior caval-atrial junction for measurement purposes and the PICC line was cut to length. A peel-away sheath was placed and a 9 cm, 5 French, dual lumen was inserted to level of the superior caval-atrial junction. A post procedure spot fluoroscopic was obtained. The catheter easily aspirated and flushed and was sutured in place. A dressing was placed. The patient tolerated the procedure well without immediate post procedural complication. FINDINGS: After catheter placement, the tip lies within the superior cavoatrial junction. The catheter aspirates and flushes normally and is ready for immediate use. IMPRESSION: Successful ultrasound and fluoroscopic guided placement of a right IJ vein approach, 9 cm, 5 French, dual lumen non tunneled PICC with tip at the superior caval-atrial junction. The PICC line is ready for immediate use. Electronically Signed   By: M.  Shick M.D.   On: 02/26/2017 13:08    Ir Replc Gastro/colonic Tube Percut W/fluoro  Result Date: 02/25/2017 INDICATION: Gastrostomy tube was dislodged and patient has a Foley catheter in place. EXAM: REPLACEMENT OF GASTROSTOMY TUBE WITH FLUOROSCOPY FLUOROSCOPY TIME:  12 seconds, 1 mGy COMPLICATIONS: None immediate. MEDICATIONS: None ANESTHESIA/SEDATION:   None CONTRAST:  10 mL Isovue-300 - administered into the gastric lumen. PROCEDURE: Procedure was performed by the IR technologist. Foley catheter was removed and a new 22 French balloon retention catheter was inserted. Balloon was inflated with 9 ml of saline. Contrast injection was performed. Catheter was flushed. Fluoroscopic images were taken and saved for this procedure. FINDINGS: New 22 French gastrostomy tube is positioned in the stomach. IMPRESSION: Replacement of gastrostomy tube. Electronically Signed   By: Adam  Henn M.D.   On: 02/25/2017 15:17   Ir Us Guide Vasc Access Right  Result Date: 02/26/2017 INDICATION: SEPSIS, ATRIAL FIBRILLATION EXAM: ULTRASOUND AND FLUOROSCOPIC GUIDED RIGHT IJ NON TUNNELED PICC LINE INSERTION MEDICATIONS: 1% LIDOCAINE LOCALLY CONTRAST:  None FLUOROSCOPY TIME:  Thirty seconds (1 mGy) COMPLICATIONS: None immediate. TECHNIQUE: The procedure, risks, benefits, and alternatives were explained to the patient's family and informed written consent was obtained. A timeout was performed prior to the initiation of the procedure. The right neck was prepped with chlorhexidine in a sterile fashion, and a sterile drape was applied covering the operative field. Maximum barrier sterile technique with sterile gowns and gloves were used for the procedure. A timeout was performed prior to the initiation of the procedure. Local anesthesia was provided with 1% lidocaine. Under direct ultrasound guidance, the right IJ vein was accessed with a micropuncture kit after the overlying soft tissues were anesthetized with 1% lidocaine. An ultrasound image was saved for documentation  purposes. A guidewire was advanced to the level of the superior caval-atrial junction for measurement purposes and the PICC line was cut to length. A peel-away sheath was placed and a 9 cm, 5 French, dual lumen was inserted to level of the superior caval-atrial junction. A post procedure spot fluoroscopic was obtained. The catheter easily aspirated and flushed and was sutured in place. A dressing was placed. The patient tolerated the procedure well without immediate post procedural complication. FINDINGS: After catheter placement, the tip lies within the superior cavoatrial junction. The catheter aspirates and flushes normally and is ready for immediate use. IMPRESSION: Successful ultrasound and fluoroscopic guided placement of a right IJ vein approach, 9 cm, 5 French, dual lumen non tunneled PICC with tip at the superior caval-atrial junction. The PICC line is ready for immediate use. Electronically Signed   By: M.  Shick M.D.   On: 02/26/2017 13:08   Dg Abdomen Peg Tube Location  Result Date: 02/04/2017 CLINICAL DATA:  Gastrostomy catheter placement EXAM: ABDOMEN - 1 VIEW COMPARISON:  Study obtained earlier in the day. FINDINGS: Contrast, 50 mL Isovue-300, was injected into a gastrostomy catheter. The gastrostomy catheter is positioned in the gastric antrum. Contrast flows into the stomach without contrast extravasation. Bowel gas pattern is normal. No free air. IMPRESSION: Gastrostomy catheter positioning gastric antrum. No contrast extravasation evident. Electronically Signed   By: William  Woodruff III M.D.   On: 02/04/2017 17:09   Dg Chest Port 1 View  Result Date: 02/24/2017 CLINICAL DATA:  Dyspnea, tracheostomy patient, recent CVA. History of colonic malignancy EXAM: PORTABLE CHEST 1 VIEW COMPARISON:  Portable chest x-ray dated February 19, 2017 FINDINGS: The patient is rotated on today's study. The lungs are hypoinflated. There is mildly increased density in the right paratracheal region as compared to  the previous study. Elsewhere there is subtle patchy density at the left lung base which is stable. The heart is top-normal in size. The pulmonary vascularity is normal. The tracheostomy appliance tip projects just inferior to the inferior margin of the clavicular heads. The   bony structures exhibit no acute abnormalities. IMPRESSION: Bilateral hypoinflation. There may be atelectasis or early pneumonia in the right upper lobe medially new since the previous study. Stable coarse lung markings at the left lung base. Follow-up radiographs are recommended. Electronically Signed   By: David  Jordan M.D.   On: 02/24/2017 14:41   Dg Chest Port 1 View  Result Date: 02/19/2017 CLINICAL DATA:  Sepsis, possible aspiration pneumonia. Patient admitted 02/17/2017. EXAM: PORTABLE CHEST 1 VIEW COMPARISON:  Single-view of the chest 02/18/2017 and 02/17/2017. FINDINGS: Right basilar atelectasis seen on yesterday's examination has resolved. Small focus of left basilar airspace disease is unchanged. There is no pneumothorax. Marked cardiomegaly without edema is seen. Tracheostomy tube is in place. IMPRESSION: No change in small focus of left basilar airspace disease. Resolved right basilar atelectasis. Cardiomegaly without edema. Electronically Signed   By: Thomas  Dalessio M.D.   On: 02/19/2017 07:56   Dg Chest Port 1 View  Result Date: 02/18/2017 CLINICAL DATA:  Ventilator dependent EXAM: PORTABLE CHEST 1 VIEW COMPARISON:  01/10/2017, 02/17/2017 FINDINGS: Tracheostomy tube in satisfactory position. Mild bilateral interstitial prominence likely secondary to low lung volumes. Mild right basilar atelectasis. No significant pleural effusion or pneumothorax. Stable cardiomegaly. No acute osseous abnormality. IMPRESSION: Cardiomegaly. No active cardiopulmonary disease. Electronically Signed   By: Hetal  Patel   On: 02/18/2017 16:21   Dg Chest Portable 1 View  Result Date: 02/17/2017 CLINICAL DATA:  Fever and tachypnea. EXAM:  PORTABLE CHEST 1 VIEW COMPARISON:  02/04/2017 FINDINGS: Tracheostomy tube tip is above the carina. The heart size is mildly enlarged. There is mild interstitial edema. No airspace opacities. IMPRESSION: 1. Cardiac enlargement and mild edema. Electronically Signed   By: Taylor  Stroud M.D.   On: 02/17/2017 10:53   Dg Chest Port 1 View  Result Date: 02/04/2017 CLINICAL DATA:  Sepsis, shortness of breath. History of colon cancer, recent for percutaneous gastrostomy tube placement. EXAM: PORTABLE CHEST - 1 VIEW; PORTABLE ABDOMEN - 1 VIEW COMPARISON:  CT abdomen and pelvis January 25, 2017 inch chest radiograph January 25, 2017 FINDINGS: Cardiac silhouette is upper limits of normal, mediastinal silhouette is nonsuspicious for this low inspiratory examination with crowded vasculature markings. Patient rotated to the RIGHT, unfurling the aorta. RIGHT mid lung zone bandlike density. Pleural effusions or focal consolidations. Trachea projects midline and there is no pneumothorax. Tracheostomy tube tip projects 3 cm above the carina. Included soft tissue planes and osseous structures are non-suspicious. Paucity of bowel gas on this single upright view. Single gas-filled nondistended loops below bowel in the LEFT lower quadrant. No intra-abdominal mass effect or pathologic calcifications. No intraperitoneal free air. Soft tissue planes and included osseous structures are nonsuspicious. IMPRESSION: RIGHT midlung zone atelectasis/scarring. Borderline cardiomegaly. Nonspecific bowel gas pattern. Electronically Signed   By: Courtnay  Bloomer M.D.   On: 02/04/2017 06:18   Dg Abd Portable 1v  Result Date: 02/24/2017 CLINICAL DATA:  Abdominal pain EXAM: PORTABLE ABDOMEN - 1 VIEW COMPARISON:  February 23, 2017 FINDINGS: There remains a loop of borderline dilated bowel in the mid abdomen. Elsewhere, there is somewhat of a paucity of gas. There is no air-fluid level. No free air. There are phleboliths in the pelvis. IMPRESSION: Essentially  stable appearing loop of mildly dilated bowel in the mid abdomen. Generalized paucity of gas, stable. Suspect ileus with possible superimposed enteritis. Bowel obstruction felt to be less likely. No free air. Electronically Signed   By: William  Woodruff III M.D.   On: 02/24/2017 07:29   Dg   Abd Portable 1v  Result Date: 02/23/2017 CLINICAL DATA:  Followup ileus. EXAM: PORTABLE ABDOMEN - 1 VIEW COMPARISON:  Yesterday. FINDINGS: The previously demonstrated dilated small bowel loop in the lower abdomen continues to have a maximum diameter of 4.3 cm. A paucity of intestinal gas is again demonstrated. Lumbar spine degenerative changes and bilateral pelvic phleboliths. IMPRESSION: Stable mild small bowel ileus. Electronically Signed   By: Claudie Revering M.D.   On: 02/23/2017 07:40   Dg Abd Portable 1v  Result Date: 02/22/2017 CLINICAL DATA:  Followup ileus.  Previous near total colectomy. EXAM: PORTABLE ABDOMEN - 1 VIEW COMPARISON:  02/20/2017. FINDINGS: Mildly dilated small bowel in the lower abdomen currently has a maximum diameter of 4.3 cm, previously 5.2 cm. Paucity of gas in the mid and upper abdomen. No gross free peritoneal air. Lumbar and lower thoracic spine degenerative changes. IMPRESSION: Improving small bowel ileus. Electronically Signed   By: Claudie Revering M.D.   On: 02/22/2017 08:02   Dg Abd Portable 1v  Result Date: 02/20/2017 CLINICAL DATA:  Abdominal distention. Ileus. History of near total colectomy. EXAM: PORTABLE ABDOMEN - 1 VIEW COMPARISON:  02/18/2017 abdominal radiograph. 12/15/2013 CT abdomen/ pelvis. 01/25/2017 unenhanced CT abdomen. FINDINGS: Percutaneous gastrostomy catheter overlies the body of the stomach. Persistent gaseous distension of small bowel loop in the lower abdomen up to 5.2 cm diameter, not appreciably changed. No evidence of pneumatosis or pneumoperitoneum. No radiopaque urolithiasis. IMPRESSION: Stable dilated small bowel loop in the lower abdomen compatible with the  provided history of adynamic ileus. No evidence of free air. Electronically Signed   By: Ilona Sorrel M.D.   On: 02/20/2017 10:30   Dg Abd Portable 1v  Result Date: 02/18/2017 CLINICAL DATA:  Hypoactive EXAM: PORTABLE ABDOMEN - 1 VIEW COMPARISON:  None. FINDINGS: Gaseous distended small bowel loops are noted mid lower abdomen suspicious for ileus or early bowel obstruction. IMPRESSION: Gaseous distended small bowel loops are noted mid lower abdomen suspicious for ileus or early bowel obstruction. Electronically Signed   By: Lahoma Crocker M.D.   On: 02/18/2017 20:22   Dg Abd Portable 1 View  Result Date: 02/04/2017 CLINICAL DATA:  Sepsis, shortness of breath. History of colon cancer, recent for percutaneous gastrostomy tube placement. EXAM: PORTABLE CHEST - 1 VIEW; PORTABLE ABDOMEN - 1 VIEW COMPARISON:  CT abdomen and pelvis January 25, 2017 inch chest radiograph January 25, 2017 FINDINGS: Cardiac silhouette is upper limits of normal, mediastinal silhouette is nonsuspicious for this low inspiratory examination with crowded vasculature markings. Patient rotated to the RIGHT, unfurling the aorta. RIGHT mid lung zone bandlike density. Pleural effusions or focal consolidations. Trachea projects midline and there is no pneumothorax. Tracheostomy tube tip projects 3 cm above the carina. Included soft tissue planes and osseous structures are non-suspicious. Paucity of bowel gas on this single upright view. Single gas-filled nondistended loops below bowel in the LEFT lower quadrant. No intra-abdominal mass effect or pathologic calcifications. No intraperitoneal free air. Soft tissue planes and included osseous structures are nonsuspicious. IMPRESSION: RIGHT midlung zone atelectasis/scarring. Borderline cardiomegaly. Nonspecific bowel gas pattern. Electronically Signed   By: Elon Alas M.D.   On: 02/04/2017 06:18    Lab Data:  CBC:  Recent Labs Lab 02/24/17 0301 02/25/17 0252 02/26/17 0251 02/27/17 0252    WBC 28.2* 28.3* 26.4* 29.8*  HGB 8.2* 8.7* 8.1* 8.1*  HCT 25.2* 26.8* 25.8* 25.8*  MCV 83.2 83.5 84.3 83.8  PLT 273 304 238 191   Basic Metabolic Panel:  Recent Labs  Lab 02/24/17 0301 02/26/17 0251 02/27/17 0252  NA 142 143 141  K 4.3 3.9 4.2  CL 111 113* 110  CO2 12* 12* 13*  GLUCOSE 244* 207* 265*  BUN 90* 102* 105*  CREATININE 2.94* 3.69* 4.12*  CALCIUM 7.9* 8.2* 8.1*   GFR: Estimated Creatinine Clearance: 12.3 mL/min (A) (by C-G formula based on SCr of 4.12 mg/dL (H)). Liver Function Tests: No results for input(s): AST, ALT, ALKPHOS, BILITOT, PROT, ALBUMIN in the last 168 hours. No results for input(s): LIPASE, AMYLASE in the last 168 hours. No results for input(s): AMMONIA in the last 168 hours. Coagulation Profile: No results for input(s): INR, PROTIME in the last 168 hours. Cardiac Enzymes: No results for input(s): CKTOTAL, CKMB, CKMBINDEX, TROPONINI in the last 168 hours. BNP (last 3 results) No results for input(s): PROBNP in the last 8760 hours. HbA1C: No results for input(s): HGBA1C in the last 72 hours. CBG:  Recent Labs Lab 03/01/17 2025 03/02/17 0023 03/02/17 0449 03/02/17 0745 03/02/17 1224  GLUCAP 165* 183* 154* 122* 84   Lipid Profile: No results for input(s): CHOL, HDL, LDLCALC, TRIG, CHOLHDL, LDLDIRECT in the last 72 hours. Thyroid Function Tests: No results for input(s): TSH, T4TOTAL, FREET4, T3FREE, THYROIDAB in the last 72 hours. Anemia Panel: No results for input(s): VITAMINB12, FOLATE, FERRITIN, TIBC, IRON, RETICCTPCT in the last 72 hours. Urine analysis:    Component Value Date/Time   COLORURINE AMBER (A) 02/17/2017 1315   APPEARANCEUR HAZY (A) 02/17/2017 1315   LABSPEC 1.028 02/17/2017 1315   PHURINE 5.0 02/17/2017 1315   GLUCOSEU 50 (A) 02/17/2017 1315   HGBUR SMALL (A) 02/17/2017 1315   BILIRUBINUR SMALL (A) 02/17/2017 1315   KETONESUR NEGATIVE 02/17/2017 1315   PROTEINUR 30 (A) 02/17/2017 1315   NITRITE NEGATIVE  02/17/2017 1315   LEUKOCYTESUR NEGATIVE 02/17/2017 [place] M.D. [facility] 03/02/2017, 1:18 PM  Pager: [phone] Between 7am to 7pm - call Pager - [phone]  After 7pm go to [email or web address] - password [email or web address]  Call night coverage person covering after 7pm

## 2017-03-03 LAB — GLUCOSE, CAPILLARY
GLUCOSE-CAPILLARY: 130 mg/dL — AB (ref 65–99)
Glucose-Capillary: 89 mg/dL (ref 65–99)
Glucose-Capillary: 80 mg/dL (ref 65–99)
Glucose-Capillary: 77 mg/dL (ref 65–99)
Glucose-Capillary: 100 mg/dL — ABNORMAL HIGH (ref 65–99)

## 2017-03-03 NOTE — Care Management Important Message (Signed)
Important Message  Patient Details  Name: April Franklin MRN: [id number] Date of Birth: Nov 21, 1941   Medicare Important Message Given:  Yes    Nathen May 03/03/2017, 4:45 PM

## 2017-03-03 NOTE — Progress Notes (Signed)
[facility]                                                                              Patient Demographics  April Franklin, is a 76 y.o. female, DOB - [birth year], WUJ:[id number]  Admit date - 02/17/2017   Admitting Physician Waldemar Dickens, MD  Outpatient Primary MD for the patient is [name], MD  Outpatient specialists:   LOS - 14  days    Chief Complaint  Patient presents with  . 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 Chief complaint was leaking G-tube with intermittent fevers.  X ray shows ileus vs early obstruction.  Patient also had propagation of lower extremity DVT.   Overall prognosis very poor and family has vascillated with regards to her overall GOC. Currently pt is DNR.  Palliative care has met with family again and family wishes to pursue treatment of treatable conditions.  There are multiple family members involved in patient's care.   S/p numerous family meetings with family/palliative care and myself, we have told them numerous times that pt is dying/terminal and out recommendation is for Comfort care/hospice only, finally Abx/heparin/amio stopped. Family still remains in denial and is fixated on wound care/trach care etc.   Assessment & Plan    ETHICS: -this is a 75/F with adenocA colon with liver mets, recent multiple hospitalization with extensive CVAs with PEG and Trach, bed bound, non verbal and poorly responsive at baseline admitted with N/Vomiting, Sepsis, Aspiration seen by Palliative care last few admissions, family resistant to Full Comfort care, they are still hopeful for recovery and a miracle  despite being told regularly by medical professionals that she is dying. -discussed this everyday with daughters, and April Lessen, NP-Palliative medicine again 4/5 pm with all 3 daughters and spouse to progress with comfort measures, after this meeting they agreed with stopping amio/heparin, stopping blood draws, they want to keep tube feeds, CBGs etc, stopped Abx 4/8, completed 13days of Zosyn -discussed with daughters numerous times that patient is dying and our recommendation remains Home with Hospice -family meeting again for 11am tomorrow  SIRS/Sepsis likely due to aspiration/sacral decub wounds - Low-grade fevers with tachypnea, tachycardia, leukocytosis, elevated lactic acid, acute kidney injury, encephalopathy.  - Patient was placed on IV fluid resuscitation and broad-spectrum antibiotics, currently on IV Zosyn.  - C. difficile negative, MRSA PCR screening negative, no UTI.   - Chest x-ray 4/2 showed bilateral hypo-inflation, atelectasis or early pneumonia in the right upper lobe new since the previous study, had profound leukocytosis, all labs stopped - On IV Zosyn Day 13 -Abx stopped, no significant change but afebrile now, labs stopped - Stopped Zosyn 4/7  Acute kidney injury.  -due to sepsis and third spacing, clinically volume overloaded with 1-2plus edema but intravascularly dry  -Creatinine trending up with positive balance of 13 L, IV fluids  discontinued and placed on IV Lasix, then held due to rise in craetinine lasix held -Hold nephrotoxins, off Vanc -creatinine trending up to 4.1 on 4/5-all blood draws stopped since then -now anuric, near end of life although family refuses to accept/see this  Stage 4 adenocarcinoma with mets to liver -very poor prognosis -not candidate for any therapy  Abdominal distention with ileus with history of near total colectomy from adenocarcinoma colon/metastatic -  Abdominal x-ray 4/2 showed stable ileus - patient was started on trickle tube  feeds, however overnight PEG tube came out and Foley was placed in the track. -s/p PEG tube replacement per Dr.Rai -tolerating tube feeds   Acute on chronic respiratory failure. Status post tracheostomy 2 hospitalizations ago. At that time, was also treated for aspiration pneumonia.  -Chest x-ray as noted above. ABG with pH of 7.51, PCO2 26.7, PO2 87 bicarb 21.7. -See #1, continue antitussives, scopolamine, O2 supplementation as indicated, on IV Zosyn  Likely aspirating, continue IV Zosyn -also was on IV lasix-now held   Acute on chronic encephalopathy. Likely related to above in setting of recent stroke. Recent CT showed progressing of acute infarct on the left.  - Previously followed by neurology and discharged on 3/9 by Dr. [name], multiple medical issues, overall poor prognosis - Currently not following any commands, palliative care has been following - Aspirin stopped last hospitalization   Hypertension. Home meds include losartan and beta blocker. -now held   Diabetes. Serum glucose 155 on admission. Recent hemoglobin A1c is 7.2. Home medications include Lantus -Hold Lantus for now -Sliding scale insulin for optimal control  Lower extremity edema. right worse than left  - DVT 2 hospitalizations ago.  Duplex had shown worsening DVT, patient is not ambulatory and hypercoagulable from adenocarcinoma of colon with metastasis - Anticoagulation stopped last hospitalization due to anemia. Synovial fluid aspirated in ED, cultures negative   Anemia- - suspect decrease in Hgb from volume dilution. Unclear source. During previous hospitalization, Dr [name] had discussed case with GI who did not recommend endoscopic evaluation given significant cardiovascular risk involved - follow closely, transfused 1 unit pRBC per Dr.Rai earlier this admission  Recent NSTEMI - no echo done since increased troponin - no intervention possible due to patient's poor prognosis/current state - seen by  cards, nothing to add  Sacral Pressure Injury 02/17/17 appears unstageable per nusing -Wound care, air mattress, turn q2h  Paroxysmal Atrial Fib with RVR  - on 3/30, patient went into rapid atrial fibrillation with RVR, cardiology was consulted and recommended starting IV amiodarone and heparin -now both stopped  Code Status: dnr  DVT Prophylaxis:  Hep gtt stopped Family Communication: Discussed in detail with daughters almost everyday, including today Disposition Plan:  Recommended Home with Hospice again 4/8, will meet again with family tomorrow am, expect hospital demise if she doesn't discharge soon  Time Spent in minutes 45 minutes  Procedures:  Abdominal x-ray cxr   Consultants:   Palliative care Cardiology   Antimicrobials:   Zosyn 3/27 >>   Medications  Scheduled Meds: . chlorhexidine  15 mL Mouth Rinse BID  . feeding supplement (GLUCERNA 1.2 CAL)  1,000 mL Per Tube Q24H  . insulin aspart  0-9 Units Subcutaneous Q4H  . insulin glargine  10 Units Subcutaneous QHS  . mouth rinse  15 mL Mouth Rinse q12n4p  . pantoprazole (PROTONIX) IV  40 mg Intravenous Q24H  . sodium chloride flush  3 mL Intravenous Q12H   Continuous Infusions:  PRN Meds:.acetaminophen **OR** acetaminophen,  albuterol, glycopyrrolate, morphine injection, ondansetron **OR** ondansetron (ZOFRAN) IV   Antibiotics   Anti-infectives    Start     Dose/Rate Route Frequency Ordered Stop   03/01/17 1430  piperacillin-tazobactam (ZOSYN) IVPB 2.25 g     2.25 g 100 mL/hr over 30 Minutes Intravenous Every 6 hours 03/01/17 1148 03/01/17 1407   02/21/17 0830  piperacillin-tazobactam (ZOSYN) IVPB 2.25 g  Status:  Discontinued     2.25 g 100 mL/hr over 30 Minutes Intravenous Every 6 hours 02/21/17 0805 03/01/17 1148   02/21/17 0815  piperacillin-tazobactam (ZOSYN) IVPB 2.25 g  Status:  Discontinued     2.25 g 100 mL/hr over 30 Minutes Intravenous Every 6 hours 02/21/17 0802 02/21/17 0805   02/18/17 1200   vancomycin (VANCOCIN) IVPB 1000 mg/200 mL premix     1,000 mg 200 mL/hr over 60 Minutes Intravenous Every 24 hours 02/17/17 1108 02/19/17 1210   02/17/17 1600  piperacillin-tazobactam (ZOSYN) IVPB 3.375 g  Status:  Discontinued     3.375 g 12.5 mL/hr over 240 Minutes Intravenous Every 8 hours 02/17/17 1108 02/21/17 0801   02/17/17 1330  piperacillin-tazobactam (ZOSYN) IVPB 3.375 g  Status:  Discontinued     3.375 g 100 mL/hr over 30 Minutes Intravenous  Once 02/17/17 1317 02/17/17 1322   02/17/17 1330  vancomycin (VANCOCIN) IVPB 1000 mg/200 mL premix  Status:  Discontinued     1,000 mg 200 mL/hr over 60 Minutes Intravenous  Once 02/17/17 1317 02/17/17 1322   02/17/17 1000  piperacillin-tazobactam (ZOSYN) IVPB 3.375 g     3.375 g 100 mL/hr over 30 Minutes Intravenous  Once 02/17/17 0945 02/17/17 1038   02/17/17 1000  vancomycin (VANCOCIN) IVPB 1000 mg/200 mL premix  Status:  Discontinued     1,000 mg 200 mL/hr over 60 Minutes Intravenous  Once 02/17/17 0945 02/17/17 0953   02/17/17 1000  vancomycin (VANCOCIN) 1,250 mg in sodium chloride 0.9 % 250 mL IVPB     1,250 mg 166.7 mL/hr over 90 Minutes Intravenous  Once 02/17/17 0953 02/17/17 1306        Subjective:   Unresponsive to any verbal commands.  No new events, no distress  Objective:   Vitals:   03/03/17 0800 03/03/17 0833 03/03/17 1119 03/03/17 1200  BP: (!) 118/39   (!) 103/39  Pulse: 65 73  70  Resp: (!) 29 (!) 21  17  Temp: (!) 96.6 F (35.9 C)  (!) 96.5 F (35.8 C)   TempSrc: Axillary  Axillary   SpO2: 97% 97%  98%  Weight:      Height:        Intake/Output Summary (Last 24 hours) at 03/03/17 1553 Last data filed at 03/03/17 1400  Gross per 24 hour  Intake              160 ml  Output               10 ml  Net              150 ml     Wt Readings from Last 3 Encounters:  03/03/17 83.5 kg (184 lb)  02/04/17 65.2 kg (143 lb 11.2 oz)  01/31/17 63.2 kg (139 lb 5.3 oz)     Exam  General:Unresponsive, not  responding to any verbal or painful stimuli  Neck: Supple, trach +, eyes open, does not respond  Cardiovascular: S1 S2/RRR  Respiratory: coarse breath sounds throughout, worse in RUL  Gastrointestinal : Soft, nontender  Ext: cyanotic  changes noted in both feet, 1-2plus edema diffusely  Neuro: obtunded  Skin: No rashes, discoloration of toes  Psych:  Unable to assess  Data Reviewed:  I have personally reviewed following labs and imaging studies  Micro Results Recent Results (from the past 240 hour(s))  C difficile quick scan w PCR reflex     Status: None   Collection Time: 02/23/17  1:47 PM  Result Value Ref Range Status   C Diff antigen NEGATIVE NEGATIVE Final   C Diff toxin NEGATIVE NEGATIVE Final   C Diff interpretation No C. difficile detected.  Final    Radiology Reports Ct Head Wo Contrast  Result Date: 02/04/2017 CLINICAL DATA:  Sepsis.  Unresponsive for several days. EXAM: CT HEAD WITHOUT CONTRAST TECHNIQUE: Contiguous axial images were obtained from the base of the skull through the vertex without intravenous contrast. COMPARISON:  01/31/2017 FINDINGS: Brain: Diffuse cerebral atrophy. Mild ventricular dilatation consistent with central atrophy. Low-attenuation changes in the deep white matter with old lacunar infarcts consistent with small vessel ischemic change. Area of developing encephalomalacia in the right occipital lobe and right greater than left cerebellar hemispheres consistent with evolving infarcts. There is increased low-attenuation change in sulcal effacement in the left occipital lobe since previous study suggesting developing subacute infarct. The area of involvement appears more prominent than on the recent MRI. No midline shift. No abnormal extra-axial fluid collections. Gray-white matter junctions are distinct. Basal cisterns are not effaced. No acute intracranial hemorrhage. Vascular: Vascular calcifications are present. Skull: Calvarium appears intact.  Sinuses/Orbits: Visualized paranasal sinuses and mastoid air cells are not opacified. Other: None. IMPRESSION: Developing low-attenuation and sulcal effacement in the left occipital lobe suggesting progressing acute infarct. Expected evolutionary changes of previous infarct in the right occipital region and bilateral cerebellum. No acute intracranial hemorrhage or significant mass effect. Electronically Signed   By: Lucienne Capers M.D.   On: 02/04/2017 06:42   Ir Fluoro Guide Cv Line Right  Result Date: 02/26/2017 INDICATION: SEPSIS, ATRIAL FIBRILLATION EXAM: ULTRASOUND AND FLUOROSCOPIC GUIDED RIGHT IJ NON TUNNELED PICC LINE INSERTION MEDICATIONS: 1% LIDOCAINE LOCALLY CONTRAST:  None FLUOROSCOPY TIME:  Thirty seconds (1 mGy) COMPLICATIONS: None immediate. TECHNIQUE: The procedure, risks, benefits, and alternatives were explained to the patient's family and informed written consent was obtained. A timeout was performed prior to the initiation of the procedure. The right neck was prepped with chlorhexidine in a sterile fashion, and a sterile drape was applied covering the operative field. Maximum barrier sterile technique with sterile gowns and gloves were used for the procedure. A timeout was performed prior to the initiation of the procedure. Local anesthesia was provided with 1% lidocaine. Under direct ultrasound guidance, the right IJ vein was accessed with a micropuncture kit after the overlying soft tissues were anesthetized with 1% lidocaine. An ultrasound image was saved for documentation purposes. A guidewire was advanced to the level of the superior caval-atrial junction for measurement purposes and the PICC line was cut to length. A peel-away sheath was placed and a 9 cm, 5 [place], dual lumen was inserted to level of the superior caval-atrial junction. A post procedure spot fluoroscopic was obtained. The catheter easily aspirated and flushed and was sutured in place. A dressing was placed. The patient  tolerated the procedure well without immediate post procedural complication. FINDINGS: After catheter placement, the tip lies within the superior cavoatrial junction. The catheter aspirates and flushes normally and is ready for immediate use. IMPRESSION: Successful ultrasound and fluoroscopic guided placement of a right  IJ vein approach, 9 cm, 5 [place], dual lumen non tunneled PICC with tip at the superior caval-atrial junction. The PICC line is ready for immediate use. Electronically Signed   By: Jerilynn Mages.  Shick M.D.   On: 02/26/2017 13:08   Ir Replc Gastro/colonic Tube Percut W/fluoro  Result Date: 02/25/2017 INDICATION: Gastrostomy tube was dislodged and patient has a Foley catheter in place. EXAM: REPLACEMENT OF GASTROSTOMY TUBE WITH FLUOROSCOPY FLUOROSCOPY TIME:  12 seconds, 1 mGy COMPLICATIONS: None immediate. MEDICATIONS: None ANESTHESIA/SEDATION: None CONTRAST:  10 mL Isovue-300 - administered into the gastric lumen. PROCEDURE: Procedure was performed by the IR technologist. Foley catheter was removed and a new 45 French balloon retention catheter was inserted. Balloon was inflated with 9 ml of saline. Contrast injection was performed. Catheter was flushed. Fluoroscopic images were taken and saved for this procedure. FINDINGS: New 85 French gastrostomy tube is positioned in the stomach. IMPRESSION: Replacement of gastrostomy tube. Electronically Signed   By: Markus Daft M.D.   On: 02/25/2017 15:17   Ir US Guide Vasc Access Right  Result Date: 02/26/2017 INDICATION: SEPSIS, ATRIAL FIBRILLATION EXAM: ULTRASOUND AND FLUOROSCOPIC GUIDED RIGHT IJ NON TUNNELED PICC LINE INSERTION MEDICATIONS: 1% LIDOCAINE LOCALLY CONTRAST:  None FLUOROSCOPY TIME:  Thirty seconds (1 mGy) COMPLICATIONS: None immediate. TECHNIQUE: The procedure, risks, benefits, and alternatives were explained to the patient's family and informed written consent was obtained. A timeout was performed prior to the initiation of the procedure. The right  neck was prepped with chlorhexidine in a sterile fashion, and a sterile drape was applied covering the operative field. Maximum barrier sterile technique with sterile gowns and gloves were used for the procedure. A timeout was performed prior to the initiation of the procedure. Local anesthesia was provided with 1% lidocaine. Under direct ultrasound guidance, the right IJ vein was accessed with a micropuncture kit after the overlying soft tissues were anesthetized with 1% lidocaine. An ultrasound image was saved for documentation purposes. A guidewire was advanced to the level of the superior caval-atrial junction for measurement purposes and the PICC line was cut to length. A peel-away sheath was placed and a 9 cm, 5 [place], dual lumen was inserted to level of the superior caval-atrial junction. A post procedure spot fluoroscopic was obtained. The catheter easily aspirated and flushed and was sutured in place. A dressing was placed. The patient tolerated the procedure well without immediate post procedural complication. FINDINGS: After catheter placement, the tip lies within the superior cavoatrial junction. The catheter aspirates and flushes normally and is ready for immediate use. IMPRESSION: Successful ultrasound and fluoroscopic guided placement of a right IJ vein approach, 9 cm, 5 [place], dual lumen non tunneled PICC with tip at the superior caval-atrial junction. The PICC line is ready for immediate use. Electronically Signed   By: Jerilynn Mages.  Shick M.D.   On: 02/26/2017 13:08   Dg Abdomen Peg Tube Location  Result Date: 02/04/2017 CLINICAL DATA:  Gastrostomy catheter placement EXAM: ABDOMEN - 1 VIEW COMPARISON:  Study obtained earlier in the day. FINDINGS: Contrast, 50 mL Isovue-300, was injected into a gastrostomy catheter. The gastrostomy catheter is positioned in the gastric antrum. Contrast flows into the stomach without contrast extravasation. Bowel gas pattern is normal. No free air. IMPRESSION:  Gastrostomy catheter positioning gastric antrum. No contrast extravasation evident. Electronically Signed   By: Lowella Grip III M.D.   On: 02/04/2017 17:09   Dg Chest Port 1 View  Result Date: 02/24/2017 CLINICAL DATA:  Dyspnea, tracheostomy patient, recent CVA. History  of colonic malignancy EXAM: PORTABLE CHEST 1 VIEW COMPARISON:  Portable chest x-ray dated February 19, 2017 FINDINGS: The patient is rotated on today's study. The lungs are hypoinflated. There is mildly increased density in the right paratracheal region as compared to the previous study. Elsewhere there is subtle patchy density at the left lung base which is stable. The heart is top-normal in size. The pulmonary vascularity is normal. The tracheostomy appliance tip projects just inferior to the inferior margin of the clavicular heads. The bony structures exhibit no acute abnormalities. IMPRESSION: Bilateral hypoinflation. There may be atelectasis or early pneumonia in the right upper lobe medially new since the previous study. Stable coarse lung markings at the left lung base. Follow-up radiographs are recommended. Electronically Signed   By: David  Martinique M.D.   On: 02/24/2017 14:41   Dg Chest Port 1 View  Result Date: 02/19/2017 CLINICAL DATA:  Sepsis, possible aspiration pneumonia. Patient admitted 02/17/2017. EXAM: PORTABLE CHEST 1 VIEW COMPARISON:  Single-view of the chest 02/18/2017 and 02/17/2017. FINDINGS: Right basilar atelectasis seen on yesterday's examination has resolved. Small focus of left basilar airspace disease is unchanged. There is no pneumothorax. Marked cardiomegaly without edema is seen. Tracheostomy tube is in place. IMPRESSION: No change in small focus of left basilar airspace disease. Resolved right basilar atelectasis. Cardiomegaly without edema. Electronically Signed   By: Inge Rise M.D.   On: 02/19/2017 07:56   Dg Chest Port 1 View  Result Date: 02/18/2017 CLINICAL DATA:  Ventilator dependent EXAM:  PORTABLE CHEST 1 VIEW COMPARISON:  01/10/2017, 02/17/2017 FINDINGS: Tracheostomy tube in satisfactory position. Mild bilateral interstitial prominence likely secondary to low lung volumes. Mild right basilar atelectasis. No significant pleural effusion or pneumothorax. Stable cardiomegaly. No acute osseous abnormality. IMPRESSION: Cardiomegaly. No active cardiopulmonary disease. Electronically Signed   By: Kathreen Devoid   On: 02/18/2017 16:21   Dg Chest Portable 1 View  Result Date: 02/17/2017 CLINICAL DATA:  Fever and tachypnea. EXAM: PORTABLE CHEST 1 VIEW COMPARISON:  02/04/2017 FINDINGS: Tracheostomy tube tip is above the carina. The heart size is mildly enlarged. There is mild interstitial edema. No airspace opacities. IMPRESSION: 1. Cardiac enlargement and mild edema. Electronically Signed   By: Kerby Moors M.D.   On: 02/17/2017 10:53   Dg Chest Port 1 View  Result Date: 02/04/2017 CLINICAL DATA:  Sepsis, shortness of breath. History of colon cancer, recent for percutaneous gastrostomy tube placement. EXAM: PORTABLE CHEST - 1 VIEW; PORTABLE ABDOMEN - 1 VIEW COMPARISON:  CT abdomen and pelvis January 25, 2017 inch chest radiograph January 25, 2017 FINDINGS: Cardiac silhouette is upper limits of normal, mediastinal silhouette is nonsuspicious for this low inspiratory examination with crowded vasculature markings. Patient rotated to the RIGHT, unfurling the aorta. RIGHT mid lung zone bandlike density. Pleural effusions or focal consolidations. Trachea projects midline and there is no pneumothorax. Tracheostomy tube tip projects 3 cm above the carina. Included soft tissue planes and osseous structures are non-suspicious. Paucity of bowel gas on this single upright view. Single gas-filled nondistended loops below bowel in the LEFT lower quadrant. No intra-abdominal mass effect or pathologic calcifications. No intraperitoneal free air. Soft tissue planes and included osseous structures are nonsuspicious.  IMPRESSION: RIGHT midlung zone atelectasis/scarring. Borderline cardiomegaly. Nonspecific bowel gas pattern. Electronically Signed   By: Elon Alas M.D.   On: 02/04/2017 06:18   Dg Abd Portable 1v  Result Date: 02/24/2017 CLINICAL DATA:  Abdominal pain EXAM: PORTABLE ABDOMEN - 1 VIEW COMPARISON:  February 23, 2017 FINDINGS: There  remains a loop of borderline dilated bowel in the mid abdomen. Elsewhere, there is somewhat of a paucity of gas. There is no air-fluid level. No free air. There are phleboliths in the pelvis. IMPRESSION: Essentially stable appearing loop of mildly dilated bowel in the mid abdomen. Generalized paucity of gas, stable. Suspect ileus with possible superimposed enteritis. Bowel obstruction felt to be less likely. No free air. Electronically Signed   By: Lowella Grip III M.D.   On: 02/24/2017 07:29   Dg Abd Portable 1v  Result Date: 02/23/2017 CLINICAL DATA:  Followup ileus. EXAM: PORTABLE ABDOMEN - 1 VIEW COMPARISON:  Yesterday. FINDINGS: The previously demonstrated dilated small bowel loop in the lower abdomen continues to have a maximum diameter of 4.3 cm. A paucity of intestinal gas is again demonstrated. Lumbar spine degenerative changes and bilateral pelvic phleboliths. IMPRESSION: Stable mild small bowel ileus. Electronically Signed   By: Claudie Revering M.D.   On: 02/23/2017 07:40   Dg Abd Portable 1v  Result Date: 02/22/2017 CLINICAL DATA:  Followup ileus.  Previous near total colectomy. EXAM: PORTABLE ABDOMEN - 1 VIEW COMPARISON:  02/20/2017. FINDINGS: Mildly dilated small bowel in the lower abdomen currently has a maximum diameter of 4.3 cm, previously 5.2 cm. Paucity of gas in the mid and upper abdomen. No gross free peritoneal air. Lumbar and lower thoracic spine degenerative changes. IMPRESSION: Improving small bowel ileus. Electronically Signed   By: Claudie Revering M.D.   On: 02/22/2017 08:02   Dg Abd Portable 1v  Result Date: 02/20/2017 CLINICAL DATA:  Abdominal  distention. Ileus. History of near total colectomy. EXAM: PORTABLE ABDOMEN - 1 VIEW COMPARISON:  02/18/2017 abdominal radiograph. 12/15/2013 CT abdomen/ pelvis. 01/25/2017 unenhanced CT abdomen. FINDINGS: Percutaneous gastrostomy catheter overlies the body of the stomach. Persistent gaseous distension of small bowel loop in the lower abdomen up to 5.2 cm diameter, not appreciably changed. No evidence of pneumatosis or pneumoperitoneum. No radiopaque urolithiasis. IMPRESSION: Stable dilated small bowel loop in the lower abdomen compatible with the provided history of adynamic ileus. No evidence of free air. Electronically Signed   By: Ilona Sorrel M.D.   On: 02/20/2017 10:30   Dg Abd Portable 1v  Result Date: 02/18/2017 CLINICAL DATA:  Hypoactive EXAM: PORTABLE ABDOMEN - 1 VIEW COMPARISON:  None. FINDINGS: Gaseous distended small bowel loops are noted mid lower abdomen suspicious for ileus or early bowel obstruction. IMPRESSION: Gaseous distended small bowel loops are noted mid lower abdomen suspicious for ileus or early bowel obstruction. Electronically Signed   By: Lahoma Crocker M.D.   On: 02/18/2017 20:22   Dg Abd Portable 1 View  Result Date: 02/04/2017 CLINICAL DATA:  Sepsis, shortness of breath. History of colon cancer, recent for percutaneous gastrostomy tube placement. EXAM: PORTABLE CHEST - 1 VIEW; PORTABLE ABDOMEN - 1 VIEW COMPARISON:  CT abdomen and pelvis January 25, 2017 inch chest radiograph January 25, 2017 FINDINGS: Cardiac silhouette is upper limits of normal, mediastinal silhouette is nonsuspicious for this low inspiratory examination with crowded vasculature markings. Patient rotated to the RIGHT, unfurling the aorta. RIGHT mid lung zone bandlike density. Pleural effusions or focal consolidations. Trachea projects midline and there is no pneumothorax. Tracheostomy tube tip projects 3 cm above the carina. Included soft tissue planes and osseous structures are non-suspicious. Paucity of bowel gas on  this single upright view. Single gas-filled nondistended loops below bowel in the LEFT lower quadrant. No intra-abdominal mass effect or pathologic calcifications. No intraperitoneal free air. Soft tissue planes and included osseous  structures are nonsuspicious. IMPRESSION: RIGHT midlung zone atelectasis/scarring. Borderline cardiomegaly. Nonspecific bowel gas pattern. Electronically Signed   By: Elon Alas M.D.   On: 02/04/2017 06:18    Lab Data:  CBC:  Recent Labs Lab 02/25/17 0252 02/26/17 0251 02/27/17 0252  WBC 28.3* 26.4* 29.8*  HGB 8.7* 8.1* 8.1*  HCT 26.8* 25.8* 25.8*  MCV 83.5 84.3 83.8  PLT 304 238 314   Basic Metabolic Panel:  Recent Labs Lab 02/26/17 0251 02/27/17 0252  NA 143 141  K 3.9 4.2  CL 113* 110  CO2 12* 13*  GLUCOSE 207* 265*  BUN 102* 105*  CREATININE 3.69* 4.12*  CALCIUM 8.2* 8.1*   GFR: Estimated Creatinine Clearance: 12 mL/min (A) (by C-G formula based on SCr of 4.12 mg/dL (H)). Liver Function Tests: No results for input(s): AST, ALT, ALKPHOS, BILITOT, PROT, ALBUMIN in the last 168 hours. No results for input(s): LIPASE, AMYLASE in the last 168 hours. No results for input(s): AMMONIA in the last 168 hours. Coagulation Profile: No results for input(s): INR, PROTIME in the last 168 hours. Cardiac Enzymes: No results for input(s): CKTOTAL, CKMB, CKMBINDEX, TROPONINI in the last 168 hours. BNP (last 3 results) No results for input(s): PROBNP in the last 8760 hours. HbA1C: No results for input(s): HGBA1C in the last 72 hours. CBG:  Recent Labs Lab 03/02/17 2053 03/02/17 2319 03/03/17 0342 03/03/17 0749 03/03/17 1123  GLUCAP 129* 137* 130* 89 80   Lipid Profile: No results for input(s): CHOL, HDL, LDLCALC, TRIG, CHOLHDL, LDLDIRECT in the last 72 hours. Thyroid Function Tests: No results for input(s): TSH, T4TOTAL, FREET4, T3FREE, THYROIDAB in the last 72 hours. Anemia Panel: No results for input(s): VITAMINB12, FOLATE,  FERRITIN, TIBC, IRON, RETICCTPCT in the last 72 hours. Urine analysis:    Component Value Date/Time   COLORURINE AMBER (A) 02/17/2017 1315   APPEARANCEUR HAZY (A) 02/17/2017 1315   LABSPEC 1.028 02/17/2017 1315   PHURINE 5.0 02/17/2017 1315   GLUCOSEU 50 (A) 02/17/2017 1315   HGBUR SMALL (A) 02/17/2017 1315   BILIRUBINUR SMALL (A) 02/17/2017 1315   KETONESUR NEGATIVE 02/17/2017 1315   PROTEINUR 30 (A) 02/17/2017 1315   NITRITE NEGATIVE 02/17/2017 1315   LEUKOCYTESUR NEGATIVE 02/17/2017 Collin M.D. [facility] 03/03/2017, 3:53 PM  Pager: [phone] Between 7am to 7pm - call Pager - [phone]  After 7pm go to [email or web address] - password [email or web address]  Call night coverage person covering after 7pm

## 2017-03-03 NOTE — Progress Notes (Signed)
Nutrition Follow up  DOCUMENTATION CODES:   Not applicable  INTERVENTION:    If plans to continue supportive care, recommend increase Glucerna 1.2 to goal rate of 60 ml/hr via PEG.   Provides 1728 kcal, 86 gm protein, 1159 ml free water daily.  NUTRITION DIAGNOSIS:   Inadequate oral intake related to inability to eat as evidenced by NPO status.  Ongoing   GOAL:   Patient will meet greater than or equal to 90% of their needs   Unmet  MONITOR:   TF tolerance, Skin, I & O's, Labs  ASSESSMENT:   76 y.o. 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 Chief complaint was leaking G-tube with intermittent fevers.  X ray shows ileus vs early obstruction.  Patient also had propagation of lower extremity DVT.    Currently receiving Glucerna 1.2 at 20 ml/h via PEG. Labs and medications reviewed. Noted poor prognosis. Family wishes to treat the treatable; continuing IV antibiotics. Palliative Care Team is following.  Weight up with positive fluid balance.  Diet Order:  Diet NPO time specified  Skin:  Wound (see comment) (stage II to buttocks)  Last BM:  4/9  Height:   Ht Readings from Last 1 Encounters:  02/17/17 5\' 2"  (1.575 m)    Weight:   Wt Readings from Last 1 Encounters:  03/03/17 184 lb (83.5 kg)   Admission weight 143 lbs  Ideal Body Weight:  50 kg  BMI:  26 using admission weight  Estimated Nutritional Needs:   Kcal:  1600-1800  Protein:  80-95 gm  Fluid:  1.6-1.8 L  EDUCATION NEEDS:   No education needs identified at this time  Molli Barrows, [place], [place], [place] Pager [phone] After Hours Pager [phone]

## 2017-03-03 NOTE — Progress Notes (Deleted)
Pt decannulated by Marni Griffon, NP.

## 2017-03-04 DIAGNOSIS — R627 Adult failure to thrive: Secondary | ICD-10-CM

## 2017-03-04 LAB — GLUCOSE, CAPILLARY
GLUCOSE-CAPILLARY: 129 mg/dL — AB (ref 65–99)
GLUCOSE-CAPILLARY: 136 mg/dL — AB (ref 65–99)
GLUCOSE-CAPILLARY: 130 mg/dL — AB (ref 65–99)
GLUCOSE-CAPILLARY: 106 mg/dL — AB (ref 65–99)
Glucose-Capillary: 141 mg/dL — ABNORMAL HIGH (ref 65–99)
Glucose-Capillary: 133 mg/dL — ABNORMAL HIGH (ref 65–99)
Glucose-Capillary: 108 mg/dL — ABNORMAL HIGH (ref 65–99)

## 2017-03-04 NOTE — Progress Notes (Signed)
[facility]                                                                              Patient Demographics  April Franklin, is a 76 y.o. female, DOB - Feb 16, 1941, ZOX:[id number]  Admit date - 02/17/2017   Admitting Physician April Dickens, MD  Outpatient Primary MD for the patient is April Low, MD  Outpatient specialists:   LOS - 15  days    Chief Complaint  Patient presents with  . 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 Chief complaint was leaking G-tube with intermittent fevers.  X ray shows ileus vs early obstruction.  Patient also had propagation of lower extremity DVT.   Overall prognosis very poor and family has vascillated with regards to her overall GOC. Currently pt is DNR.  Palliative care has met with family again and again -S/p numerous family meetings with family/palliative care and myself, we have told them numerous times that pt is dying/terminal and our recommendation remains for Comfort care/hospice only, finally Abx/heparin/amio/labs stopped. Family still remains in denial and is fixated on wound care/trach care etc and not agreeing to Hospice based discharge yet   Assessment & Plan    ETHICS: -this is a 75/F with adenocA colon with liver mets, recent multiple hospitalization with extensive CVAs with PEG and Trach, bed bound, non verbal and poorly responsive at baseline admitted with N/Vomiting, Sepsis, Aspiration seen by Palliative care last few admissions, family resistant to Full Comfort care, they are still hopeful for recovery and a miracle despite being told regularly by medical professionals that  she is dying. -discussed this everyday with daughters, and April Franklin again 4/5 pm with all 3 daughters and spouse to progress with comfort measures, after this meeting they agreed with stopping amio/heparin, stopping labs/blood draws, they want to keep tube feeds, CBGs etc,  - stopped Abx 4/8, completed 13days of Zosyn -discussed with daughters numerous times and held multiple family meetings again with April Franklin, last one being this morning at 57 with daughter and again recommended discharge with comfort/hospice based care only, I have told them everyday that the patient is dying, daughter agrees to some aspects but says that they will bring her back if anything happens and they have been told numerous times that she will have more complications before expiring. -our recommendation remains Home with Hospice or Residential Hospice yet again  SIRS/Sepsis likely due to aspiration/sacral decub wounds - Franklin-grade fevers with tachypnea, tachycardia, leukocytosis, elevated lactic acid, acute kidney injury, encephalopathy.  - Patient was placed on IV fluid resuscitation and broad-spectrum antibiotics, currently on IV Zosyn.  - C. difficile negative, MRSA PCR screening negative, no UTI.   - Chest x-ray 4/2 showed bilateral hypo-inflation, atelectasis or early pneumonia in the right upper lobe new since the previous study, had profound leukocytosis, all  labs stopped - On IV Zosyn Day 13 -Abx stopped, no significant change but afebrile now, labs stopped - Stopped Zosyn 4/7  Acute kidney injury.  -due to sepsis and third spacing, clinically volume overloaded with 1-2plus edema but intravascularly dry  -Creatinine trending up with positive balance of 13 L, IV fluids discontinued and placed on IV Lasix, then held due to rise in craetinine lasix held -Hold nephrotoxins, off Vanc -creatinine trending up to 4.1 on 4/5-all blood draws stopped since then -now anuric, near end of life  although family refuses to accept/see this  Stage 4 adenocarcinoma with mets to liver -very poor prognosis -not candidate for any therapy  Abdominal distention with ileus with history of near total colectomy from adenocarcinoma colon/metastatic -  Abdominal x-ray 4/2 showed stable ileus - patient was started on trickle tube feeds, however overnight PEG tube came out and Foley was placed in the track. -s/p PEG tube replacement per April Franklin -tolerating tube feeds   Acute on chronic respiratory failure. Status post tracheostomy 2 hospitalizations ago. At that time, was also treated for aspiration pneumonia.  -Chest x-ray as noted above. ABG with pH of 7.51, PCO2 26.7, PO2 87 bicarb 21.7. -See #1, continue antitussives, scopolamine, O2 supplementation as indicated, on IV Zosyn  Likely aspirating, continue IV Zosyn -also was on IV lasix-now held   Acute on chronic encephalopathy. Likely related to above in setting of recent stroke. Recent CT showed progressing of acute infarct on the left.  - Previously followed by neurology and discharged on 3/9 by April Franklin, multiple medical issues, overall poor prognosis - Currently not following any commands, palliative care has been following - Aspirin stopped last hospitalization   Hypertension. Home meds include losartan and beta blocker. -now held   Diabetes. Serum glucose 155 on Franklin. Recent hemoglobin A1c is 7.2. Home medications include Lantus -Hold Lantus for now -Sliding scale insulin for optimal control  Lower extremity edema. right worse than left  - DVT 2 hospitalizations ago.  Duplex had shown worsening DVT, patient is not ambulatory and hypercoagulable from adenocarcinoma of colon with metastasis - Anticoagulation stopped last hospitalization due to anemia. Synovial fluid aspirated in ED, cultures negative   Anemia- - suspect decrease in Hgb from volume dilution. Unclear source. During previous hospitalization, April Franklin had  discussed case with GI who did not recommend endoscopic evaluation given significant cardiovascular risk involved - follow closely, transfused 1 unit pRBC per April Franklin  Recent NSTEMI - no echo done since increased troponin - no intervention possible due to patient's poor prognosis/current state - seen by cards, nothing to add  Sacral Pressure Injury 02/17/17 appears unstageable per nusing -Wound care, air mattress, turn q2h  Paroxysmal Atrial Fib with RVR  - on 3/30, patient went into rapid atrial fibrillation with RVR, cardiology was consulted and recommended starting IV amiodarone and heparin -now both stopped  Code Status: dnr  DVT Prophylaxis:  Hep gtt stopped Family Communication: Discussed in detail with daughters everyday, including today Disposition Plan:  Recommended Home with Hospice again 4/7, 4/8, 4/9, and 4/10  Time Spent in minutes 45 minutes  Procedures:  Abdominal x-ray cxr   Consultants:   Palliative care Cardiology   Antimicrobials:   Zosyn 3/27 >>   Medications  Scheduled Meds: . chlorhexidine  15 mL Mouth Rinse BID  . feeding supplement (GLUCERNA 1.2 CAL)  1,000 mL Per Tube Q24H  . insulin aspart  0-9 Units Subcutaneous Q4H  . insulin glargine  10  Units Subcutaneous QHS  . mouth rinse  15 mL Mouth Rinse q12n4p  . pantoprazole (PROTONIX) IV  40 mg Intravenous Q24H  . sodium chloride flush  3 mL Intravenous Q12H   Continuous Infusions:  PRN Meds:.acetaminophen **OR** acetaminophen, albuterol, glycopyrrolate, morphine injection, ondansetron **OR** ondansetron (ZOFRAN) IV   Antibiotics   Anti-infectives    Start     Dose/Rate Route Frequency Ordered Stop   03/01/17 1430  piperacillin-tazobactam (ZOSYN) IVPB 2.25 g     2.25 g 100 mL/hr over 30 Minutes Intravenous Every 6 hours 03/01/17 1148 03/01/17 1407   02/21/17 0830  piperacillin-tazobactam (ZOSYN) IVPB 2.25 g  Status:  Discontinued     2.25 g 100 mL/hr over 30  Minutes Intravenous Every 6 hours 02/21/17 0805 03/01/17 1148   02/21/17 0815  piperacillin-tazobactam (ZOSYN) IVPB 2.25 g  Status:  Discontinued     2.25 g 100 mL/hr over 30 Minutes Intravenous Every 6 hours 02/21/17 0802 02/21/17 0805   02/18/17 1200  vancomycin (VANCOCIN) IVPB 1000 mg/200 mL premix     1,000 mg 200 mL/hr over 60 Minutes Intravenous Every 24 hours 02/17/17 1108 02/19/17 1210   02/17/17 1600  piperacillin-tazobactam (ZOSYN) IVPB 3.375 g  Status:  Discontinued     3.375 g 12.5 mL/hr over 240 Minutes Intravenous Every 8 hours 02/17/17 1108 02/21/17 0801   02/17/17 1330  piperacillin-tazobactam (ZOSYN) IVPB 3.375 g  Status:  Discontinued     3.375 g 100 mL/hr over 30 Minutes Intravenous  Once 02/17/17 1317 02/17/17 1322   02/17/17 1330  vancomycin (VANCOCIN) IVPB 1000 mg/200 mL premix  Status:  Discontinued     1,000 mg 200 mL/hr over 60 Minutes Intravenous  Once 02/17/17 1317 02/17/17 1322   02/17/17 1000  piperacillin-tazobactam (ZOSYN) IVPB 3.375 g     3.375 g 100 mL/hr over 30 Minutes Intravenous  Once 02/17/17 0945 02/17/17 1038   02/17/17 1000  vancomycin (VANCOCIN) IVPB 1000 mg/200 mL premix  Status:  Discontinued     1,000 mg 200 mL/hr over 60 Minutes Intravenous  Once 02/17/17 0945 02/17/17 0953   02/17/17 1000  vancomycin (VANCOCIN) 1,250 mg in sodium chloride 0.9 % 250 mL IVPB     1,250 mg 166.7 mL/hr over 90 Minutes Intravenous  Once 02/17/17 0953 02/17/17 1306        Subjective:   Unresponsive to any verbal commands.  No new events, no distress  Objective:   Vitals:   03/04/17 0452 03/04/17 0700 03/04/17 0723 03/04/17 1232  BP:   (!) 110/45 121/62  Pulse:  73 75 88  Resp:  19 20 (!) 24  Temp:  97.8 F (36.6 C)  97.1 F (36.2 C)  TempSrc:  Axillary  Axillary  SpO2:  100% 99% 96%  Weight: 83.9 kg (185 lb)     Height:        Intake/Output Summary (Last 24 hours) at 03/04/17 1512 Last data filed at 03/04/17 1237  Gross per 24 hour  Intake               400 ml  Output              475 ml  Net              -75 ml     Wt Readings from Last 3 Encounters:  03/04/17 83.9 kg (185 lb)  02/04/17 65.2 kg (143 lb 11.2 oz)  01/31/17 63.2 kg (139 lb 5.3 oz)     Exam  General:Unresponsive, not responding to any verbal or painful stimuli  Neck: Supple, trach +, eyes open, does not respond to any stimuli  Cardiovascular: S1 S2/RRR  Respiratory: coarse breath sounds throughout, worse in RUL  Gastrointestinal : Soft, nontender  Ext: cyanotic changes noted in both feet, 1-2plus edema diffusely  Neuro: obtunded  Skin: No rashes, discoloration of toes  Psych:  Unable to assess  Data Reviewed:  I have personally reviewed following labs and imaging studies  Micro Results Recent Results (from the past 240 hour(s))  C difficile quick scan w PCR reflex     Status: None   Collection Time: 02/23/17  1:47 PM  Result Value Ref Range Status   C Diff antigen NEGATIVE NEGATIVE Final   C Diff toxin NEGATIVE NEGATIVE Final   C Diff interpretation No C. difficile detected.  Final    Radiology Reports Ct Head Wo Contrast  Result Date: 02/04/2017 CLINICAL DATA:  Sepsis.  Unresponsive for several days. EXAM: CT HEAD WITHOUT CONTRAST TECHNIQUE: Contiguous axial images were obtained from the base of the skull through the vertex without intravenous contrast. COMPARISON:  01/31/2017 FINDINGS: Brain: Diffuse cerebral atrophy. Mild ventricular dilatation consistent with central atrophy. Franklin-attenuation changes in the deep white matter with old lacunar infarcts consistent with small vessel ischemic change. Area of developing encephalomalacia in the right occipital lobe and right greater than left cerebellar hemispheres consistent with evolving infarcts. There is increased Franklin-attenuation change in sulcal effacement in the left occipital lobe since previous study suggesting developing subacute infarct. The area of involvement appears more prominent  than on the recent MRI. No midline shift. No abnormal extra-axial fluid collections. Gray-white matter junctions are distinct. Basal cisterns are not effaced. No acute intracranial hemorrhage. Vascular: Vascular calcifications are present. Skull: Calvarium appears intact. Sinuses/Orbits: Visualized paranasal sinuses and mastoid air cells are not opacified. Other: None. IMPRESSION: Developing Franklin-attenuation and sulcal effacement in the left occipital lobe suggesting progressing acute infarct. Expected evolutionary changes of previous infarct in the right occipital region and bilateral cerebellum. No acute intracranial hemorrhage or significant mass effect. Electronically Signed   By: Lucienne Capers M.D.   On: 02/04/2017 06:42   Ir Fluoro Guide Cv Line Right  Result Date: 02/26/2017 INDICATION: SEPSIS, ATRIAL FIBRILLATION EXAM: ULTRASOUND AND FLUOROSCOPIC GUIDED RIGHT IJ NON TUNNELED PICC LINE INSERTION MEDICATIONS: 1% LIDOCAINE LOCALLY CONTRAST:  None FLUOROSCOPY TIME:  Thirty seconds (1 mGy) COMPLICATIONS: None immediate. TECHNIQUE: The procedure, risks, benefits, and alternatives were explained to the patient's family and informed written consent was obtained. A timeout was performed prior to the initiation of the procedure. The right neck was prepped with chlorhexidine in a sterile fashion, and a sterile drape was applied covering the operative field. Maximum barrier sterile technique with sterile gowns and gloves were used for the procedure. A timeout was performed prior to the initiation of the procedure. Local anesthesia was provided with 1% lidocaine. Under direct ultrasound guidance, the right IJ vein was accessed with a micropuncture kit after the overlying soft tissues were anesthetized with 1% lidocaine. An ultrasound image was saved for documentation purposes. A guidewire was advanced to the level of the superior caval-atrial junction for measurement purposes and the PICC line was cut to length. A  peel-away sheath was placed and a 9 cm, 5 [place], dual lumen was inserted to level of the superior caval-atrial junction. A post procedure spot fluoroscopic was obtained. The catheter easily aspirated and flushed and was sutured in place. A dressing was placed. The patient  tolerated the procedure well without immediate post procedural complication. FINDINGS: After catheter placement, the tip lies within the superior cavoatrial junction. The catheter aspirates and flushes normally and is ready for immediate use. IMPRESSION: Successful ultrasound and fluoroscopic guided placement of a right IJ vein approach, 9 cm, 5 [place], dual lumen non tunneled PICC with tip at the superior caval-atrial junction. The PICC line is ready for immediate use. Electronically Signed   By: Jerilynn Mages.  Shick M.D.   On: 02/26/2017 13:08   Ir Replc Gastro/colonic Tube Percut W/fluoro  Result Date: 02/25/2017 INDICATION: Gastrostomy tube was dislodged and patient has a Foley catheter in place. EXAM: REPLACEMENT OF GASTROSTOMY TUBE WITH FLUOROSCOPY FLUOROSCOPY TIME:  12 seconds, 1 mGy COMPLICATIONS: None immediate. MEDICATIONS: None ANESTHESIA/SEDATION: None CONTRAST:  10 mL Isovue-300 - administered into the gastric lumen. PROCEDURE: Procedure was performed by the IR technologist. Foley catheter was removed and a new 23 French balloon retention catheter was inserted. Balloon was inflated with 9 ml of saline. Contrast injection was performed. Catheter was flushed. Fluoroscopic images were taken and saved for this procedure. FINDINGS: New 78 French gastrostomy tube is positioned in the stomach. IMPRESSION: Replacement of gastrostomy tube. Electronically Signed   By: Markus Daft M.D.   On: 02/25/2017 15:17   Ir US Guide Vasc Access Right  Result Date: 02/26/2017 INDICATION: SEPSIS, ATRIAL FIBRILLATION EXAM: ULTRASOUND AND FLUOROSCOPIC GUIDED RIGHT IJ NON TUNNELED PICC LINE INSERTION MEDICATIONS: 1% LIDOCAINE LOCALLY CONTRAST:  None FLUOROSCOPY TIME:   Thirty seconds (1 mGy) COMPLICATIONS: None immediate. TECHNIQUE: The procedure, risks, benefits, and alternatives were explained to the patient's family and informed written consent was obtained. A timeout was performed prior to the initiation of the procedure. The right neck was prepped with chlorhexidine in a sterile fashion, and a sterile drape was applied covering the operative field. Maximum barrier sterile technique with sterile gowns and gloves were used for the procedure. A timeout was performed prior to the initiation of the procedure. Local anesthesia was provided with 1% lidocaine. Under direct ultrasound guidance, the right IJ vein was accessed with a micropuncture kit after the overlying soft tissues were anesthetized with 1% lidocaine. An ultrasound image was saved for documentation purposes. A guidewire was advanced to the level of the superior caval-atrial junction for measurement purposes and the PICC line was cut to length. A peel-away sheath was placed and a 9 cm, 5 [place], dual lumen was inserted to level of the superior caval-atrial junction. A post procedure spot fluoroscopic was obtained. The catheter easily aspirated and flushed and was sutured in place. A dressing was placed. The patient tolerated the procedure well without immediate post procedural complication. FINDINGS: After catheter placement, the tip lies within the superior cavoatrial junction. The catheter aspirates and flushes normally and is ready for immediate use. IMPRESSION: Successful ultrasound and fluoroscopic guided placement of a right IJ vein approach, 9 cm, 5 [place], dual lumen non tunneled PICC with tip at the superior caval-atrial junction. The PICC line is ready for immediate use. Electronically Signed   By: Jerilynn Mages.  Shick M.D.   On: 02/26/2017 13:08   Dg Abdomen Peg Tube Location  Result Date: 02/04/2017 CLINICAL DATA:  Gastrostomy catheter placement EXAM: ABDOMEN - 1 VIEW COMPARISON:  Study obtained earlier in the  day. FINDINGS: Contrast, 50 mL Isovue-300, was injected into a gastrostomy catheter. The gastrostomy catheter is positioned in the gastric antrum. Contrast flows into the stomach without contrast extravasation. Bowel gas pattern is normal. No free air. IMPRESSION: Gastrostomy  catheter positioning gastric antrum. No contrast extravasation evident. Electronically Signed   By: Lowella Grip III M.D.   On: 02/04/2017 17:09   Dg Chest Port 1 View  Result Date: 02/24/2017 CLINICAL DATA:  Dyspnea, tracheostomy patient, recent CVA. History of colonic malignancy EXAM: PORTABLE CHEST 1 VIEW COMPARISON:  Portable chest x-ray dated February 19, 2017 FINDINGS: The patient is rotated on today's study. The lungs are hypoinflated. There is mildly increased density in the right paratracheal region as compared to the previous study. Elsewhere there is subtle patchy density at the left lung base which is stable. The heart is top-normal in size. The pulmonary vascularity is normal. The tracheostomy appliance tip projects just inferior to the inferior margin of the clavicular heads. The bony structures exhibit no acute abnormalities. IMPRESSION: Bilateral hypoinflation. There may be atelectasis or early pneumonia in the right upper lobe medially new since the previous study. Stable coarse lung markings at the left lung base. Follow-up radiographs are recommended. Electronically Signed   By: David  Martinique M.D.   On: 02/24/2017 14:41   Dg Chest Port 1 View  Result Date: 02/19/2017 CLINICAL DATA:  Sepsis, possible aspiration pneumonia. Patient admitted 02/17/2017. EXAM: PORTABLE CHEST 1 VIEW COMPARISON:  Single-view of the chest 02/18/2017 and 02/17/2017. FINDINGS: Right basilar atelectasis seen on yesterday's examination has resolved. Small focus of left basilar airspace disease is unchanged. There is no pneumothorax. Marked cardiomegaly without edema is seen. Tracheostomy tube is in place. IMPRESSION: No change in small focus of  left basilar airspace disease. Resolved right basilar atelectasis. Cardiomegaly without edema. Electronically Signed   By: Inge Rise M.D.   On: 02/19/2017 07:56   Dg Chest Port 1 View  Result Date: 02/18/2017 CLINICAL DATA:  Ventilator dependent EXAM: PORTABLE CHEST 1 VIEW COMPARISON:  01/10/2017, 02/17/2017 FINDINGS: Tracheostomy tube in satisfactory position. Mild bilateral interstitial prominence likely secondary to Franklin lung volumes. Mild right basilar atelectasis. No significant pleural effusion or pneumothorax. Stable cardiomegaly. No acute osseous abnormality. IMPRESSION: Cardiomegaly. No active cardiopulmonary disease. Electronically Signed   By: Kathreen Devoid   On: 02/18/2017 16:21   Dg Chest Portable 1 View  Result Date: 02/17/2017 CLINICAL DATA:  Fever and tachypnea. EXAM: PORTABLE CHEST 1 VIEW COMPARISON:  02/04/2017 FINDINGS: Tracheostomy tube tip is above the carina. The heart size is mildly enlarged. There is mild interstitial edema. No airspace opacities. IMPRESSION: 1. Cardiac enlargement and mild edema. Electronically Signed   By: Kerby Moors M.D.   On: 02/17/2017 10:53   Dg Chest Port 1 View  Result Date: 02/04/2017 CLINICAL DATA:  Sepsis, shortness of breath. History of colon cancer, recent for percutaneous gastrostomy tube placement. EXAM: PORTABLE CHEST - 1 VIEW; PORTABLE ABDOMEN - 1 VIEW COMPARISON:  CT abdomen and pelvis January 25, 2017 inch chest radiograph January 25, 2017 FINDINGS: Cardiac silhouette is upper limits of normal, mediastinal silhouette is nonsuspicious for this Franklin inspiratory examination with crowded vasculature markings. Patient rotated to the RIGHT, unfurling the aorta. RIGHT mid lung zone bandlike density. Pleural effusions or focal consolidations. Trachea projects midline and there is no pneumothorax. Tracheostomy tube tip projects 3 cm above the carina. Included soft tissue planes and osseous structures are non-suspicious. Paucity of bowel gas on this  single upright view. Single gas-filled nondistended loops below bowel in the LEFT lower quadrant. No intra-abdominal mass effect or pathologic calcifications. No intraperitoneal free air. Soft tissue planes and included osseous structures are nonsuspicious. IMPRESSION: RIGHT midlung zone atelectasis/scarring. Borderline cardiomegaly. Nonspecific bowel gas  pattern. Electronically Signed   By: Elon Alas M.D.   On: 02/04/2017 06:18   Dg Abd Portable 1v  Result Date: 02/24/2017 CLINICAL DATA:  Abdominal pain EXAM: PORTABLE ABDOMEN - 1 VIEW COMPARISON:  February 23, 2017 FINDINGS: There remains a loop of borderline dilated bowel in the mid abdomen. Elsewhere, there is somewhat of a paucity of gas. There is no air-fluid level. No free air. There are phleboliths in the pelvis. IMPRESSION: Essentially stable appearing loop of mildly dilated bowel in the mid abdomen. Generalized paucity of gas, stable. Suspect ileus with possible superimposed enteritis. Bowel obstruction felt to be less likely. No free air. Electronically Signed   By: Lowella Grip III M.D.   On: 02/24/2017 07:29   Dg Abd Portable 1v  Result Date: 02/23/2017 CLINICAL DATA:  Followup ileus. EXAM: PORTABLE ABDOMEN - 1 VIEW COMPARISON:  Yesterday. FINDINGS: The previously demonstrated dilated small bowel loop in the lower abdomen continues to have a maximum diameter of 4.3 cm. A paucity of intestinal gas is again demonstrated. Lumbar spine degenerative changes and bilateral pelvic phleboliths. IMPRESSION: Stable mild small bowel ileus. Electronically Signed   By: Claudie Revering M.D.   On: 02/23/2017 07:40   Dg Abd Portable 1v  Result Date: 02/22/2017 CLINICAL DATA:  Followup ileus.  Previous near total colectomy. EXAM: PORTABLE ABDOMEN - 1 VIEW COMPARISON:  02/20/2017. FINDINGS: Mildly dilated small bowel in the lower abdomen currently has a maximum diameter of 4.3 cm, previously 5.2 cm. Paucity of gas in the mid and upper abdomen. No gross free  peritoneal air. Lumbar and lower thoracic spine degenerative changes. IMPRESSION: Improving small bowel ileus. Electronically Signed   By: Claudie Revering M.D.   On: 02/22/2017 08:02   Dg Abd Portable 1v  Result Date: 02/20/2017 CLINICAL DATA:  Abdominal distention. Ileus. History of near total colectomy. EXAM: PORTABLE ABDOMEN - 1 VIEW COMPARISON:  02/18/2017 abdominal radiograph. 12/15/2013 CT abdomen/ pelvis. 01/25/2017 unenhanced CT abdomen. FINDINGS: Percutaneous gastrostomy catheter overlies the body of the stomach. Persistent gaseous distension of small bowel loop in the lower abdomen up to 5.2 cm diameter, not appreciably changed. No evidence of pneumatosis or pneumoperitoneum. No radiopaque urolithiasis. IMPRESSION: Stable dilated small bowel loop in the lower abdomen compatible with the provided history of adynamic ileus. No evidence of free air. Electronically Signed   By: Ilona Sorrel M.D.   On: 02/20/2017 10:30   Dg Abd Portable 1v  Result Date: 02/18/2017 CLINICAL DATA:  Hypoactive EXAM: PORTABLE ABDOMEN - 1 VIEW COMPARISON:  None. FINDINGS: Gaseous distended small bowel loops are noted mid lower abdomen suspicious for ileus or early bowel obstruction. IMPRESSION: Gaseous distended small bowel loops are noted mid lower abdomen suspicious for ileus or early bowel obstruction. Electronically Signed   By: Lahoma Crocker M.D.   On: 02/18/2017 20:22   Dg Abd Portable 1 View  Result Date: 02/04/2017 CLINICAL DATA:  Sepsis, shortness of breath. History of colon cancer, recent for percutaneous gastrostomy tube placement. EXAM: PORTABLE CHEST - 1 VIEW; PORTABLE ABDOMEN - 1 VIEW COMPARISON:  CT abdomen and pelvis January 25, 2017 inch chest radiograph January 25, 2017 FINDINGS: Cardiac silhouette is upper limits of normal, mediastinal silhouette is nonsuspicious for this [place] inspiratory examination with crowded vasculature markings. Patient rotated to the RIGHT, unfurling the aorta. RIGHT mid lung zone bandlike  density. Pleural effusions or focal consolidations. Trachea projects midline and there is no pneumothorax. Tracheostomy tube tip projects 3 cm above the carina. Included soft tissue  planes and osseous structures are non-suspicious. Paucity of bowel gas on this single upright view. Single gas-filled nondistended loops below bowel in the LEFT lower quadrant. No intra-abdominal mass effect or pathologic calcifications. No intraperitoneal free air. Soft tissue planes and included osseous structures are nonsuspicious. IMPRESSION: RIGHT midlung zone atelectasis/scarring. Borderline cardiomegaly. Nonspecific bowel gas pattern. Electronically Signed   By: Elon Alas M.D.   On: 02/04/2017 06:18    Lab Data:  CBC:  Recent Labs Lab 02/26/17 0251 02/27/17 0252  WBC 26.4* 29.8*  HGB 8.1* 8.1*  HCT 25.8* 25.8*  MCV 84.3 83.8  PLT 238 023   Basic Metabolic Panel:  Recent Labs Lab 02/26/17 0251 02/27/17 0252  NA 143 141  K 3.9 4.2  CL 113* 110  CO2 12* 13*  GLUCOSE 207* 265*  BUN 102* 105*  CREATININE 3.69* 4.12*  CALCIUM 8.2* 8.1*   GFR: Estimated Creatinine Clearance: 12 mL/min (A) (by C-G formula based on SCr of 4.12 mg/dL (H)). Liver Function Tests: No results for input(s): AST, ALT, ALKPHOS, BILITOT, PROT, ALBUMIN in the last 168 hours. No results for input(s): LIPASE, AMYLASE in the last 168 hours. No results for input(s): AMMONIA in the last 168 hours. Coagulation Profile: No results for input(s): INR, PROTIME in the last 168 hours. Cardiac Enzymes: No results for input(s): CKTOTAL, CKMB, CKMBINDEX, TROPONINI in the last 168 hours. BNP (last 3 results) No results for input(s): PROBNP in the last 8760 hours. HbA1C: No results for input(s): HGBA1C in the last 72 hours. CBG:  Recent Labs Lab 03/03/17 1932 03/04/17 0141 03/04/17 0258 03/04/17 0743 03/04/17 1230  GLUCAP 100* 141* 133* 129* 136*   Lipid Profile: No results for input(s): CHOL, HDL, LDLCALC, TRIG,  CHOLHDL, LDLDIRECT in the last 72 hours. Thyroid Function Tests: No results for input(s): TSH, T4TOTAL, FREET4, T3FREE, THYROIDAB in the last 72 hours. Anemia Panel: No results for input(s): VITAMINB12, FOLATE, FERRITIN, TIBC, IRON, RETICCTPCT in the last 72 hours. Urine analysis:    Component Value Date/Time   COLORURINE AMBER (A) 02/17/2017 1315   APPEARANCEUR HAZY (A) 02/17/2017 1315   LABSPEC 1.028 02/17/2017 1315   PHURINE 5.0 02/17/2017 1315   GLUCOSEU 50 (A) 02/17/2017 1315   HGBUR SMALL (A) 02/17/2017 1315   BILIRUBINUR SMALL (A) 02/17/2017 1315   KETONESUR NEGATIVE 02/17/2017 1315   PROTEINUR 30 (A) 02/17/2017 1315   NITRITE NEGATIVE 02/17/2017 1315   LEUKOCYTESUR NEGATIVE 02/17/2017 [place] M.D. [facility] 03/04/2017, 3:12 PM  Pager: [phone] Between 7am to 7pm - call Pager - [phone]  After 7pm go to [email or web address] - password [email or web address]  Call night coverage person covering after 7pm

## 2017-03-04 NOTE — Consult Note (Signed)
[place] Nurse wound consult note Reason for Consult: sacrum, weekly re-assessment of the sacral pressure injury Family still request "cream to make better" Oldest daughter presented image on her phone at the time of admission of DTI (deep tissue injury), consistent with measurements and assessment from [facility] on 02/20/17.   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 Patient with Flexiseal for containment of bowel incontinence.  Patient with indwelling FC for urinary incontinence.  Low air loss mattress to assist with pressure redistribution and moisture management.   Long discussion with oldest daughter, she is concerned that we have not started to "treat" this pressure injury.  I again explained that because the eschar (or dead tissue) is adherent that we have several options to debride the ulcer 1. Surgical  2. Enzymatic 3. Hydrotherapy with enzymatic.  However once we open the natural protection of the skin the injury will most likely be through the subcutaneous tissue, muscle, and may be to the bone.  Explained pathophysiology of deep tissue injuries and how at the time of admission the injury was expected to evolve to what we are seeing now.  I have suggested she research the term deep tissue injury to see images of progression.  I have explained that while we are trying to maximize her nutrition the best we can that her organs can not tolerate lots of extra fluids/ protein and such and that our dietician has provided guidance on the best nutrition via her PEG tube to assist with wound healing. I asked the daughter multiple times if she wanted me to ask surgery to debride and that based on her current status  they may not be able to do that.  I asked if she wanted me to start the enzyme that will make the tissue soft and soupy and may increase the odor and that it would take a long time to remove all the tissue.  I have tried to explain that her skin is failing just like her other organs are not working the way they should.    I have suggested that she have a low air loss bed at home. She is concerned about DC to home and "what am I supposed to do with the wound"  I think they should try to maintain the wound at its current status.  I have explained that she will need to have no urine or stool on the wound.  Daughter reports they will have [place] and I have suggested if she notes any changed in the status of the wound once she is at home that she would have the [facility] assess for next steps. HHRN can request different wound care orders from the patient's primary care at any time while at home.  I have explained s/s of infection of the wound for the family to monitor and if she were to have this wound get infected that she would need to return to the hospital if aggressive care was desired.     I have tried to explain that I am not recommended any change in her treatment due to the current status of the wound, not the overall status of the patient.  If I believed that debridement either surgical, conservative sharp, or enzymatic would benefit this patient I certainly  would be advocating for that.  At this time and with her current medical status, maintenance of this wound and keeping her wound free from infection should be the primary goal.  WOC Nurse team will follow along with you for weekly wound assessments.  Please notify me of any acute changes in the wounds or any new areas of concerns [place] MSN, [place], CNS [phone]

## 2017-03-04 NOTE — Progress Notes (Signed)
Patient ID: April [place], female   DOB: [birth year], 76 y.o.   MRN: [id number]  This NP visited patient at the bedside along with attending Dr Broadus John and the patient's family to include her  [name]  to again review current medical situation, diagnosis,  prognosis and GOCs and recommendation for a more comfort approach.  April Franklin was visibly upset; crying.  After she got her self togethr she shared her many concerns regarding the current situation as it relates to wound care, trach care, tuning schedule, approach to family.  Family is generally dissatisfied.  I alerted charge nurse to the situation.    Discussed the natural trajectory and expectations at EOL.   We discussed the importance of all family members being on the same page as far as the [place] for this person.  Dr Broadus John and I shared our concern that patient continues to decline and is showing signs of transition at EOL. (minimal  responsiveness and mottling, decreased UOP)    We both supported April Franklin's strong faith and hope and remained present with her  in the fact that "only God knows when the time will come"  We discussed possibility of discharge home where her family could have total control over the day to day care of this pateint.  I discussed hospice services as a support to the family if interested.  April Franklin shared that " not all family are on the same page."  She has concerns about taking her home.  Discussed with April Franklin the importance of continued conversation with family regarding overall plan of care and treatment options,  ensuring decisions are within the context of the patients values and GOCs and best interest.  Questions and concerns addressed            I will continue to offer support to this family   Discussed with nursing staff  Time in 1100          Time out    1145  Greater than 50% of the time was spent in counseling and coordination of care  [name] NP  Palliative Medicine Team Team Phone #  [phone] Pager [phone]

## 2017-03-05 DIAGNOSIS — C787 Secondary malignant neoplasm of liver and intrahepatic bile duct: Secondary | ICD-10-CM

## 2017-03-05 LAB — GLUCOSE, CAPILLARY
GLUCOSE-CAPILLARY: 107 mg/dL — AB (ref 65–99)
Glucose-Capillary: 94 mg/dL (ref 65–99)
Glucose-Capillary: 98 mg/dL (ref 65–99)

## 2017-03-05 MED ORDER — MORPHINE SULFATE (CONCENTRATE) 10 MG/0.5ML PO SOLN: 5.0000 mg | Freq: Four times a day (QID) | ORAL | 0 refills | Status: AC | PRN

## 2017-03-05 MED ORDER — STERILE WATER PO LIQD: 1.0000 | ORAL | 0 refills | Status: AC

## 2017-03-05 MED ORDER — LORAZEPAM 1 MG PO TABS: 1.0000 mg | ORAL_TABLET | Freq: Four times a day (QID) | ORAL | Status: DC | PRN

## 2017-03-05 MED ORDER — MORPHINE SULFATE (CONCENTRATE) 10 MG/0.5ML PO SOLN: 5.0000 mg | ORAL | Status: DC | PRN

## 2017-03-05 MED ORDER — INSULIN DEGLUDEC 100 UNIT/ML ~~LOC~~ SOPN: 10.0000 [IU] | PEN_INJECTOR | Freq: Every day | SUBCUTANEOUS | 0 refills | Status: AC

## 2017-03-05 MED ORDER — SCOPOLAMINE 1 MG/3DAYS TD PT72: 1.0000 | MEDICATED_PATCH | TRANSDERMAL | 12 refills | Status: AC

## 2017-03-05 MED ORDER — FREE WATER: 100.0000 mL | Freq: Three times a day (TID) | 0 refills | Status: AC

## 2017-03-05 MED ORDER — ONDANSETRON HCL 4 MG/5ML PO SOLN: 4.0000 mg | Freq: Three times a day (TID) | ORAL | 0 refills | Status: AC | PRN

## 2017-03-05 NOTE — Discharge Instructions (Signed)
Flush with 30 ml (20 ml if fluid restricted) sterile water every 4 hours, before and after medication administration, and when continuous feeding is interrupted. Start at 20 ml/h, increase by 10 ml every 8 hours to goal rate of 60 ml/h Elevate HOB 30 degrees or reverse Trendelenburg Notify PCP of abdominal pain, distention, vomiting, or suspected aspiration  PEG Tube Home Guide A percutaneous endoscopic gastrostomy (PEG) tube is used to deliver food and fluids directly into the stomach. The tube has a clamp, a cap, and two anchors (bolsters). One bolster keeps the tube from coming out of the stomach. The other bolster holds the tube against the abdomen. You will be taught how to use and adjust your PEG tube before you leave the hospital. You will also be taught how to care for the opening (stoma) in your abdomen. Make sure that you understand:  How to care for your PEG tube.  How to care for your stoma.  How to give yourself feedings and medicines.  When to call your health care provider for help. How do I care for my peg tube? Check your PEG tube every day. Make sure:  It is not too tight. The bolster should rest gently over the stoma.  It is in the right position. There is a mark on the tube that shows when it is in the right position. Adjust the tube if you need to. How do I care for my stoma? Clean your stoma every day. Follow these steps: 1. Wash your hands with soap and water. 2. Check your stoma for redness, leaking, or skin irritation. 3. Wash the stoma gently with warm, soapy water. 4. Rinse the stoma with warm water. 5. Pat the stoma area dry. You may place a gauze pad over the opening between the outer bolster and your stoma. How do I give myself nutritional formula? Your health care provider will give you instructions about:  How much nutrition and fluid you will need for each feeding.  How often to have a feeding.  Whether to take medicine in the tube by itself or  with a feeding. 1.  To give yourself a feeding, follow these steps: 1. Lay out all of the equipment that you will need. 2. Make sure that the nutritional formula is at room temperature. 3. Wash your hands with soap and water. 4. Position yourself so that you are upright. You will need to stay upright throughout the feeding and for at least 30 minutes after the feeding. 5. Make sure the syringe plunger is pushed in. Place the tip of the syringe in clear water, and slowly pull the plunger to bring (draw up) the water into the syringe. 6. Remove the clamp and the cap from the PEG tube. 7. Push the water out of the syringe to clean (flush) the tube. 8. If the tube is clear, draw up the formula into the syringe. Make sure to use the right amount for each feeding and add water if necessary. 9. Slowly push the formula from the syringe through the tube. 10. After the feeding, flush the tube with water. 11.  How do I give myself medicine? To give yourself medicine, follow these steps: 1. Lay out all of the equipment that you will need. 2. If your medicine is in tablet form, crush the tablet and dissolve it in water. 3. Wash your hands with soap and water. 4. Position yourself so that you are upright. You will need to stay upright while you give  yourself medicine and for at least 30 minutes afterward. 5. Make sure the syringe plunger is pushed in. Place the tip of the syringe in clear water, and slowly pull the plunger to bring (draw up) the water into the syringe. 6. Remove the clamp and the cap from the PEG tube. 7. Push the water out of the syringe to clean (flush) the tube. 8. If the tube is clear, draw up the medicine into the syringe. 9.  10. Flush the tube with water. 11. Put the clamp and the cap on the tube. You should not take sustained release (SR) medicines through your tube. If you are unsure if your medicine is a SR medicine, ask your health care provider or pharmacist. Contact a  health care provider if:  You have soreness, redness, or irritation around your stoma.  You have abdominal pain or bloating during or after your feedings.  You have nausea, constipation, or diarrhea that will not go away.  You have a fever.  You have problems with your PEG tube. Get help right away if:  Your tube is blocked.  Your tube falls out.  You have pain around your tube.  You are bleeding from your tube.  Your tube is leaking.  You choke or you have trouble breathing during or after a feeding. This information is not intended to replace advice given to you by your health care provider. Make sure you discuss any questions you have with your health care provider. Document Released: 03/28/2015 Document Revised: 04/15/2016 Document Reviewed: 11/16/2014 Elsevier Interactive Patient Education  2017 [place] of a Feeding Tube People who have trouble swallowing or cannot take food or medicine by mouth are sometimes given feeding tubes. A feeding tube can go into the nose and down to the stomach or through the skin in the abdomen and into the stomach or small bowel. Some of the names of these feeding tubes are gastrostomy tubes, PEG lines, nasogastric tubes, and gastrojejunostomy tubes. Supplies needed to care for the tube site:  Clean gloves.  Clean wash cloth, gauze pads, or soft paper towel.  Cotton swabs.  Skin barrier ointment or cream.  Soap and water.  Pre-cut foam pads or gauze (that go around the tube).  Tube tape. Tube site care 1. Have all supplies ready and available. 2. Wash hands well. 3. Put on clean gloves. 4. Remove the soiled foam pad or gauze, if present, that is found under the tube stabilizer. Change the foam pad or gauze daily or when soiled or moist. 5. Check the skin around the tube site for redness, rash, swelling, drainage, or extra tissue growth. If you notice any of these, call your caregiver. 6. Moisten gauze and cotton swabs  with water and soap. 7. Wipe the area closest to the tube (right near the stoma) with cotton swabs. Wipe the surrounding skin with moistened gauze. Rinse with water. 8. Dry the skin and stoma site with a dry gauze pad or soft paper towel. Do not use antibiotic ointments at the tube site. 9. If the skin is red, apply a skin barrier cream or ointment (such as petroleum jelly) in a circular motion, using a cotton swab. The cream or ointment will provide a moisture barrier for the skin and helps with wound healing. 10. Apply a new pre-cut foam pad or gauze around the tube. Secure it with tape around the edges. If no drainage is present, foam pads or gauze may be left off. 11. Use tape  or an anchoring device to fasten the feeding tube to the skin for comfort or as directed. Rotate where you tape the tube to avoid skin damage from the adhesive. 12. Position the person in a semi-upright position (30-45 degree angle). 13. Throw away used supplies. 14. Remove gloves. 15. Wash hands. Supplies needed to flush a feeding tube:  Clean gloves.  60 mL syringe (that connects to the feeding tube).  Towel.  Water. Flushing a feeding tube 1. Have all supplies ready and available. 2. Wash hands well. 3. Put on clean gloves. 4. Draw up 30 mL of water in the syringe. 5. Kink the feeding tube while disconnecting it from the feeding-bag tubing or while removing the plug at the end of the tube. Kinking closes the tube and prevents secretions in the tube from spilling out. 6. Insert the tip of the syringe into the end of the feeding tube. Release the kink. Slowly inject the water. 7. If unable to inject the water, the person with the feeding tube should lay on his or her left side. The tip of the tube may be against the stomach wall, blocking fluid flow. Changing positions may move the tip away from the stomach wall. After repositioning, try injecting the water again.  Do not use a syringe smaller than 60 mL to  flush the tubing.  Do not use excessive force to overcome resistance because this could cause the tube to rupture. 8. After injecting the water, remove the syringe. 9. Always flush before giving the first medicine, between medicines, and after the final medicine before starting a feeding. This prevents medicines from clogging the tube.  Do not mix medicines with formula or with other medicines before giving medicines.  Thoroughly flush medicines through the tube so they do not mix with formula. 10. Throw away used supplies. 11. Remove gloves. 12. Wash hands. This information is not intended to replace advice given to you by your health care provider. Make sure you discuss any questions you have with your health care provider. Document Released: 11/11/2005 Document Revised: 04/24/2016 Document Reviewed: 06/25/2012 Elsevier Interactive Patient Education  2017 [employer].

## 2017-03-05 NOTE — Progress Notes (Signed)
Patient ID: April [place], female   DOB: 11-27-1940, 76 y.o.   MRN: [id number]  This NP visited patient at the bedside along with her  Daughter/Sandra to offer emotional support and guidance regarding current medical situation, family decisions and transition of care options  The decision is to take the patient home and the family will be the main caregivers for [name].    They have not decided if they wish to shift to hospice services or stay with [facility].  They will make that decision once they are home.   I detailed the difference between the two benefits.  Discussed the natural trajectory and expectations at EOL. I shared our concern that patient continues to decline and is showing signs of transition at EOL. (minimal  responsiveness and mottling, decreased UOP)    Discussed with [name] the importance of continued conversation with family regarding overall plan of care and treatment options,  ensuring decisions are within the context of the patient/family values and GOCs and best interest of the paient  Questions and concerns addressed            I will continue to offer support to this family   Discussed with Dr Posey Pronto Time in 0815         Time out    0845    Total time 45 min  Greater than 50% of the time was spent in counseling and coordination of care  [name] NP  Palliative Medicine Team Team Phone # [phone] Pager [phone]

## 2017-03-05 NOTE — Discharge Summary (Signed)
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)   Metastatic cancer ([place])   Adult failure to thrive  Admitted From: home Disposition:  Home with home health, Family refused hospice and SNF  Recommendations for Outpatient Follow-up:  1. Follow-up with PCP in one week for further goals of discussion.   Follow-up Information    HUSAIN,KARRAR, MD. Schedule an appointment as soon as possible for a visit in 1 week(s).   Specialty:  Internal Medicine Contact information: [address] [place] [phone]        [facility] Follow up.   Why:  Resume HHRN, PT, OT, aide and Social worker Contact information: [place] [address] [place] Follow up.   Why:  low air loss mattress will be delivered to home Contact information: [place] [address] [phone]          Diet recommendation: Continue tube feeding, gradually increase to goal 60 mL/h  Activity: The patient is advised to gradually reintroduce usual activities.  Discharge Condition: stable  Code Status: DNR DNI  History of present illness: As per the H and P dictated on admission, "April Franklin  is a 76 y.o. 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 Initial evaluation concerning for sepsis and reveals acute kidney injury.  Information is obtained from the daughter who is at the bedside. She states patient was discharged to home approximately a week ago and did "well" until 3 days ago when she developed intermittent fever. Max temp noted at being 102.2. Associated symptoms include emesis. Denies any coffee ground emesis. In addition they reported leaking around the G-tube is smells like "cc". No reports of increased secretions. No reports of diarrhea. They did report not receiving Protonix until yesterday. The medication was ordered at discharge. Daughter states it was "shortly after that that the G-tube started leaking". "  Hospital Course:  Summary of her active problems in the hospital is as following. ETHICS: Goals of care discussion -this is a 75/F with adenocA colon with liver mets, recent multiple hospitalization with extensive CVAs with PEG and Trach, bed bound, non verbal and poorly responsive at baseline admitted with N/Vomiting, Sepsis, Aspiration seen by Palliative care last few admissions, family resistant to Full Comfort care, they are still hopeful for recovery and a miracle despite being told regularly by medical professionals that she is dying. -Earlier provider discussed this everyday  with daughters, and [name], NP-Palliative medicine again 4/5 pm with all 3 daughters and spouse to progress with comfort measures, after this meeting they agreed with stopping amio/heparin, stopping labs/blood draws, they  want to keep tube feeds, CBGs etc,  - stopped Abx 4/8, completed 13days of Zosyn -discussed with daughters numerous times and held multiple family meetings again with [name], NP, last one being this morning at 77 with daughter and again recommended discharge with comfort/hospice based care only, Dr. Broadus John have told them everyday that the patient is dying, daughter agrees to some aspects but says that they will bring her back if anything happens and they have been told numerous times that she will have more complications before expiring. -our recommendation remains Home with Hospice or Residential Hospice yet again - I personally set down with daughters, spent time discussing cause of tissue mottling, third spacing, her liver metastasis, ascites, acute kidney injury. I explained the prognosis for the patient and possible scenarios in future. Also explain all of hospice as well as home health in patient's care. Family wished to take the patient home, home health was arranged, case manager was consulted for arranging care connection with hospice agency. -I provided prescriptions for scopolamine patch, by mouth morphine.  SIRS/Sepsis likely due to aspiration/sacral decub wounds - Low-grade fevers with tachypnea, tachycardia, leukocytosis, elevated lactic acid, acute kidney injury, encephalopathy.  - Patient was placed on IV fluid resuscitation and broad-spectrum antibiotics, currently on IV Zosyn.  - C. difficile negative, MRSA PCR screening negative, no UTI.   - Chest x-ray 4/2 showed bilateral hypo-inflation, atelectasis or early pneumonia in the right upper lobe new since the previous study, had profound leukocytosis, all labs stopped - On IV Zosyn Day 13 -Abx stopped, no significant change but afebrile now, labs stopped - Stopped Zosyn 4/7  Acute kidney injury.  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 -s/p PEG tube replacement per Dr.Rai -tolerating tube feeds, would recommend family to increase to goal of 60 mL per hour. Free water change from 2 64 mL 100 mL.   Acute on chronic respiratory failure. Status post tracheostomy 2 hospitalizations ago. At that time, was also treated for aspiration pneumonia.  -Chest x-ray as noted above. ABG with pH of 7.51, PCO2 26.7, PO2 87 bicarb 21.7. -See #1, continue antitussives, scopolamine, O2 supplementation as indicated, on IV Zosyn  Likely aspirating, treated with IV Zosyn -also was on IV lasix-now held   Acute on chronic encephalopathy. Likely related to above in setting of recent stroke. Recent CT showed progressing of acute infarct on the left.  - Previously followed by neurology and discharged on 3/9 by Dr. [name], multiple medical issues, overall poor prognosis - Currently not following any commands, palliative care has been following - Aspirin stopped  last hospitalization   Hypertension. Home meds include losartan and beta blocker. -now held   Diabetes. Serum glucose 155 on admission. Recent hemoglobin A1c is 7.2. Home medications include Lantus -Hold Lantus for now -Sliding scale insulin for optimal control  Lower extremity edema. right worse than left  - DVT 2  hospitalizations ago.  Duplex had shown worsening DVT, patient is not ambulatory and hypercoagulable from adenocarcinoma of colon with metastasis - Anticoagulation stopped last hospitalization due to anemia. Synovial fluid aspirated in ED, cultures negative   Anemia- - suspect decrease in Hgb from volume dilution. Unclear source. During previous hospitalization, Dr [name] had discussed case with GI who did not recommend endoscopic evaluationgiven significant cardiovascular risk involved - follow up with PCP.  transfused 1 unit pRBC per Dr.Rai earlier this admission  Recent NSTEMI - no echo done since increased troponin - no intervention possible due to patient's poor prognosis/current state - seen by cards, nothing to add  Sacral Pressure Injury 02/17/17 appears unstageable per nusing -Wound care, air mattress, turn q2h  Paroxysmal Atrial Fib with RVR  - on 3/30, patient went into rapid atrial fibrillation with RVR, cardiology was consulted and recommended starting IV amiodarone and heparin -now both stopped  All other chronic medical condition were stable during the hospitalization.   On the day of the discharge the patient's vitals were stable, and no other acute medical condition were reported by patient. the patient was felt safe to be discharge at home with home health.  Procedures and Results:  none   Consultations:  Palliative care  Cardiology  DISCHARGE MEDICATION: Discharge Medication List as of 03/05/2017  2:08 PM    START taking these medications   Details  Morphine Sulfate (MORPHINE CONCENTRATE) 10 MG/0.5ML SOLN concentrated solution Take 0.25 mLs (5 mg total) by mouth every 6 (six) hours as needed for moderate pain or shortness of breath., Starting Wed 03/05/2017, Print    ondansetron (ZOFRAN) 4 MG/5ML solution Take 5 mLs (4 mg total) by mouth every 8 (eight) hours as needed for nausea or vomiting., Starting Wed 03/05/2017, Normal    scopolamine  (TRANSDERM-SCOP, 1.5 MG,) 1 MG/3DAYS Place 1 patch (1.5 mg total) onto the skin every 3 (three) days., Starting Wed 03/05/2017, Normal    Sterile Water LIQD Apply 1 Act topically as directed., Starting Wed 03/05/2017, Normal    !! Water For Irrigation, Sterile (FREE WATER) SOLN Place 100 mLs into feeding tube every 8 (eight) hours., Starting Wed 03/05/2017, Print     !! - Potential duplicate medications found. Please discuss with provider.    CONTINUE these medications which have CHANGED   Details  insulin degludec (TRESIBA) 100 UNIT/ML SOPN FlexTouch Pen Inject 0.1 mLs (10 Units total) into the skin daily at 10 pm. PCP to assist with prior authorization if needed, Starting Wed 03/05/2017, Print      CONTINUE these medications which have NOT CHANGED   Details  chlorhexidine (PERIDEX) 0.12 % solution 15 mLs by Mouth Rinse route 2 (two) times daily., Starting Tue 02/11/2017, Print    docusate (COLACE) 50 MG/5ML liquid Place 5 mLs (50 mg total) into feeding tube at bedtime., Starting Fri 01/31/2017, Normal    guaiFENesin (MUCINEX) 600 MG 12 hr tablet Take 2 tablets (1,200 mg total) by mouth 2 (two) times daily., Starting Tue 02/11/2017, No Print    insulin aspart (NOVOLOG) 100 UNIT/ML FlexPen Inject 8 Units into the skin every 4 (four) hours., Starting Tue 02/11/2017, No Print    Insulin Pen  Needle (PEN NEEDLES) 32G X 5 MM MISC 1 Device by Does not apply route See admin instructions. For use with insulin pens. Additional refills per PCP, Starting Tue 02/11/2017, No Print    magnesium hydroxide (MILK OF MAGNESIA) 400 MG/5ML suspension Place 30 mLs into feeding tube daily as needed for mild constipation., Starting Fri 01/31/2017, Normal    pantoprazole sodium (PROTONIX) 40 mg/20 mL PACK Place 20 mLs (40 mg total) into feeding tube daily., Starting Tue 02/11/2017, Print    !! Water For Irrigation, Sterile (FREE WATER) SOLN Place 240 mLs into feeding tube every 6 (six) hours., Starting Fri 01/31/2017, OTC       !! - Potential duplicate medications found. Please discuss with provider.    STOP taking these medications     losartan (COZAAR) 25 MG tablet      metoprolol tartrate (LOPRESSOR) 25 MG tablet        No Known Allergies Discharge Instructions    Amb Referral to Palliative Care    Complete by:  As directed    For home use only DME Tube feeding    Complete by:  As directed    recommend increase Glucerna 1.2 to goal rate of 60 ml/hr via PEG.   Increase activity slowly    Complete by:  As directed      Discharge Exam: Filed Weights   03/03/17 0500 03/04/17 0452 03/05/17 0500  Weight: 83.5 kg (184 lb) 83.9 kg (185 lb) 78.5 kg (173 lb)   Vitals:   03/05/17 0958 03/05/17 1348  BP:  138/69  Pulse: 79 76  Resp: (!) 24 (!) 22  Temp:  97.6 F (36.4 C)   General: Appear in marked distress, no Rash; Oral Mucosa moist. Cardiovascular: S1 and S2 Present, no Murmur, difficult to assess JVD Respiratory: Bilateral Air entry present and bilateral crackles, no wheezes Abdomen: Bowel Sound present, Soft and no tenderness Extremities: bilateral  Pedal edema, bilateral mottling  Neurology: Opens eyes on verbal call, not following commands  The results of significant diagnostics from this hospitalization (including imaging, microbiology, ancillary and laboratory) are listed below for reference.    Significant Diagnostic Studies: Ct Head Wo Contrast  Result Date: 02/04/2017 CLINICAL DATA:  Sepsis.  Unresponsive for several days. EXAM: CT HEAD WITHOUT CONTRAST TECHNIQUE: Contiguous axial images were obtained from the base of the skull through the vertex without intravenous contrast. COMPARISON:  01/31/2017 FINDINGS: Brain: Diffuse cerebral atrophy. Mild ventricular dilatation consistent with central atrophy. Low-attenuation changes in the deep white matter with old lacunar infarcts consistent with small vessel ischemic change. Area of developing encephalomalacia in the right occipital lobe and  right greater than left cerebellar hemispheres consistent with evolving infarcts. There is increased low-attenuation change in sulcal effacement in the left occipital lobe since previous study suggesting developing subacute infarct. The area of involvement appears more prominent than on the recent MRI. No midline shift. No abnormal extra-axial fluid collections. Gray-white matter junctions are distinct. Basal cisterns are not effaced. No acute intracranial hemorrhage. Vascular: Vascular calcifications are present. Skull: Calvarium appears intact. Sinuses/Orbits: Visualized paranasal sinuses and mastoid air cells are not opacified. Other: None. IMPRESSION: Developing low-attenuation and sulcal effacement in the left occipital lobe suggesting progressing acute infarct. Expected evolutionary changes of previous infarct in the right occipital region and bilateral cerebellum. No acute intracranial hemorrhage or significant mass effect. Electronically Signed   By: Lucienne Capers M.D.   On: 02/04/2017 06:42   Ir Fluoro Guide Cv Line Right  Result  Date: 02/26/2017 INDICATION: SEPSIS, ATRIAL FIBRILLATION EXAM: ULTRASOUND AND FLUOROSCOPIC GUIDED RIGHT IJ NON TUNNELED PICC LINE INSERTION MEDICATIONS: 1% LIDOCAINE LOCALLY CONTRAST:  None FLUOROSCOPY TIME:  Thirty seconds (1 mGy) COMPLICATIONS: None immediate. TECHNIQUE: The procedure, risks, benefits, and alternatives were explained to the patient's family and informed written consent was obtained. A timeout was performed prior to the initiation of the procedure. The right neck was prepped with chlorhexidine in a sterile fashion, and a sterile drape was applied covering the operative field. Maximum barrier sterile technique with sterile gowns and gloves were used for the procedure. A timeout was performed prior to the initiation of the procedure. Local anesthesia was provided with 1% lidocaine. Under direct ultrasound guidance, the right IJ vein was accessed with a  micropuncture kit after the overlying soft tissues were anesthetized with 1% lidocaine. An ultrasound image was saved for documentation purposes. A guidewire was advanced to the level of the superior caval-atrial junction for measurement purposes and the PICC line was cut to length. A peel-away sheath was placed and a 9 cm, 5 [place], dual lumen was inserted to level of the superior caval-atrial junction. A post procedure spot fluoroscopic was obtained. The catheter easily aspirated and flushed and was sutured in place. A dressing was placed. The patient tolerated the procedure well without immediate post procedural complication. FINDINGS: After catheter placement, the tip lies within the superior cavoatrial junction. The catheter aspirates and flushes normally and is ready for immediate use. IMPRESSION: Successful ultrasound and fluoroscopic guided placement of a right IJ vein approach, 9 cm, 5 [place], dual lumen non tunneled PICC with tip at the superior caval-atrial junction. The PICC line is ready for immediate use. Electronically Signed   By: Jerilynn Mages.  Shick M.D.   On: 02/26/2017 13:08   Ir Replc Gastro/colonic Tube Percut W/fluoro  Result Date: 02/25/2017 INDICATION: Gastrostomy tube was dislodged and patient has a Foley catheter in place. EXAM: REPLACEMENT OF GASTROSTOMY TUBE WITH FLUOROSCOPY FLUOROSCOPY TIME:  12 seconds, 1 mGy COMPLICATIONS: None immediate. MEDICATIONS: None ANESTHESIA/SEDATION: None CONTRAST:  10 mL Isovue-300 - administered into the gastric lumen. PROCEDURE: Procedure was performed by the IR technologist. Foley catheter was removed and a new 47 French balloon retention catheter was inserted. Balloon was inflated with 9 ml of saline. Contrast injection was performed. Catheter was flushed. Fluoroscopic images were taken and saved for this procedure. FINDINGS: New 30 French gastrostomy tube is positioned in the stomach. IMPRESSION: Replacement of gastrostomy tube. Electronically Signed   By:  Markus Daft M.D.   On: 02/25/2017 15:17   Ir US Guide Vasc Access Right  Result Date: 02/26/2017 INDICATION: SEPSIS, ATRIAL FIBRILLATION EXAM: ULTRASOUND AND FLUOROSCOPIC GUIDED RIGHT IJ NON TUNNELED PICC LINE INSERTION MEDICATIONS: 1% LIDOCAINE LOCALLY CONTRAST:  None FLUOROSCOPY TIME:  Thirty seconds (1 mGy) COMPLICATIONS: None immediate. TECHNIQUE: The procedure, risks, benefits, and alternatives were explained to the patient's family and informed written consent was obtained. A timeout was performed prior to the initiation of the procedure. The right neck was prepped with chlorhexidine in a sterile fashion, and a sterile drape was applied covering the operative field. Maximum barrier sterile technique with sterile gowns and gloves were used for the procedure. A timeout was performed prior to the initiation of the procedure. Local anesthesia was provided with 1% lidocaine. Under direct ultrasound guidance, the right IJ vein was accessed with a micropuncture kit after the overlying soft tissues were anesthetized with 1% lidocaine. An ultrasound image was saved for documentation purposes. A guidewire  was advanced to the level of the superior caval-atrial junction for measurement purposes and the PICC line was cut to length. A peel-away sheath was placed and a 9 cm, 5 [place], dual lumen was inserted to level of the superior caval-atrial junction. A post procedure spot fluoroscopic was obtained. The catheter easily aspirated and flushed and was sutured in place. A dressing was placed. The patient tolerated the procedure well without immediate post procedural complication. FINDINGS: After catheter placement, the tip lies within the superior cavoatrial junction. The catheter aspirates and flushes normally and is ready for immediate use. IMPRESSION: Successful ultrasound and fluoroscopic guided placement of a right IJ vein approach, 9 cm, 5 [place], dual lumen non tunneled PICC with tip at the superior caval-atrial  junction. The PICC line is ready for immediate use. Electronically Signed   By: Jerilynn Mages.  Shick M.D.   On: 02/26/2017 13:08   Dg Abdomen Peg Tube Location  Result Date: 02/04/2017 CLINICAL DATA:  Gastrostomy catheter placement EXAM: ABDOMEN - 1 VIEW COMPARISON:  Study obtained earlier in the day. FINDINGS: Contrast, 50 mL Isovue-300, was injected into a gastrostomy catheter. The gastrostomy catheter is positioned in the gastric antrum. Contrast flows into the stomach without contrast extravasation. Bowel gas pattern is normal. No free air. IMPRESSION: Gastrostomy catheter positioning gastric antrum. No contrast extravasation evident. Electronically Signed   By: Lowella Grip III M.D.   On: 02/04/2017 17:09   Dg Chest Port 1 View  Result Date: 02/24/2017 CLINICAL DATA:  Dyspnea, tracheostomy patient, recent CVA. History of colonic malignancy EXAM: PORTABLE CHEST 1 VIEW COMPARISON:  Portable chest x-ray dated February 19, 2017 FINDINGS: The patient is rotated on today's study. The lungs are hypoinflated. There is mildly increased density in the right paratracheal region as compared to the previous study. Elsewhere there is subtle patchy density at the left lung base which is stable. The heart is top-normal in size. The pulmonary vascularity is normal. The tracheostomy appliance tip projects just inferior to the inferior margin of the clavicular heads. The bony structures exhibit no acute abnormalities. IMPRESSION: Bilateral hypoinflation. There may be atelectasis or early pneumonia in the right upper lobe medially new since the previous study. Stable coarse lung markings at the left lung base. Follow-up radiographs are recommended. Electronically Signed   By: David  Martinique M.D.   On: 02/24/2017 14:41   Dg Chest Port 1 View  Result Date: 02/19/2017 CLINICAL DATA:  Sepsis, possible aspiration pneumonia. Patient admitted 02/17/2017. EXAM: PORTABLE CHEST 1 VIEW COMPARISON:  Single-view of the chest 02/18/2017 and  02/17/2017. FINDINGS: Right basilar atelectasis seen on yesterday's examination has resolved. Small focus of left basilar airspace disease is unchanged. There is no pneumothorax. Marked cardiomegaly without edema is seen. Tracheostomy tube is in place. IMPRESSION: No change in small focus of left basilar airspace disease. Resolved right basilar atelectasis. Cardiomegaly without edema. Electronically Signed   By: Inge Rise M.D.   On: 02/19/2017 07:56   Dg Chest Port 1 View  Result Date: 02/18/2017 CLINICAL DATA:  Ventilator dependent EXAM: PORTABLE CHEST 1 VIEW COMPARISON:  01/10/2017, 02/17/2017 FINDINGS: Tracheostomy tube in satisfactory position. Mild bilateral interstitial prominence likely secondary to low lung volumes. Mild right basilar atelectasis. No significant pleural effusion or pneumothorax. Stable cardiomegaly. No acute osseous abnormality. IMPRESSION: Cardiomegaly. No active cardiopulmonary disease. Electronically Signed   By: Kathreen Devoid   On: 02/18/2017 16:21   Dg Chest Portable 1 View  Result Date: 02/17/2017 CLINICAL DATA:  Fever and tachypnea. EXAM: PORTABLE  CHEST 1 VIEW COMPARISON:  02/04/2017 FINDINGS: Tracheostomy tube tip is above the carina. The heart size is mildly enlarged. There is mild interstitial edema. No airspace opacities. IMPRESSION: 1. Cardiac enlargement and mild edema. Electronically Signed   By: Kerby Moors M.D.   On: 02/17/2017 10:53   Dg Chest Port 1 View  Result Date: 02/04/2017 CLINICAL DATA:  Sepsis, shortness of breath. History of colon cancer, recent for percutaneous gastrostomy tube placement. EXAM: PORTABLE CHEST - 1 VIEW; PORTABLE ABDOMEN - 1 VIEW COMPARISON:  CT abdomen and pelvis January 25, 2017 inch chest radiograph January 25, 2017 FINDINGS: Cardiac silhouette is upper limits of normal, mediastinal silhouette is nonsuspicious for this low inspiratory examination with crowded vasculature markings. Patient rotated to the RIGHT, unfurling the aorta.  RIGHT mid lung zone bandlike density. Pleural effusions or focal consolidations. Trachea projects midline and there is no pneumothorax. Tracheostomy tube tip projects 3 cm above the carina. Included soft tissue planes and osseous structures are non-suspicious. Paucity of bowel gas on this single upright view. Single gas-filled nondistended loops below bowel in the LEFT lower quadrant. No intra-abdominal mass effect or pathologic calcifications. No intraperitoneal free air. Soft tissue planes and included osseous structures are nonsuspicious. IMPRESSION: RIGHT midlung zone atelectasis/scarring. Borderline cardiomegaly. Nonspecific bowel gas pattern. Electronically Signed   By: Elon Alas M.D.   On: 02/04/2017 06:18   Dg Abd Portable 1v  Result Date: 02/24/2017 CLINICAL DATA:  Abdominal pain EXAM: PORTABLE ABDOMEN - 1 VIEW COMPARISON:  February 23, 2017 FINDINGS: There remains a loop of borderline dilated bowel in the mid abdomen. Elsewhere, there is somewhat of a paucity of gas. There is no air-fluid level. No free air. There are phleboliths in the pelvis. IMPRESSION: Essentially stable appearing loop of mildly dilated bowel in the mid abdomen. Generalized paucity of gas, stable. Suspect ileus with possible superimposed enteritis. Bowel obstruction felt to be less likely. No free air. Electronically Signed   By: Lowella Grip III M.D.   On: 02/24/2017 07:29   Dg Abd Portable 1v  Result Date: 02/23/2017 CLINICAL DATA:  Followup ileus. EXAM: PORTABLE ABDOMEN - 1 VIEW COMPARISON:  Yesterday. FINDINGS: The previously demonstrated dilated small bowel loop in the lower abdomen continues to have a maximum diameter of 4.3 cm. A paucity of intestinal gas is again demonstrated. Lumbar spine degenerative changes and bilateral pelvic phleboliths. IMPRESSION: Stable mild small bowel ileus. Electronically Signed   By: Claudie Revering M.D.   On: 02/23/2017 07:40   Dg Abd Portable 1v  Result Date: 02/22/2017 CLINICAL  DATA:  Followup ileus.  Previous near total colectomy. EXAM: PORTABLE ABDOMEN - 1 VIEW COMPARISON:  02/20/2017. FINDINGS: Mildly dilated small bowel in the lower abdomen currently has a maximum diameter of 4.3 cm, previously 5.2 cm. Paucity of gas in the mid and upper abdomen. No gross free peritoneal air. Lumbar and lower thoracic spine degenerative changes. IMPRESSION: Improving small bowel ileus. Electronically Signed   By: Claudie Revering M.D.   On: 02/22/2017 08:02   Dg Abd Portable 1v  Result Date: 02/20/2017 CLINICAL DATA:  Abdominal distention. Ileus. History of near total colectomy. EXAM: PORTABLE ABDOMEN - 1 VIEW COMPARISON:  02/18/2017 abdominal radiograph. 12/15/2013 CT abdomen/ pelvis. 01/25/2017 unenhanced CT abdomen. FINDINGS: Percutaneous gastrostomy catheter overlies the body of the stomach. Persistent gaseous distension of small bowel loop in the lower abdomen up to 5.2 cm diameter, not appreciably changed. No evidence of pneumatosis or pneumoperitoneum. No radiopaque urolithiasis. IMPRESSION: Stable dilated small bowel  loop in the lower abdomen compatible with the provided history of adynamic ileus. No evidence of free air. Electronically Signed   By: Ilona Sorrel M.D.   On: 02/20/2017 10:30   Dg Abd Portable 1v  Result Date: 02/18/2017 CLINICAL DATA:  Hypoactive EXAM: PORTABLE ABDOMEN - 1 VIEW COMPARISON:  None. FINDINGS: Gaseous distended small bowel loops are noted mid lower abdomen suspicious for ileus or early bowel obstruction. IMPRESSION: Gaseous distended small bowel loops are noted mid lower abdomen suspicious for ileus or early bowel obstruction. Electronically Signed   By: Lahoma Crocker M.D.   On: 02/18/2017 20:22   Dg Abd Portable 1 View  Result Date: 02/04/2017 CLINICAL DATA:  Sepsis, shortness of breath. History of colon cancer, recent for percutaneous gastrostomy tube placement. EXAM: PORTABLE CHEST - 1 VIEW; PORTABLE ABDOMEN - 1 VIEW COMPARISON:  CT abdomen and pelvis January 25, 2017 inch chest radiograph January 25, 2017 FINDINGS: Cardiac silhouette is upper limits of normal, mediastinal silhouette is nonsuspicious for this low inspiratory examination with crowded vasculature markings. Patient rotated to the RIGHT, unfurling the aorta. RIGHT mid lung zone bandlike density. Pleural effusions or focal consolidations. Trachea projects midline and there is no pneumothorax. Tracheostomy tube tip projects 3 cm above the carina. Included soft tissue planes and osseous structures are non-suspicious. Paucity of bowel gas on this single upright view. Single gas-filled nondistended loops below bowel in the LEFT lower quadrant. No intra-abdominal mass effect or pathologic calcifications. No intraperitoneal free air. Soft tissue planes and included osseous structures are nonsuspicious. IMPRESSION: RIGHT midlung zone atelectasis/scarring. Borderline cardiomegaly. Nonspecific bowel gas pattern. Electronically Signed   By: Elon Alas M.D.   On: 02/04/2017 06:18    Microbiology: No results found for this or any previous visit (from the past 240 hour(s)).   Labs: CBC:  Recent Labs Lab 02/27/17 0252  WBC 29.8*  HGB 8.1*  HCT 25.8*  MCV 83.8  PLT 458   Basic Metabolic Panel:  Recent Labs Lab 02/27/17 0252  NA 141  K 4.2  CL 110  CO2 13*  GLUCOSE 265*  BUN 105*  CREATININE 4.12*  CALCIUM 8.1*   Liver Function Tests: No results for input(s): AST, ALT, ALKPHOS, BILITOT, PROT, ALBUMIN in the last 168 hours. No results for input(s): LIPASE, AMYLASE in the last 168 hours. No results for input(s): AMMONIA in the last 168 hours. Cardiac Enzymes: No results for input(s): CKTOTAL, CKMB, CKMBINDEX, TROPONINI in the last 168 hours. BNP (last 3 results) No results for input(s): BNP in the last 8760 hours. CBG:  Recent Labs Lab 03/04/17 1948 03/04/17 2319 03/05/17 0336 03/05/17 0814 03/05/17 1259  GLUCAP 130* 106* 94 98 107*   Time spent: 50  minutes  Signed:  Berle Mull  [facility] 03/05/2017  6:02 PM

## 2017-03-05 NOTE — Care Management Note (Signed)
Case Management Note  Patient Details  Name: April Franklin MRN: [id number] Date of Birth: [birth year]  Subjective/Objective:   From home with family, active with [facility]   Presents with N/V of possible fecal material.  Xray shows ileus vs early obstruction, Has Lower ext DVT, Fevers of unknown origin, SIRS, Colon CA, Tracheostomy, , AKI,  prognosis is poor, Palliative has met with family, they wish to pursue txt of what is treatable. NCM will cont to follow for dc needs.    4/4 Tomi Bamberger RN, BSN - conts with sepsis and third spacing, leukocytosis, aki ,afib, acute resp failure (has trach), acute encephalopathy, stage 4 cancer with mets to liver, IR replaced peg tube. conts on heparin drip and amiodarone drip.  Per MD note, patient is dying but   Family cont to want to pursue txt of what is treatable.  NCM will cont to follow for dc needs.    4/10 Tomi Bamberger RN, BSN - NCM and [name] the DON  spoke with [name] , patient's daughter over the phone, she states she will be here tomorrow .  They plan to take patient home with [facility], and we will need an order for a low air loss mattress for patient. They woiuld like to continue with [facility], whom they are still active with.   4/11 [employer], BSN - patient is for dc today home with [place] with [facility] notified.  NCM also spoke with [name] about Palliative care following patient at home and explain that she can still have this while [facility] is there as well.  She states they would like that, NCM made referral to Palliative with [facility]).   NCM confirmed address with family [place] [address].  [facility] called for transport.                        Action/Plan:   Expected Discharge Date:  03/05/17               Expected Discharge Plan:  [place]  In-House Referral:     Discharge planning Services  CM Consult  Post Acute Care Choice:  Resumption of Svcs/PTA Provider, Home Health Choice  offered to:  Adult Children  DME Arranged:  Specialty mattress DME Agency:  [place] Arranged:  RN, PT, OT, Nurse's Aide, Speech Therapy, Social Work [employer] Agency:  [place]  Status of Service:  Completed, signed off  If discussed at [employer], dates discussed:    Additional Comments:  Zenon Mayo, RN 03/05/2017, 2:30 PM

## 2017-03-06 DIAGNOSIS — C7889 Secondary malignant neoplasm of other digestive organs: Secondary | ICD-10-CM | POA: Diagnosis not present

## 2017-03-06 DIAGNOSIS — I69391 Dysphagia following cerebral infarction: Secondary | ICD-10-CM | POA: Diagnosis not present

## 2017-03-06 DIAGNOSIS — J9600 Acute respiratory failure, unspecified whether with hypoxia or hypercapnia: Secondary | ICD-10-CM | POA: Diagnosis not present

## 2017-03-06 DIAGNOSIS — R1311 Dysphagia, oral phase: Secondary | ICD-10-CM | POA: Diagnosis not present

## 2017-03-06 DIAGNOSIS — I69998 Other sequelae following unspecified cerebrovascular disease: Secondary | ICD-10-CM | POA: Diagnosis not present

## 2017-03-06 DIAGNOSIS — E1165 Type 2 diabetes mellitus with hyperglycemia: Secondary | ICD-10-CM | POA: Diagnosis not present

## 2017-03-07 DIAGNOSIS — I679 Cerebrovascular disease, unspecified: Secondary | ICD-10-CM | POA: Diagnosis not present

## 2017-03-07 DIAGNOSIS — J9600 Acute respiratory failure, unspecified whether with hypoxia or hypercapnia: Secondary | ICD-10-CM | POA: Diagnosis not present

## 2017-03-07 DIAGNOSIS — G9340 Encephalopathy, unspecified: Secondary | ICD-10-CM | POA: Diagnosis not present

## 2017-03-07 DIAGNOSIS — I69391 Dysphagia following cerebral infarction: Secondary | ICD-10-CM | POA: Diagnosis not present

## 2017-03-07 DIAGNOSIS — I69998 Other sequelae following unspecified cerebrovascular disease: Secondary | ICD-10-CM | POA: Diagnosis not present

## 2017-03-07 DIAGNOSIS — E1165 Type 2 diabetes mellitus with hyperglycemia: Secondary | ICD-10-CM | POA: Diagnosis not present

## 2017-03-07 DIAGNOSIS — N179 Acute kidney failure, unspecified: Secondary | ICD-10-CM | POA: Diagnosis not present

## 2017-03-07 DIAGNOSIS — C7889 Secondary malignant neoplasm of other digestive organs: Secondary | ICD-10-CM | POA: Diagnosis not present

## 2017-03-07 DIAGNOSIS — R1311 Dysphagia, oral phase: Secondary | ICD-10-CM | POA: Diagnosis not present

## 2017-03-08 DIAGNOSIS — L89159 Pressure ulcer of sacral region, unspecified stage: Secondary | ICD-10-CM | POA: Diagnosis not present

## 2017-03-08 DIAGNOSIS — G9340 Encephalopathy, unspecified: Secondary | ICD-10-CM | POA: Diagnosis not present

## 2017-03-08 DIAGNOSIS — N184 Chronic kidney disease, stage 4 (severe): Secondary | ICD-10-CM | POA: Diagnosis not present

## 2017-03-08 DIAGNOSIS — I25119 Atherosclerotic heart disease of native coronary artery with unspecified angina pectoris: Secondary | ICD-10-CM | POA: Diagnosis not present

## 2017-03-08 DIAGNOSIS — I10 Essential (primary) hypertension: Secondary | ICD-10-CM | POA: Diagnosis not present

## 2017-03-08 DIAGNOSIS — R1310 Dysphagia, unspecified: Secondary | ICD-10-CM | POA: Diagnosis not present

## 2017-03-08 DIAGNOSIS — J9610 Chronic respiratory failure, unspecified whether with hypoxia or hypercapnia: Secondary | ICD-10-CM | POA: Diagnosis not present

## 2017-03-08 DIAGNOSIS — I4891 Unspecified atrial fibrillation: Secondary | ICD-10-CM | POA: Diagnosis not present

## 2017-03-08 DIAGNOSIS — E1159 Type 2 diabetes mellitus with other circulatory complications: Secondary | ICD-10-CM | POA: Diagnosis not present

## 2017-03-08 DIAGNOSIS — C189 Malignant neoplasm of colon, unspecified: Secondary | ICD-10-CM | POA: Diagnosis not present

## 2017-03-08 DIAGNOSIS — N179 Acute kidney failure, unspecified: Secondary | ICD-10-CM | POA: Diagnosis not present

## 2017-03-08 DIAGNOSIS — I679 Cerebrovascular disease, unspecified: Secondary | ICD-10-CM | POA: Diagnosis not present

## 2017-03-08 DIAGNOSIS — I82409 Acute embolism and thrombosis of unspecified deep veins of unspecified lower extremity: Secondary | ICD-10-CM | POA: Diagnosis not present

## 2017-03-08 DIAGNOSIS — C787 Secondary malignant neoplasm of liver and intrahepatic bile duct: Secondary | ICD-10-CM | POA: Diagnosis not present

## 2017-03-08 DIAGNOSIS — R0902 Hypoxemia: Secondary | ICD-10-CM | POA: Diagnosis not present

## 2017-03-09 DIAGNOSIS — G9340 Encephalopathy, unspecified: Secondary | ICD-10-CM | POA: Diagnosis not present

## 2017-03-09 DIAGNOSIS — I4891 Unspecified atrial fibrillation: Secondary | ICD-10-CM | POA: Diagnosis not present

## 2017-03-09 DIAGNOSIS — I679 Cerebrovascular disease, unspecified: Secondary | ICD-10-CM | POA: Diagnosis not present

## 2017-03-09 DIAGNOSIS — N184 Chronic kidney disease, stage 4 (severe): Secondary | ICD-10-CM | POA: Diagnosis not present

## 2017-03-09 DIAGNOSIS — J9610 Chronic respiratory failure, unspecified whether with hypoxia or hypercapnia: Secondary | ICD-10-CM | POA: Diagnosis not present

## 2017-03-09 DIAGNOSIS — N179 Acute kidney failure, unspecified: Secondary | ICD-10-CM | POA: Diagnosis not present

## 2017-03-10 DIAGNOSIS — I4891 Unspecified atrial fibrillation: Secondary | ICD-10-CM | POA: Diagnosis not present

## 2017-03-10 DIAGNOSIS — R627 Adult failure to thrive: Secondary | ICD-10-CM | POA: Diagnosis not present

## 2017-03-10 DIAGNOSIS — L89159 Pressure ulcer of sacral region, unspecified stage: Secondary | ICD-10-CM | POA: Diagnosis not present

## 2017-03-10 DIAGNOSIS — E1165 Type 2 diabetes mellitus with hyperglycemia: Secondary | ICD-10-CM | POA: Diagnosis not present

## 2017-03-10 DIAGNOSIS — J9610 Chronic respiratory failure, unspecified whether with hypoxia or hypercapnia: Secondary | ICD-10-CM | POA: Diagnosis not present

## 2017-03-10 DIAGNOSIS — N184 Chronic kidney disease, stage 4 (severe): Secondary | ICD-10-CM | POA: Diagnosis not present

## 2017-03-10 DIAGNOSIS — I96 Gangrene, not elsewhere classified: Secondary | ICD-10-CM | POA: Diagnosis not present

## 2017-03-10 DIAGNOSIS — G9340 Encephalopathy, unspecified: Secondary | ICD-10-CM | POA: Diagnosis not present

## 2017-03-10 DIAGNOSIS — I679 Cerebrovascular disease, unspecified: Secondary | ICD-10-CM | POA: Diagnosis not present

## 2017-03-10 DIAGNOSIS — N179 Acute kidney failure, unspecified: Secondary | ICD-10-CM | POA: Diagnosis not present

## 2017-03-11 DIAGNOSIS — N179 Acute kidney failure, unspecified: Secondary | ICD-10-CM | POA: Diagnosis not present

## 2017-03-11 DIAGNOSIS — J9610 Chronic respiratory failure, unspecified whether with hypoxia or hypercapnia: Secondary | ICD-10-CM | POA: Diagnosis not present

## 2017-03-11 DIAGNOSIS — I4891 Unspecified atrial fibrillation: Secondary | ICD-10-CM | POA: Diagnosis not present

## 2017-03-11 DIAGNOSIS — N184 Chronic kidney disease, stage 4 (severe): Secondary | ICD-10-CM | POA: Diagnosis not present

## 2017-03-11 DIAGNOSIS — G9340 Encephalopathy, unspecified: Secondary | ICD-10-CM | POA: Diagnosis not present

## 2017-03-11 DIAGNOSIS — I679 Cerebrovascular disease, unspecified: Secondary | ICD-10-CM | POA: Diagnosis not present

## 2017-03-12 DIAGNOSIS — J9610 Chronic respiratory failure, unspecified whether with hypoxia or hypercapnia: Secondary | ICD-10-CM | POA: Diagnosis not present

## 2017-03-12 DIAGNOSIS — N179 Acute kidney failure, unspecified: Secondary | ICD-10-CM | POA: Diagnosis not present

## 2017-03-12 DIAGNOSIS — N184 Chronic kidney disease, stage 4 (severe): Secondary | ICD-10-CM | POA: Diagnosis not present

## 2017-03-12 DIAGNOSIS — I679 Cerebrovascular disease, unspecified: Secondary | ICD-10-CM | POA: Diagnosis not present

## 2017-03-12 DIAGNOSIS — I4891 Unspecified atrial fibrillation: Secondary | ICD-10-CM | POA: Diagnosis not present

## 2017-03-12 DIAGNOSIS — G9340 Encephalopathy, unspecified: Secondary | ICD-10-CM | POA: Diagnosis not present

## 2017-03-13 DIAGNOSIS — J9610 Chronic respiratory failure, unspecified whether with hypoxia or hypercapnia: Secondary | ICD-10-CM | POA: Diagnosis not present

## 2017-03-13 DIAGNOSIS — N184 Chronic kidney disease, stage 4 (severe): Secondary | ICD-10-CM | POA: Diagnosis not present

## 2017-03-13 DIAGNOSIS — I679 Cerebrovascular disease, unspecified: Secondary | ICD-10-CM | POA: Diagnosis not present

## 2017-03-13 DIAGNOSIS — I4891 Unspecified atrial fibrillation: Secondary | ICD-10-CM | POA: Diagnosis not present

## 2017-03-13 DIAGNOSIS — N179 Acute kidney failure, unspecified: Secondary | ICD-10-CM | POA: Diagnosis not present

## 2017-03-13 DIAGNOSIS — G9340 Encephalopathy, unspecified: Secondary | ICD-10-CM | POA: Diagnosis not present

## 2017-03-14 DIAGNOSIS — I4891 Unspecified atrial fibrillation: Secondary | ICD-10-CM | POA: Diagnosis not present

## 2017-03-14 DIAGNOSIS — N179 Acute kidney failure, unspecified: Secondary | ICD-10-CM | POA: Diagnosis not present

## 2017-03-14 DIAGNOSIS — N184 Chronic kidney disease, stage 4 (severe): Secondary | ICD-10-CM | POA: Diagnosis not present

## 2017-03-14 DIAGNOSIS — J9610 Chronic respiratory failure, unspecified whether with hypoxia or hypercapnia: Secondary | ICD-10-CM | POA: Diagnosis not present

## 2017-03-14 DIAGNOSIS — G9340 Encephalopathy, unspecified: Secondary | ICD-10-CM | POA: Diagnosis not present

## 2017-03-14 DIAGNOSIS — I679 Cerebrovascular disease, unspecified: Secondary | ICD-10-CM | POA: Diagnosis not present

## 2017-03-15 DIAGNOSIS — J9610 Chronic respiratory failure, unspecified whether with hypoxia or hypercapnia: Secondary | ICD-10-CM | POA: Diagnosis not present

## 2017-03-15 DIAGNOSIS — G9340 Encephalopathy, unspecified: Secondary | ICD-10-CM | POA: Diagnosis not present

## 2017-03-15 DIAGNOSIS — N179 Acute kidney failure, unspecified: Secondary | ICD-10-CM | POA: Diagnosis not present

## 2017-03-15 DIAGNOSIS — I679 Cerebrovascular disease, unspecified: Secondary | ICD-10-CM | POA: Diagnosis not present

## 2017-03-15 DIAGNOSIS — N184 Chronic kidney disease, stage 4 (severe): Secondary | ICD-10-CM | POA: Diagnosis not present

## 2017-03-15 DIAGNOSIS — I4891 Unspecified atrial fibrillation: Secondary | ICD-10-CM | POA: Diagnosis not present

## 2017-03-17 DIAGNOSIS — G9340 Encephalopathy, unspecified: Secondary | ICD-10-CM | POA: Diagnosis not present

## 2017-03-17 DIAGNOSIS — N184 Chronic kidney disease, stage 4 (severe): Secondary | ICD-10-CM | POA: Diagnosis not present

## 2017-03-17 DIAGNOSIS — I4891 Unspecified atrial fibrillation: Secondary | ICD-10-CM | POA: Diagnosis not present

## 2017-03-17 DIAGNOSIS — N179 Acute kidney failure, unspecified: Secondary | ICD-10-CM | POA: Diagnosis not present

## 2017-03-17 DIAGNOSIS — J9610 Chronic respiratory failure, unspecified whether with hypoxia or hypercapnia: Secondary | ICD-10-CM | POA: Diagnosis not present

## 2017-03-17 DIAGNOSIS — I679 Cerebrovascular disease, unspecified: Secondary | ICD-10-CM | POA: Diagnosis not present

## 2017-03-18 DIAGNOSIS — I679 Cerebrovascular disease, unspecified: Secondary | ICD-10-CM | POA: Diagnosis not present

## 2017-03-18 DIAGNOSIS — I4891 Unspecified atrial fibrillation: Secondary | ICD-10-CM | POA: Diagnosis not present

## 2017-03-18 DIAGNOSIS — N184 Chronic kidney disease, stage 4 (severe): Secondary | ICD-10-CM | POA: Diagnosis not present

## 2017-03-18 DIAGNOSIS — N179 Acute kidney failure, unspecified: Secondary | ICD-10-CM | POA: Diagnosis not present

## 2017-03-18 DIAGNOSIS — G9340 Encephalopathy, unspecified: Secondary | ICD-10-CM | POA: Diagnosis not present

## 2017-03-18 DIAGNOSIS — J9610 Chronic respiratory failure, unspecified whether with hypoxia or hypercapnia: Secondary | ICD-10-CM | POA: Diagnosis not present

## 2017-03-25 DEATH — deceased

## 2017-03-26 ENCOUNTER — Telehealth (HOSPITAL_COMMUNITY): Payer: Self-pay

## 2017-03-26 NOTE — Telephone Encounter (Signed)
Called to schedule f/u, left message for pt to return call. AW 

## 2017-04-08 ENCOUNTER — Telehealth (HOSPITAL_COMMUNITY): Payer: Self-pay

## 2017-04-08 NOTE — Telephone Encounter (Signed)
Called to schedule f/u, left message for pt to return call. AW 

## 2017-05-07 ENCOUNTER — Telehealth (HOSPITAL_COMMUNITY): Payer: Self-pay

## 2017-05-07 NOTE — Telephone Encounter (Signed)
Called to schedule f/u, left message for pt to return call. AW 

## 2017-12-07 IMAGING — DX DG CHEST 1V PORT
1 series · 1 of 1 positions shown · non-contrast
Comparison: Portable chest x-ray of earlier today

CLINICAL DATA: Tracheostomy placement. Its patient has sustained a
CVA.

EXAM:
PORTABLE CHEST 1 VIEW

[chest ap]
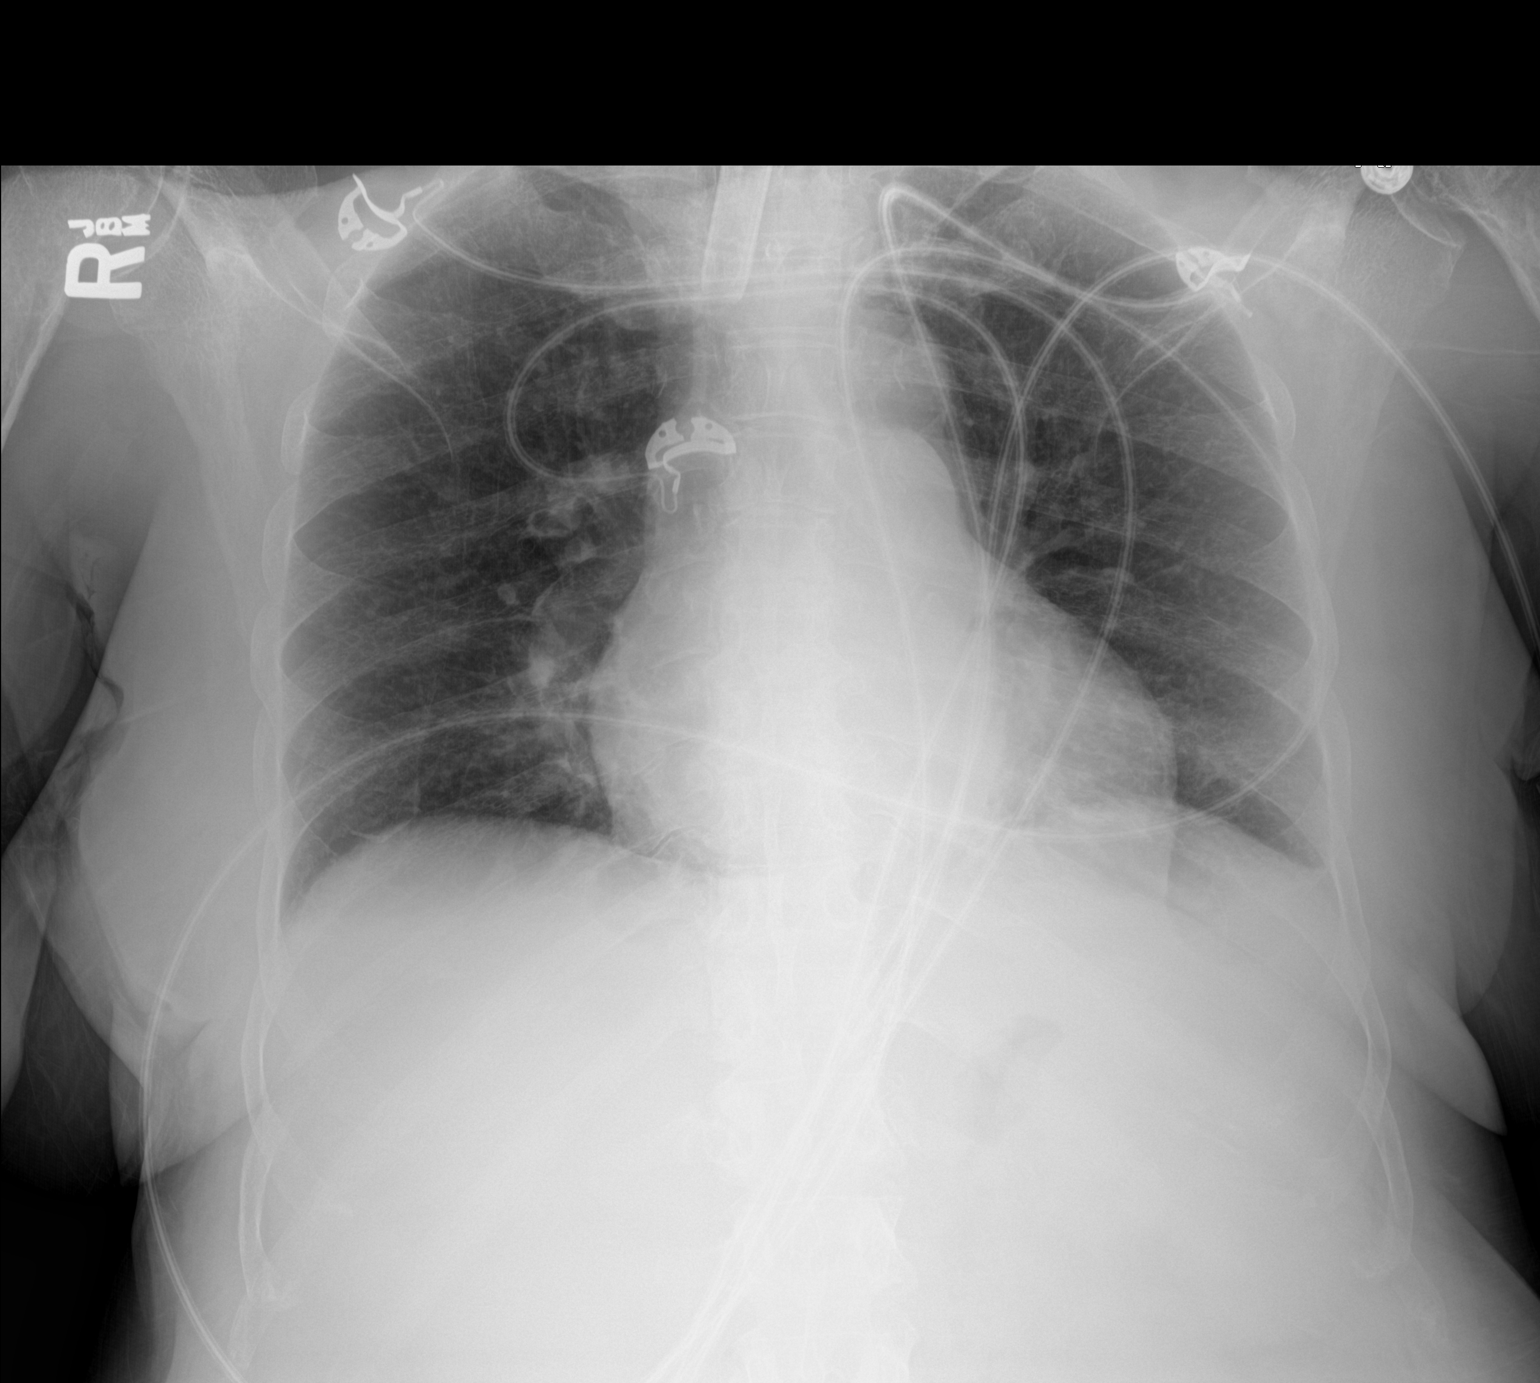

[1 of 1 positions shown; findings below may reference images not displayed]

FINDINGS: The endotracheal tube has been removed and a tracheostomy appliance
placed today. The tip of the tube lies approximately 12 mm below the
inferior margin of the clavicular heads. The lungs are reasonably
well inflated. There is no infiltrate or atelectasis. There is no
pleural effusion. The heart and pulmonary vascularity are normal.
IMPRESSION: No complication following tracheostomy tube placement.

## 2017-12-11 IMAGING — CR DG CHEST 1V PORT
1 series · 1 of 1 positions shown · non-contrast
Comparison: 01/16/2017

CLINICAL DATA: Fever, history of tracheostomy

EXAM:
PORTABLE CHEST 1 VIEW

[AP]
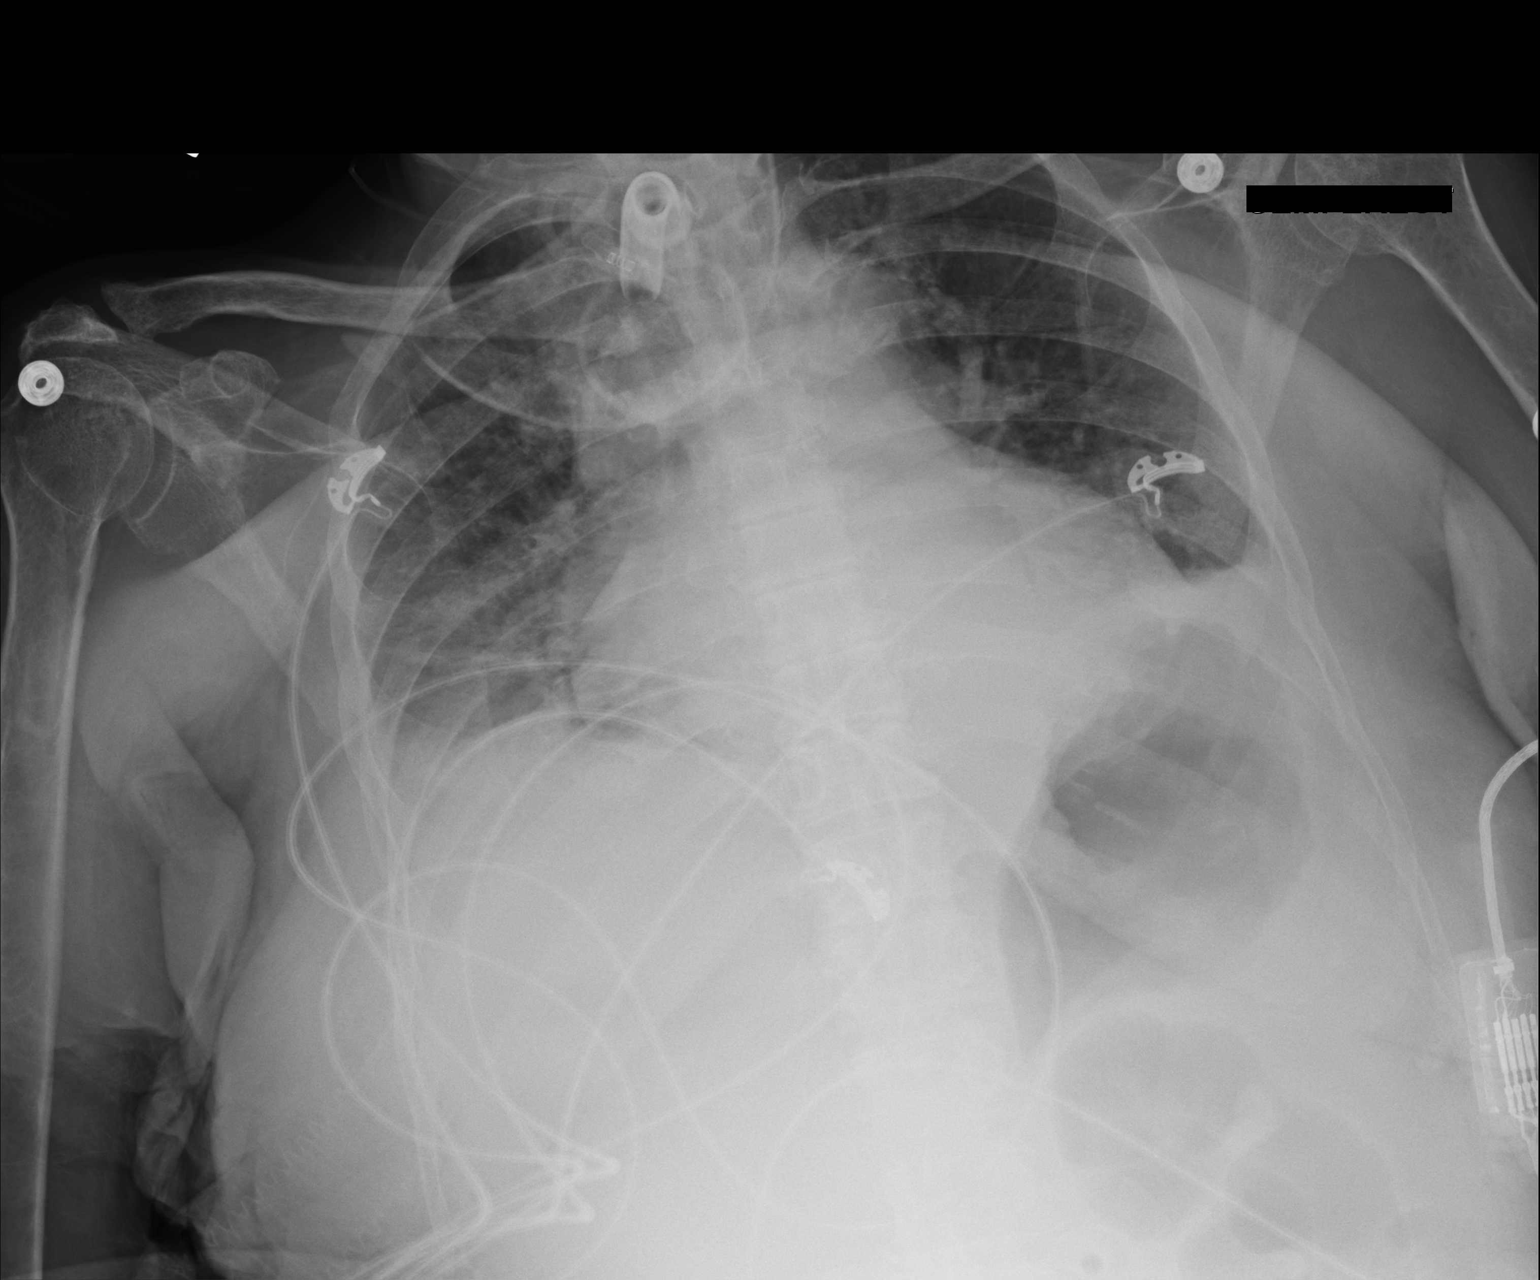

[1 of 1 positions shown; findings below may reference images not displayed]

FINDINGS: Borderline cardiomegaly. Central vascular congestion without
convincing pulmonary edema. Tracheostomy tube in place. Trace
bilateral pleural effusion. Hazy right basilar atelectasis or
infiltrate.
IMPRESSION: Central vascular congestion without pulmonary edema. Borderline
cardiomegaly. Trace bilateral pleural effusion. Hazy right basilar
atelectasis or early infiltrate. Tracheostomy tube in place.

## 2017-12-15 IMAGING — DX DG CHEST 1V PORT
1 series · 1 of 1 positions shown · non-contrast
Comparison: January 20, 2017

CLINICAL DATA: Respiratory distress

EXAM:
PORTABLE CHEST 1 VIEW

[chest ap]
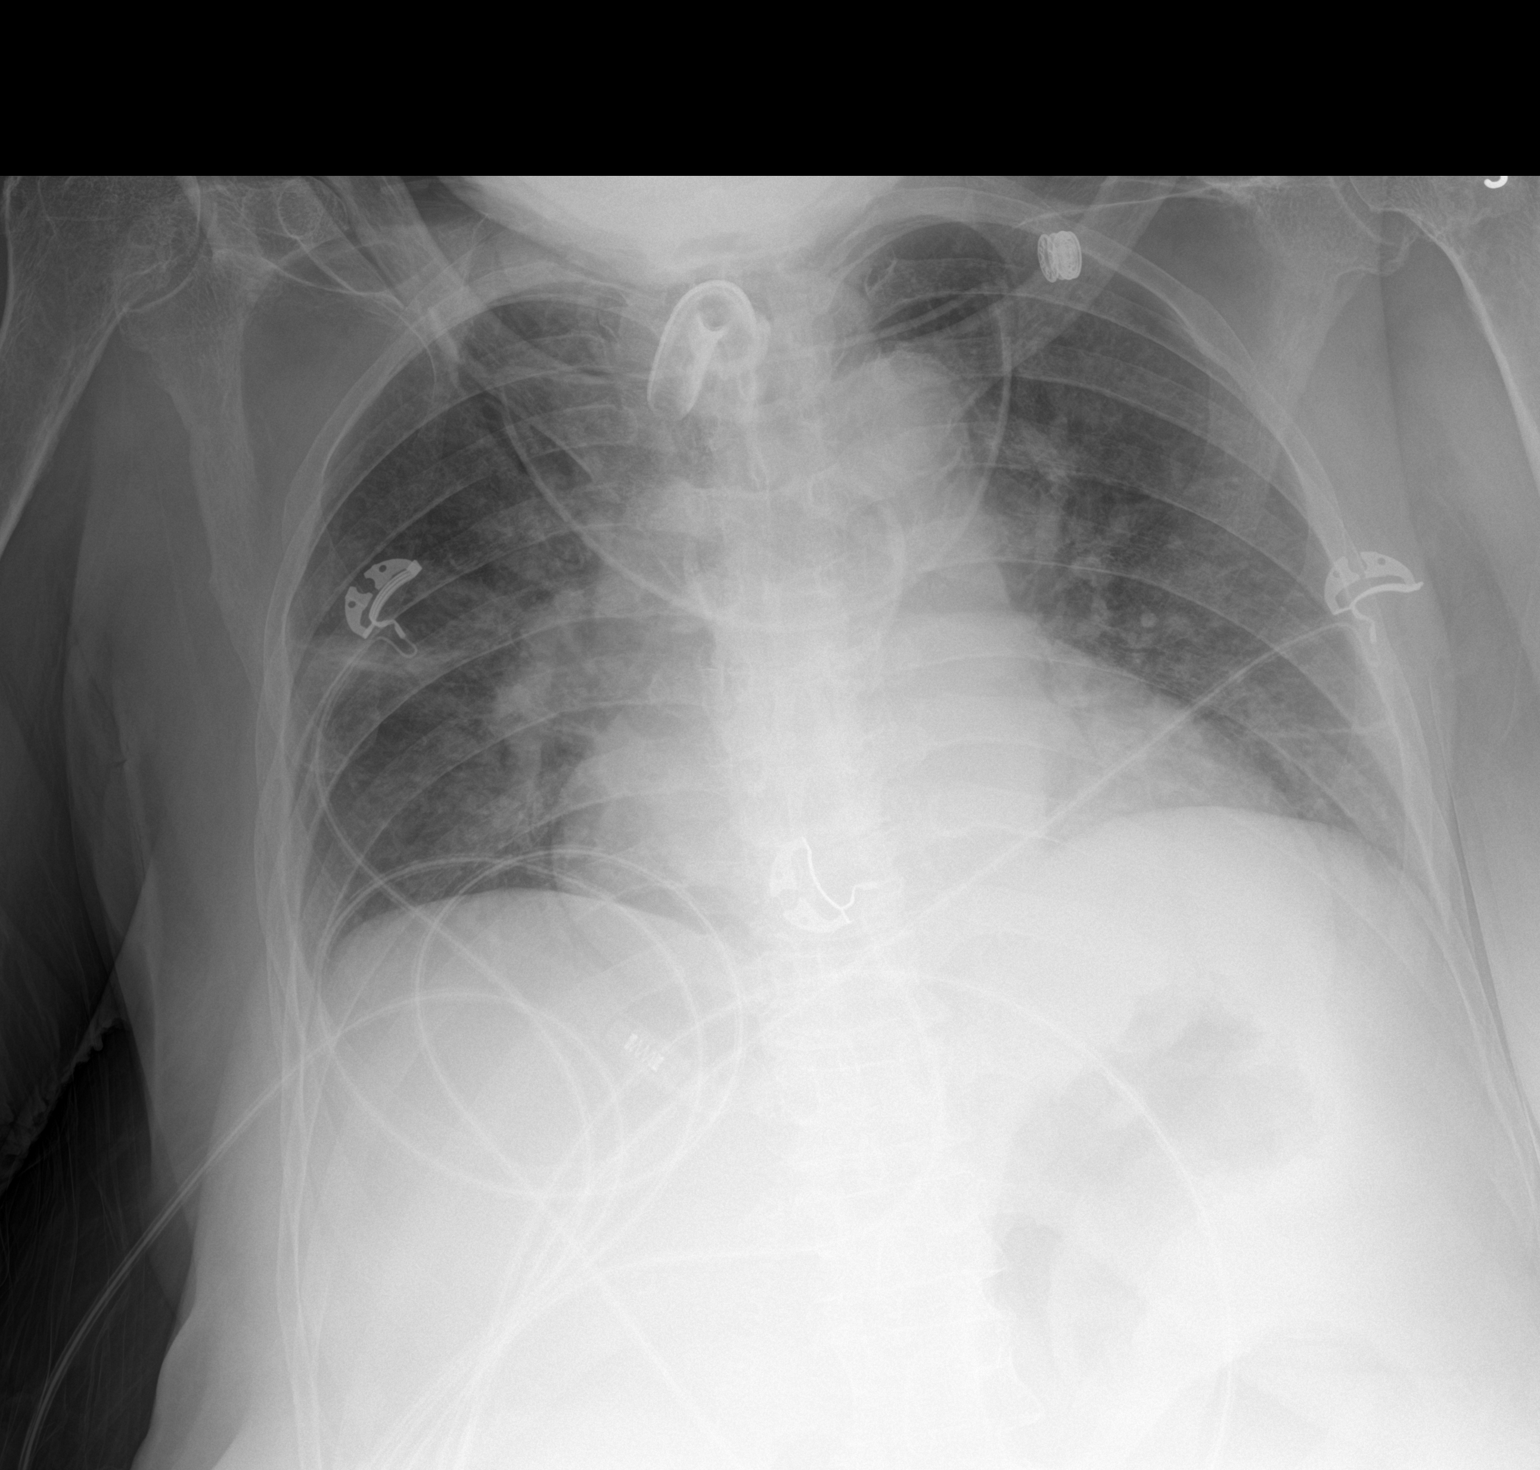

[1 of 1 positions shown; findings below may reference images not displayed]

FINDINGS: Tracheostomy catheter tip is 2.7 cm above the carina. No
pneumothorax. There has been partial clearing of airspace
consolidation from the right base. Atelectatic appearing opacity is
noted in the right mid lung. There is also mild atelectasis in the
left base. No new opacity evident. Heart is mildly enlarged with
pulmonary vascularity within normal limits. No adenopathy. There is
atherosclerotic calcification in the aorta. No bone lesions peer
IMPRESSION: Tracheostomy catheter as described. No pneumothorax. Partial
clearing right base patchy airspace opacity. Atelectatic change
remains in the right mid lung as well as in the left base. No new
opacity. Stable cardiac prominence. Aortic atherosclerosis present.

## 2017-12-16 IMAGING — CT CT ABDOMEN W/O CM
2 of 4 series · 14 of 46 positions shown, 16 images · non-contrast
Comparison: Abdominal MRI 12/09/2016

CLINICAL DATA: Recent PEG placement and removal by patient. A Foley
catheter has been placed. Evaluate PEG site.

EXAM:
CT ABDOMEN WITHOUT CONTRAST
TECHNIQUE: Multidetector CT imaging of the abdomen was performed following the
standard protocol without IV contrast.

[Series 201: routine, idose (2) · axial · 0.68mm/px · z∈[+152,+402]mm · 11 of 59 slices shown, 13 images]
[im 6/59  soft-tissue]
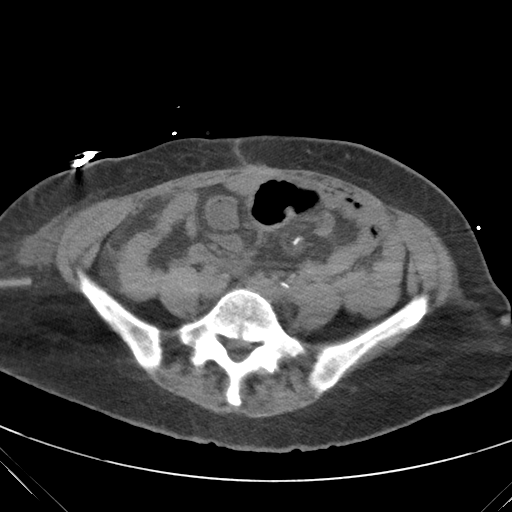
[im 6/59  bone]
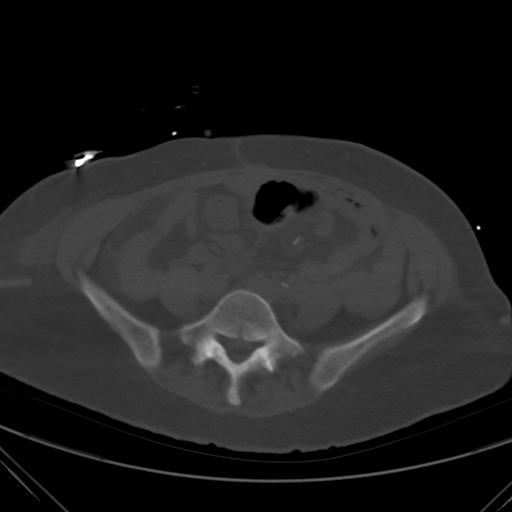
[im 11/59  soft-tissue]
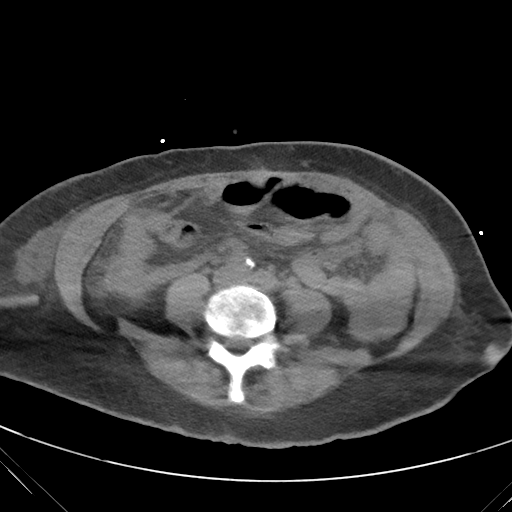
[im 16/59  soft-tissue]
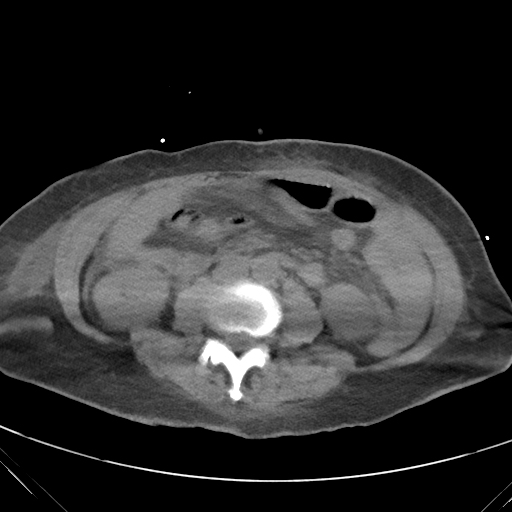
[im 21/59  soft-tissue]
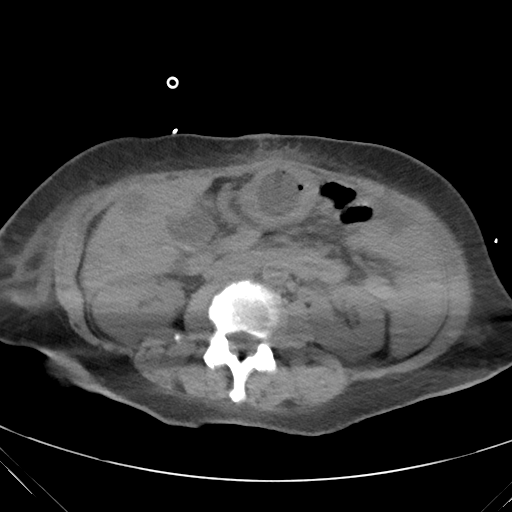
[im 26/59  soft-tissue]
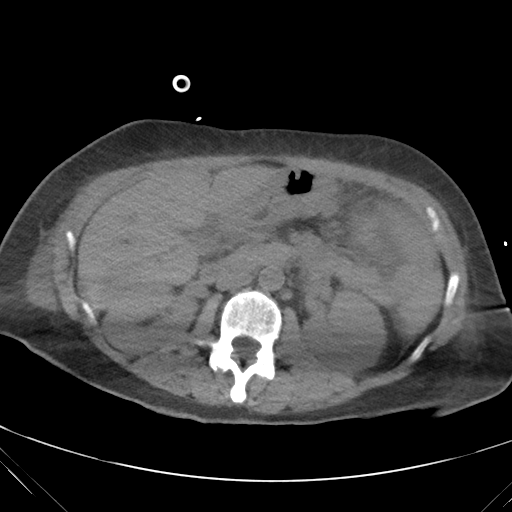
[im 31/59  soft-tissue]
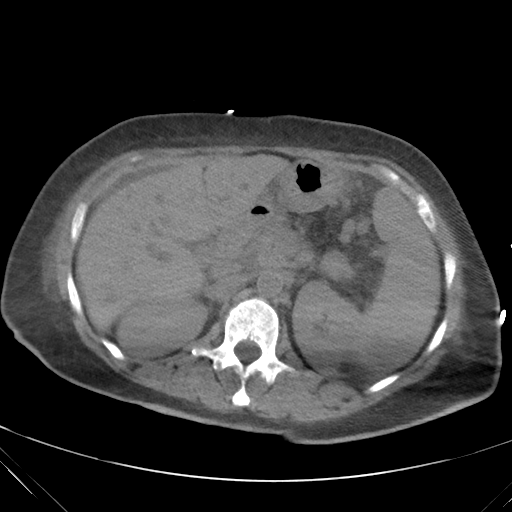
[im 36/59  soft-tissue]
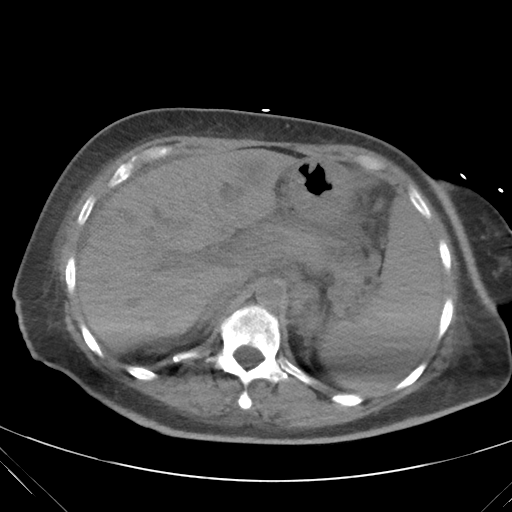
[im 41/59  soft-tissue]
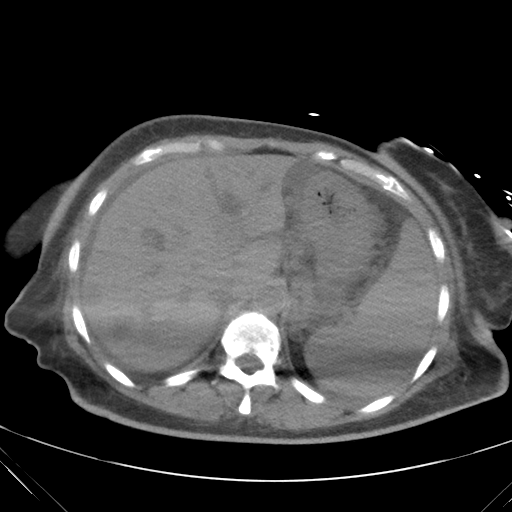
[im 46/59  soft-tissue]
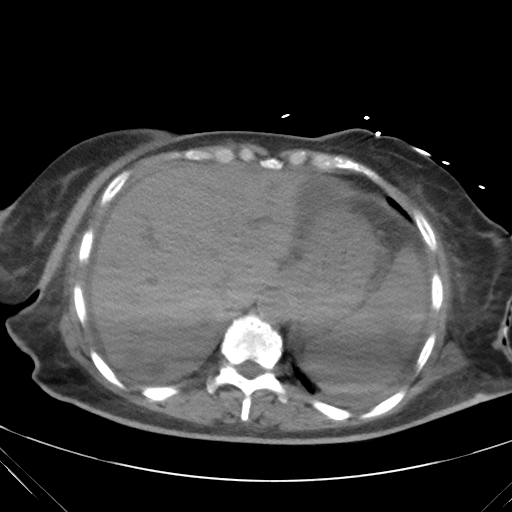
[im 46/59  bone]
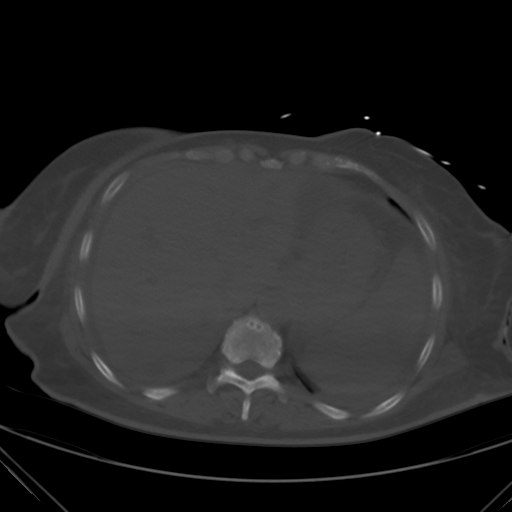
[im 51/59  soft-tissue]
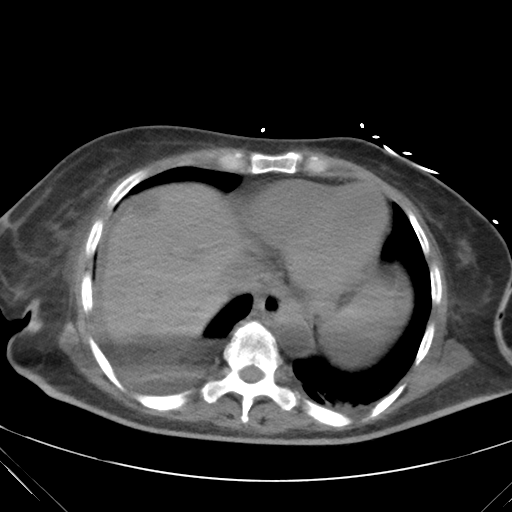
[im 56/59  soft-tissue]
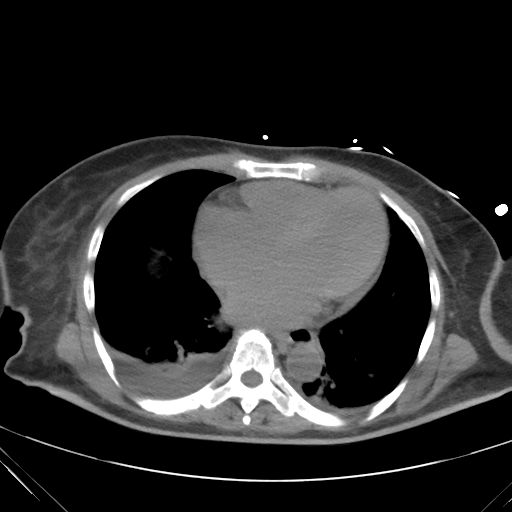

[Series 203: coronals, idose (2) · coronal · 0.45mm/px · 3 of 129 slices shown]
[im 43/129  soft-tissue]
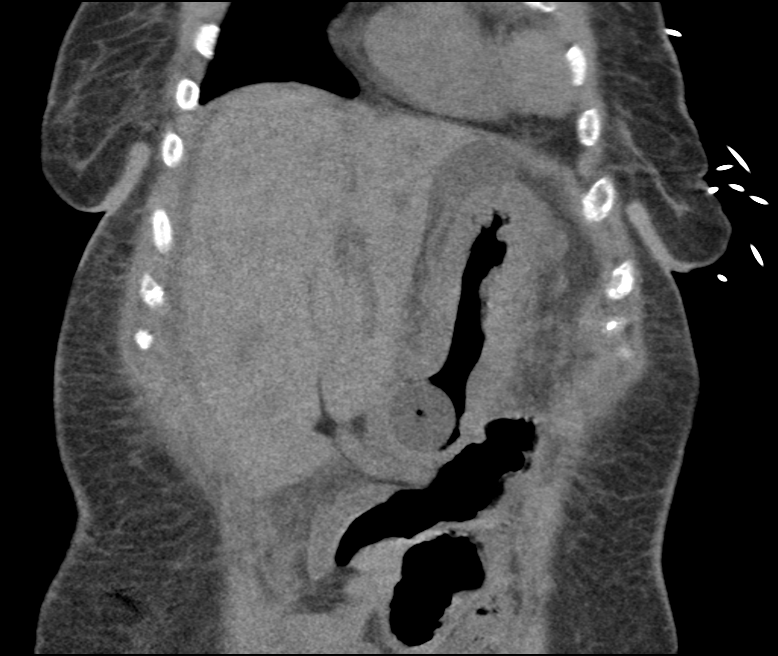
[im 57/129  soft-tissue]
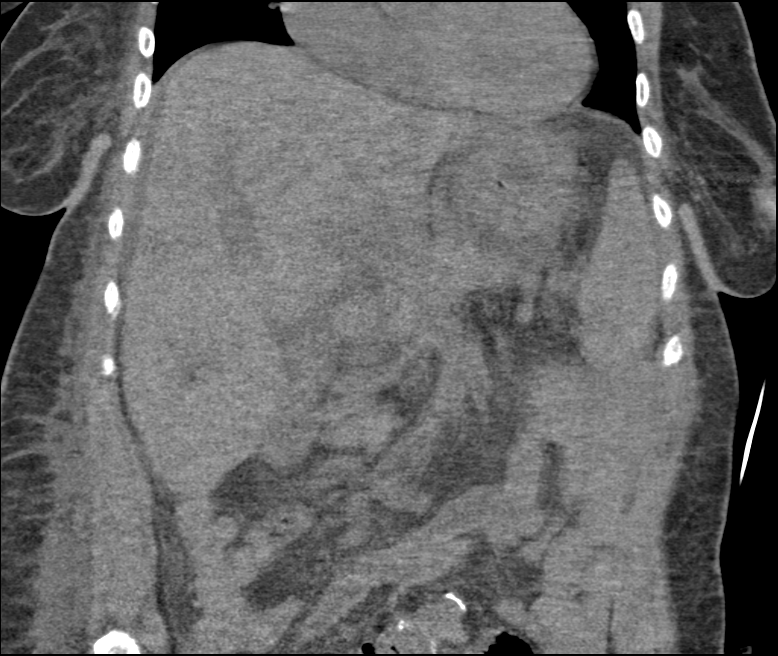
[im 72/129  soft-tissue]
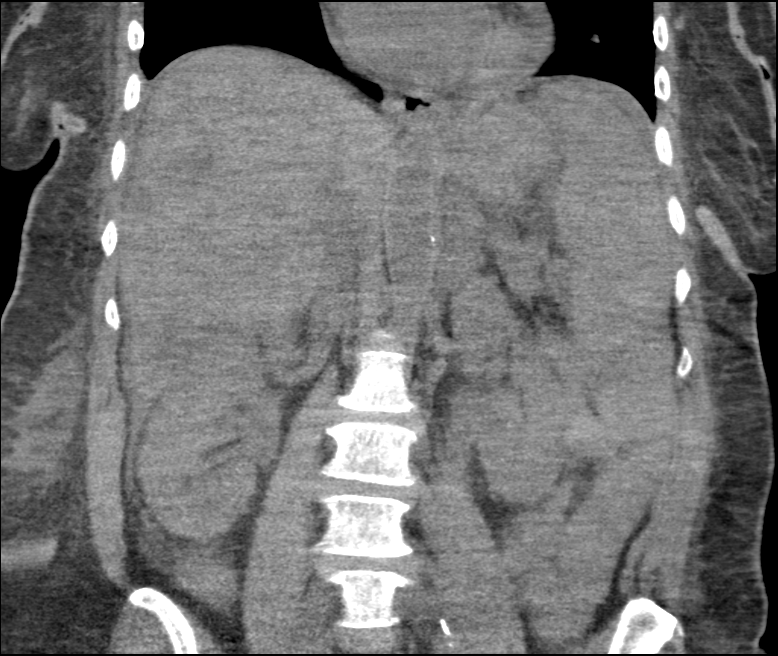

[14 of 46 positions shown; findings below may reference images not displayed]

FINDINGS: Lower chest: Small pleural effusions, greater on the right.
Atelectasis at the bases.

Hepatobiliary: Multiple hepatic metastases. Cholelithiasis without
signs of acute cholecystitis.

Pancreas: Negative

Spleen: Splenomegaly. When accounting for streak artifact there is
no definite focal abnormality.

Adrenals/Urinary Tract: No definite renal abnormality. No
hydronephrosis.

Stomach/Bowel: Percutaneous gastrostomy site has been cannulated
with a Foley catheter which is in good position. The stomach is
appropriately apposed to the abdominal wall. Abdominal wall
reticulation around the gastrostomy site, with minimal gas, not
unexpected given the history. There is no evidence of abdominal wall
fluid collection. No dissecting soft tissue gas.

Vascular/Lymphatic: Trace ascites. No pneumoperitoneum. No
visualized bowel obstruction.

Other: Negative

Musculoskeletal: No acute finding
IMPRESSION: 1. Recent percutaneous gastrostomy with Foley in place of the
displaced catheter. The Foley is in good position in the stomach is
apposed to the abdominal wall. Mild soft tissue reticulation and gas
at the PEG site without visible fluid collection.
2. Small ascites without pneumoperitoneum.

## 2017-12-16 IMAGING — DX DG CHEST 1V PORT
1 series · 1 of 1 positions shown · non-contrast
Comparison: Chest radiograph from one day prior.

CLINICAL DATA: Respiratory failure

EXAM:
PORTABLE CHEST 1 VIEW

[chest ap]
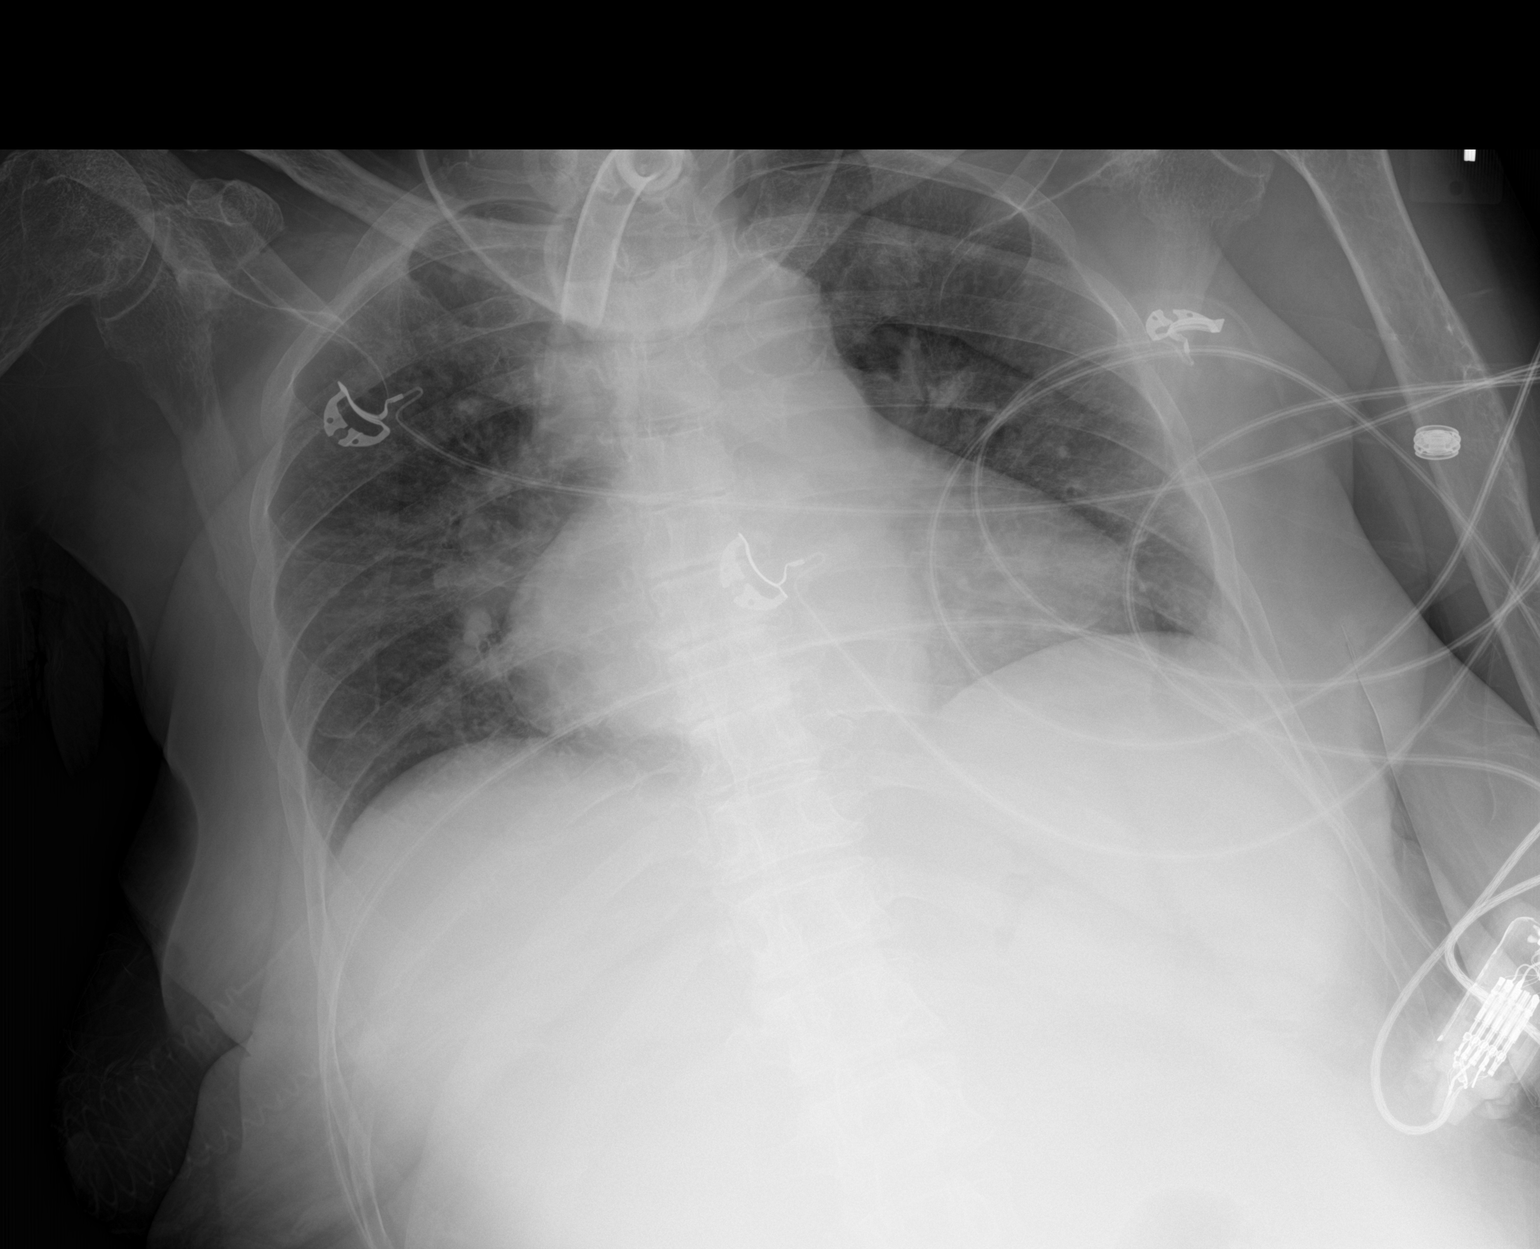

[1 of 1 positions shown; findings below may reference images not displayed]

FINDINGS: Tracheostomy tube tip overlies the tracheal air column 3.2 cm above
the carina. Stable cardiomediastinal silhouette with mild
cardiomegaly and aortic atherosclerosis. No pneumothorax. No pleural
effusion. Mild hazy right parahilar lung opacity appears stable. No
new consolidative airspace disease.
IMPRESSION: 1. Well-positioned tracheostomy tube.
2. Stable mild cardiomegaly. Stable mild hazy right parahilar lung
opacity, favor atelectasis and/or mild pulmonary edema.
3. Aortic atherosclerosis.
